# Patient Record
Sex: Male | Born: 1965 | Race: Black or African American | Hispanic: No | Smoking: Never smoker
Health system: Southern US, Community
[De-identification: ages and names within clinical notes are randomized; demographics above are authoritative.]

## PROBLEM LIST (undated history)

## (undated) DIAGNOSIS — N189 Chronic kidney disease, unspecified: Secondary | ICD-10-CM

## (undated) DIAGNOSIS — I10 Essential (primary) hypertension: Secondary | ICD-10-CM

## (undated) DIAGNOSIS — G8929 Other chronic pain: Secondary | ICD-10-CM

## (undated) DIAGNOSIS — E1140 Type 2 diabetes mellitus with diabetic neuropathy, unspecified: Secondary | ICD-10-CM

## (undated) DIAGNOSIS — R011 Cardiac murmur, unspecified: Secondary | ICD-10-CM

## (undated) DIAGNOSIS — E1165 Type 2 diabetes mellitus with hyperglycemia: Secondary | ICD-10-CM

## (undated) DIAGNOSIS — M1990 Unspecified osteoarthritis, unspecified site: Secondary | ICD-10-CM

## (undated) HISTORY — DX: Type 2 diabetes mellitus with hyperglycemia: E11.65

## (undated) HISTORY — DX: Unspecified osteoarthritis, unspecified site: M19.90

## (undated) HISTORY — PX: MANDIBLE FRACTURE SURGERY: SHX706

## (undated) HISTORY — DX: Essential (primary) hypertension: I10

## (undated) HISTORY — DX: Type 2 diabetes mellitus with diabetic neuropathy, unspecified: E11.40

## (undated) HISTORY — DX: Other chronic pain: G89.29

## (undated) HISTORY — PX: PROSTATE SURGERY: SHX751

## (undated) NOTE — *Deleted (*Deleted)
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 * Outpatient ESA/iron orders: Mircera 200 (150 on 10/27) * Last Tsat 29% 05/12/20 - has received 2/5 doses of Venofer 100 per outpt records  * Last doses of Aranesp given on 11/17 * Hgb 8 on 11/18, next dose due 11/24  Ferric citrate 630 TID, phos is 4.0, CoCa 8.8 11/18, no pth   Last HD 11/17 (4h 450, 1900 UF), next 11/19  (had extra session 11/16 d/t shorter sessions from seizures)  Pulmonary Hematology / Oncology PTA Medication Issues Best Practices   A/P  ESRD, Seizure w/ HD - unsure etiology, VSS / slightly hypertensive during HD here, CBGs wnl, EEG neg  Anemia - FOBT +, F/u with colonoscopy (last showed mod hemorrhoids)  - transfuse <7  Hypoglycemia - CBG 62 11/17 PM, continue to monitor - may need to adj basal

---

## 2001-02-24 ENCOUNTER — Emergency Department (HOSPITAL_COMMUNITY): Admission: EM | Admit: 2001-02-24 | Discharge: 2001-02-24 | Payer: Self-pay | Admitting: *Deleted

## 2001-03-01 ENCOUNTER — Emergency Department (HOSPITAL_COMMUNITY): Admission: EM | Admit: 2001-03-01 | Discharge: 2001-03-01 | Payer: Self-pay | Admitting: *Deleted

## 2001-03-02 ENCOUNTER — Encounter (INDEPENDENT_AMBULATORY_CARE_PROVIDER_SITE_OTHER): Payer: Self-pay | Admitting: Specialist

## 2001-03-02 ENCOUNTER — Encounter: Payer: Self-pay | Admitting: Emergency Medicine

## 2001-03-02 ENCOUNTER — Inpatient Hospital Stay (HOSPITAL_COMMUNITY): Admission: EM | Admit: 2001-03-02 | Discharge: 2001-03-12 | Payer: Self-pay | Admitting: Emergency Medicine

## 2001-03-06 ENCOUNTER — Encounter: Payer: Self-pay | Admitting: Family Medicine

## 2001-03-21 ENCOUNTER — Encounter: Admission: RE | Admit: 2001-03-21 | Discharge: 2001-03-21 | Payer: Self-pay | Admitting: Family Medicine

## 2002-09-19 ENCOUNTER — Emergency Department (HOSPITAL_COMMUNITY): Admission: EM | Admit: 2002-09-19 | Discharge: 2002-09-19 | Payer: Self-pay | Admitting: Emergency Medicine

## 2003-01-18 ENCOUNTER — Emergency Department (HOSPITAL_COMMUNITY): Admission: EM | Admit: 2003-01-18 | Discharge: 2003-01-18 | Payer: Self-pay | Admitting: Emergency Medicine

## 2004-02-26 ENCOUNTER — Ambulatory Visit: Payer: Self-pay | Admitting: Family Medicine

## 2004-02-26 ENCOUNTER — Ambulatory Visit: Payer: Self-pay | Admitting: *Deleted

## 2004-10-04 ENCOUNTER — Ambulatory Visit: Payer: Self-pay | Admitting: Family Medicine

## 2005-01-24 ENCOUNTER — Ambulatory Visit: Payer: Self-pay | Admitting: Family Medicine

## 2005-03-21 ENCOUNTER — Ambulatory Visit: Payer: Self-pay | Admitting: Family Medicine

## 2005-04-26 ENCOUNTER — Ambulatory Visit: Payer: Self-pay | Admitting: Family Medicine

## 2005-08-28 ENCOUNTER — Emergency Department (HOSPITAL_COMMUNITY): Admission: EM | Admit: 2005-08-28 | Discharge: 2005-08-29 | Payer: Self-pay | Admitting: Emergency Medicine

## 2005-09-14 ENCOUNTER — Ambulatory Visit: Payer: Self-pay | Admitting: Family Medicine

## 2005-09-19 ENCOUNTER — Ambulatory Visit (HOSPITAL_COMMUNITY): Admission: RE | Admit: 2005-09-19 | Discharge: 2005-09-19 | Payer: Self-pay | Admitting: Family Medicine

## 2006-10-02 ENCOUNTER — Ambulatory Visit: Payer: Self-pay | Admitting: Family Medicine

## 2006-11-14 ENCOUNTER — Ambulatory Visit: Payer: Self-pay | Admitting: Family Medicine

## 2007-03-14 ENCOUNTER — Encounter (INDEPENDENT_AMBULATORY_CARE_PROVIDER_SITE_OTHER): Payer: Self-pay | Admitting: *Deleted

## 2007-09-17 ENCOUNTER — Ambulatory Visit: Payer: Self-pay | Admitting: Family Medicine

## 2007-09-17 LAB — CONVERTED CEMR LAB: Microalb, Ur: 9.8 mg/dL — ABNORMAL HIGH (ref 0.00–1.89)

## 2008-01-03 ENCOUNTER — Ambulatory Visit: Payer: Self-pay | Admitting: Family Medicine

## 2008-03-06 ENCOUNTER — Ambulatory Visit: Payer: Self-pay | Admitting: Family Medicine

## 2008-03-06 LAB — CONVERTED CEMR LAB
ALT: 14 units/L (ref 0–53)
AST: 15 units/L (ref 0–37)
Albumin: 4.4 g/dL (ref 3.5–5.2)
Alkaline Phosphatase: 65 units/L (ref 39–117)
BUN: 14 mg/dL (ref 6–23)
Basophils Absolute: 0 10*3/uL (ref 0.0–0.1)
Basophils Relative: 1 % (ref 0–1)
CO2: 25 meq/L (ref 19–32)
Calcium: 9.7 mg/dL (ref 8.4–10.5)
Chloride: 104 meq/L (ref 96–112)
Cholesterol: 202 mg/dL — ABNORMAL HIGH (ref 0–200)
Creatinine, Ser: 1.21 mg/dL (ref 0.40–1.50)
Eosinophils Absolute: 0 10*3/uL (ref 0.0–0.7)
Eosinophils Relative: 1 % (ref 0–5)
Glucose, Bld: 144 mg/dL — ABNORMAL HIGH (ref 70–99)
HCT: 41.7 % (ref 39.0–52.0)
HDL: 50 mg/dL (ref >39)
Hemoglobin: 12.9 g/dL — ABNORMAL LOW (ref 13.0–17.0)
LDL Cholesterol: 138 mg/dL — ABNORMAL HIGH (ref 0–99)
Lymphocytes Relative: 31 % (ref 12–46)
Lymphs Abs: 1.7 10*3/uL (ref 0.7–4.0)
MCHC: 30.9 g/dL (ref 30.0–36.0)
MCV: 74.3 fL — ABNORMAL LOW (ref 78.0–100.0)
Monocytes Absolute: 0.7 10*3/uL (ref 0.1–1.0)
Monocytes Relative: 13 % — ABNORMAL HIGH (ref 3–12)
Neutro Abs: 3.1 10*3/uL (ref 1.7–7.7)
Neutrophils Relative %: 55 % (ref 43–77)
PSA: 2.22 ng/mL (ref 0.10–4.00)
Platelets: 259 10*3/uL (ref 150–400)
Potassium: 4.4 meq/L (ref 3.5–5.3)
RBC: 5.61 M/uL (ref 4.22–5.81)
RDW: 14.6 % (ref 11.5–15.5)
Sodium: 140 meq/L (ref 135–145)
Total Bilirubin: 0.3 mg/dL (ref 0.3–1.2)
Total CHOL/HDL Ratio: 4
Total Protein: 7.4 g/dL (ref 6.0–8.3)
Triglycerides: 68 mg/dL (ref <150)
VLDL: 14 mg/dL (ref 0–40)
Vit D, 1,25-Dihydroxy: 24 — ABNORMAL LOW (ref 30–89)
WBC: 5.5 10*3/uL (ref 4.0–10.5)

## 2010-07-29 NOTE — Miscellaneous (Signed)
Summary: VIP  Patient: Jeremiah [name] Note: All result statuses are Final unless otherwise noted.  Tests: (1) VIP (Medications)   LLIMPORTMEDS              "Result Below..."       RESULT: NEURONTIN CAPS 400 MG*TAKE ONE CAPSULE IN THE MORNING AND TWO AT BEDTIME*11/14/2006*Last Refill: Unknown*[id number]*******   LLIMPORTMEDS              "Result Below..."       RESULT: LANTUS SOLN 100 UNIT/ML*INJECT 50 UNITS UNDER SKIN TWO TIMES A DAY*10/02/2006*Last Refill: 12/22/2006*[id number]*******   LLIMPORTMEDS              "Result Below..."       RESULT: GLUCOPHAGE TABS 1000 MG*TAKE ONE TABLET TWICE DAILY WITH MEALS*10/02/2006*Last Refill: 12/22/2006*[id number]*******   LLIMPORTMEDS              "Result Below..."       RESULT: ACTOS TABS 45 MG*TAKE ONE TABLET ONCE A DAY WITH A MEAL*10/02/2006*Last Refill: 12/22/2006*[id number]*******   LLIMPORTALLS              NKDA***  Note: An exclamation mark (!) indicates a result that was not dispersed into the flowsheet. Document Creation Date: 04/26/2007 2:59 PM _______________________________________________________________________  (1) Order result status: Final Collection or observation date-time: 03/14/2007 Requested date-time: 03/14/2007 Receipt date-time:  Reported date-time: 03/14/2007 Referring Physician:   Ordering Physician:   Specimen Source:  Source: Charlyne Mom Order Number:  Lab site:

## 2010-11-12 NOTE — Consult Note (Signed)
[place]. [facility]  Patient:    Jeremiah [name], Jeremiah [name] Visit Number: [id number] MRN: [id number]          Service Type: EMS Location: [facility] Attending Physician:  Burnard Hawthorne Dictated by:   Lucina Mellow. Terance Hart, M.D. Proc. Date: 03/07/01 Admit Date:  03/01/2001 Discharge Date: 03/01/2001   CC:         Lauro Regulus, M.D.   Consultation Report  REASON FOR CONSULTATION:  This 45 year old black male was diagnosed in the last year as having diabetes.  He has now had several days of some chills, fever, perineal pain, and some pain with voiding.  He denies any previous voiding problems or prostatitis.  He has been treated with Septra DS and subsequently been on Ancef in the hospital but has just been switched to Tequin 400 mg a day this afternoon.  He was also complaining about perineal pain and tenderness.  He had a blood culture on March 02, 2001 that showed Staphylococcus aureus sensitive to Levaquin and therefore should be sensitive to Tequin.  It also showed some sensitivity to Septra.  Urine culture grew Staphylococcus aureus with a similar sensitivities.  That was on March 02, 2001.  Urinalysis on that same day just showed 3 to 6 white cells.  In the last few days, his temperatures have been no higher than 99.6.  A CT scan yesterday showed an enlarged prostate with some suspected abscess cavities within it.  PHYSICAL EXAMINATION:  ABDOMEN:  The abdomen is soft and nontender.  GENITALIA:  Penis, urethral meatus, testicles, and epididymis are unremarkable.  There are no perineal masses.  RECTAL:  Reveals an enlarged and tender prostate.  I cannot really feel fluctuants.  IMPRESSION:  Severe prostatitis with suspected abscess formation.  He is now on appropriate antibiotics.  The question is whether he will need drainage of these abscesses and if so probably transurethral approach would be most appropriate.  I will investigate this option  further and follow this gentleman with you. Dictated by:   Lucina Mellow. Terance Hart, M.D. Attending Physician:  Burnard Hawthorne DD:  03/07/01 TD:  03/07/01 Job: [id number]

## 2010-11-12 NOTE — Discharge Summary (Signed)
[place]. [facility]  Patient:    Jeremiah [name], Jeremiah [name] Visit Number: [id number] MRN: [id number]          Service Type: MED Location: [phone] Attending Physician:  Fayrene Fearing Dictated by:   Ardine Eng, M.D. Admit Date:  03/02/2001 Disc. Date: 03/12/01   CC:         Gay Filler. Leward Quan, M.D., [facility]   Discharge Summary  DISCHARGE DIAGNOSIS:  Prostate abscess and bacteremia with Staphylococcus aureus.  SECONDARY DIAGNOSIS:  Diabetes mellitus.  PROCEDURES: 1. The patient had blood cultures x 2 done on March 02, 2001 which both    were positive for Staphylococcus aureus.  The patient also had a urine    culture done on March 02, 2001 which was no growth. 2. The patient had a chest x-ray done on March 02, 2001 which showed no    acute cardiopulmonary disease. 3. The patient had a CT of his abdomen and pelvis with contrast medium on    March 06, 2001 which showed constipation.  Otherwise, negative    abdominal CT.  Pelvic CT showed extensive abscess of the prostate gland. 4. The patient had a transurethral resection incision of the prostate done on    March 09, 2001 without any postoperative or intraoperative    complications.  This procedure was done by Dr. Terance Hart from urology. 5. The patient had a left knee aspiration done on March 05, 2001 which    showed minimal fluid return and was then therefore not sent for any tests. 6. The patient had a transthoracic echocardiogram done on March 06, 2001    which showed:    a. Left systolic function normal.    b. Ejection fraction of 55-65%.    c. No left ventricular region wall motion abnormalities.    d. No valvular vegetations. 7. The patient had a transesophageal echocardiogram done on March 07, 2001    which showed:    a. Aortic valve minimally thickened, no obvious vegetation, trace aortic       insufficiency.    b. Normal thoracic aorta.    c. There  was no significant mitral valvular regurgitation.    d. Left atrial appendage size was normal, no left atrial appendage       thrombus, no left atrial thrombus.    e. No pulmonic valve stenosis, no significant pulmonic regurgitation.    f. There was trivial tricuspid valvular regurgitation.  CONSULTATIONS:  The patient was seen by Dr. Terance Hart from urology.  The patient was also see by Dr. Johnnye Sima from infectious disease.  HOSPITAL COURSE:  This is a 45 year old African-American male who presented with a history of 10 days of fevers and chills that were worse at night.  The patient was seen in the emergency department five days and three days prior to admission with similar complaints.  Tests from those visits showed a positive blood culture x 1 with Staphylococcus aureus.  The patient also had complaints of left knee pain and rectal pain.  While the patient was in the hospital, he was worked up with repeat blood cultures and urine cultures which showed Staphylococcus aureus in both blood cultures but no growth in the urine cultures.  He was empirically started on IV antibiotics including Ancef and later was started on oral Tequin.  The patient then underwent a CT of the abdomen and pelvis with IV contrast showing a prostatic abscess.  Dr. Terance Hart from urology was then consulted, which  did a transurethral resection incision of the prostate, as above.  The patient reported immediate relief of his perineal pain.  It yielded minimal fluid. Postoperatively, the patient remained well with minimal complaints of pain or urinary complications.  The patient had adequate urine output after his Foley was removed.  The patient remained afebrile throughout his postoperative course and was tolerating p.o. well with stable vital signs.  He was then discharged to home with follow-up as below.  DISCHARGE MEDICATIONS: 1. Keflex 500 mg p.o. q.i.d. x three weeks. 2. Flomax 0.4 mg p.o. q.d. 3. Tylenol  No.3 one to two tablets p.o. q.4h. p.r.n. pain. 4. Lantus injected 20 units subcu q.h.s. 5. Glucophage 500 mg p.o. b.i.d. 6. Glucotrol XL 10 mg p.o. q.d.  ACTIVITY:  The patient may return to work on March 28, 2001 with light activity until then.  The patient was told not to have sex for three weeks.  DIET:  He is to told avoid sweets and foods with high sugars.  He is also told to avoid fat, fried, fast foods.  SPECIAL INSTRUCTIONS:  He was instructed to call his doctor or return to the hospital for inability to urinate, fever, chest pain, shortness of breath, blood in the urine, or for blood sugars less than 70 or greater than 250.  FOLLOW-UP:  He is to return to [facility], on March 13, 2001, at 8 a.m. for recertification.  He was also made an appointment to see Dr. Leward Quan at [facility] on March 14, 2001 at 9:45 a.m.  He was told to call [phone] if he could not make either of those appointments.  He was also made an appointment to see Dr. Terance Hart on October 1 at 4 p.m.  He is to call his office at [phone] if he cannot make that appointment. Dictated by:   Ardine Eng, M.D. Attending Physician:  Fayrene Fearing DD:  03/12/01 TD:  03/12/01 Job: [id number]

## 2010-11-12 NOTE — H&P (Signed)
[place]. [facility]  Patient:    Jeremiah [name], Jeremiah [name] Visit Number: [id number] MRN: [id number]          Service Type: EMS Location: [facility] Attending Physician:  Burnard Hawthorne Dictated by:   Lauro Regulus, M.D. Admit Date:  03/01/2001 Discharge Date: 03/01/2001                           History and Physical  SERVICE:  [facility].  CHIEF COMPLAINT:  Fever.  HISTORY OF PRESENT ILLNESS:  Mr. Jeremiah [name] is a 45 year old patient of [place] who presents to [facility] emergency department after being asked to come in with a 10-day history of fever and chills that is worse at night.  He was seen in the emergency department five years ago and two days ago.  At his last visit he had blood cultures drawn that grew gram positive cocci in clusters in 1/1 blood cultures.  He had been started on Septra DS five days ago - he has been taking this.  He does complain of testicular pain, rectal pain, and left knee pain that started five days ago after the fevers.  He denies any cough or sputum.  No chest pain, no nausea or vomiting.  He denies any trauma to his knees.  He denies previous prostatitis.  He has had gonorrhea in the past.  He denies any IV drug abuse.  He denies a history of endocarditis.  He denies any recent pharyngitis, ear pain, or sinus pain.  He denies previous history of fevers.  PAST MEDICAL HISTORY:  Diabetes mellitus type 2.  MEDICATIONS: 1. Glucotrol XL 10 mg q.d. 2. Glucophage - dose unknown.  ALLERGIES:  No known drug allergies.  SOCIAL HISTORY:  Patient lives in [place].  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  He denies alcohol abuse.  He does smoke two packs of cigarettes a day.  FAMILY HISTORY:  Noncontributory.  REVIEW OF SYSTEMS:  He does complain of a seven-pound weight loss over the past month and he states he has had decreased appetite.  No chest pain, no cough, no sputum.  No nausea, vomiting,  or diarrhea.  No skin rash.  No headaches.  No history of sickle cell disease.  No blurry vision.  No sore throat or sinus pain.  No dysuria, no urinary frequency.  PHYSICAL EXAMINATION:  VITAL SIGNS:  Noted.  GENERAL:  Pleasant, well-developed, well-nourished male in no distress.  He is alert and oriented x 3.  HEENT:  Scalp without nodules.  PERRL, EOMI.  Nose without discharge. Oropharynx nonerythematous, no exudate.  No tonsil hypertrophy.  NECK:  No lymphadenopathy, no thyromegaly.  CORONARY:  S1, S2, regular rate and rhythm.  There is a 2/6 systolic murmur heard best at the left lower sternal border without radiation.  LUNGS:  Clear to auscultation bilaterally.  ABDOMEN:  Soft, nontender, nondistended, with bowel sounds.  GENITOURINARY:  Testicular exam reveals no testicular masses noted, no tenderness.  There is tenderness in the perineal region.  No erythema, no edema.  RECTAL:  Prostate is mildly tender, normal size.  Stools guaiac negative.  EXTREMITIES:  Left knee with a trace effusion noted anteriorly.  He does have full AROM of his left knee.  Ligaments are stable.  He is tender over the prepatellar region.  There is no erythema or warmth.  LABORATORY DATA:  March 02, 2001 - blood cultures 1/1 gram positive  cocci in clusters.  Labs that are pending include a CBC with diff, CMP, UA, urine culture, and chest x-ray.  ASSESSMENT: 1. Fevers with gram positive bacteremia, likely Staphylococcus species.  There    is no obvious source.  Consider endocarditis with heart murmur.  Also    consider spread from prostatitis or possible left knee infection.  Will    check blood cultures, urine cultures, chest x-ray, and CBC.  Will start    patient on IV vancomycin 1 g q.12h. for possible staphylococcal    endocarditis.  In addition, will treat with IV Cipro for prostatitis. 2. Prostatitis.  Check urinalysis and urine cultures.  Start IV Cipro. 3. Left knee pain, likely  prepatellar bursitis.  Septic joint is unlikely    given the patients full range of motion.  However, if he has effusion    or increased pain consider arthrocentesis for joint analysis. 4. Diabetes type 2.  Will continue Glucotrol XL at 10 mg q.d.  Hold    Glucophage.  Check CBGs q.a.c. and q.h.s.  ADA diet. Dictated by:   Lauro Regulus, M.D. Attending Physician:  Burnard Hawthorne DD:  03/02/01 TD:  03/03/01 Job: [id number]

## 2010-11-12 NOTE — Op Note (Signed)
[place]. [facility]  Patient:    Jeremiah [name], Jeremiah [name] Visit Number: [id number] MRN: [id number]          Service Type: MED Location: [phone] Attending Physician:  Fayrene Fearing Proc. Date: 03/09/01 Admit Date:  03/02/2001                             Operative Report  PREOPERATIVE DIAGNOSIS:  Prostatic abscess.  POSTOPERATIVE DIAGNOSIS:  Prostatic abscess.  OPERATION PERFORMED:  Cystoscopy and transurethral resection incision of prostate.  SURGEON:  Lucina Mellow. Terance Hart, M.D.  ANESTHESIA:  General.  INDICATIONS FOR PROCEDURE:  This 45 year old black male has had over two weeks of onset of some chills and fever and trouble voiding and fairly significant perineal pain which has not responded well to Septra, Ancef and Levaquin.  He was admitted to medical service and had an abdominal and pelvic CT scan that showed prostate lobes filled with abnormal low density with rim enhancement compatible with prostate abscess formation.  Because of failure to respond to medical therapy the situation was discussed with my partners we discussed the option of transperineal aspiration with question of ability to aspirate adequately and drain an abscess adequately was a concern.  Option chosen was transurethral incisional drainage as an approach.  The patient was advised about the risks of retrograde ejaculation or fertility problems but fertility problems may already be a problem with a significant prostatic abscess.  He has not had any erections at all since the onset of this  disease process.  DESCRIPTION OF PROCEDURE:  The patient was brought to the operating room and placed in lithotomy position.  External genitalia were prepped and draped in the usual fashion.  He was cystoscoped.  The anterior urethra was unremarkable.  Prostatic mucosa was inflamed.  The bladder had no stones, tumors or inflammatory lesions.  I made a small incision in the left lobe  of the prostate and did not encounter a cavity of purulent material.  I then made a similar small cut into the right lobe of the prostate and no obvious purulent drainage occurred, so I did a rectal examination and there was definite soft area compatible with a somewhat decompressed abscess cavity. With some digital pressure, I made one more incision into the right lobe of the prostate and still did not obtain any purulent material and with the scope in place with the 24 French resectoscope sheath in place, which had been inserted after sounding him to 63 [place], I could not get any significant amount of purulent material to drain.  The abscess cavity felt decompressed and I was concerned about making any deeper incision and at this point coagulated a couple of small bleeders and inserted a 22 French  5 cc Foley catheter to closed drainage and he was taken to recovery room in good condition. Attending Physician:  Fayrene Fearing DD:  03/09/01 TD:  03/09/01 Job: [id number]

## 2011-05-06 ENCOUNTER — Emergency Department (HOSPITAL_COMMUNITY): Payer: Self-pay

## 2011-05-06 ENCOUNTER — Encounter: Payer: Self-pay | Admitting: Emergency Medicine

## 2011-05-06 ENCOUNTER — Inpatient Hospital Stay (HOSPITAL_COMMUNITY)
Admission: EM | Admit: 2011-05-06 | Discharge: 2011-05-09 | DRG: 638 | Disposition: A | Discharge status: Home / Self Care | Payer: Self-pay | Attending: Internal Medicine | Admitting: Internal Medicine

## 2011-05-06 DIAGNOSIS — R109 Unspecified abdominal pain: Secondary | ICD-10-CM | POA: Diagnosis present

## 2011-05-06 DIAGNOSIS — E1100 Type 2 diabetes mellitus with hyperosmolarity without nonketotic hyperglycemic-hyperosmolar coma (NKHHC): Principal | ICD-10-CM | POA: Diagnosis present

## 2011-05-06 DIAGNOSIS — E119 Type 2 diabetes mellitus without complications: Secondary | ICD-10-CM

## 2011-05-06 DIAGNOSIS — E1165 Type 2 diabetes mellitus with hyperglycemia: Secondary | ICD-10-CM | POA: Diagnosis present

## 2011-05-06 DIAGNOSIS — E1300 Other specified diabetes mellitus with hyperosmolarity without nonketotic hyperglycemic-hyperosmolar coma (NKHHC): Secondary | ICD-10-CM

## 2011-05-06 DIAGNOSIS — E871 Hypo-osmolality and hyponatremia: Secondary | ICD-10-CM | POA: Diagnosis present

## 2011-05-06 DIAGNOSIS — M79609 Pain in unspecified limb: Secondary | ICD-10-CM | POA: Diagnosis present

## 2011-05-06 DIAGNOSIS — IMO0002 Reserved for concepts with insufficient information to code with codable children: Secondary | ICD-10-CM | POA: Diagnosis present

## 2011-05-06 LAB — POCT I-STAT 3, VENOUS BLOOD GAS (G3P V)
Acid-Base Excess: 3 mmol/L — ABNORMAL HIGH (ref 0.0–2.0)
Bicarbonate: 27.8 mEq/L — ABNORMAL HIGH (ref 20.0–24.0)
O2 Saturation: 53 %
Patient temperature: 97.7
TCO2: 29 mmol/L (ref 0–100)
pCO2, Ven: 42.1 mmHg — ABNORMAL LOW (ref 45.0–50.0)
pH, Ven: 7.426 — ABNORMAL HIGH (ref 7.250–7.300)
pO2, Ven: 27 mmHg — CL (ref 30.0–45.0)

## 2011-05-06 LAB — CBC
HCT: 37 % — ABNORMAL LOW (ref 39.0–52.0)
HCT: 38.4 % — ABNORMAL LOW (ref 39.0–52.0)
Hemoglobin: 11.7 g/dL — ABNORMAL LOW (ref 13.0–17.0)
Hemoglobin: 12.7 g/dL — ABNORMAL LOW (ref 13.0–17.0)
MCH: 22.6 pg — ABNORMAL LOW (ref 26.0–34.0)
MCH: 23.7 pg — ABNORMAL LOW (ref 26.0–34.0)
MCHC: 31.6 g/dL (ref 30.0–36.0)
MCHC: 33.1 g/dL (ref 30.0–36.0)
MCV: 71.6 fL — ABNORMAL LOW (ref 78.0–100.0)
MCV: 71.8 fL — ABNORMAL LOW (ref 78.0–100.0)
Platelets: 247 10*3/uL (ref 150–400)
Platelets: 242 10*3/uL (ref 150–400)
RBC: 5.17 MIL/uL (ref 4.22–5.81)
RBC: 5.35 MIL/uL (ref 4.22–5.81)
RDW: 13.4 % (ref 11.5–15.5)
RDW: 13.4 % (ref 11.5–15.5)
WBC: 4.1 10*3/uL (ref 4.0–10.5)
WBC: 4.2 10*3/uL (ref 4.0–10.5)

## 2011-05-06 LAB — HEMOGLOBIN A1C
Hgb A1c MFr Bld: 15.8 % — ABNORMAL HIGH (ref <5.7)
Mean Plasma Glucose: 407 mg/dL — ABNORMAL HIGH (ref <117)

## 2011-05-06 LAB — GLUCOSE, CAPILLARY
Glucose-Capillary: >600 mg/dL (ref 70–99)
Glucose-Capillary: 483 mg/dL — ABNORMAL HIGH (ref 70–99)
Glucose-Capillary: 334 mg/dL — ABNORMAL HIGH (ref 70–99)
Glucose-Capillary: 67 mg/dL — ABNORMAL LOW (ref 70–99)
Glucose-Capillary: 516 mg/dL — ABNORMAL HIGH (ref 70–99)

## 2011-05-06 LAB — CREATININE, SERUM
Creatinine, Ser: 1.01 mg/dL (ref 0.50–1.35)
GFR calc Af Amer: >90 mL/min (ref >90)
GFR calc non Af Amer: 88 mL/min — ABNORMAL LOW (ref >90)

## 2011-05-06 LAB — URINE MICROSCOPIC-ADD ON

## 2011-05-06 LAB — BASIC METABOLIC PANEL
BUN: 20 mg/dL (ref 6–23)
CO2: 25 mEq/L (ref 19–32)
Calcium: 9.9 mg/dL (ref 8.4–10.5)
Chloride: 91 mEq/L — ABNORMAL LOW (ref 96–112)
Creatinine, Ser: 1.11 mg/dL (ref 0.50–1.35)
GFR calc Af Amer: >90 mL/min (ref >90)
GFR calc non Af Amer: 79 mL/min — ABNORMAL LOW (ref >90)
Glucose, Bld: 665 mg/dL (ref 70–99)
Potassium: 5 mEq/L (ref 3.5–5.1)
Sodium: 128 mEq/L — ABNORMAL LOW (ref 135–145)

## 2011-05-06 LAB — URINALYSIS, ROUTINE W REFLEX MICROSCOPIC
Bilirubin Urine: NEGATIVE
Glucose, UA: >1000 mg/dL — AB
Hgb urine dipstick: NEGATIVE
Ketones, ur: NEGATIVE mg/dL
Leukocytes, UA: NEGATIVE
Nitrite: NEGATIVE
Protein, ur: NEGATIVE mg/dL
Specific Gravity, Urine: 1.031 — ABNORMAL HIGH (ref 1.005–1.030)
Urobilinogen, UA: 0.2 mg/dL (ref 0.0–1.0)
pH: 6 (ref 5.0–8.0)

## 2011-05-06 LAB — DIFFERENTIAL
Basophils Absolute: 0 10*3/uL (ref 0.0–0.1)
Basophils Relative: 1 % (ref 0–1)
Eosinophils Absolute: 0 10*3/uL (ref 0.0–0.7)
Eosinophils Relative: 0 % (ref 0–5)
Lymphocytes Relative: 25 % (ref 12–46)
Lymphs Abs: 1 10*3/uL (ref 0.7–4.0)
Monocytes Absolute: 0.4 10*3/uL (ref 0.1–1.0)
Monocytes Relative: 9 % (ref 3–12)
Neutro Abs: 2.7 10*3/uL (ref 1.7–7.7)
Neutrophils Relative %: 65 % (ref 43–77)

## 2011-05-06 LAB — TSH: TSH: 2.112 u[IU]/mL (ref 0.350–4.500)

## 2011-05-06 LAB — MAGNESIUM: Magnesium: 2.2 mg/dL (ref 1.5–2.5)

## 2011-05-06 MED ORDER — SODIUM CHLORIDE 0.9 % IV SOLN
INTRAVENOUS | Status: DC
  Administered 2011-05-06: 15:00:00 via INTRAVENOUS

## 2011-05-06 MED ORDER — DEXTROSE 50 % IV SOLN
25.0000 mL | Freq: Once | INTRAVENOUS | Status: DC
  Filled 2011-05-06: qty 50

## 2011-05-06 MED ORDER — BISACODYL 5 MG PO TBEC
10.0000 mg | DELAYED_RELEASE_TABLET | Freq: Once | ORAL | Status: AC
  Administered 2011-05-06: 10 mg via ORAL
  Filled 2011-05-06: qty 2

## 2011-05-06 MED ORDER — HYDROCODONE-ACETAMINOPHEN 5-325 MG PO TABS
1.0000 | ORAL_TABLET | Freq: Once | ORAL | Status: AC
Start: 2011-05-06 — End: 2011-05-06
  Administered 2011-05-06: 1 via ORAL
  Filled 2011-05-06: qty 1

## 2011-05-06 MED ORDER — SODIUM CHLORIDE 0.9 % IV SOLN
INTRAVENOUS | Status: AC
  Administered 2011-05-06: 21:00:00 via INTRAVENOUS

## 2011-05-06 MED ORDER — ALBUTEROL SULFATE (5 MG/ML) 0.5% IN NEBU: 2.5000 mg | INHALATION_SOLUTION | RESPIRATORY_TRACT | Status: DC | PRN

## 2011-05-06 MED ORDER — HYDROCODONE-ACETAMINOPHEN 5-325 MG PO TABS
1.0000 | ORAL_TABLET | ORAL | Status: DC | PRN
  Administered 2011-05-06 – 2011-05-07 (×2): 1 via ORAL
  Filled 2011-05-06 (×2): qty 1

## 2011-05-06 MED ORDER — INSULIN ASPART 100 UNIT/ML ~~LOC~~ SOLN
0.0000 [IU] | Freq: Three times a day (TID) | SUBCUTANEOUS | Status: DC
  Filled 2011-05-06: qty 3

## 2011-05-06 MED ORDER — BISACODYL 10 MG RE SUPP
10.0000 mg | Freq: Every day | RECTAL | Status: DC | PRN
  Administered 2011-05-06: 10 mg via RECTAL

## 2011-05-06 MED ORDER — INSULIN ASPART 100 UNIT/ML ~~LOC~~ SOLN: 0.0000 [IU] | Freq: Three times a day (TID) | SUBCUTANEOUS | Status: DC

## 2011-05-06 MED ORDER — BISACODYL 10 MG RE SUPP
10.0000 mg | Freq: Once | RECTAL | Status: AC
  Administered 2011-05-06: 10 mg via RECTAL
  Filled 2011-05-06: qty 1

## 2011-05-06 MED ORDER — HEPARIN SODIUM (PORCINE) 5000 UNIT/ML IJ SOLN
5000.0000 [IU] | Freq: Three times a day (TID) | INTRAMUSCULAR | Status: DC
  Administered 2011-05-06 – 2011-05-09 (×7): 5000 [IU] via SUBCUTANEOUS
  Filled 2011-05-06 (×12): qty 1

## 2011-05-06 MED ORDER — SENNOSIDES-DOCUSATE SODIUM 8.6-50 MG PO TABS: 1.0000 | ORAL_TABLET | Freq: Every day | ORAL | Status: DC | PRN

## 2011-05-06 MED ORDER — SODIUM CHLORIDE 0.9 % IV BOLUS (SEPSIS)
1000.0000 mL | Freq: Once | INTRAVENOUS | Status: AC
  Administered 2011-05-06: 1000 mL via INTRAVENOUS

## 2011-05-06 MED ORDER — INSULIN ASPART 100 UNIT/ML ~~LOC~~ SOLN
10.0000 [IU] | Freq: Once | SUBCUTANEOUS | Status: AC
  Administered 2011-05-06: 100 [IU] via SUBCUTANEOUS

## 2011-05-06 MED ORDER — SODIUM CHLORIDE 0.9 % IV SOLN
Freq: Once | INTRAVENOUS | Status: AC
  Administered 2011-05-06: 10:00:00 via INTRAVENOUS

## 2011-05-06 MED ORDER — INSULIN GLARGINE 100 UNIT/ML ~~LOC~~ SOLN
12.0000 [IU] | SUBCUTANEOUS | Status: DC
  Filled 2011-05-06: qty 3

## 2011-05-06 MED ORDER — INSULIN REGULAR HUMAN 100 UNIT/ML IJ SOLN: 10.0000 [IU] | Freq: Once | INTRAMUSCULAR | Status: DC

## 2011-05-06 NOTE — ED Notes (Signed)
Onset 7 days ago RLQ pain intermittent denies n/v/d.

## 2011-05-06 NOTE — ED Notes (Signed)
Onset one week ago RLQ abdominal pain intermittent currently pain 10/10 achy sharp denies n/v/d. Last bowel movement normal formed 2 days ago.  States one week ago left 5th finger tingling skin warm cap refill less then 3 seconds radial pulses +2 bilateral.  Few months ago bilateral feet pain achy with right 5th toe pain 10/10 achy sore.  Pedal pulses +2 bilateral. Patient states takes insulin for diabetes however ran out of medication for a while and have been using his wifes.

## 2011-05-06 NOTE — ED Notes (Signed)
Nurse- greg/rn notified of cbg results.

## 2011-05-06 NOTE — H&P (Addendum)
Jt Haefs [id number] Outpatient Primary MD for the patient is No primary provider on file.  With History of -  Past Medical History  Diagnosis Date  . Diabetes mellitus      Past Surgical History  Procedure Date  . Mandible fracture surgery   H@   in for   Chief Complaint  Patient presents with  . Abdominal Pain      HPI  Jeremiah [name] [id number] is a 45 y.o. male, who has 83yr H/O DM-2, ran out of his Insulin few months ago, comes in for mild RLQ dull constant non radiating Abd pain for 10 day duration, no associated, no +/- factors, in ER glucose >600, no ketones in urine, Hyerkalemia and Hyponatremia noted and I was called to admit the patient.    Review of Systems    In addition to the HPI above,  No Fever-chills, No Headache, No changes with Vision or hearing, No problems swallowing food or Liquids, No Chest pain, Cough or Shortness of Breath, As above Abdominal pain, No Nausea or Vommitting,  No Blood in stool or Urine, No dysuria, No new skin rashes or bruises, No new joints pains-aches,  No new weakness, tingling, numbness in any extremity, No recent weight gain or loss, No polyuria, polydypsia or polyphagia, No significant Mental Stressors.  A full 10 point Review of Systems was done, except as stated above, all other Review of Systems were negative.   Social History History  Substance Use Topics  . Smoking status: Never Smoker   . Smokeless tobacco: Not on file  . Alcohol Use: No      Family History Family History  Problem Relation Age of Onset  . Diabetes Mother       Prior to Admission medications   Medication Sig Start Date End Date Taking? Authorizing Provider  insulin glargine (LANTUS) 100 UNIT/ML injection Inject 50 Units into the skin at bedtime.     Yes Historical Provider, MD   No Known Allergies  Physical Exam No intake or output data in the 24 hours ending 05/06/11 1406 1. Blood  pressure 118/64, pulse 89, temperature 97.7 F (36.5 C), temperature source Oral, resp. rate 20, SpO2 99.00%. General thin african [place] male lying in bed in NAD,    2. Normal affect and insight, Not Suicidal or Homicidal, Awake Alert, Oriented *3.  3. No F.N deficits, ALL C.Nerves Intact, Strength 5/5 all 4 extremities, Sensation intact all 4 extremities, Plantars down going.  4. Ears and Eyes appear Normal, Conjunctivae clear, PERRLA. Moist Oral Mucosa.  5. Supple Neck, No JVD, No cervical lymphadenopathy appriciated, No Carotid Bruits.  6. Symmetrical Chest wall movement, Good air movement bilaterally, CTAB.  7. RRR, No Gallops, Rubs or Murmurs, No Parasternal Heave.  8. Positive Bowel Sounds, Abdomen Soft, Non tender,No rebound -guarding or rigidity.  9. No Cyanosis, Normal Skin Turgor, No Skin Rash or Bruise.  10. Good muscle tone,  joints appear normal , no effusions, Normal ROM.  11. No Palpable Lymph Nodes in Neck or Axillae      Data Review  CBC w Diff:  Lab Results  Component Value Date   WBC 4.1 05/06/2011   HGB 11.7* 05/06/2011   HCT 37.0* 05/06/2011   PLT 247 05/06/2011   LYMPHOPCT 25 05/06/2011   MONOPCT 9 05/06/2011   EOSPCT 0 05/06/2011   BASOPCT 1 05/06/2011    CMP:  Lab Results  Component Value Date   NA 128* 05/06/2011   K 5.0 05/06/2011  CL 91* 05/06/2011   CO2 25 05/06/2011   BUN 20 05/06/2011   CREATININE 1.11 05/06/2011   PROT 7.4 03/06/2008   ALBUMIN 4.4 03/06/2008   BILITOT 0.3 03/06/2008   ALKPHOS 65 03/06/2008   AST 15 03/06/2008   ALT 14 03/06/2008   Dg Chest 2 View  05/06/2011  *RADIOLOGY REPORT*  Clinical Data: Right side abdominal pain, diabetes  CHEST - 2 VIEW  Comparison: None  Findings: Normal heart size, mediastinal contours, and pulmonary vascularity. Lungs clear. No pleural effusion or pneumothorax. Question left nipple shadow versus nodule. No acute osseous findings.  IMPRESSION: Question left nipple shadow; recommend repeat PA chest  radiograph with nipple markers to exclude pulmonary nodule. Otherwise negative exam.  Original Report Authenticated By: Burnetta Sabin, M.D.   Dg Abd 1 View  05/06/2011  *RADIOLOGY REPORT*  Clinical Data: Right lower abdomen pain  ABDOMEN - 1 VIEW  Comparison: None  Findings: Mildly prominent stool throughout colon. No bowel dilatation bowel wall thickening or evidence of obstruction. Bones unremarkable. Small right pelvic phleboliths. No definite urinary tract calcification.  IMPRESSION: Mildly prominent stool throughout colon.  Original Report Authenticated By: Burnetta Sabin, M.D.    Assessment & Plan  1. Hyperosmolar state due to Uncontrolled DM-2 in a patient who is out of Insulin for months - admit, gentle IVF, Lantus + gentle ISS, already sugars are in 360s now post IVF and 10 units of Insulin given in ER, will put on NS 100cc/hr X 15 hrs, Lantus 12 units + low dose ISS, check A1c, monitor lytes.  2. RLQ Abd Pain - with stable exam, no WBC, no fever, KUB only stool in colon, last BM 3 days ago, likely reason high sugars as above + constipation - stool softners, treat DM as above, monitor.   IV Morphine for pain control DVT Prophylaxis   Heparin AM Labs Ordered, also please review Full Orders Admission, patients condition and plan of care including tests being ordered have been discussed with the patient   who indicates understanding and agree with the plan and Code Status. Code Status Full  Condition fair The patient is being admitted to the Hospitalist Service at [facility].

## 2011-05-06 NOTE — ED Notes (Signed)
Patient is resting comfortably. 

## 2011-05-06 NOTE — ED Notes (Signed)
ED MD informed of glucose

## 2011-05-06 NOTE — ED Provider Notes (Signed)
History     CSN: [id number] Arrival date & time: 05/06/2011  7:55 AM   First MD Initiated Contact with Patient 05/06/11 [phone]      Chief Complaint  Patient presents with  . Abdominal Pain    (Consider location/radiation/quality/duration/timing/severity/associated sxs/prior treatment) HPI Comments: Patient also reports that he is diabetic and has not been taking insulin for the past year.  He uses his wife's insulin on occasion, but is unsure of the dose.  He is a patient at [facility] and reports that he is unable to get the insulin from there. He is also complaining of pain in both of his feet.  This is a constant pain.  He describes the pain as sharp.   He is also complaining of intermittent pain in his right testicles.  He reports that it only last a few minutes at a time.  No hematuria, penile discharge, or fevers.  Patient is a 45 y.o. male presenting with abdominal pain. The history is provided by the patient.  Abdominal Pain The primary symptoms of the illness include abdominal pain. The primary symptoms of the illness do not include fever, fatigue, shortness of breath, nausea, vomiting, diarrhea, hematemesis, hematochezia or dysuria. Episode onset: One week ago. The onset of the illness was gradual. The problem has not changed since onset. The patient has not had a change in bowel habit. Symptoms associated with the illness do not include chills, anorexia, diaphoresis, heartburn, constipation, urgency, hematuria, frequency or back pain. Significant associated medical issues include diabetes.    Past Medical History  Diagnosis Date  . Diabetes mellitus     Past Surgical History  Procedure Date  . Mandible fracture surgery     Family History  Problem Relation Age of Onset  . Diabetes Mother     History  Substance Use Topics  . Smoking status: Never Smoker   . Smokeless tobacco: Not on file  . Alcohol Use: No      Review of Systems  Constitutional: Negative for  fever, chills, diaphoresis, fatigue and unexpected weight change.  Eyes: Negative for visual disturbance.  Respiratory: Negative for shortness of breath.   Cardiovascular: Negative for chest pain and leg swelling.  Gastrointestinal: Positive for abdominal pain. Negative for heartburn, nausea, vomiting, diarrhea, constipation, blood in stool, hematochezia, anorexia and hematemesis.  Genitourinary: Negative for dysuria, urgency, frequency and hematuria.  Musculoskeletal: Negative for back pain.  Skin: Negative for color change and wound.  Neurological: Negative for dizziness, seizures, syncope, light-headedness and headaches.    Allergies  Review of patient's allergies indicates no known allergies.  Home Medications   No current outpatient prescriptions on file.  BP 118/64  Pulse 89  Temp(Src) 97.7 F (36.5 C) (Oral)  Resp 20  SpO2 99%  Physical Exam  Constitutional: He is oriented to person, place, and time. He appears well-developed and well-nourished. No distress.  HENT:  Head: Normocephalic and atraumatic.  Eyes: EOM are normal.  Neck: Normal range of motion. Neck supple.  Cardiovascular: Normal rate, regular rhythm and normal heart sounds.   Pulmonary/Chest: Effort normal and breath sounds normal. No respiratory distress.  Abdominal: Soft. Bowel sounds are normal. He exhibits no distension and no mass. There is no tenderness. There is no rebound and no guarding. Hernia confirmed negative in the right inguinal area and confirmed negative in the left inguinal area.  Genitourinary: Penis normal. Cremasteric reflex is present. Right testis shows tenderness. Right testis shows no mass and no swelling. Left testis shows no  mass, no swelling and no tenderness. Circumcised.  Musculoskeletal: Normal range of motion.  Lymphadenopathy:       Right: No inguinal adenopathy present.       Left: No inguinal adenopathy present.  Neurological: He is alert and oriented to person, place, and  time.  Skin: Skin is warm and dry. No lesion noted. He is not diaphoretic.  Psychiatric: He has a normal mood and affect.    ED Course  Procedures (including critical care time)  Labs Reviewed  URINALYSIS, ROUTINE W REFLEX MICROSCOPIC - Abnormal; Notable for the following:    Specific Gravity, Urine 1.031 (*)    Glucose, UA >1000 (*)    All other components within normal limits  GLUCOSE, CAPILLARY - Abnormal; Notable for the following:    Glucose-Capillary >600 (*)    All other components within normal limits  BASIC METABOLIC PANEL - Abnormal; Notable for the following:    Sodium 128 (*)    Chloride 91 (*)    Glucose, Bld 665 (*)    GFR calc non Af Amer 79 (*)    All other components within normal limits  POCT I-STAT 3, BLOOD GAS (G3P V) - Abnormal; Notable for the following:    pH, Ven 7.426 (*)    pCO2, Ven 42.1 (*)    pO2, Ven 27.0 (*)    Bicarbonate 27.8 (*)    Acid-Base Excess 3.0 (*)    All other components within normal limits  CBC - Abnormal; Notable for the following:    Hemoglobin 11.7 (*)    HCT 37.0 (*)    MCV 71.6 (*)    MCH 22.6 (*)    All other components within normal limits  GLUCOSE, CAPILLARY - Abnormal; Notable for the following:    Glucose-Capillary 483 (*)    All other components within normal limits  GLUCOSE, CAPILLARY - Abnormal; Notable for the following:    Glucose-Capillary 334 (*)    All other components within normal limits  GLUCOSE, CAPILLARY - Abnormal; Notable for the following:    Glucose-Capillary 67 (*)    All other components within normal limits  MAGNESIUM  URINE MICROSCOPIC-ADD ON  DIFFERENTIAL  POCT CBG MONITORING  POCT CBG MONITORING  POCT CBG MONITORING  POCT CBG MONITORING  CBC  CREATININE, SERUM  HEMOGLOBIN A1C  TSH   Dg Chest 2 View  05/06/2011  *RADIOLOGY REPORT*  Clinical Data: Right side abdominal pain, diabetes  CHEST - 2 VIEW  Comparison: None  Findings: Normal heart size, mediastinal contours, and pulmonary  vascularity. Lungs clear. No pleural effusion or pneumothorax. Question left nipple shadow versus nodule. No acute osseous findings.  IMPRESSION: Question left nipple shadow; recommend repeat PA chest radiograph with nipple markers to exclude pulmonary nodule. Otherwise negative exam.  Original Report Authenticated By: Burnetta Sabin, M.D.   Dg Abd 1 View  05/06/2011  *RADIOLOGY REPORT*  Clinical Data: Right lower abdomen pain  ABDOMEN - 1 VIEW  Comparison: None  Findings: Mildly prominent stool throughout colon. No bowel dilatation bowel wall thickening or evidence of obstruction. Bones unremarkable. Small right pelvic phleboliths. No definite urinary tract calcification.  IMPRESSION: Mildly prominent stool throughout colon.  Original Report Authenticated By: Burnetta Sabin, M.D.     No diagnosis found. CBG>600.  Patient given IVF NS and given 10 units regular insulin, which brought the blood sugar down to 483.   VBG revealed alkalosis and no ketones in the urine.  Therefore, do not feel that patient is  in DKA. Discussed patient with [facility] who will come admit patient.  Hospitalist requested that an abdominal xray and CXR be ordered.    MDM  Feel that bilateral foot pain and abdominal pain most likely related to hyperglycemia.        Sherlyn Lees [facility] 05/06/11 1644

## 2011-05-06 NOTE — ED Notes (Signed)
Patients cbg is 483 on glucometer.

## 2011-05-07 LAB — BASIC METABOLIC PANEL
BUN: 12 mg/dL (ref 6–23)
CO2: 26 mEq/L (ref 19–32)
Calcium: 9.3 mg/dL (ref 8.4–10.5)
Chloride: 99 mEq/L (ref 96–112)
Creatinine, Ser: 1.02 mg/dL (ref 0.50–1.35)
GFR calc Af Amer: >90 mL/min (ref >90)
GFR calc non Af Amer: 87 mL/min — ABNORMAL LOW (ref >90)
Glucose, Bld: 345 mg/dL — ABNORMAL HIGH (ref 70–99)
Potassium: 4.3 mEq/L (ref 3.5–5.1)
Sodium: 134 mEq/L — ABNORMAL LOW (ref 135–145)

## 2011-05-07 LAB — GLUCOSE, CAPILLARY
Glucose-Capillary: 258 mg/dL — ABNORMAL HIGH (ref 70–99)
Glucose-Capillary: 328 mg/dL — ABNORMAL HIGH (ref 70–99)
Glucose-Capillary: 333 mg/dL — ABNORMAL HIGH (ref 70–99)
Glucose-Capillary: 352 mg/dL — ABNORMAL HIGH (ref 70–99)
Glucose-Capillary: 359 mg/dL — ABNORMAL HIGH (ref 70–99)
Glucose-Capillary: 390 mg/dL — ABNORMAL HIGH (ref 70–99)

## 2011-05-07 LAB — CBC
HCT: 36.8 % — ABNORMAL LOW (ref 39.0–52.0)
Hemoglobin: 12.1 g/dL — ABNORMAL LOW (ref 13.0–17.0)
MCH: 23.7 pg — ABNORMAL LOW (ref 26.0–34.0)
MCHC: 32.9 g/dL (ref 30.0–36.0)
MCV: 72.2 fL — ABNORMAL LOW (ref 78.0–100.0)
Platelets: 240 10*3/uL (ref 150–400)
RBC: 5.1 MIL/uL (ref 4.22–5.81)
RDW: 13.4 % (ref 11.5–15.5)
WBC: 4.5 10*3/uL (ref 4.0–10.5)

## 2011-05-07 MED ORDER — INSULIN ASPART 100 UNIT/ML ~~LOC~~ SOLN
0.0000 [IU] | Freq: Three times a day (TID) | SUBCUTANEOUS | Status: DC
  Administered 2011-05-07: 6 [IU] via SUBCUTANEOUS
  Administered 2011-05-07 (×2): 10 [IU] via SUBCUTANEOUS
  Administered 2011-05-08: 6 [IU] via SUBCUTANEOUS
  Filled 2011-05-07: qty 3

## 2011-05-07 MED ORDER — INSULIN GLARGINE 100 UNIT/ML ~~LOC~~ SOLN
8.0000 [IU] | Freq: Every day | SUBCUTANEOUS | Status: DC
  Administered 2011-05-07: 8 [IU] via SUBCUTANEOUS
  Filled 2011-05-07: qty 3

## 2011-05-07 NOTE — Progress Notes (Signed)
Jeremiah [name] [id number] is a 45 y.o. male,  Outpatient Primary MD for the patient is No primary provider on file. Chief Complaint  Patient presents with  . Abdominal Pain   05/07/2011    Assessment & Plan   1. Hyperosmolar state due to Uncontrolled DM-2 in a patient who is out of Insulin for months - continue gentle IVF, low dose Lantus + gentle ISS,A1c>15, extremely labile sugars, monitor and titrate closely.  2. RLQ Abd Pain - with stable exam, no WBC, no fever, KUB only stool in colon, last BM 3 days ago, likely reason high sugars as above + constipation - stool softners, treat DM as above, pain now completely resolved.  3.Hyponatremia- due to #1, corrected Na stable.   DVT Prophylaxis Heparin   AM Labs Ordered, also please review Full Orders      Medications: Scheduled Meds:   . bisacodyl  10 mg Oral Once  . bisacodyl  10 mg Rectal Once  . dextrose  25 mL Intravenous Once  . heparin  5,000 Units Subcutaneous Q8H  . insulin aspart  0-9 Units Subcutaneous TID PC  . insulin aspart  10 Units Subcutaneous Once  . insulin glargine  8 Units Subcutaneous QPC breakfast  . DISCONTD: sodium chloride   Intravenous STAT  . DISCONTD: insulin aspart  0-9 Units Subcutaneous TID WC  . DISCONTD: insulin aspart  0-9 Units Subcutaneous TID WC  . DISCONTD: insulin aspart  0-9 Units Subcutaneous TID PC  . DISCONTD: insulin glargine  12 Units Subcutaneous QAM  . DISCONTD: insulin regular  10 Units Subcutaneous Once   Continuous Infusions:   . sodium chloride 100 mL/hr at 05/06/11 2100   PRN Meds:.albuterol, bisacodyl, HYDROcodone-acetaminophen, senna-docusate   Subjective:   Jeremiah [name] today has, No headache, No chest pain, No abdominal pain - No Nausea, No new weakness tingling or numbness, No Cough - SOB.    Objective:   Vital signs in last 24 hours:  Filed Vitals:   05/06/11 1128 05/06/11 1500 05/06/11 2131 05/07/11 0521  BP: 118/64 109/74 139/67  129/69  Pulse: 89 92 88 77  Temp:  97.9 F (36.6 C) 97.9 F (36.6 C) 98.2 F (36.8 C)  TempSrc:   Oral   Resp: 20 16 18 18   Height:  5\' 11"  (1.803 m)    Weight:  77.111 kg (170 lb)    SpO2: 99% 98% 100% 100%    Intake/Output from previous day:   Intake/Output Summary (Last 24 hours) at 05/07/11 1027 Last data filed at 05/07/11 0700  Gross per 24 hour  Intake   1200 ml  Output      2 ml  Net   1198 ml      Exam Awake Alert, Oriented *3, No new F.N deficits, Normal affect [place].AT,PERRAL Supple Neck,No JVD, No cervical lymphadenopathy appriciated.  Symmetrical Chest wall movement, Good air movement bilaterally, CTAB RRR,No Gallops,Rubs or new Murmurs, No Parasternal Heave +ve B.Sounds, Abd Soft, Non tender, No organomegaly appriciated, No rebound -guarding or rigidity. No Cyanosis, Clubbing or edema, No new Rash or bruise   Data Review   Radiology Reports  Dg Chest 2 View  05/06/2011  *RADIOLOGY REPORT*  Clinical Data: Right side abdominal pain, diabetes  CHEST - 2 VIEW  Comparison: None  Findings: Normal heart size, mediastinal contours, and pulmonary vascularity. Lungs clear. No pleural effusion or pneumothorax. Question left nipple shadow versus nodule. No acute osseous findings.  IMPRESSION: Question left nipple shadow; recommend repeat PA chest radiograph  with nipple markers to exclude pulmonary nodule. Otherwise negative exam.  Original Report Authenticated By: Burnetta Sabin, M.D.   Dg Abd 1 View  05/06/2011  *RADIOLOGY REPORT*  Clinical Data: Right lower abdomen pain  ABDOMEN - 1 VIEW  Comparison: None  Findings: Mildly prominent stool throughout colon. No bowel dilatation bowel wall thickening or evidence of obstruction. Bones unremarkable. Small right pelvic phleboliths. No definite urinary tract calcification.  IMPRESSION: Mildly prominent stool throughout colon.  Original Report Authenticated By: Burnetta Sabin, M.D.     Lab Results:  Micro Results: No results  found for this or any previous visit (from the past 240 hour(s)).   Basename 05/07/11 0600 05/06/11 1643 05/06/11 1005  NA 134* -- 128*  K 4.3 -- 5.0  CL 99 -- 91*  CO2 26 -- 25  GLUCOSE 345* -- 665*  BUN 12 -- 20  CREATININE 1.02 1.01 --  CALCIUM 9.3 -- 9.9  MG -- -- 2.2  PHOS -- -- --   No results found for this basename: AST:2,ALT:2,ALKPHOS:2,BILITOT:2,PROT:2,ALBUMIN:2 in the last 72 hours No results found for this basename: LIPASE:2,AMYLASE:2 in the last 72 hours  Basename 05/07/11 0600 05/06/11 1643 05/06/11 1144  WBC 4.5 4.2 --  NEUTROABS -- -- 2.7  HGB 12.1* 12.7* --  HCT 36.8* 38.4* --  MCV 72.2* 71.8* --  PLT 240 242 --   No results found for this basename: CKTOTAL:3,CKMB:3,CKMBINDEX:3,TROPONINI:3 in the last 72 hours No results found for this basename: POCBNP:3 in the last 72 hours No results found for this basename: DDIMER:2 in the last 72 hours  Basename 05/06/11 1643  HGBA1C 15.8*   No results found for this basename: CHOL:2,HDL:2,LDLCALC:2,TRIG:2,CHOLHDL:2,LDLDIRECT:2 in the last 72 hours  Basename 05/06/11 1643  TSH 2.112  T4TOTAL --  T3FREE --  THYROIDAB --   No results found for this basename: VITAMINB12:2,FOLATE:2,FERRITIN:2,TIBC:2,IRON:2,RETICCTPCT:2 in the last 72 hours

## 2011-05-08 LAB — GLUCOSE, CAPILLARY
Glucose-Capillary: 269 mg/dL — ABNORMAL HIGH (ref 70–99)
Glucose-Capillary: 289 mg/dL — ABNORMAL HIGH (ref 70–99)
Glucose-Capillary: 345 mg/dL — ABNORMAL HIGH (ref 70–99)
Glucose-Capillary: 216 mg/dL — ABNORMAL HIGH (ref 70–99)
Glucose-Capillary: 356 mg/dL — ABNORMAL HIGH (ref 70–99)

## 2011-05-08 MED ORDER — INSULIN ASPART 100 UNIT/ML ~~LOC~~ SOLN
0.0000 [IU] | Freq: Three times a day (TID) | SUBCUTANEOUS | Status: DC
  Administered 2011-05-08: 9 [IU] via SUBCUTANEOUS
  Administered 2011-05-08: 6 [IU] via SUBCUTANEOUS
  Administered 2011-05-09: 5 [IU] via SUBCUTANEOUS
  Filled 2011-05-08: qty 3

## 2011-05-08 MED ORDER — INSULIN GLARGINE 100 UNIT/ML ~~LOC~~ SOLN
15.0000 [IU] | Freq: Every day | SUBCUTANEOUS | Status: DC
  Administered 2011-05-08 – 2011-05-09 (×2): 15 [IU] via SUBCUTANEOUS
  Filled 2011-05-08: qty 3

## 2011-05-08 MED ORDER — INSULIN ASPART 100 UNIT/ML ~~LOC~~ SOLN
4.0000 [IU] | Freq: Once | SUBCUTANEOUS | Status: AC
  Administered 2011-05-08: 4 [IU] via SUBCUTANEOUS

## 2011-05-08 NOTE — Progress Notes (Signed)
Jeremiah [name] [id number] is a 45 y.o. male,  Outpatient Primary MD for the patient is No primary provider on file. Chief Complaint  Patient presents with  . Abdominal Pain   05/08/2011    Assessment & Plan   1. Hyperosmolar state due to Uncontrolled DM-2 in a patient who is out of Insulin for months -stop  IVF, low dose Lantus increased today to 15 + gentle ISS, A1c>15, extremely labile sugars, monitor and titrate closely. Begin insulin education and case management input.  2. RLQ Abd Pain - with stable exam, no WBC, no fever, KUB only stool in colon, last BM 3 days ago, likely reason high sugars as above + constipation - stool softners, treat DM as above, pain now completely resolved.  3.Hyponatremia- due to #1, corrected Na stable. BMP in am.   DVT Prophylaxis Heparin   AM Labs Ordered, also please review Full Orders      Medications: Scheduled Meds:    . dextrose  25 mL Intravenous Once  . heparin  5,000 Units Subcutaneous Q8H  . insulin aspart  0-9 Units Subcutaneous TID PC  . insulin glargine  15 Units Subcutaneous QPC breakfast  . DISCONTD: insulin aspart  0-9 Units Subcutaneous TID PC  . DISCONTD: insulin glargine  8 Units Subcutaneous QPC breakfast   Continuous Infusions:    . sodium chloride 100 mL/hr at 05/06/11 2100   PRN Meds:.albuterol, bisacodyl, HYDROcodone-acetaminophen, senna-docusate   Subjective:   Jeremiah [name] today has, No headache, No chest pain, No abdominal pain - No Nausea, No new weakness tingling or numbness, No Cough - SOB.    Objective:   Vital signs in last 24 hours:  Filed Vitals:   05/07/11 0521 05/07/11 1400 05/07/11 2245 05/08/11 0615  BP: 129/69 131/75 139/77 154/81  Pulse: 77 90 75 75  Temp: 98.2 F (36.8 C) 98.5 F (36.9 C) 98.1 F (36.7 C) 98.7 F (37.1 C)  TempSrc:  Oral  Oral  Resp: 18 20 16 19   Height:      Weight:      SpO2: 100% 100% 100% 100%    Intake/Output from previous  day:   Intake/Output Summary (Last 24 hours) at 05/08/11 1000 Last data filed at 05/07/11 2200  Gross per 24 hour  Intake   1980 ml  Output      1 ml  Net   1979 ml      Exam Awake Alert, Oriented *3, No new F.N deficits, Normal affect [place].AT,PERRAL Supple Neck,No JVD, No cervical lymphadenopathy appriciated.  Symmetrical Chest wall movement, Good air movement bilaterally, CTAB RRR,No Gallops,Rubs or new Murmurs, No Parasternal Heave +ve B.Sounds, Abd Soft, Non tender, No organomegaly appriciated, No rebound -guarding or rigidity. No Cyanosis, Clubbing or edema, No new Rash or bruise   Data Review   Radiology Reports  Dg Chest 2 View  05/06/2011  *RADIOLOGY REPORT*  Clinical Data: Right side abdominal pain, diabetes  CHEST - 2 VIEW  Comparison: None  Findings: Normal heart size, mediastinal contours, and pulmonary vascularity. Lungs clear. No pleural effusion or pneumothorax. Question left nipple shadow versus nodule. No acute osseous findings.  IMPRESSION: Question left nipple shadow; recommend repeat PA chest radiograph with nipple markers to exclude pulmonary nodule. Otherwise negative exam.  Original Report Authenticated By: Burnetta Sabin, M.D.   Dg Abd 1 View  05/06/2011  *RADIOLOGY REPORT*  Clinical Data: Right lower abdomen pain  ABDOMEN - 1 VIEW  Comparison: None  Findings: Mildly prominent stool throughout colon.  No bowel dilatation bowel wall thickening or evidence of obstruction. Bones unremarkable. Small right pelvic phleboliths. No definite urinary tract calcification.  IMPRESSION: Mildly prominent stool throughout colon.  Original Report Authenticated By: Burnetta Sabin, M.D.     Lab Results:  Micro Results: No results found for this or any previous visit (from the past 240 hour(s)).   Basename 05/07/11 0600 05/06/11 1643 05/06/11 1005  NA 134* -- 128*  K 4.3 -- 5.0  CL 99 -- 91*  CO2 26 -- 25  GLUCOSE 345* -- 665*  BUN 12 -- 20  CREATININE 1.02 1.01 --   CALCIUM 9.3 -- 9.9  MG -- -- 2.2  PHOS -- -- --   No results found for this basename: AST:2,ALT:2,ALKPHOS:2,BILITOT:2,PROT:2,ALBUMIN:2 in the last 72 hours No results found for this basename: LIPASE:2,AMYLASE:2 in the last 72 hours  Basename 05/07/11 0600 05/06/11 1643 05/06/11 1144  WBC 4.5 4.2 --  NEUTROABS -- -- 2.7  HGB 12.1* 12.7* --  HCT 36.8* 38.4* --  MCV 72.2* 71.8* --  PLT 240 242 --   No results found for this basename: CKTOTAL:3,CKMB:3,CKMBINDEX:3,TROPONINI:3 in the last 72 hours No results found for this basename: POCBNP:3 in the last 72 hours No results found for this basename: DDIMER:2 in the last 72 hours  Basename 05/06/11 1643  HGBA1C 15.8*   No results found for this basename: CHOL:2,HDL:2,LDLCALC:2,TRIG:2,CHOLHDL:2,LDLDIRECT:2 in the last 72 hours  Basename 05/06/11 1643  TSH 2.112  T4TOTAL --  T3FREE --  THYROIDAB --   No results found for this basename: VITAMINB12:2,FOLATE:2,FERRITIN:2,TIBC:2,IRON:2,RETICCTPCT:2 in the last 72 hours

## 2011-05-09 LAB — BASIC METABOLIC PANEL
BUN: 11 mg/dL (ref 6–23)
CO2: 29 mEq/L (ref 19–32)
Calcium: 9.6 mg/dL (ref 8.4–10.5)
Chloride: 102 mEq/L (ref 96–112)
Creatinine, Ser: 0.95 mg/dL (ref 0.50–1.35)
GFR calc Af Amer: >90 mL/min (ref >90)
GFR calc non Af Amer: >90 mL/min (ref >90)
Glucose, Bld: 217 mg/dL — ABNORMAL HIGH (ref 70–99)
Potassium: 3.9 mEq/L (ref 3.5–5.1)
Sodium: 138 mEq/L (ref 135–145)

## 2011-05-09 LAB — GLUCOSE, CAPILLARY
Glucose-Capillary: 201 mg/dL — ABNORMAL HIGH (ref 70–99)
Glucose-Capillary: 321 mg/dL — ABNORMAL HIGH (ref 70–99)

## 2011-05-09 MED ORDER — INSULIN ASPART 100 UNIT/ML ~~LOC~~ SOLN: SUBCUTANEOUS | Status: DC

## 2011-05-09 MED ORDER — INSULIN ASPART 100 UNIT/ML ~~LOC~~ SOLN: 0.0000 [IU] | Freq: Three times a day (TID) | SUBCUTANEOUS | Status: DC

## 2011-05-09 MED ORDER — INSULIN ASPART 100 UNIT/ML ~~LOC~~ SOLN
0.0000 [IU] | Freq: Three times a day (TID) | SUBCUTANEOUS | Status: DC
  Administered 2011-05-09: 10 [IU] via SUBCUTANEOUS

## 2011-05-09 MED ORDER — SENNOSIDES-DOCUSATE SODIUM 8.6-50 MG PO TABS: 1.0000 | ORAL_TABLET | Freq: Every day | ORAL | Status: DC | PRN

## 2011-05-09 MED ORDER — INSULIN GLARGINE 100 UNIT/ML ~~LOC~~ SOLN: 20.0000 [IU] | Freq: Every day | SUBCUTANEOUS | Status: DC

## 2011-05-09 NOTE — Progress Notes (Signed)
Utilization Review Completed.  Jeremiah [name]  05/09/2011

## 2011-05-09 NOTE — Discharge Summary (Addendum)
Jeremiah [name], 45 y.o., DOB September 30, 1965, MRN [id number]. Admission date: 05/06/2011 Discharge Date 05/09/2011 Primary MD No primary provider on file. Admitting Physician Thurnell Lose, MD  Admission Diagnosis  lower rt side abd pain, bilateral ft pain  Discharge Diagnosis   Active Problems:  Hyperosmolarity due to secondary diabetes  Type II or unspecified type diabetes mellitus without mention of complication, uncontrolled  Abdominal  pain, other specified site    Past Medical History  Diagnosis Date  . Diabetes mellitus     Past Surgical History  Procedure Date  . Mandible fracture surgery     Consults S.Work, Diabetes education  Significant Tests:  See full reports for all details     Dg Chest 2 View  05/06/2011  *RADIOLOGY REPORT*  Clinical Data: Right side abdominal pain, diabetes  CHEST - 2 VIEW  Comparison: None  Findings: Normal heart size, mediastinal contours, and pulmonary vascularity. Lungs clear. No pleural effusion or pneumothorax. Question left nipple shadow versus nodule. No acute osseous findings.  IMPRESSION: Question left nipple shadow; recommend repeat PA chest radiograph with nipple markers to exclude pulmonary nodule. Otherwise negative exam.  Original Report Authenticated By: Burnetta Sabin, M.D.   Dg Abd 1 View  05/06/2011  *RADIOLOGY REPORT*  Clinical Data: Right lower abdomen pain  ABDOMEN - 1 VIEW  Comparison: None  Findings: Mildly prominent stool throughout colon. No bowel dilatation bowel wall thickening or evidence of obstruction. Bones unremarkable. Small right pelvic phleboliths. No definite urinary tract calcification.  IMPRESSION: Mildly prominent stool throughout colon.  Original Report Authenticated By: Burnetta Sabin, M.D.    Hospital Course - See H&P, Labs, Consult and Test reports for all details in brief, patient was admitted for  Uncontrolled DM-2 , pt was out of his Insulin for > 6 months and presented in Hyperosmolar state due to  Uncontrolled DM-2 with sugars > 650 , he was rehydrated with 5 lits of IVF along with gentle titration of his Lantus and ISS, now sugars in low 200s, have adjusted insulin again today, patient will get Insulin education again today, will be seen by S work, outpt Meds to be arranged along with follow up with PCP/Free clinic, pt had some RLQ pain due to dehydration and Uncontrolled sugars which had completely resolved next day after IVF and stool softners. Patient now eager to go home and says he will himself arrange for Lantus if help cannot be provided, is completely symptom free.  Outpt follow up for DM-2, Anemia as appropriate.   Today   Subjective:   Jeremiah [name] today has no headache,no chest abdominal pain,no new weakness tingling or numbness, feels much better wants to go home today.    Objective:   Blood pressure 148/89, pulse 82, temperature 98.3 F (36.8 C), temperature source Oral, resp. rate 18, height 5\' 11"  (1.803 m), weight 77.111 kg (170 lb), SpO2 100.00%.  Intake/Output Summary (Last 24 hours) at 05/09/11 1225 Last data filed at 05/09/11 0700  Gross per 24 hour  Intake    780 ml  Output      0 ml  Net    780 ml    Exam Awake Alert, Oriented *3, No new F.N deficits, Normal affect Barren.AT,PERRAL Supple Neck,No JVD, No cervical lymphadenopathy appriciated.  Symmetrical Chest wall movement, Good air movement bilaterally, CTAB RRR,No Gallops,Rubs or new Murmurs, No Parasternal Heave +ve B.Sounds, Abd Soft, Non tender, No organomegaly appriciated, No rebound -guarding or rigidity. No Cyanosis, Clubbing or edema, No new  Rash or bruise  Data Review   CBC w Diff: Lab Results  Component Value Date   WBC 4.5 05/07/2011   HGB 12.1* 05/07/2011   HCT 36.8* 05/07/2011   PLT 240 05/07/2011   LYMPHOPCT 25 05/06/2011   MONOPCT 9 05/06/2011   EOSPCT 0 05/06/2011   BASOPCT 1 05/06/2011   CMP: Lab Results  Component Value Date   NA 138 05/09/2011   K 3.9 05/09/2011   CL 102  05/09/2011   CO2 29 05/09/2011   BUN 11 05/09/2011   CREATININE 0.95 05/09/2011   PROT 7.4 03/06/2008   ALBUMIN 4.4 03/06/2008   BILITOT 0.3 03/06/2008   ALKPHOS 65 03/06/2008   AST 15 03/06/2008   ALT 14 03/06/2008  . Discharge Instructions     Follow with Primary MD / free Clinic in 7 days   Get CBC, CMP, checked 7 days by Primary MD and again as instructed by your Primary MD. Get a 2 view Chest X ray done next visit if you had Pneumonia of Lung problems at the [place] reviewed and adjusted.  Accuchecks 4 times/day, Once in AM empty stomach and then before each meal. Log in all results and show them to your Prim.MD in 3 days. If any glucose reading is under 80 or above 300 call your Prim MD immidiately. Follow Low glucose instructions for glucose under 80 as instructed.  Your Pre Meal Novolog Scale is as follows -   CBG < 80: Implement Hypoglycemia Protocol. CBG 70 - 120 (dose in units): 0 , CBG 121 - 150 (dose in units): 2, CBG 151 - 200 (dose in units): 4, CBG 201 - 250 (dose in units): 6, CBG 251 - 300 (dose in units): 8, CBG 301 - 350 (dose in units) 10, CBG 351 - 400 (dose in units): 12, CBG > 400 Call MD.  Please request your Prim.MD to go over all Hospital Tests and Procedure/Radiological results at the follow up, please get all Hospital records sent to your Prim MD by signing hospital release before you go home.  Activity: Fall precautions use walker/cane & assistance as needed  Do not drive if your were admitted for syncope or siezures until you have seen by Primary MD or a Neurologist and advised to drive.  Do not drive when taking Pain medications.   Wear Seat belts while driving.  Diet: Daibetic  Disposition Home  If you experience worsening of your admission symptoms, develop shortness of breath, life threatening emergency, suicidal or homicidal thoughts you must seek medical attention immediately by calling 911 or calling your MD immediately  if  symptoms less severe.  Do not take more than prescribed Pain, Sleep and Anxiety Medications  Special Instructions: If you have smoked or chewed Tobacco  in the last 2 yrs please stop smoking, stop any regular Alcohol  and or any Recreational drug use.  You Must read complete instructions/literature along with all the possible adverse reactions/side effects for all the Medicines you take and that have been prescribed to you. Take any new Medicines after you have completely understood and accpet all the possible adverse reactions/side effects.      Follow-up Information    Follow up with [facility]. ([facility] 07/05/2011 at [place] , [phone])       Follow up with Dorna Mai, MD. ([facility] 07/08/2011 at 2:15pm, [id number])    Contact information:   [address] [place] [phone]  [place], [place]  Home Medication Instructions [id number]   Printed on:05/09/11 1236  Medication Information                    insulin glargine (LANTUS) 100 UNIT/ML injection Inject 20 Units into the skin daily after breakfast.           senna-docusate (SENOKOT-S) 8.6-50 MG per tablet Take 1 tablet by mouth daily as needed for constipation.           insulin aspart (NOVOLOG) 100 UNIT/ML injection CBG < 80: Implement Hypoglycemia Protocol. CBG 70 - 120 (dose in units): 0 , CBG 121 - 150 (dose in units): 2, CBG 151 - 200 (dose in units): 4, CBG 201 - 250 (dose in units): 6, CBG 251 - 300 (dose in units): 8, CBG 301 - 350 (dose in units) 10, CBG 351 - 400 (dose in units): 12, CBG > 400 Call MD.             Discharge Medications    Total Time in preparing paper work, data evaluation and todays exam - 35 minutes  Signed: Thurnell Lose 05/09/2011 12:25 PM  The patient was admitted to the Hospitalist Service.

## 2011-05-09 NOTE — Progress Notes (Addendum)
Pt given inform on [facility] appt for [place] on 1/8 at 9am, appt w dr Redmond Pulling at [facility] on 1/11 at 2:15pm. Pt has printed inform on [facility] and also on [facility] also. No ins. Will also give pt pt assist form for insulin from drug company. Will some of meds for pt also.

## 2011-05-09 NOTE — Plan of Care (Signed)
Problem: Discharge Progression Outcomes Goal: CBGs controlled on DM discharge meds Outcome: Adequate for Discharge MD is discharging pt with strict instructions to monitor CBGs closely & administer insulin

## 2011-05-09 NOTE — ED Provider Notes (Signed)
Medical screening examination/treatment/procedure(s) were conducted as a shared visit with non-physician practitioner(s) and myself.  I personally evaluated the patient during the encounter.  44yM with abdominal pain. RLQ. Benign abdominal exam. Possible. Sugar also markedly elevated. Pt hx of diabetes but unable to obtain appropriate meds including insulin because of financial/social problems. No lab evidence of DKA. May be contributing to pain. Improved with IVF and insulin. Will admit for further tx/eval.  Virgel Manifold, MD 05/09/11 [phone]

## 2011-06-15 ENCOUNTER — Encounter (HOSPITAL_COMMUNITY): Payer: Self-pay | Admitting: *Deleted

## 2011-06-15 ENCOUNTER — Emergency Department (HOSPITAL_COMMUNITY)
Admission: EM | Admit: 2011-06-15 | Discharge: 2011-06-16 | Disposition: A | Discharge status: Home / Self Care | Payer: Self-pay | Attending: Emergency Medicine | Admitting: Emergency Medicine

## 2011-06-15 DIAGNOSIS — Z794 Long term (current) use of insulin: Secondary | ICD-10-CM | POA: Insufficient documentation

## 2011-06-15 DIAGNOSIS — M549 Dorsalgia, unspecified: Secondary | ICD-10-CM | POA: Insufficient documentation

## 2011-06-15 DIAGNOSIS — R109 Unspecified abdominal pain: Secondary | ICD-10-CM | POA: Insufficient documentation

## 2011-06-15 DIAGNOSIS — E119 Type 2 diabetes mellitus without complications: Secondary | ICD-10-CM | POA: Insufficient documentation

## 2011-06-15 LAB — DIFFERENTIAL
Basophils Absolute: 0 10*3/uL (ref 0.0–0.1)
Basophils Relative: 0 % (ref 0–1)
Eosinophils Absolute: 0 10*3/uL (ref 0.0–0.7)
Eosinophils Relative: 1 % (ref 0–5)
Lymphocytes Relative: 31 % (ref 12–46)
Lymphs Abs: 1.5 10*3/uL (ref 0.7–4.0)
Monocytes Absolute: 0.6 10*3/uL (ref 0.1–1.0)
Monocytes Relative: 12 % (ref 3–12)
Neutro Abs: 2.7 10*3/uL (ref 1.7–7.7)
Neutrophils Relative %: 56 % (ref 43–77)

## 2011-06-15 LAB — URINALYSIS, ROUTINE W REFLEX MICROSCOPIC
Glucose, UA: NEGATIVE mg/dL
Hgb urine dipstick: NEGATIVE
Ketones, ur: 40 mg/dL — AB
Leukocytes, UA: NEGATIVE
Nitrite: NEGATIVE
Protein, ur: 100 mg/dL — AB
Specific Gravity, Urine: 1.035 — ABNORMAL HIGH (ref 1.005–1.030)
Urobilinogen, UA: 1 mg/dL (ref 0.0–1.0)
pH: 5 (ref 5.0–8.0)

## 2011-06-15 LAB — CBC
HCT: 37.3 % — ABNORMAL LOW (ref 39.0–52.0)
Hemoglobin: 12.1 g/dL — ABNORMAL LOW (ref 13.0–17.0)
MCH: 23.6 pg — ABNORMAL LOW (ref 26.0–34.0)
MCHC: 32.4 g/dL (ref 30.0–36.0)
MCV: 72.7 fL — ABNORMAL LOW (ref 78.0–100.0)
Platelets: 239 10*3/uL (ref 150–400)
RBC: 5.13 MIL/uL (ref 4.22–5.81)
RDW: 13.6 % (ref 11.5–15.5)
WBC: 4.9 10*3/uL (ref 4.0–10.5)

## 2011-06-15 LAB — URINE MICROSCOPIC-ADD ON

## 2011-06-15 LAB — BASIC METABOLIC PANEL
BUN: 15 mg/dL (ref 6–23)
CO2: 26 mEq/L (ref 19–32)
Calcium: 9.2 mg/dL (ref 8.4–10.5)
Chloride: 101 mEq/L (ref 96–112)
Creatinine, Ser: 0.95 mg/dL (ref 0.50–1.35)
GFR calc Af Amer: >90 mL/min (ref >90)
GFR calc non Af Amer: >90 mL/min (ref >90)
Glucose, Bld: 206 mg/dL — ABNORMAL HIGH (ref 70–99)
Potassium: 4.3 mEq/L (ref 3.5–5.1)
Sodium: 136 mEq/L (ref 135–145)

## 2011-06-15 LAB — GLUCOSE, CAPILLARY: Glucose-Capillary: 178 mg/dL — ABNORMAL HIGH (ref 70–99)

## 2011-06-15 MED ORDER — HYDROCODONE-ACETAMINOPHEN 7.5-325 MG/15ML PO SOLN: 15.0000 mL | ORAL | Status: AC | PRN

## 2011-06-15 MED ORDER — IBUPROFEN 800 MG PO TABS
800.0000 mg | ORAL_TABLET | Freq: Once | ORAL | Status: AC
  Administered 2011-06-15: 800 mg via ORAL
  Filled 2011-06-15: qty 1

## 2011-06-15 MED ORDER — HYDROCODONE-ACETAMINOPHEN 5-325 MG PO TABS
1.0000 | ORAL_TABLET | Freq: Once | ORAL | Status: AC
  Administered 2011-06-15: 1 via ORAL
  Filled 2011-06-15: qty 1

## 2011-06-15 NOTE — ED Notes (Signed)
C/o L low back pain & R thigh burning, (denies: nvd, fever, cough, congestion, cold sx, dizziness), new to DM dx, "trying to follow DM diet, has not had any DM counseling or classes", starting to check cbg at home. Alert, interactive, calm, skin W&D, resps e/u, speaking in clear complete sentences.

## 2011-06-15 NOTE — ED Notes (Signed)
Pt sts he has lower pain in his left back and side. Pt was seen in ED for the same and was admitted for diabetes management. Pt checked around 4pm and was around 200. Pt is A/Ox4. No distress. Denies any trouble with urination. Pt has lower abd pain.

## 2011-06-16 NOTE — ED Provider Notes (Signed)
History     CSN: [id number] Arrival date & time: 06/15/2011  8:13 PM   First MD Initiated Contact with Patient 06/15/11 2157      Chief Complaint  Patient presents with  . Back Pain     Patient is a 45 y.o. male presenting with flank pain. The history is provided by the patient.  Flank Pain This is a chronic problem. The current episode started more than 1 month ago. The problem occurs constantly. The problem has been gradually worsening. Pertinent negatives include no change in bowel habit, chest pain, chills, coughing, fever, nausea, sore throat, urinary symptoms or vomiting.  Reports onset of left flank area pain greater than one month ago. States the pain is a constant ache is worse with palpation and certain positions. Denies fever nausea vomiting diarrhea cough or any other associated symptoms.  Past Medical History  Diagnosis Date  . Diabetes mellitus     Past Surgical History  Procedure Date  . Mandible fracture surgery     Family History  Problem Relation Age of Onset  . Diabetes Mother     History  Substance Use Topics  . Smoking status: Never Smoker   . Smokeless tobacco: Not on file  . Alcohol Use: No      Review of Systems  Constitutional: Negative.  Negative for fever and chills.  HENT: Negative.  Negative for sore throat.   Eyes: Negative.   Respiratory: Negative.  Negative for cough.   Cardiovascular: Negative.  Negative for chest pain.  Gastrointestinal: Negative.  Negative for nausea, vomiting and change in bowel habit.  Genitourinary: Positive for flank pain.  Musculoskeletal: Negative.   Skin: Negative.   Neurological: Negative.   Hematological: Negative.   Psychiatric/Behavioral: Negative.     Allergies  Review of patient's allergies indicates no known allergies.  Home Medications   Current Outpatient Rx  Name Route Sig Dispense Refill  . INSULIN ASPART 100 UNIT/ML [place] SOLN Subcutaneous Inject 0-12 Units into the skin 3 (three)  times daily before meals. CBG < 80: Implement Hypoglycemia Protocol. CBG 70 - 120 (dose in units): 0 , CBG 121 - 150 (dose in units): 2, CBG 151 - 200 (dose in units): 4, CBG 201 - 250 (dose in units): 6, CBG 251 - 300 (dose in units): 8, CBG 301 - 350 (dose in units) 10, CBG 351 - 400 (dose in units): 12, CBG > 400 Call MD.     . INSULIN GLARGINE 100 UNIT/ML [place] SOLN Subcutaneous Inject 20 Units into the skin daily after breakfast.      . SENNOSIDES-DOCUSATE SODIUM 8.6-50 MG PO TABS Oral Take 1 tablet by mouth daily as needed. For constipation.     Marland Kitchen HYDROCODONE-ACETAMINOPHEN 7.5-325 MG/15ML PO SOLN Oral Take 15 mLs by mouth every 4 (four) hours as needed for pain. 75 mL 0    BP 127/82  Pulse 86  Temp(Src) 97.6 F (36.4 C) (Oral)  Resp 14  SpO2 100%  Physical Exam  Constitutional: He appears well-developed and well-nourished.  HENT:  Head: Normocephalic and atraumatic.  Eyes: Conjunctivae are normal.  Neck: Neck supple.  Cardiovascular: Normal rate and regular rhythm.   Pulmonary/Chest: Effort normal and breath sounds normal.  Abdominal: Soft. Bowel sounds are normal.    Musculoskeletal: Normal range of motion.       Arms: Neurological: He is alert.  Skin: Skin is warm and dry.  Psychiatric: He has a normal mood and affect.    ED Course  Procedures patient noted to be sleeping upon reevaluation. Findings and clinical impression discussed with patient. He admits pain much improved after medication. We'll plan for discharge home with short course of medication for pain and encourage patient to follow up with his primary care physician at [facility] as soon as can be arranged. Patient agreeable with plan.  Labs Reviewed  URINALYSIS, ROUTINE W REFLEX MICROSCOPIC - Abnormal; Notable for the following:    Specific Gravity, Urine 1.035 (*)    Bilirubin Urine SMALL (*)    Ketones, ur 40 (*)    Protein, ur 100 (*)    All other components within normal limits  GLUCOSE, CAPILLARY -  Abnormal; Notable for the following:    Glucose-Capillary 178 (*)    All other components within normal limits  CBC - Abnormal; Notable for the following:    Hemoglobin 12.1 (*)    HCT 37.3 (*)    MCV 72.7 (*)    MCH 23.6 (*)    All other components within normal limits  BASIC METABOLIC PANEL - Abnormal; Notable for the following:    Glucose, Bld 206 (*)    All other components within normal limits  URINE MICROSCOPIC-ADD ON  DIFFERENTIAL  POCT CBG MONITORING   No results found.   1. Flank pain       MDM  Left flank area pain x > 1 month, etiology unknown. Given symptoms present times greater than one month, unremarkable exam and clinical findings doubt acute process.   Jeryl Columbia, NP 06/16/11 [phone]

## 2011-06-16 NOTE — ED Provider Notes (Signed)
Medical screening examination/treatment/procedure(s) were performed by non-physician practitioner and as supervising physician I was immediately available for consultation/collaboration.    Kathalene Frames, MD 06/16/11 [phone]

## 2011-07-06 ENCOUNTER — Emergency Department (HOSPITAL_COMMUNITY): Payer: Self-pay

## 2011-07-06 ENCOUNTER — Encounter (HOSPITAL_COMMUNITY): Payer: Self-pay | Admitting: Emergency Medicine

## 2011-07-06 ENCOUNTER — Emergency Department (HOSPITAL_COMMUNITY)
Admission: EM | Admit: 2011-07-06 | Discharge: 2011-07-06 | Disposition: A | Discharge status: Home / Self Care | Payer: Self-pay | Attending: Emergency Medicine | Admitting: Emergency Medicine

## 2011-07-06 DIAGNOSIS — R1032 Left lower quadrant pain: Secondary | ICD-10-CM | POA: Insufficient documentation

## 2011-07-06 DIAGNOSIS — R739 Hyperglycemia, unspecified: Secondary | ICD-10-CM

## 2011-07-06 DIAGNOSIS — Z9114 Patient's other noncompliance with medication regimen: Secondary | ICD-10-CM

## 2011-07-06 DIAGNOSIS — Z91199 Patient's noncompliance with other medical treatment and regimen due to unspecified reason: Secondary | ICD-10-CM | POA: Insufficient documentation

## 2011-07-06 DIAGNOSIS — Z794 Long term (current) use of insulin: Secondary | ICD-10-CM | POA: Insufficient documentation

## 2011-07-06 DIAGNOSIS — R109 Unspecified abdominal pain: Secondary | ICD-10-CM | POA: Insufficient documentation

## 2011-07-06 DIAGNOSIS — E1169 Type 2 diabetes mellitus with other specified complication: Secondary | ICD-10-CM | POA: Insufficient documentation

## 2011-07-06 DIAGNOSIS — Z9119 Patient's noncompliance with other medical treatment and regimen: Secondary | ICD-10-CM | POA: Insufficient documentation

## 2011-07-06 LAB — URINALYSIS, ROUTINE W REFLEX MICROSCOPIC
Bilirubin Urine: NEGATIVE
Glucose, UA: >1000 mg/dL — AB
Hgb urine dipstick: NEGATIVE
Ketones, ur: 15 mg/dL — AB
Leukocytes, UA: NEGATIVE
Nitrite: NEGATIVE
Protein, ur: NEGATIVE mg/dL
Specific Gravity, Urine: 1.043 — ABNORMAL HIGH (ref 1.005–1.030)
Urobilinogen, UA: 1 mg/dL (ref 0.0–1.0)
pH: 6 (ref 5.0–8.0)

## 2011-07-06 LAB — URINE MICROSCOPIC-ADD ON

## 2011-07-06 LAB — BASIC METABOLIC PANEL
BUN: 15 mg/dL (ref 6–23)
CO2: 27 mEq/L (ref 19–32)
Calcium: 9.2 mg/dL (ref 8.4–10.5)
Chloride: 94 mEq/L — ABNORMAL LOW (ref 96–112)
Creatinine, Ser: 1.04 mg/dL (ref 0.50–1.35)
GFR calc Af Amer: >90 mL/min (ref >90)
GFR calc non Af Amer: 84 mL/min — ABNORMAL LOW (ref >90)
Glucose, Bld: 570 mg/dL (ref 70–99)
Potassium: 4.7 mEq/L (ref 3.5–5.1)
Sodium: 130 mEq/L — ABNORMAL LOW (ref 135–145)

## 2011-07-06 LAB — GLUCOSE, CAPILLARY
Glucose-Capillary: 510 mg/dL — ABNORMAL HIGH (ref 70–99)
Glucose-Capillary: 254 mg/dL — ABNORMAL HIGH (ref 70–99)
Glucose-Capillary: 157 mg/dL — ABNORMAL HIGH (ref 70–99)

## 2011-07-06 LAB — CBC
HCT: 35.6 % — ABNORMAL LOW (ref 39.0–52.0)
Hemoglobin: 11.9 g/dL — ABNORMAL LOW (ref 13.0–17.0)
MCH: 24 pg — ABNORMAL LOW (ref 26.0–34.0)
MCHC: 33.4 g/dL (ref 30.0–36.0)
MCV: 71.8 fL — ABNORMAL LOW (ref 78.0–100.0)
Platelets: 217 10*3/uL (ref 150–400)
RBC: 4.96 MIL/uL (ref 4.22–5.81)
RDW: 13.3 % (ref 11.5–15.5)
WBC: 4 10*3/uL (ref 4.0–10.5)

## 2011-07-06 MED ORDER — SODIUM CHLORIDE 0.9 % IV BOLUS (SEPSIS)
1000.0000 mL | Freq: Once | INTRAVENOUS | Status: AC
  Administered 2011-07-06: 1000 mL via INTRAVENOUS

## 2011-07-06 MED ORDER — INSULIN GLARGINE 100 UNIT/ML ~~LOC~~ SOLN: 20.0000 [IU] | Freq: Every day | SUBCUTANEOUS | Status: DC

## 2011-07-06 MED ORDER — INSULIN REGULAR HUMAN 100 UNIT/ML IJ SOLN
10.0000 [IU] | Freq: Once | INTRAMUSCULAR | Status: DC
Start: 2011-07-06 — End: 2011-07-06
  Filled 2011-07-06: qty 0.1

## 2011-07-06 MED ORDER — INSULIN ASPART 100 UNIT/ML ~~LOC~~ SOLN: 0.0000 [IU] | Freq: Three times a day (TID) | SUBCUTANEOUS | Status: DC

## 2011-07-06 MED ORDER — OXYCODONE-ACETAMINOPHEN 5-325 MG PO TABS
2.0000 | ORAL_TABLET | Freq: Once | ORAL | Status: AC
  Administered 2011-07-06: 2 via ORAL
  Filled 2011-07-06: qty 2

## 2011-07-06 MED ORDER — INSULIN ASPART 100 UNIT/ML ~~LOC~~ SOLN
10.0000 [IU] | Freq: Once | SUBCUTANEOUS | Status: AC
  Administered 2011-07-06: 10 [IU] via INTRAVENOUS

## 2011-07-06 MED ORDER — INSULIN ASPART 100 UNIT/ML ~~LOC~~ SOLN
SUBCUTANEOUS | Status: AC
  Filled 2011-07-06: qty 1

## 2011-07-06 NOTE — ED Notes (Signed)
CBG 254. Dr.Campos aware

## 2011-07-06 NOTE — ED Notes (Signed)
2nd bag of saline hung

## 2011-07-06 NOTE — ED Notes (Signed)
Patient transported to CT 

## 2011-07-06 NOTE — ED Notes (Signed)
CBG 157 

## 2011-07-06 NOTE — ED Notes (Signed)
Critical glucose from lab of 570.

## 2011-07-06 NOTE — ED Notes (Signed)
PT. REPORTS LLQ AND LEFT BACK PAIN FOR SEVERAL WEEKS , DENIES NAUSEA, VOMITTING OR DIARRHEA. NO FEVER OR CHILLS.

## 2011-07-06 NOTE — ED Provider Notes (Signed)
History     CSN: [id number]  Arrival date & time 07/06/11  0453   First MD Initiated Contact with Patient 07/06/11 [phone]      Chief Complaint  Patient presents with  . Abdominal Pain     Patient is a 46 y.o. male presenting with abdominal pain. The history is provided by the patient.  Abdominal Pain The primary symptoms of the illness include abdominal pain.   patient reports approximately 2 weeks of intermittent left lower quadrant pain radiating to the left flank.  He denies nausea vomiting or diarrhea.  He denies fever and chills.  He denies dysuria and urinary frequency.  He reports a history of diabetes but does not have a primary care physician.  He reports [facility]  He reports compliance with his hypoglycemic meds.  Nothing worsens the symptoms.  Nothing improves his symptoms.  He has not seek evaluation for his symptoms.  Past Medical History  Diagnosis Date  . Diabetes mellitus     Past Surgical History  Procedure Date  . Mandible fracture surgery     Family History  Problem Relation Age of Onset  . Diabetes Mother     History  Substance Use Topics  . Smoking status: Never Smoker   . Smokeless tobacco: Not on file  . Alcohol Use: No      Review of Systems  Gastrointestinal: Positive for abdominal pain.  All other systems reviewed and are negative.    Allergies  Review of patient's allergies indicates no known allergies.  Home Medications   Current Outpatient Rx  Name Route Sig Dispense Refill  . INSULIN ASPART 100 UNIT/ML [place] SOLN Subcutaneous Inject 0-12 Units into the skin 3 (three) times daily before meals. CBG < 80: Implement Hypoglycemia Protocol. CBG 70 - 120 (dose in units): 0 , CBG 121 - 150 (dose in units): 2, CBG 151 - 200 (dose in units): 4, CBG 201 - 250 (dose in units): 6, CBG 251 - 300 (dose in units): 8, CBG 301 - 350 (dose in units) 10, CBG 351 - 400 (dose in units): 12, CBG >  400 Call MD.     . INSULIN GLARGINE 100 UNIT/ML  SOLN Subcutaneous Inject 20 Units into the skin daily after breakfast.      . SENNOSIDES-DOCUSATE SODIUM 8.6-50 MG PO TABS Oral Take 1 tablet by mouth daily as needed. For constipation.       BP 126/72  Pulse 102  Temp(Src) 98.1 F (36.7 C) (Oral)  Resp 14  SpO2 99%  Physical Exam  Nursing note and vitals reviewed. Constitutional: He is oriented to person, place, and time. He appears well-developed and well-nourished.  HENT:  Head: Normocephalic and atraumatic.  Eyes: EOM are normal.  Neck: Normal range of motion.  Cardiovascular: Normal rate, regular rhythm, normal heart sounds and intact distal pulses.   Pulmonary/Chest: Effort normal and breath sounds normal. No respiratory distress.  Abdominal: Soft. He exhibits no distension. There is no tenderness.  Genitourinary:       No CVA tenderness  Musculoskeletal: Normal range of motion.  Neurological: He is alert and oriented to person, place, and time.  Skin: Skin is warm and dry.  Psychiatric: He has a normal mood and affect. Judgment normal.    ED Course  Procedures (including critical care time)  Labs Reviewed  CBC - Abnormal; Notable for the following:    Hemoglobin 11.9 (*)    HCT 35.6 (*)  MCV 71.8 (*)    MCH 24.0 (*)    All other components within normal limits  BASIC METABOLIC PANEL - Abnormal; Notable for the following:    Sodium 130 (*)    Chloride 94 (*)    Glucose, Bld 570 (*)    GFR calc non Af Amer 84 (*)    All other components within normal limits  URINALYSIS, ROUTINE W REFLEX MICROSCOPIC - Abnormal; Notable for the following:    Specific Gravity, Urine 1.043 (*)    Glucose, UA >1000 (*)    Ketones, ur 15 (*)    All other components within normal limits  GLUCOSE, CAPILLARY - Abnormal; Notable for the following:    Glucose-Capillary 510 (*)    All other components within normal limits  GLUCOSE, CAPILLARY - Abnormal; Notable for the following:     Glucose-Capillary 254 (*)    All other components within normal limits  URINE MICROSCOPIC-ADD ON  POCT CBG MONITORING  POCT CBG MONITORING   Ct Abdomen Pelvis Wo Contrast  07/06/2011  *RADIOLOGY REPORT*  Clinical Data: Left lower quadrant pain.  Left-sided back pain for several weeks.  No nausea, vomiting, or diarrhea.  No fever or chills.  CT ABDOMEN AND PELVIS WITHOUT CONTRAST  Technique:  Multidetector CT imaging of the abdomen and pelvis was performed following the standard protocol without intravenous contrast.  Comparison: None.  Findings: Clear lung bases. Unenhanced CT was performed per clinician order.  Lack of IV contrast limits sensitivity and specificity, especially for evaluation of abdominal/pelvic solid viscera.  Liver grossly normal.  Gallbladder appears normal.  No calcified gallstones.  Common bile duct  Grossly appears within normal limits allowing for noncontrast technique.  No renal calculi/urolithiasis.  Spleen appears normal. Stomach and small bowel within normal limits allowing for noncontrast technique.  The appendix is not identified.  Suboptimal evaluation of hollow viscera secondary to decreased body fat and noncontrast technique.  Urinary bladder appears within normal limits.  Bilateral SI joint osteoarthritis.  Pubic symphysis appears normal. Subchondral cystic changes are present in the anterior right acetabulum indicating mild osteoarthritis.  No bony central canal stenosis.  Lumbar spinal alignment is anatomic.  Vacuum disc L5-S1. Shallow partially calcified disc bulge at L5-S1.  IMPRESSION: Negative CT urogram.  Original Report Authenticated By: Dereck Ligas, M.D.   I personally reviewed the CT scan  1. Abdominal pain   2. Hyperglycemia   3. Non compliance w medication regimen       MDM  Patient is very well appearing.  However given his ongoing pain in his left abdomen that radiates to his left flank a CT was obtained to evaluate for ureterolithiasis.  No stones  were noted.  The patient does appear to feel more comfortable this time.  He was noted to be hyperglycemic in the emergency department with a blood sugar 510.  He's been given 2 L of normal saline and a 10 unit IV insulin bolus.  Will reevaluate his blood sugar.  I suspect this is secondary to a combination of noncompliance as well as dietary indiscretion.  His urine shows no signs of infection.  7:52 AM The patient feels much better.  His blood sugars down to 254.  He now reports that he has run out of his NovoLog which he did not provide a history on arrival.  The patient will be discharged home with prescriptions for both his Lantus and his NovoLog.  He's been told to followup with his primary care doctor's  Hoy Morn, MD 07/06/11 [phone]

## 2011-08-05 ENCOUNTER — Emergency Department (HOSPITAL_COMMUNITY)
Admission: EM | Admit: 2011-08-05 | Discharge: 2011-08-05 | Disposition: A | Discharge status: Home / Self Care | Payer: Self-pay | Attending: Emergency Medicine | Admitting: Emergency Medicine

## 2011-08-05 ENCOUNTER — Encounter (HOSPITAL_COMMUNITY): Payer: Self-pay

## 2011-08-05 ENCOUNTER — Emergency Department (HOSPITAL_COMMUNITY): Payer: Self-pay

## 2011-08-05 DIAGNOSIS — F411 Generalized anxiety disorder: Secondary | ICD-10-CM | POA: Insufficient documentation

## 2011-08-05 DIAGNOSIS — R109 Unspecified abdominal pain: Secondary | ICD-10-CM | POA: Insufficient documentation

## 2011-08-05 DIAGNOSIS — Z794 Long term (current) use of insulin: Secondary | ICD-10-CM | POA: Insufficient documentation

## 2011-08-05 DIAGNOSIS — R10819 Abdominal tenderness, unspecified site: Secondary | ICD-10-CM | POA: Insufficient documentation

## 2011-08-05 DIAGNOSIS — F1410 Cocaine abuse, uncomplicated: Secondary | ICD-10-CM | POA: Insufficient documentation

## 2011-08-05 DIAGNOSIS — E119 Type 2 diabetes mellitus without complications: Secondary | ICD-10-CM | POA: Insufficient documentation

## 2011-08-05 DIAGNOSIS — Z79899 Other long term (current) drug therapy: Secondary | ICD-10-CM | POA: Insufficient documentation

## 2011-08-05 DIAGNOSIS — K5900 Constipation, unspecified: Secondary | ICD-10-CM | POA: Insufficient documentation

## 2011-08-05 DIAGNOSIS — M549 Dorsalgia, unspecified: Secondary | ICD-10-CM | POA: Insufficient documentation

## 2011-08-05 LAB — COMPREHENSIVE METABOLIC PANEL
ALT: 9 U/L (ref 0–53)
AST: 11 U/L (ref 0–37)
Albumin: 3.8 g/dL (ref 3.5–5.2)
Alkaline Phosphatase: 57 U/L (ref 39–117)
BUN: 9 mg/dL (ref 6–23)
CO2: 25 mEq/L (ref 19–32)
Calcium: 9.6 mg/dL (ref 8.4–10.5)
Chloride: 98 mEq/L (ref 96–112)
Creatinine, Ser: 0.82 mg/dL (ref 0.50–1.35)
GFR calc Af Amer: >90 mL/min (ref >90)
GFR calc non Af Amer: >90 mL/min (ref >90)
Glucose, Bld: 343 mg/dL — ABNORMAL HIGH (ref 70–99)
Potassium: 4.6 mEq/L (ref 3.5–5.1)
Sodium: 134 mEq/L — ABNORMAL LOW (ref 135–145)
Total Bilirubin: 0.4 mg/dL (ref 0.3–1.2)
Total Protein: 6.8 g/dL (ref 6.0–8.3)

## 2011-08-05 LAB — DIFFERENTIAL
Basophils Absolute: 0 10*3/uL (ref 0.0–0.1)
Basophils Relative: 1 % (ref 0–1)
Eosinophils Absolute: 0 10*3/uL (ref 0.0–0.7)
Eosinophils Relative: 0 % (ref 0–5)
Lymphocytes Relative: 22 % (ref 12–46)
Lymphs Abs: 0.9 10*3/uL (ref 0.7–4.0)
Monocytes Absolute: 0.5 10*3/uL (ref 0.1–1.0)
Monocytes Relative: 13 % — ABNORMAL HIGH (ref 3–12)
Neutro Abs: 2.6 10*3/uL (ref 1.7–7.7)
Neutrophils Relative %: 65 % (ref 43–77)

## 2011-08-05 LAB — CBC
HCT: 34.5 % — ABNORMAL LOW (ref 39.0–52.0)
Hemoglobin: 11.2 g/dL — ABNORMAL LOW (ref 13.0–17.0)
MCH: 23.3 pg — ABNORMAL LOW (ref 26.0–34.0)
MCHC: 32.5 g/dL (ref 30.0–36.0)
MCV: 71.9 fL — ABNORMAL LOW (ref 78.0–100.0)
Platelets: 207 10*3/uL (ref 150–400)
RBC: 4.8 MIL/uL (ref 4.22–5.81)
RDW: 13.4 % (ref 11.5–15.5)
WBC: 4 10*3/uL (ref 4.0–10.5)

## 2011-08-05 LAB — RAPID URINE DRUG SCREEN, HOSP PERFORMED
Amphetamines: NOT DETECTED
Barbiturates: NOT DETECTED
Benzodiazepines: NOT DETECTED
Cocaine: POSITIVE — AB
Opiates: POSITIVE — AB
Tetrahydrocannabinol: POSITIVE — AB

## 2011-08-05 LAB — URINALYSIS, ROUTINE W REFLEX MICROSCOPIC
Bilirubin Urine: NEGATIVE
Glucose, UA: >1000 mg/dL — AB
Hgb urine dipstick: NEGATIVE
Ketones, ur: 15 mg/dL — AB
Leukocytes, UA: NEGATIVE
Nitrite: NEGATIVE
Protein, ur: 100 mg/dL — AB
Specific Gravity, Urine: 1.03 (ref 1.005–1.030)
Urobilinogen, UA: 0.2 mg/dL (ref 0.0–1.0)
pH: 5.5 (ref 5.0–8.0)

## 2011-08-05 LAB — LIPASE, BLOOD: Lipase: 21 U/L (ref 11–59)

## 2011-08-05 LAB — URINE MICROSCOPIC-ADD ON

## 2011-08-05 LAB — GLUCOSE, CAPILLARY: Glucose-Capillary: 331 mg/dL — ABNORMAL HIGH (ref 70–99)

## 2011-08-05 MED ORDER — OXYCODONE-ACETAMINOPHEN 5-325 MG PO TABS: 2.0000 | ORAL_TABLET | ORAL | Status: DC | PRN

## 2011-08-05 MED ORDER — SODIUM CHLORIDE 0.9 % IV SOLN: 999.0000 mL | INTRAVENOUS | Status: DC

## 2011-08-05 MED ORDER — ONDANSETRON HCL 4 MG/2ML IJ SOLN
4.0000 mg | Freq: Once | INTRAMUSCULAR | Status: AC
  Administered 2011-08-05: 4 mg via INTRAVENOUS
  Filled 2011-08-05: qty 2

## 2011-08-05 MED ORDER — HYDROMORPHONE HCL PF 1 MG/ML IJ SOLN
1.0000 mg | Freq: Once | INTRAMUSCULAR | Status: AC
  Administered 2011-08-05: 1 mg via INTRAVENOUS
  Filled 2011-08-05: qty 1

## 2011-08-05 MED ORDER — SODIUM CHLORIDE 0.9 % IV BOLUS (SEPSIS)
1000.0000 mL | Freq: Once | INTRAVENOUS | Status: AC
  Administered 2011-08-05: 1000 mL via INTRAVENOUS

## 2011-08-05 NOTE — ED Notes (Signed)
CBG 331 mg/dl

## 2011-08-05 NOTE — ED Notes (Signed)
Pt sts that he has been here four times over the past month for the same pains moving from the left side back to the right side. Today the pain is on the lower right abd and pain in the right flank area. No nausea or vomiting. Has not checked his blood sugar this morning. Wife sts every time this happens the doctor tells him that is has to do with his sugar and can't diagnose the pain.

## 2011-08-05 NOTE — ED Provider Notes (Signed)
History     CSN: Jeremiah [name]  Arrival date & time 08/05/11  0746   First MD Initiated Contact with Patient 08/05/11 [phone]      Chief Complaint  Patient presents with  . Abdominal Pain    (Consider location/radiation/quality/duration/timing/severity/associated sxs/prior treatment) HPI Jeremiah STEFFENSON is a 46 y.o. male presents with c/o right abdominal pain leading to desire to be assessed in the ED. The sx(s) have been present for several days. Additional concerns are the pain radiates to the back. Causative factors are not known. Palliative factors are that he has helped. The distress associated is moderate. The disorder has been present for several days. He describes having constipation characterized by hard bowel movements and infrequent stooling.   Past Medical History  Diagnosis Date  . Diabetes mellitus     Past Surgical History  Procedure Date  . Mandible fracture surgery   . Prostate surgery     Family History  Problem Relation Age of Onset  . Diabetes Mother     History  Substance Use Topics  . Smoking status: Never Smoker   . Smokeless tobacco: Not on file  . Alcohol Use: No      Review of Systems  All other systems reviewed and are negative.    Allergies  Review of patient's allergies indicates no known allergies.  Home Medications   Current Outpatient Rx  Name Route Sig Dispense Refill  . ACETAMINOPHEN 325 MG PO TABS Oral Take by mouth every 6 (six) hours as needed. For pain    . INSULIN ASPART 100 UNIT/ML [place] SOLN Subcutaneous Inject 0-12 Units into the skin 3 (three) times daily before meals. CBG < 80: Implement Hypoglycemia Protocol. CBG 70 - 120 (dose in units): 0 , CBG 121 - 150 (dose in units): 2, CBG 151 - 200 (dose in units): 4, CBG 201 - 250 (dose in units): 6, CBG 251 - 300 (dose in units): 8, CBG 301 - 350 (dose in units) 10, CBG 351 - 400 (dose in units): 12, CBG > 400 Call MD.    . INSULIN GLARGINE 100 UNIT/ML [place] SOLN Subcutaneous Inject  20 Units into the skin daily after breakfast.    . OXYCODONE-ACETAMINOPHEN 5-325 MG PO TABS Oral Take 2 tablets by mouth every 4 (four) hours as needed for pain. 15 tablet 0    BP 135/83  Pulse 76  Temp(Src) 97.8 F (36.6 C) (Oral)  Resp 16  Ht 6' (1.829 m)  SpO2 100%  Physical Exam  Nursing note and vitals reviewed. Constitutional: He is oriented to person, place, and time. He appears well-developed and well-nourished.  HENT:  Head: Normocephalic and atraumatic.  Right Ear: External ear normal.  Left Ear: External ear normal.  Eyes: Conjunctivae and EOM are normal. Pupils are equal, round, and reactive to light.  Neck: Normal range of motion and phonation normal. Neck supple.  Cardiovascular: Normal rate, regular rhythm, normal heart sounds and intact distal pulses.   Pulmonary/Chest: Effort normal and breath sounds normal. He exhibits no bony tenderness.  Abdominal: Soft. Normal appearance. There is no tenderness (Moderate right lower quadrant tenderness).  Genitourinary:       Right flank tenderness to percussion  Musculoskeletal: Normal range of motion.  Neurological: He is alert and oriented to person, place, and time. He has normal strength. No cranial nerve deficit or sensory deficit. He exhibits normal muscle tone. Coordination normal.  Skin: Skin is warm, dry and intact.  Psychiatric: His behavior is normal. Judgment  and thought content normal.       Anxious    ED Course  Procedures (including critical care time)  Labs Reviewed  GLUCOSE, CAPILLARY - Abnormal; Notable for the following:    Glucose-Capillary 331 (*)    All other components within normal limits  CBC - Abnormal; Notable for the following:    Hemoglobin 11.2 (*)    HCT 34.5 (*)    MCV 71.9 (*)    MCH 23.3 (*)    All other components within normal limits  DIFFERENTIAL - Abnormal; Notable for the following:    Monocytes Relative 13 (*)    All other components within normal limits  URINALYSIS,  ROUTINE W REFLEX MICROSCOPIC - Abnormal; Notable for the following:    APPearance CLOUDY (*)    Glucose, UA >1000 (*)    Ketones, ur 15 (*)    Protein, ur 100 (*)    All other components within normal limits  COMPREHENSIVE METABOLIC PANEL - Abnormal; Notable for the following:    Sodium 134 (*)    Glucose, Bld 343 (*)    All other components within normal limits  URINE RAPID DRUG SCREEN (HOSP PERFORMED) - Abnormal; Notable for the following:    Opiates POSITIVE (*)    Cocaine POSITIVE (*)    Tetrahydrocannabinol POSITIVE (*)    All other components within normal limits  LIPASE, BLOOD  URINE MICROSCOPIC-ADD ON   Dg Abd Acute W/chest  08/05/2011  *RADIOLOGY REPORT*  Clinical Data: Abdominal pain  ACUTE ABDOMEN SERIES (ABDOMEN 2 VIEW & CHEST 1 VIEW)  Comparison: CT 07/06/2011  Findings: Normal cardiac silhouette.  Lungs are clear.  No free air beneath hemidiaphragms.  No dilated loops of large or small bowel.  Gas and stool rectosigmoid colon.  Two rounded vascular calcifications noted in the right lower pelvis.  No pathologic calcifications are noted.  IMPRESSION:  1.  Normal chest radiograph. 2.  No evidence of bowel obstruction or intraperitoneal free air. 3.  No acute findings evident.  Original Report Authenticated By: Suzy Bouchard, M.D.     1. Abdominal pain   2. Constipation   3. Cocaine abuse       MDM  Nonspecific abdominal pain with poly substance abuse. Pt with recent normal CT Ab/Pelvis. Doubt serious infectious process or inflammation.        Richarda Blade, MD 08/05/11 2152

## 2011-08-15 ENCOUNTER — Encounter (HOSPITAL_COMMUNITY): Payer: Self-pay | Admitting: Emergency Medicine

## 2011-08-15 ENCOUNTER — Emergency Department (HOSPITAL_COMMUNITY): Payer: Self-pay

## 2011-08-15 ENCOUNTER — Emergency Department (HOSPITAL_COMMUNITY)
Admission: EM | Admit: 2011-08-15 | Discharge: 2011-08-15 | Disposition: A | Discharge status: Home / Self Care | Payer: Self-pay | Attending: Emergency Medicine | Admitting: Emergency Medicine

## 2011-08-15 DIAGNOSIS — Z79899 Other long term (current) drug therapy: Secondary | ICD-10-CM | POA: Insufficient documentation

## 2011-08-15 DIAGNOSIS — R7309 Other abnormal glucose: Secondary | ICD-10-CM | POA: Insufficient documentation

## 2011-08-15 DIAGNOSIS — Z794 Long term (current) use of insulin: Secondary | ICD-10-CM | POA: Insufficient documentation

## 2011-08-15 DIAGNOSIS — R109 Unspecified abdominal pain: Secondary | ICD-10-CM | POA: Insufficient documentation

## 2011-08-15 LAB — URINE MICROSCOPIC-ADD ON

## 2011-08-15 LAB — COMPREHENSIVE METABOLIC PANEL
ALT: 10 U/L (ref 0–53)
AST: 12 U/L (ref 0–37)
Albumin: 3.8 g/dL (ref 3.5–5.2)
Alkaline Phosphatase: 56 U/L (ref 39–117)
BUN: 15 mg/dL (ref 6–23)
CO2: 26 mEq/L (ref 19–32)
Calcium: 9.6 mg/dL (ref 8.4–10.5)
Chloride: 95 mEq/L — ABNORMAL LOW (ref 96–112)
Creatinine, Ser: 0.86 mg/dL (ref 0.50–1.35)
GFR calc Af Amer: >90 mL/min (ref >90)
GFR calc non Af Amer: >90 mL/min (ref >90)
Glucose, Bld: 356 mg/dL — ABNORMAL HIGH (ref 70–99)
Potassium: 4.5 mEq/L (ref 3.5–5.1)
Sodium: 129 mEq/L — ABNORMAL LOW (ref 135–145)
Total Bilirubin: 0.4 mg/dL (ref 0.3–1.2)
Total Protein: 7 g/dL (ref 6.0–8.3)

## 2011-08-15 LAB — URINALYSIS, ROUTINE W REFLEX MICROSCOPIC
Bilirubin Urine: NEGATIVE
Glucose, UA: >1000 mg/dL — AB
Hgb urine dipstick: NEGATIVE
Ketones, ur: 15 mg/dL — AB
Leukocytes, UA: NEGATIVE
Nitrite: NEGATIVE
Protein, ur: 30 mg/dL — AB
Specific Gravity, Urine: 1.037 — ABNORMAL HIGH (ref 1.005–1.030)
Urobilinogen, UA: 0.2 mg/dL (ref 0.0–1.0)
pH: 5.5 (ref 5.0–8.0)

## 2011-08-15 LAB — CBC
HCT: 36.3 % — ABNORMAL LOW (ref 39.0–52.0)
Hemoglobin: 12.2 g/dL — ABNORMAL LOW (ref 13.0–17.0)
MCH: 24 pg — ABNORMAL LOW (ref 26.0–34.0)
MCHC: 33.6 g/dL (ref 30.0–36.0)
MCV: 71.5 fL — ABNORMAL LOW (ref 78.0–100.0)
Platelets: 228 10*3/uL (ref 150–400)
RBC: 5.08 MIL/uL (ref 4.22–5.81)
RDW: 13.4 % (ref 11.5–15.5)
WBC: 3.9 10*3/uL — ABNORMAL LOW (ref 4.0–10.5)

## 2011-08-15 LAB — DIFFERENTIAL
Basophils Absolute: 0 10*3/uL (ref 0.0–0.1)
Basophils Relative: 0 % (ref 0–1)
Eosinophils Absolute: 0 10*3/uL (ref 0.0–0.7)
Eosinophils Relative: 1 % (ref 0–5)
Lymphocytes Relative: 28 % (ref 12–46)
Lymphs Abs: 1.1 10*3/uL (ref 0.7–4.0)
Monocytes Absolute: 0.4 10*3/uL (ref 0.1–1.0)
Monocytes Relative: 9 % (ref 3–12)
Neutro Abs: 2.4 10*3/uL (ref 1.7–7.7)
Neutrophils Relative %: 62 % (ref 43–77)

## 2011-08-15 LAB — RAPID URINE DRUG SCREEN, HOSP PERFORMED
Amphetamines: NOT DETECTED
Barbiturates: NOT DETECTED
Benzodiazepines: NOT DETECTED
Cocaine: NOT DETECTED
Opiates: NOT DETECTED
Tetrahydrocannabinol: POSITIVE — AB

## 2011-08-15 LAB — GLUCOSE, CAPILLARY: Glucose-Capillary: 283 mg/dL — ABNORMAL HIGH (ref 70–99)

## 2011-08-15 LAB — LIPASE, BLOOD: Lipase: 23 U/L (ref 11–59)

## 2011-08-15 MED ORDER — MORPHINE SULFATE 4 MG/ML IJ SOLN
4.0000 mg | Freq: Once | INTRAMUSCULAR | Status: AC
  Administered 2011-08-15: 4 mg via INTRAVENOUS
  Filled 2011-08-15: qty 1

## 2011-08-15 MED ORDER — SODIUM CHLORIDE 0.9 % IV BOLUS (SEPSIS)
1000.0000 mL | Freq: Once | INTRAVENOUS | Status: AC
  Administered 2011-08-15: 1000 mL via INTRAVENOUS

## 2011-08-15 MED ORDER — METOCLOPRAMIDE HCL 10 MG PO TABS: 10.0000 mg | ORAL_TABLET | Freq: Four times a day (QID) | ORAL | Status: AC

## 2011-08-15 MED ORDER — IOHEXOL 300 MG/ML  SOLN
40.0000 mL | Freq: Once | INTRAMUSCULAR | Status: AC | PRN
  Administered 2011-08-15: 40 mL via ORAL

## 2011-08-15 MED ORDER — IOHEXOL 300 MG/ML  SOLN
100.0000 mL | Freq: Once | INTRAMUSCULAR | Status: AC | PRN
  Administered 2011-08-15: 100 mL via INTRAVENOUS

## 2011-08-15 MED ORDER — ONDANSETRON HCL 4 MG/2ML IJ SOLN
4.0000 mg | Freq: Once | INTRAMUSCULAR | Status: AC
  Administered 2011-08-15: 4 mg via INTRAVENOUS
  Filled 2011-08-15: qty 2

## 2011-08-15 MED ORDER — OXYCODONE-ACETAMINOPHEN 5-325 MG PO TABS: 1.0000 | ORAL_TABLET | ORAL | Status: AC | PRN

## 2011-08-15 NOTE — ED Notes (Signed)
Pt is resting in bed with friend at bedside; c/o constant nagging 10/10. PT reports this is an ongoing for several weeks and feels better after at bowel movement.

## 2011-08-15 NOTE — ED Notes (Signed)
Pt started drinking PO contrast.

## 2011-08-15 NOTE — ED Notes (Signed)
Pt c/o right sided flank pain intermittently x several months; pt sts seen here for same in past

## 2011-08-15 NOTE — ED Notes (Signed)
Pt denies any N/V/D with the pain, no urinary symptoms. Seen here recently for the same pain.

## 2011-08-15 NOTE — ED Notes (Signed)
Patient transported to CT 

## 2011-08-15 NOTE — Discharge Instructions (Signed)
FOLLOW UP WITH [facility] ([facility]) AND/OR WITH DR. MANN FOR FURTHER OUTPATIENT EVALUATION OF PERSISTENT PAIN. TAKE MEDICATIONS AS PRESCRIBED. RETURN HERE WITH SEVERE PAIN, HIGH FEVER, BLOODY BOWEL MOVEMENTS OR OTHER CONCERN.  Abdominal Pain Abdominal pain can be caused by many things. Your caregiver decides the seriousness of your pain by an examination and possibly blood tests and X-rays. Many cases can be observed and treated at home. Most abdominal pain is not caused by a disease and will probably improve without treatment. However, in many cases, more time must pass before a clear cause of the pain can be found. Before that point, it may not be known if you need more testing, or if hospitalization or surgery is needed. HOME CARE INSTRUCTIONS   Do not take laxatives unless directed by your caregiver.   Take pain medicine only as directed by your caregiver.   Only take over-the-counter or prescription medicines for pain, discomfort, or fever as directed by your caregiver.   Try a clear liquid diet (broth, tea, or water) for as long as directed by your caregiver. Slowly move to a bland diet as tolerated.  SEEK IMMEDIATE MEDICAL CARE IF:   The pain does not go away.   You have a fever.   You keep throwing up (vomiting).   The pain is felt only in portions of the abdomen. Pain in the right side could possibly be appendicitis. In an adult, pain in the left lower portion of the abdomen could be colitis or diverticulitis.   You pass bloody or black tarry stools.  MAKE SURE YOU:   Understand these instructions.   Will watch your condition.   Will get help right away if you are not doing well or get worse.  Document Released: 03/23/2005 Document Revised: 02/23/2011 Document Reviewed: 01/30/2008 [facility] Patient Information 2012 [place].

## 2011-08-15 NOTE — ED Provider Notes (Signed)
Medical screening examination/treatment/procedure(s) were conducted as a shared visit with non-physician practitioner(s) and myself.  I personally evaluated the patient during the encounter  Pt seen and evaluated.  Pt with pain in abdomen and flank which has been ongoing for several weeks.  CT scan today revealed no acute findings.  Labs reassuring, some hyperglycemia which improved with fluids.  Pt discharged- he is arranging follow up with [facility], and was given GI referral.    Threasa Beards, MD 08/15/11 2128

## 2011-08-15 NOTE — ED Provider Notes (Signed)
History     CSN: [id number]  Arrival date & time 08/15/11  1451   First MD Initiated Contact with Patient 08/15/11 1648      Chief Complaint  Patient presents with  . Flank Pain    (Consider location/radiation/quality/duration/timing/severity/associated sxs/prior treatment) Patient is a 46 y.o. male presenting with flank pain. The history is provided by the patient.  Flank Pain This is a chronic problem. The current episode started more than 1 month ago. The problem occurs daily. Associated symptoms include abdominal pain. Pertinent negatives include no change in bowel habit, chills, fever, nausea or vomiting. Associated symptoms comments: He has had pain across the central abdomen and right greater than left flank for the past 4 months. He denies nausea or vomiting, no fever. He reports it is worse with eating so that he avoids eating anything and has had a 50 pound weight loss during the 4 months the symptoms have been present. No blood in bowel movements or complaint of melena. . The symptoms are aggravated by eating. He has tried nothing (He reports having been seen in the emergency room on numerous occasions and has only now been able to arrange follow up with [facility] ([facility] ) for the symptoms.    Past Medical History  Diagnosis Date  . Diabetes mellitus     Past Surgical History  Procedure Date  . Mandible fracture surgery   . Prostate surgery     Family History  Problem Relation Age of Onset  . Diabetes Mother     History  Substance Use Topics  . Smoking status: Never Smoker   . Smokeless tobacco: Not on file  . Alcohol Use: No      Review of Systems  Constitutional: Positive for unexpected weight change. Negative for fever and chills.  HENT: Negative.   Respiratory: Negative.   Cardiovascular: Negative.   Gastrointestinal: Positive for abdominal pain. Negative for nausea, vomiting and change in bowel habit.  Genitourinary: Positive for flank  pain.  Musculoskeletal: Negative.   Skin: Negative.   Neurological: Negative.     Allergies  Review of patient's allergies indicates no known allergies.  Home Medications   Current Outpatient Rx  Name Route Sig Dispense Refill  . ACETAMINOPHEN 325 MG PO TABS Oral Take 325 mg by mouth every 6 (six) hours as needed. For pain    . INSULIN ASPART 100 UNIT/ML [place] SOLN Subcutaneous Inject 0-12 Units into the skin 3 (three) times daily before meals. CBG < 80: Implement Hypoglycemia Protocol. CBG 70 - 120 (dose in units): 0 , CBG 121 - 150 (dose in units): 2, CBG 151 - 200 (dose in units): 4, CBG 201 - 250 (dose in units): 6, CBG 251 - 300 (dose in units): 8, CBG 301 - 350 (dose in units) 10, CBG 351 - 400 (dose in units): 12, CBG > 400 Call MD.    . INSULIN GLARGINE 100 UNIT/ML [place] SOLN Subcutaneous Inject 20 Units into the skin daily after breakfast.      BP 124/80  Pulse 86  Temp(Src) 98.2 F (36.8 C) (Oral)  Resp 20  SpO2 100%  Physical Exam  Constitutional: He appears well-developed and well-nourished.  HENT:  Head: Normocephalic.  Neck: Normal range of motion. Neck supple.  Cardiovascular: Normal rate and regular rhythm.   Pulmonary/Chest: Effort normal and breath sounds normal.  Abdominal: Soft. Bowel sounds are normal. There is no tenderness. There is no rebound and no guarding.  Genitourinary:  No CVA tenderness.  Musculoskeletal: Normal range of motion.  Neurological: He is alert. No cranial nerve deficit.  Skin: Skin is warm and dry. No rash noted.  Psychiatric: He has a normal mood and affect.    ED Course  Procedures (including critical care time)  Labs Reviewed - No data to display No results found. Results for orders placed during the hospital encounter of 08/15/11  CBC      Component Value Range   WBC 3.9 (*) 4.0 - 10.5 (K/uL)   RBC 5.08  4.22 - 5.81 (MIL/uL)   Hemoglobin 12.2 (*) 13.0 - 17.0 (g/dL)   HCT 36.3 (*) 39.0 - 52.0 (%)   MCV 71.5 (*) 78.0 -  100.0 (fL)   MCH 24.0 (*) 26.0 - 34.0 (pg)   MCHC 33.6  30.0 - 36.0 (g/dL)   RDW 13.4  11.5 - 15.5 (%)   Platelets 228  150 - 400 (K/uL)  DIFFERENTIAL      Component Value Range   Neutrophils Relative 62  43 - 77 (%)   Lymphocytes Relative 28  12 - 46 (%)   Monocytes Relative 9  3 - 12 (%)   Eosinophils Relative 1  0 - 5 (%)   Basophils Relative 0  0 - 1 (%)   Neutro Abs 2.4  1.7 - 7.7 (K/uL)   Lymphs Abs 1.1  0.7 - 4.0 (K/uL)   Monocytes Absolute 0.4  0.1 - 1.0 (K/uL)   Eosinophils Absolute 0.0  0.0 - 0.7 (K/uL)   Basophils Absolute 0.0  0.0 - 0.1 (K/uL)   RBC Morphology ELLIPTOCYTES    COMPREHENSIVE METABOLIC PANEL      Component Value Range   Sodium 129 (*) 135 - 145 (mEq/L)   Potassium 4.5  3.5 - 5.1 (mEq/L)   Chloride 95 (*) 96 - 112 (mEq/L)   CO2 26  19 - 32 (mEq/L)   Glucose, Bld 356 (*) 70 - 99 (mg/dL)   BUN 15  6 - 23 (mg/dL)   Creatinine, Ser 0.86  0.50 - 1.35 (mg/dL)   Calcium 9.6  8.4 - 10.5 (mg/dL)   Total Protein 7.0  6.0 - 8.3 (g/dL)   Albumin 3.8  3.5 - 5.2 (g/dL)   AST 12  0 - 37 (U/L)   ALT 10  0 - 53 (U/L)   Alkaline Phosphatase 56  39 - 117 (U/L)   Total Bilirubin 0.4  0.3 - 1.2 (mg/dL)   GFR calc non Af Amer >90  >90 (mL/min)   GFR calc Af Amer >90  >90 (mL/min)  LIPASE, BLOOD      Component Value Range   Lipase 23  11 - 59 (U/L)  URINALYSIS, ROUTINE W REFLEX MICROSCOPIC      Component Value Range   Color, Urine YELLOW  YELLOW    APPearance CLEAR  CLEAR    Specific Gravity, Urine 1.037 (*) 1.005 - 1.030    pH 5.5  5.0 - 8.0    Glucose, UA >1000 (*) NEGATIVE (mg/dL)   Hgb urine dipstick NEGATIVE  NEGATIVE    Bilirubin Urine NEGATIVE  NEGATIVE    Ketones, ur 15 (*) NEGATIVE (mg/dL)   Protein, ur 30 (*) NEGATIVE (mg/dL)   Urobilinogen, UA 0.2  0.0 - 1.0 (mg/dL)   Nitrite NEGATIVE  NEGATIVE    Leukocytes, UA NEGATIVE  NEGATIVE   URINE RAPID DRUG SCREEN (HOSP PERFORMED)      Component Value Range   Opiates NONE DETECTED  NONE DETECTED  Cocaine  NONE DETECTED  NONE DETECTED    Benzodiazepines NONE DETECTED  NONE DETECTED    Amphetamines NONE DETECTED  NONE DETECTED    Tetrahydrocannabinol POSITIVE (*) NONE DETECTED    Barbiturates NONE DETECTED  NONE DETECTED   URINE MICROSCOPIC-ADD ON      Component Value Range   Squamous Epithelial / LPF RARE  RARE    WBC, UA 0-2  <3 (WBC/hpf)  GLUCOSE, CAPILLARY      Component Value Range   Glucose-Capillary 283 (*) 70 - 99 (mg/dL)   Comment 1 Documented in Chart     Comment 2 Notify RN       No diagnosis found.  Ct Abdomen Pelvis W Contrast  08/15/2011  *RADIOLOGY REPORT*  Clinical Data: Right and left-sided flank pain  CT ABDOMEN AND PELVIS WITH CONTRAST  Technique:  Multidetector CT imaging of the abdomen and pelvis was performed following the standard protocol during bolus administration of intravenous contrast.  Contrast: 22mL OMNIPAQUE IOHEXOL 300 MG/ML IV SOLN, 124mL OMNIPAQUE IOHEXOL 300 MG/ML IV SOLN  Comparison: CT abdomen pelvis of 07/06/2011  Findings: The lung bases are clear.  The liver enhances with no focal abnormality and no ductal dilatation is seen.  No calcified gallstones are noted.  The pancreas is normal in size and the pancreatic duct is not dilated.  The adrenal glands and spleen are unremarkable.  The stomach is moderately distended with contrast material and is unremarkable.  The kidneys enhance with no calculus or mass and no hydronephrosis is seen.  The abdominal aorta is normal in caliber.  The urinary bladder is not well distended.  The prostate is normal in size.  No fluid is seen within the pelvis.  No abnormality of the colon is seen.  Neither the appendix nor the terminal ileum are well visualized.  No bony abnormality is seen.  There is mild degenerative disc disease at L5-S1.  IMPRESSION:  1.  No explanation for the patient's pain is seen.  No renal calculi are noted. 2.  Mild degenerative disc disease at L5-S1.  Original Report Authenticated By: Joretta Bachelor,  M.D.   Dg Abd Acute W/chest  08/05/2011  *RADIOLOGY REPORT*  Clinical Data: Abdominal pain  ACUTE ABDOMEN SERIES (ABDOMEN 2 VIEW & CHEST 1 VIEW)  Comparison: CT 07/06/2011  Findings: Normal cardiac silhouette.  Lungs are clear.  No free air beneath hemidiaphragms.  No dilated loops of large or small bowel.  Gas and stool rectosigmoid colon.  Two rounded vascular calcifications noted in the right lower pelvis.  No pathologic calcifications are noted.  IMPRESSION:  1.  Normal chest radiograph. 2.  No evidence of bowel obstruction or intraperitoneal free air. 3.  No acute findings evident.  Original Report Authenticated By: Suzy Bouchard, M.D.    MDM          Leotis Shames, PA-C 08/15/11 2107

## 2011-08-15 NOTE — ED Notes (Signed)
Pts CBG 283

## 2011-08-15 NOTE — ED Notes (Signed)
PT ambulated with a steady gait; VSS; A&Ox3; respirations even and unlabored; skin warm and dry; reports feeling much better; no complaints or questions at this time.

## 2011-10-11 ENCOUNTER — Emergency Department (HOSPITAL_COMMUNITY)
Admission: EM | Admit: 2011-10-11 | Discharge: 2011-10-12 | Disposition: A | Discharge status: Home / Self Care | Payer: Self-pay | Attending: Emergency Medicine | Admitting: Emergency Medicine

## 2011-10-11 ENCOUNTER — Encounter (HOSPITAL_COMMUNITY): Payer: Self-pay | Admitting: Emergency Medicine

## 2011-10-11 DIAGNOSIS — E119 Type 2 diabetes mellitus without complications: Secondary | ICD-10-CM | POA: Insufficient documentation

## 2011-10-11 DIAGNOSIS — R1031 Right lower quadrant pain: Secondary | ICD-10-CM | POA: Insufficient documentation

## 2011-10-11 NOTE — ED Notes (Signed)
PT. REPORTS RIGHT FLANK PAIN ONSET LAST NIGHT , DENIES DYSURIA OR HEMATURIA.

## 2011-10-12 ENCOUNTER — Emergency Department (HOSPITAL_COMMUNITY): Payer: Self-pay

## 2011-10-12 LAB — URINALYSIS, ROUTINE W REFLEX MICROSCOPIC
Glucose, UA: 500 mg/dL — AB
Hgb urine dipstick: NEGATIVE
Ketones, ur: 15 mg/dL — AB
Leukocytes, UA: NEGATIVE
Nitrite: NEGATIVE
Protein, ur: 100 mg/dL — AB
Specific Gravity, Urine: 1.039 — ABNORMAL HIGH (ref 1.005–1.030)
Urobilinogen, UA: 1 mg/dL (ref 0.0–1.0)
pH: 6 (ref 5.0–8.0)

## 2011-10-12 LAB — COMPREHENSIVE METABOLIC PANEL
ALT: 13 U/L (ref 0–53)
AST: 27 U/L (ref 0–37)
Albumin: 4 g/dL (ref 3.5–5.2)
Alkaline Phosphatase: 54 U/L (ref 39–117)
BUN: 12 mg/dL (ref 6–23)
CO2: 27 mEq/L (ref 19–32)
Calcium: 9.7 mg/dL (ref 8.4–10.5)
Chloride: 101 mEq/L (ref 96–112)
Creatinine, Ser: 1.02 mg/dL (ref 0.50–1.35)
GFR calc Af Amer: >90 mL/min (ref >90)
GFR calc non Af Amer: 86 mL/min — ABNORMAL LOW (ref >90)
Glucose, Bld: 94 mg/dL (ref 70–99)
Potassium: 4 mEq/L (ref 3.5–5.1)
Sodium: 137 mEq/L (ref 135–145)
Total Bilirubin: 0.3 mg/dL (ref 0.3–1.2)
Total Protein: 7.1 g/dL (ref 6.0–8.3)

## 2011-10-12 LAB — DIFFERENTIAL
Basophils Absolute: 0 10*3/uL (ref 0.0–0.1)
Basophils Relative: 0 % (ref 0–1)
Eosinophils Absolute: 0 10*3/uL (ref 0.0–0.7)
Eosinophils Relative: 0 % (ref 0–5)
Lymphocytes Relative: 22 % (ref 12–46)
Lymphs Abs: 1.2 10*3/uL (ref 0.7–4.0)
Monocytes Absolute: 0.5 10*3/uL (ref 0.1–1.0)
Monocytes Relative: 9 % (ref 3–12)
Neutro Abs: 3.7 10*3/uL (ref 1.7–7.7)
Neutrophils Relative %: 69 % (ref 43–77)

## 2011-10-12 LAB — LIPASE, BLOOD: Lipase: 13 U/L (ref 11–59)

## 2011-10-12 LAB — URINE MICROSCOPIC-ADD ON

## 2011-10-12 LAB — CBC
HCT: 35.8 % — ABNORMAL LOW (ref 39.0–52.0)
Hemoglobin: 12.4 g/dL — ABNORMAL LOW (ref 13.0–17.0)
MCH: 24.8 pg — ABNORMAL LOW (ref 26.0–34.0)
MCHC: 34.6 g/dL (ref 30.0–36.0)
MCV: 71.6 fL — ABNORMAL LOW (ref 78.0–100.0)
Platelets: 261 10*3/uL (ref 150–400)
RBC: 5 MIL/uL (ref 4.22–5.81)
RDW: 14.1 % (ref 11.5–15.5)
WBC: 5.4 10*3/uL (ref 4.0–10.5)

## 2011-10-12 MED ORDER — HYDROMORPHONE HCL PF 1 MG/ML IJ SOLN
1.0000 mg | Freq: Once | INTRAMUSCULAR | Status: AC
  Administered 2011-10-12: 1 mg via INTRAVENOUS
  Filled 2011-10-12: qty 1

## 2011-10-12 MED ORDER — ONDANSETRON HCL 4 MG/2ML IJ SOLN
4.0000 mg | Freq: Once | INTRAMUSCULAR | Status: AC
  Administered 2011-10-12: 4 mg via INTRAVENOUS
  Filled 2011-10-12: qty 2

## 2011-10-12 MED ORDER — IOHEXOL 300 MG/ML  SOLN
100.0000 mL | Freq: Once | INTRAMUSCULAR | Status: AC | PRN
  Administered 2011-10-12: 100 mL via INTRAVENOUS

## 2011-10-12 MED ORDER — SODIUM CHLORIDE 0.9 % IV BOLUS (SEPSIS)
1000.0000 mL | Freq: Once | INTRAVENOUS | Status: AC
  Administered 2011-10-12: 1000 mL via INTRAVENOUS

## 2011-10-12 MED ORDER — IOHEXOL 300 MG/ML  SOLN: 20.0000 mL | INTRAMUSCULAR | Status: AC

## 2011-10-12 NOTE — Discharge Instructions (Signed)

## 2011-10-12 NOTE — ED Notes (Signed)
Gave pt a coke to drink. Removed iv before pt left for discharge

## 2011-10-12 NOTE — ED Notes (Addendum)
Pt c/o RLQ pain radiating to (R) flank pain and some nausea x1 day. Pt denies problems w/urination, V/D, or hx of kidney stones. Pt reports hx of the same, reports he was admitted to the hospital for the same in November.

## 2011-10-12 NOTE — ED Provider Notes (Signed)
History     CSN: [id number]  Arrival date & time 10/11/11  2133   First MD Initiated Contact with Patient 10/12/11 0115      Chief Complaint  Patient presents with  . Flank Pain    (Consider location/radiation/quality/duration/timing/severity/associated sxs/prior treatment) HPI Comments: Patient presents with right-sided abdominal pain he has had intermittently for the past 5 months. He got acutely worse tonight a sharp stabbing pain in his right lower quadrant and right flank. He denies any nausea, vomiting, diarrhea, dysuria, hematuria. He's been ER previously for this pain had negative CT scan. States also seen gastroenterology but did not have an explanation for his pain. This pain is similar to what is experienced in the past. No history of kidney stones. Taking only Tylenol for the pain with minimal relief. No change in bowel habits.  The history is provided by the patient and a relative.    Past Medical History  Diagnosis Date  . Diabetes mellitus     Past Surgical History  Procedure Date  . Mandible fracture surgery   . Prostate surgery     Family History  Problem Relation Age of Onset  . Diabetes Mother     History  Substance Use Topics  . Smoking status: Never Smoker   . Smokeless tobacco: Not on file  . Alcohol Use: No      Review of Systems  Constitutional: Positive for activity change and appetite change. Negative for fever and fatigue.  HENT: Negative for congestion and rhinorrhea.   Respiratory: Negative for cough, chest tightness and shortness of breath.   Cardiovascular: Negative for chest pain.  Gastrointestinal: Positive for abdominal pain. Negative for nausea, vomiting and diarrhea.  Genitourinary: Positive for flank pain. Negative for penile swelling and testicular pain.  Musculoskeletal: Positive for back pain.  Skin: Negative for rash.  Neurological: Negative for dizziness and headaches.    Allergies  Review of patient's allergies  indicates no known allergies.  Home Medications   Current Outpatient Rx  Name Route Sig Dispense Refill  . ACETAMINOPHEN 325 MG PO TABS Oral Take 325 mg by mouth every 6 (six) hours as needed. For pain    . INSULIN ASPART 100 UNIT/ML [place] SOLN Subcutaneous Inject 0-12 Units into the skin 3 (three) times daily before meals. CBG < 80: Implement Hypoglycemia Protocol. CBG 70 - 120 (dose in units): 0 , CBG 121 - 150 (dose in units): 2, CBG 151 - 200 (dose in units): 4, CBG 201 - 250 (dose in units): 6, CBG 251 - 300 (dose in units): 8, CBG 301 - 350 (dose in units) 10, CBG 351 - 400 (dose in units): 12, CBG > 400 Call MD.    . INSULIN GLARGINE 100 UNIT/ML [place] SOLN Subcutaneous Inject 20 Units into the skin daily after breakfast.    . VITAMIN B-12 500 MCG PO TABS Oral Take 500 mcg by mouth 3 (three) times daily.      BP 146/79  Pulse 80  Temp(Src) 97.7 F (36.5 C) (Oral)  Resp 18  SpO2 100%  Physical Exam  Constitutional: He is oriented to person, place, and time. He appears well-developed and well-nourished. No distress.  HENT:  Head: Normocephalic and atraumatic.  Mouth/Throat: Oropharynx is clear and moist. No oropharyngeal exudate.  Eyes: Conjunctivae are normal. Pupils are equal, round, and reactive to light.  Neck: Normal range of motion. Neck supple.  Cardiovascular: Normal rate, regular rhythm and normal heart sounds.   No murmur heard. Pulmonary/Chest: Effort  normal and breath sounds normal. No respiratory distress.  Abdominal: Soft. There is tenderness. There is no rebound and no guarding.       Right-sided abdominal pain without guarding or rebound  Musculoskeletal: Normal range of motion. He exhibits no edema and no tenderness.       No CVA pain  Neurological: He is alert and oriented to person, place, and time. No cranial nerve deficit.  Skin: Skin is warm.    ED Course  Procedures (including critical care time)  Labs Reviewed  URINALYSIS, ROUTINE W REFLEX MICROSCOPIC -  Abnormal; Notable for the following:    Color, Urine AMBER (*) BIOCHEMICALS MAY BE AFFECTED BY COLOR   Specific Gravity, Urine 1.039 (*)    Glucose, UA 500 (*)    Bilirubin Urine SMALL (*)    Ketones, ur 15 (*)    Protein, ur 100 (*)    All other components within normal limits  CBC - Abnormal; Notable for the following:    Hemoglobin 12.4 (*)    HCT 35.8 (*)    MCV 71.6 (*)    MCH 24.8 (*)    All other components within normal limits  COMPREHENSIVE METABOLIC PANEL - Abnormal; Notable for the following:    GFR calc non Af Amer 86 (*)    All other components within normal limits  URINE MICROSCOPIC-ADD ON  DIFFERENTIAL  LIPASE, BLOOD   Ct Abdomen Pelvis W Contrast  10/12/2011  *RADIOLOGY REPORT*  Clinical Data: Right flank pain and right lower quadrant abdominal pain.  CT ABDOMEN AND PELVIS WITH CONTRAST  Technique:  Multidetector CT imaging of the abdomen and pelvis was performed following the standard protocol during bolus administration of intravenous contrast.  Contrast: 143mL OMNIPAQUE IOHEXOL 300 MG/ML  SOLN  Comparison: CT of the abdomen and pelvis performed 08/15/2011  Findings: The visualized lung bases are clear.  The liver and spleen are unremarkable in appearance.  The gallbladder is within normal limits.  The pancreas and adrenal glands are unremarkable.  The kidneys are unremarkable in appearance.  There is no evidence of hydronephrosis.  No renal or ureteral stones are seen.  No perinephric stranding is appreciated.  No free fluid is identified.  The small bowel is unremarkable in appearance.  The stomach is within normal limits.  No acute vascular abnormalities are seen.  The appendix appears normal in caliber on coronal images, without evidence for appendicitis.  The colon is grossly unremarkable in appearance, though the sigmoid colon is not well assessed due to surrounding structures.  The bladder is mildly distended and grossly unremarkable in appearance, though difficult to  fully assess due to surrounding structures.  The prostate remains normal in size.  No inguinal lymphadenopathy is seen.  No acute osseous abnormalities are identified.  IMPRESSION: Unremarkable contrast CT of the abdomen and pelvis.  Original Report Authenticated By: Santa Lighter, M.D.     1. Abdominal pain       MDM  Acute on chronic right-sided abdominal pain, abdomen benign. Previous CT reviewed.  Labwork unremarkable. Urinalysis clean.  Right-sided abdominal pain it radiates to the back and flank.  No explanation for patient's pain.  Followup with gastroenterology as scheduled         Ezequiel Essex, MD 10/12/11 [phone]

## 2012-04-11 ENCOUNTER — Emergency Department (HOSPITAL_COMMUNITY)
Admission: EM | Admit: 2012-04-11 | Discharge: 2012-04-11 | Disposition: A | Discharge status: Home / Self Care | Payer: Self-pay | Attending: Emergency Medicine | Admitting: Emergency Medicine

## 2012-04-11 ENCOUNTER — Encounter (HOSPITAL_COMMUNITY): Payer: Self-pay | Admitting: *Deleted

## 2012-04-11 DIAGNOSIS — Z794 Long term (current) use of insulin: Secondary | ICD-10-CM | POA: Insufficient documentation

## 2012-04-11 DIAGNOSIS — E119 Type 2 diabetes mellitus without complications: Secondary | ICD-10-CM | POA: Insufficient documentation

## 2012-04-11 DIAGNOSIS — R739 Hyperglycemia, unspecified: Secondary | ICD-10-CM

## 2012-04-11 DIAGNOSIS — R109 Unspecified abdominal pain: Secondary | ICD-10-CM | POA: Insufficient documentation

## 2012-04-11 LAB — CBC WITH DIFFERENTIAL/PLATELET
Basophils Absolute: 0 10*3/uL (ref 0.0–0.1)
Basophils Relative: 0 % (ref 0–1)
Eosinophils Absolute: 0 10*3/uL (ref 0.0–0.7)
Eosinophils Relative: 0 % (ref 0–5)
HCT: 38.3 % — ABNORMAL LOW (ref 39.0–52.0)
Hemoglobin: 13 g/dL (ref 13.0–17.0)
Lymphocytes Relative: 25 % (ref 12–46)
Lymphs Abs: 1.3 10*3/uL (ref 0.7–4.0)
MCH: 24.6 pg — ABNORMAL LOW (ref 26.0–34.0)
MCHC: 33.9 g/dL (ref 30.0–36.0)
MCV: 72.4 fL — ABNORMAL LOW (ref 78.0–100.0)
Monocytes Absolute: 0.5 10*3/uL (ref 0.1–1.0)
Monocytes Relative: 10 % (ref 3–12)
Neutro Abs: 3.4 10*3/uL (ref 1.7–7.7)
Neutrophils Relative %: 65 % (ref 43–77)
Platelets: 218 10*3/uL (ref 150–400)
RBC: 5.29 MIL/uL (ref 4.22–5.81)
RDW: 13.3 % (ref 11.5–15.5)
WBC: 5.2 10*3/uL (ref 4.0–10.5)

## 2012-04-11 LAB — GLUCOSE, CAPILLARY
Glucose-Capillary: 445 mg/dL — ABNORMAL HIGH (ref 70–99)
Glucose-Capillary: 397 mg/dL — ABNORMAL HIGH (ref 70–99)
Glucose-Capillary: 231 mg/dL — ABNORMAL HIGH (ref 70–99)

## 2012-04-11 LAB — URINALYSIS, ROUTINE W REFLEX MICROSCOPIC
Bilirubin Urine: NEGATIVE
Glucose, UA: >1000 mg/dL — AB
Hgb urine dipstick: NEGATIVE
Ketones, ur: 15 mg/dL — AB
Leukocytes, UA: NEGATIVE
Nitrite: NEGATIVE
Protein, ur: 30 mg/dL — AB
Specific Gravity, Urine: 1.025 (ref 1.005–1.030)
Urobilinogen, UA: 0.2 mg/dL (ref 0.0–1.0)
pH: 5.5 (ref 5.0–8.0)

## 2012-04-11 LAB — URINE MICROSCOPIC-ADD ON

## 2012-04-11 LAB — BASIC METABOLIC PANEL WITH GFR
BUN: 20 mg/dL (ref 6–23)
CO2: 25 meq/L (ref 19–32)
Calcium: 9.6 mg/dL (ref 8.4–10.5)
Chloride: 95 meq/L — ABNORMAL LOW (ref 96–112)
Creatinine, Ser: 1.13 mg/dL (ref 0.50–1.35)
GFR calc Af Amer: 88 mL/min — ABNORMAL LOW (ref >90)
GFR calc non Af Amer: 76 mL/min — ABNORMAL LOW (ref >90)
Glucose, Bld: 460 mg/dL — ABNORMAL HIGH (ref 70–99)
Potassium: 4.8 meq/L (ref 3.5–5.1)
Sodium: 131 meq/L — ABNORMAL LOW (ref 135–145)

## 2012-04-11 MED ORDER — INSULIN GLARGINE 100 UNIT/ML ~~LOC~~ SOLN
40.0000 [IU] | Freq: Once | SUBCUTANEOUS | Status: AC
  Administered 2012-04-11: 40 [IU] via SUBCUTANEOUS
  Filled 2012-04-11: qty 1

## 2012-04-11 MED ORDER — SODIUM CHLORIDE 0.9 % IV SOLN: 1000.0000 mL | Freq: Once | INTRAVENOUS | Status: DC

## 2012-04-11 MED ORDER — INSULIN ASPART 100 UNIT/ML ~~LOC~~ SOLN
15.0000 [IU] | Freq: Once | SUBCUTANEOUS | Status: AC
  Administered 2012-04-11: 15 [IU] via SUBCUTANEOUS
  Filled 2012-04-11: qty 1

## 2012-04-11 MED ORDER — INSULIN ASPART 100 UNIT/ML ~~LOC~~ SOLN
20.0000 [IU] | Freq: Once | SUBCUTANEOUS | Status: AC
  Administered 2012-04-11: 20 [IU] via SUBCUTANEOUS
  Filled 2012-04-11: qty 20
  Filled 2012-04-11: qty 1

## 2012-04-11 MED ORDER — INSULIN GLARGINE 100 UNIT/ML ~~LOC~~ SOLN: 40.0000 [IU] | Freq: Every day | SUBCUTANEOUS | Status: DC

## 2012-04-11 MED ORDER — SODIUM CHLORIDE 0.9 % IV SOLN
1000.0000 mL | INTRAVENOUS | Status: DC
  Administered 2012-04-11: 1000 mL via INTRAVENOUS

## 2012-04-11 MED ORDER — INSULIN ASPART 100 UNIT/ML ~~LOC~~ SOLN: 0.0000 [IU] | Freq: Three times a day (TID) | SUBCUTANEOUS | Status: DC

## 2012-04-11 NOTE — ED Notes (Signed)
Pt states has had pain in abdomen since 9pm last night.  Iv started and fluids hung.  Pt lying on stretcher resting at this time.

## 2012-04-11 NOTE — ED Provider Notes (Signed)
History     CSN: [id number]  Arrival date & time 04/11/12  [id number]   First MD Initiated Contact with Patient 04/11/12 [phone]      Chief Complaint  Patient presents with  . Dizziness  . Flank Pain    (Consider location/radiation/quality/duration/timing/severity/associated sxs/prior treatment) HPI 46 year old male presents to emergency room complaining of left flank pain, and high blood sugars. [facility], and reports he has nearly run out of his insulin. He's been trying to stretch his NovoLog. He has taken his last dose of Lantus as well. He reports generalized fatigue and lightheadedness especially when standing from sitting. No fevers no chills no urinary symptoms. He denies any nausea or vomiting. No prior history of kidney stones.  Past Medical History  Diagnosis Date  . Diabetes mellitus     Past Surgical History  Procedure Date  . Mandible fracture surgery   . Prostate surgery     Family History  Problem Relation Age of Onset  . Diabetes Mother     History  Substance Use Topics  . Smoking status: Never Smoker   . Smokeless tobacco: Not on file  . Alcohol Use: No      Review of Systems  All other systems reviewed and are negative.    Allergies  Review of patient's allergies indicates no known allergies.  Home Medications   Current Outpatient Rx  Name Route Sig Dispense Refill  . INSULIN ASPART 100 UNIT/ML [place] SOLN Subcutaneous Inject 0-12 Units into the skin 3 (three) times daily before meals. CBG < 80: Implement Hypoglycemia Protocol. CBG 70 - 120 (dose in units): 0 , CBG 121 - 150 (dose in units): 2, CBG 151 - 200 (dose in units): 4, CBG 201 - 250 (dose in units): 6, CBG 251 - 300 (dose in units): 8, CBG 301 - 350 (dose in units) 10, CBG 351 - 400 (dose in units): 12, CBG > 400 Call MD.    . INSULIN GLARGINE 100 UNIT/ML [place] SOLN Subcutaneous Inject 40 Units into the skin daily after breakfast.       BP 134/74  Temp 98.3 F (36.8  C) (Oral)  Resp 18  SpO2 100%  Physical Exam  Nursing note and vitals reviewed. Constitutional: He is oriented to person, place, and time. He appears well-developed and well-nourished.  HENT:  Head: Normocephalic and atraumatic.  Nose: Nose normal.  Mouth/Throat: Oropharynx is clear and moist.  Eyes: Conjunctivae normal and EOM are normal. Pupils are equal, round, and reactive to light.  Neck: Normal range of motion. Neck supple. No JVD present. No tracheal deviation present. No thyromegaly present.  Cardiovascular: Normal rate, regular rhythm, normal heart sounds and intact distal pulses.  Exam reveals no gallop and no friction rub.   No murmur heard. Pulmonary/Chest: Effort normal and breath sounds normal. No stridor. No respiratory distress. He has no wheezes. He has no rales. He exhibits no tenderness.  Abdominal: Soft. Bowel sounds are normal. He exhibits no distension and no mass. There is tenderness (Mild tenderness to left upper quadrant without rebound or guarding). There is no rebound and no guarding.  Musculoskeletal: Normal range of motion. He exhibits no edema and no tenderness.  Lymphadenopathy:    He has no cervical adenopathy.  Neurological: He is alert and oriented to person, place, and time. He exhibits normal muscle tone. Coordination normal.  Skin: Skin is warm and dry. No rash noted. No erythema. No pallor.  Psychiatric: He has a normal  mood and affect. His behavior is normal. Judgment and thought content normal.    ED Course  Procedures (including critical care time)  Labs Reviewed  GLUCOSE, CAPILLARY - Abnormal; Notable for the following:    Glucose-Capillary 445 (*)     All other components within normal limits  BASIC METABOLIC PANEL - Abnormal; Notable for the following:    Sodium 131 (*)     Chloride 95 (*)     Glucose, Bld 460 (*)     GFR calc non Af Amer 76 (*)     GFR calc Af Amer 88 (*)     All other components within normal limits  URINALYSIS,  ROUTINE W REFLEX MICROSCOPIC - Abnormal; Notable for the following:    Glucose, UA >1000 (*)     Ketones, ur 15 (*)     Protein, ur 30 (*)     All other components within normal limits  CBC WITH DIFFERENTIAL - Abnormal; Notable for the following:    HCT 38.3 (*)     MCV 72.4 (*)     MCH 24.6 (*)     All other components within normal limits  URINE MICROSCOPIC-ADD ON - Abnormal; Notable for the following:    Casts HYALINE CASTS (*)     All other components within normal limits  GLUCOSE, CAPILLARY - Abnormal; Notable for the following:    Glucose-Capillary 397 (*)     All other components within normal limits   No results found.   1. Diabetes   2. Hyperglycemia   3. Left flank pain       MDM  46 year old male with history of diabetes does run out of his [facility]. Looking back through the records, he often has left-sided flank/adult pain when his sugars are elevated. He overall looks well. His sugar is elevated at 445. We will restart him on his insulin, give him IV fluids for any dehydration. We'll give prescriptions for his diabetes medications.        Kalman Drape, MD 04/11/12 [phone]

## 2012-04-11 NOTE — ED Notes (Signed)
Pt is Type I diabetic to ED c/o feeling lightheaded since yesterday, esp when standing from sitting position.  Last night he began exp L sided flank pain.  Pt ran out of insulin yesterday.  Denies nausea, vomiting or changes in bowel/bladder habits.

## 2012-06-07 ENCOUNTER — Encounter: Payer: Self-pay | Admitting: Family Medicine

## 2012-06-07 ENCOUNTER — Ambulatory Visit (INDEPENDENT_AMBULATORY_CARE_PROVIDER_SITE_OTHER): Payer: Self-pay | Admitting: Family Medicine

## 2012-06-07 VITALS — BP 124/84 | HR 97 | Temp 98.7°F | Ht 71.5 in | Wt 157.8 lb

## 2012-06-07 DIAGNOSIS — E119 Type 2 diabetes mellitus without complications: Secondary | ICD-10-CM

## 2012-06-07 DIAGNOSIS — E1165 Type 2 diabetes mellitus with hyperglycemia: Secondary | ICD-10-CM

## 2012-06-07 DIAGNOSIS — R634 Abnormal weight loss: Secondary | ICD-10-CM

## 2012-06-07 DIAGNOSIS — I10 Essential (primary) hypertension: Secondary | ICD-10-CM

## 2012-06-07 DIAGNOSIS — IMO0001 Reserved for inherently not codable concepts without codable children: Secondary | ICD-10-CM

## 2012-06-07 MED ORDER — INSULIN NPH ISOPHANE & REGULAR (70-30) 100 UNIT/ML ~~LOC~~ SUSP: SUBCUTANEOUS | Status: DC

## 2012-06-07 MED ORDER — GLUCOSE BLOOD VI STRP: ORAL_STRIP | Status: DC

## 2012-06-07 NOTE — Patient Instructions (Addendum)
I want you to start taking the insulin as prescribed. 40 units of the 70/30 mix in the morning and 20 units before dinner. CHeck your blood sugar before you inject and make sure to keep a record. I am going to try to call you on Monday-Wednesday of next week to see if we can go up or down on your insulin and if we can use your novolog pens to assist. Follow up with me as soon as you get your orange card as there are some other things we need to arrange for you like your flu shot and pneumonia shot.

## 2012-06-08 ENCOUNTER — Encounter: Payer: Self-pay | Admitting: Family Medicine

## 2012-06-08 DIAGNOSIS — R634 Abnormal weight loss: Secondary | ICD-10-CM | POA: Insufficient documentation

## 2012-06-08 MED ORDER — ASPIRIN 81 MG PO TBEC: 81.0000 mg | DELAYED_RELEASE_TABLET | Freq: Every day | ORAL | Status: DC

## 2012-06-08 NOTE — Assessment & Plan Note (Addendum)
Poorly controlled Patient cannot afford novolog/lantus combo. Will trial NPH/regular 40 units in AM and 20 units in PM from [facility] where he can get 1000units/1 vial for $25 instead of $150. I will plan to call patient early next week to check on CBGs to try to titrate up further. May use aspart as adjuvant as he still has 6 pens. Also gave rx for strips-patient may do better with Relion long term given cost of strips $0.50.   Advised daily aspirin use (no history GI bleeds). Deferred pneumovax, flu, labs including lipids until he gets the orange card. Patient to follow up as soon as he gets the orange card or sooner as needed.   Patient does have severe neuropathic pain but as cost is concern, will focus on controlling DM then may consider gabapentin.

## 2012-06-08 NOTE — Assessment & Plan Note (Addendum)
Patient states has lost 50 lbs in last year in ROS. Patient reports having a normal colonoscopy near [address] states [place] or [place] but unclear which practice this is. TSH normal one year ago but will need repeat.  Patient states has been tested for HIV but do not see in records. Has had multiple CT scans of abdomen and CXR that do not show any obvious malignancy. Will follow when able to do more lab testing.
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
## 2012-06-12 ENCOUNTER — Telehealth: Payer: Self-pay | Admitting: Family Medicine

## 2012-06-12 NOTE — Telephone Encounter (Signed)
Noted. Jeremiah [name], Salome Spotted

## 2012-06-12 NOTE — Telephone Encounter (Signed)
Patient just got money to pick up 70/30 today. He has not picked up strips. He says overall he has been doing well and taking his Novolog only. Asked patient to give our nursing staff a call with following numbers with range and average in 1-2 weeks after getting insulin then I will call him back with adjustments.  AM Before Breakfast.  Before lunch Before Dinner

## 2012-11-07 ENCOUNTER — Telehealth: Payer: Self-pay | Admitting: Family Medicine

## 2012-11-07 NOTE — Telephone Encounter (Signed)
Jeremiah [name] has had swelling for a month to feet.  Called to see if someone could see him today because he's not feeling very well.  Have hx of poor circulation.  Call him at number for contact or at [phone]

## 2012-11-07 NOTE — Telephone Encounter (Signed)
Pt reports BLE swelling, increased shortness of breath, and dizziness. Theses symptoms have been ongoing for the past month. Recommended to keep feet elevated as much as possible. If symptoms become worse to seek care at ED. Appointment made for 230 on Thursday May 15.  Tildon Husky, RN-BSN

## 2012-11-08 ENCOUNTER — Ambulatory Visit (HOSPITAL_COMMUNITY)
Admission: RE | Admit: 2012-11-08 | Discharge: 2012-11-08 | Disposition: A | Discharge status: Home / Self Care | Payer: Self-pay | Source: Ambulatory Visit | Attending: Family Medicine | Admitting: Family Medicine

## 2012-11-08 ENCOUNTER — Encounter: Payer: Self-pay | Admitting: Family Medicine

## 2012-11-08 ENCOUNTER — Ambulatory Visit (INDEPENDENT_AMBULATORY_CARE_PROVIDER_SITE_OTHER): Payer: Self-pay | Admitting: Family Medicine

## 2012-11-08 VITALS — BP 142/73 | HR 92 | Temp 97.6°F | Ht 71.5 in | Wt 170.0 lb

## 2012-11-08 DIAGNOSIS — R9431 Abnormal electrocardiogram [ECG] [EKG]: Secondary | ICD-10-CM | POA: Insufficient documentation

## 2012-11-08 DIAGNOSIS — R079 Chest pain, unspecified: Secondary | ICD-10-CM | POA: Insufficient documentation

## 2012-11-08 DIAGNOSIS — E1165 Type 2 diabetes mellitus with hyperglycemia: Secondary | ICD-10-CM

## 2012-11-08 DIAGNOSIS — M25579 Pain in unspecified ankle and joints of unspecified foot: Secondary | ICD-10-CM | POA: Insufficient documentation

## 2012-11-08 DIAGNOSIS — IMO0001 Reserved for inherently not codable concepts without codable children: Secondary | ICD-10-CM

## 2012-11-08 DIAGNOSIS — R0602 Shortness of breath: Secondary | ICD-10-CM

## 2012-11-08 DIAGNOSIS — E119 Type 2 diabetes mellitus without complications: Secondary | ICD-10-CM

## 2012-11-08 DIAGNOSIS — R634 Abnormal weight loss: Secondary | ICD-10-CM

## 2012-11-08 LAB — CBC
HCT: 37.1 % — ABNORMAL LOW (ref 39.0–52.0)
Hemoglobin: 11.3 g/dL — ABNORMAL LOW (ref 13.0–17.0)
MCH: 23.1 pg — ABNORMAL LOW (ref 26.0–34.0)
MCHC: 30.5 g/dL (ref 30.0–36.0)
MCV: 75.7 fL — ABNORMAL LOW (ref 78.0–100.0)
Platelets: 265 10*3/uL (ref 150–400)
RBC: 4.9 MIL/uL (ref 4.22–5.81)
RDW: 14 % (ref 11.5–15.5)
WBC: 4.9 10*3/uL (ref 4.0–10.5)

## 2012-11-08 LAB — BASIC METABOLIC PANEL
BUN: 22 mg/dL (ref 6–23)
CO2: 27 mEq/L (ref 19–32)
Calcium: 8.8 mg/dL (ref 8.4–10.5)
Chloride: 97 mEq/L (ref 96–112)
Creat: 1.43 mg/dL — ABNORMAL HIGH (ref 0.50–1.35)
Glucose, Bld: 603 mg/dL (ref 70–99)
Potassium: 5.4 mEq/L — ABNORMAL HIGH (ref 3.5–5.3)
Sodium: 129 mEq/L — ABNORMAL LOW (ref 135–145)

## 2012-11-08 LAB — POCT GLYCOSYLATED HEMOGLOBIN (HGB A1C): Hemoglobin A1C: >14

## 2012-11-08 MED ORDER — INSULIN NPH ISOPHANE & REGULAR (70-30) 100 UNIT/ML ~~LOC~~ SUSP: SUBCUTANEOUS | Status: DC

## 2012-11-08 NOTE — Assessment & Plan Note (Addendum)
Possibly anxiety-related, no exam findings of DVT/PE, pulmonary infective process or asthma.  - CBC and TSH today - Chest x-ray to evaluate for malignancy or other abnormality with h/o weight loss - EKG WNL - F/u with Dr. Yong Channel 1 week - Return precautions reviewed - see AVS

## 2012-11-08 NOTE — Patient Instructions (Addendum)
Mr Swalve,  It was good to meet you today. I am sorry you have felt unwell for some time.  For your leg discomfort, I think this is most likely neuropathy from your diabetes. - Raise your legs, massage, rest them occasionally, try walking when they do not feel well, and try compression stockings if they feel swollen. - We are checking labs and I will call you if anything is NOT normal (blood count, metabolic panel). - There were no signs of infection or clot in your legs today. - You can try a daily multivitamin to see if this helps.  For your shortness of breath,  - We are doing labs today (blood count, thyroid) - We are doing a chest xray today.  - Your EKG was normal. - Follow up with Dr. Yong Channel in 1 week.  For your diabetes, your A1c today is still very high. Good control is very important to keeping you alive and well. - I have re-prescribed your insulin. - Follow-up with Dr. Yong Channel in 1 week to re-discuss this. - I am putting the paperwork you gave me in Dr. Ansel Bong box but he may only get to this next week.  For your weight loss,  - We are doing labs today (thyroid, HIV) - Follow up with Dr. Yong Channel in 1 week - Please have records of any colonoscopy you may have done sent to Dr. Yong Channel (fax number [phone]).  If you develop worsening shortness of breath, chest pains, fevers, worse leg swelling,leg asymmetry, passing out, or other concerning symptoms, please seek immediate medical care.  Dr. Dianah Field [phone] - office number

## 2012-11-08 NOTE — Assessment & Plan Note (Addendum)
Per patient, Type I diabetes. A1c today >14.  - Emphasized importance of med compliance and better control. - Likely diabetic neuropathy causing leg pain and numbness and finger numbness - Re-prescribed insulin as pt was out of NPH-regular 70-30 - Paperwork for Medical Statement placed in Dr. Ansel Bong box, pt aware he will only get to it next week. - F/u 1 week with Dr. Yong Channel - Referred to health coach

## 2012-11-08 NOTE — Progress Notes (Signed)
Subjective:     Patient ID: Jeremiah [name], male   DOB: Aug 31, 1965, 47 y.o.   MRN: [id number]  CC - swollen feet, sob, and dizziness x >1 month  HPI - Jeremiah [name] is a 47 y.o. male with h/o poorly-controlled Type I DM on insulin, HTN, abdominal pain and weight loss here with swollen painful feet, shortness of breath, and dizziness x > 1 month.   1. Painful feet: He states pain is 10/10, wakes him from sleep, is constant and sharp at anterior ankle and lateral lower leg, and makes it hard to walk. Swelling is symmetric with no erythema. Pain limits walking. Digits 4 and 5 bilaterally also have felt numb to wrist x 3-4 months. Massaging helps LE pain/welling, but has not tried anything else. Sleeping/nighttime makes it worse. Feet/ankles numb/tingling. Denies weakness.  2. Dyspnea: He has noticed dyspnea and dizziness the same amount of time, worse when he is active and takes out the trash. Denies pain with deep inspiration, cough, sneezing, or URI symptoms, fevers, chills, weakness, chest pain, palpitations, nausea, vomiting, hearing changes, previous similar episode, sick contact, difficulty speaking or gait abnormalities, or headaches but reports pressure behind eyes.  3. Diabetes mellitus - Reports intermittently running out of insulin and not taking it. BGs at home range from 300-400.  4. Weight loss - Pt reports  decrease in weight 70 lbs over the past year. He thinks he has had a colonoscopy and that it was normal other than large stool burden. This is not in EPIC.  Review of Systems - Per HPI, with all other systems reviewed and negative.  Past Medical History  Diagnosis Date  . Poorly controlled type 2 diabetes mellitus   . OA (osteoarthritis)   . Chronic pain   . Hypertension   . Diabetic neuropathy    SH:  Denies tobacco use     Objective:   Physical Exam BP 142/73  Pulse 92  Temp(Src) 97.6 F (36.4 C) (Oral)  Ht 5' 11.5" (1.816 m)  Wt 170 lb (77.111 kg)  BMI  23.38 kg/m2  SpO2 100% NAD, pleasant HEENT: unable to move mouth fully, MMM, sclera clear, EOMI PULM: No crackles or wheezes, CTAB, normal effort CV: RRR no m/r/g; no JVD ABD: soft, nonobese, nontender, nondistended, no organomegaly EXTR: Full ROM all joints, tenderness ankles and anterior leg, no calf tenderness, no cord, no swelling, no effusion, 2+ bilateral DP pulses, some scaling between toes Diabetic foot exam performed with bilateral decreased sensation entire bottom of foot  EKG: Normal sinus rhythm with nonspecific p-wave abnormality    Assessment:     Jeremiah [name] is a 47 y.o. male with h/o poorly-controlled Type I DM on insulin, HTN, abdominal pain and weight loss here with swollen painful feet, shortness of breath, and dizziness x > 1 month.     Plan:     # See problem-specific assessment and plan.

## 2012-11-08 NOTE — Assessment & Plan Note (Signed)
Most likely diabetic neuropathy with longstanding uncontrolled DM, also consider restless leg. No signs of LE DVT or effusion or infection.  - Reassure, recommended elevate, massage, exercise, compression stockings - Check BMET, CBC - Can try daily MVI which would help RLS

## 2012-11-08 NOTE — Assessment & Plan Note (Signed)
Ddx includes HIV, malignancy, but with most likely cause being long-standing poorly controlled DM. - TSH and HIV today - Chest x-ray - Asked patient to have previous colonoscopy results faxed to [place] - F/u with Dr. Yong Channel in 1 week; if all negative, consider further malignancy workup - Consider TB skin test if never done, though no h/o cough, bloody sputum, or night sweats

## 2012-11-09 ENCOUNTER — Telehealth: Payer: Self-pay | Admitting: *Deleted

## 2012-11-09 ENCOUNTER — Ambulatory Visit (HOSPITAL_COMMUNITY)
Admission: RE | Admit: 2012-11-09 | Discharge: 2012-11-09 | Disposition: A | Discharge status: Home / Self Care | Payer: Self-pay | Source: Ambulatory Visit | Attending: Family Medicine | Admitting: Family Medicine

## 2012-11-09 ENCOUNTER — Telehealth: Payer: Self-pay | Admitting: Family Medicine

## 2012-11-09 DIAGNOSIS — R0602 Shortness of breath: Secondary | ICD-10-CM | POA: Insufficient documentation

## 2012-11-09 DIAGNOSIS — IMO0001 Reserved for inherently not codable concepts without codable children: Secondary | ICD-10-CM

## 2012-11-09 LAB — TSH: TSH: 1.474 u[IU]/mL (ref 0.350–4.500)

## 2012-11-09 LAB — HIV ANTIBODY (ROUTINE TESTING W REFLEX): HIV: NONREACTIVE

## 2012-11-09 MED ORDER — GLUCOSE BLOOD VI STRP: ORAL_STRIP | Status: DC

## 2012-11-09 MED ORDER — ONETOUCH ULTRASOFT LANCETS MISC: Status: DC

## 2012-11-09 NOTE — Telephone Encounter (Signed)
Made aware of critical lab value.  (Glucose 603) No Gap, Cr 1.43, and Na corrected is nml.  Spoke to Resident who saw pt in clinic. Pt stable at time of lab draw yesterday in clinic and w/o home insulin Pt to pick up insulin after clinic appt yesterday Resident in clinic today and will work w/ office staff to ensure pt glucose and overall condition is stable otherwise pt to come into clinic/ED for further care.  Linna Darner, MD Family Medicine PGY-2 11/09/2012, 8:08 AM

## 2012-11-09 NOTE — Telephone Encounter (Addendum)
[name] called and I was able to speak with him. He states he has taken novolog and he has taken insulin 70/30 in the past, but within the last month he has not taken them at the same time and is not certain to give me more details. He came to clinic and picked up sample medication today of novolog, and plans to pick up 70/30 as well. He states he feels well currently, denying worsened symptoms, abdominal pain, nausea/vomiting, fevers, dizziness, or syncope. - I recommended he take novolog for now per sliding scale listed in his orders (reviewed with him).  - Re-ordered test strips and lancets (Has [employer] meter and no insurance - unable to tell what supplies go with that glucometer). - He was unsure if he would be able to afford test supplies. I told him if unable to check blood sugar, at least take 8units novolog before meals. - If he feels sick, nauseous, or has other concerning symptoms, I would be concerned for DKA and he should go to Emergency Room. - I encouraged him to call Monday to make a Monday appointment to be seen and have BG checked and regimen and blood sugars at home reviewed. - Consider adding 70/30 back soon if BGs still elevated with novolog (which they likely will be), but given sporadic med compliance with insulins, do not want to start both back now and cause hypoglycemia. - K mildly elevated, and pt denying any chest pain. - Cr mildly increased - f/u when returns  - Hgb decrease - anemia could be causing some of leg pain symptoms and shortness of breath. Consider workup and starting iron with low MCV, at f/u.  Conni Slipper 11/09/2012  5:44 PM

## 2012-11-09 NOTE — Telephone Encounter (Signed)
Called patient but got voicemail and it is full so could not leave message. Looked in chart for emergency contact but same number is listed. Will try again later. Need to confirm if patient got insulin that I prescribed and if he could check BG at home. If not, he needs to come to clinic to receive sample if we have it.  Conni Slipper  11/09/2012 8:37 AM

## 2012-11-09 NOTE — Telephone Encounter (Signed)
Called patient again at 1:32 pm and rang but went to voicemail which is full. Unsure what else to do at this point to get in touch with pt. Will route to support staff blue hall. Please help by trying to contact patient. Emergency contact is same number and no other number listed.  ADMIN: Do you know another number I can reach this patient at?

## 2012-11-09 NOTE — Telephone Encounter (Signed)
Called patient at 4:13pm.  Received same message that voicemail is full.  Lauralyn Primes

## 2012-11-09 NOTE — Telephone Encounter (Signed)
Called patient at 10:33am.  Got the same voicemail as mentioned below.  Will try again at a later time.  Jeremiah [name]

## 2012-11-09 NOTE — Telephone Encounter (Signed)
Spoke with patient when he called office - see my phone note from today.

## 2012-11-09 NOTE — Telephone Encounter (Signed)
BLUE TEAM: Please call patient this morning to check that he has obtained insulin and started taking, and ask him to check bg and call us with value. If he has not obtained insulin, please see if we have samples in office and ask him to come into clinic today to receive insulin sample.  Conni Slipper 11/09/2012 8:20 AM

## 2012-11-09 NOTE — Telephone Encounter (Signed)
Pt has questions about his insulin---does it react imeediately or does it take awhile?

## 2012-11-09 NOTE — Telephone Encounter (Signed)
Medication Samples have been provided to the patient.  Drug name: Novolog  Qty: 1  LOT: [id number]       [id number]  Exp.Date: 06/2013  The patient has been instructed regarding the correct time, dose, and frequency of taking this medication, including desired effects and most common side effects. Pt verbalized understanding.  Sandre Kitty 2:49 PM 11/09/2012

## 2012-11-16 ENCOUNTER — Encounter: Payer: Self-pay | Admitting: Family Medicine

## 2012-11-16 ENCOUNTER — Ambulatory Visit (INDEPENDENT_AMBULATORY_CARE_PROVIDER_SITE_OTHER): Payer: Self-pay | Admitting: Family Medicine

## 2012-11-16 VITALS — BP 145/72 | HR 73 | Temp 97.8°F | Ht 71.5 in | Wt 172.1 lb

## 2012-11-16 DIAGNOSIS — M7989 Other specified soft tissue disorders: Secondary | ICD-10-CM

## 2012-11-16 DIAGNOSIS — E1165 Type 2 diabetes mellitus with hyperglycemia: Secondary | ICD-10-CM

## 2012-11-16 DIAGNOSIS — R0602 Shortness of breath: Secondary | ICD-10-CM

## 2012-11-16 DIAGNOSIS — E119 Type 2 diabetes mellitus without complications: Secondary | ICD-10-CM

## 2012-11-16 DIAGNOSIS — IMO0001 Reserved for inherently not codable concepts without codable children: Secondary | ICD-10-CM

## 2012-11-16 NOTE — Patient Instructions (Addendum)
Jeremiah [name],  [place]. For your diabetes  *pick up all your supplies including insulin  *take 25 units of 70/30 in AM and 15 units in PM before dinner. Make sure to eat 3 meals.    *DONT USE SLIDING SCALE NOVOLOG while taking 70/30!    *let us know or dont take insulin if your blood sugar is less than 100.   *CHeck your blood sugar before you inject and make sure to keep a record.  *See me in 1 week  *we will talk about your neuropathy at that time.   *remember, I need you to do all the above steps before we can discuss your disability paperwork.   2. Your legs are likely swelling due to the heat outside. The multivitamin that Dr. Darene Lamer advised is probably reasonable.  3. I think an exercise program is a good idea, start with 5 minutes of walking per day and go up to 10 if you can handle it. If you make it to 10, go up to 15 with a goal of 30 eventually. Stop for chest pain or shortness of breath and let us know.  4. Please have records of any colonoscopy you may have done sent to Dr. Yong Channel (fax number [phone]).  If you develop worsening shortness of breath, chest pains, fevers, worse leg swelling,leg asymmetry, passing out, or other concerning symptoms, please seek immediate medical care. You need to apply for the orange card.   Thanks, Dr. Yong Channel  SEE [place] 1 WEEK!   Hypoglycemia (Low Blood Sugar) Hypoglycemia is when the glucose (sugar) in your blood is too low. Hypoglycemia can happen for many reasons. It can happen to people with or without diabetes. Hypoglycemia can develop quickly and can be a medical emergency.  CAUSES  Having hypoglycemia does not mean that you will develop diabetes. Different causes include:  Missed or delayed meals or not enough carbohydrates eaten.  Medication overdose. This could be by accident or deliberate. If by accident, your medication may need to be adjusted or changed.  Exercise or increased activity without adjustments in carbohydrates or medications.  A  nerve disorder that affects body functions like your heart rate, blood pressure and digestion (autonomic neuropathy).  A condition where the stomach muscles do not function properly (gastroparesis). Therefore, medications may not absorb properly.  The inability to recognize the signs of hypoglycemia (hypoglycemic unawareness).  Absorption of insulin  may be altered.  Alcohol consumption.  Pregnancy/menstrual cycles/postpartum. This may be due to hormones.  Certain kinds of tumors. This is very rare. SYMPTOMS   Sweating.  Hunger.  Dizziness.  Blurred vision.  Drowsiness.  Weakness.  Headache.  Rapid heart beat.  Shakiness.  Nervousness. DIAGNOSIS  Diagnosis is made by monitoring blood glucose in one or all of the following ways:  Fingerstick blood glucose monitoring.  Laboratory results. TREATMENT  If you think your blood glucose is low:  Check your blood glucose, if possible. If it is less than 70 mg/dl, take one of the following:  3-4 glucose tablets.   cup juice (prefer clear like apple).   cup "regular" soda pop.  1 cup milk.  -1 tube of glucose gel.  5-6 hard candies.  Do not over treat because your blood glucose (sugar) will only go too high.  Wait 15 minutes and recheck your blood glucose. If it is still less than 70 mg/dl (or below your target range), repeat treatment.  Eat a snack if it is more than one hour until  your next meal. Sometimes, your blood glucose may go so low that you are unable to treat yourself. You may need someone to help you. You may even pass out or be unable to swallow. This may require you to get an injection of glucagon, which raises the blood glucose. HOME CARE INSTRUCTIONS  Check blood glucose as recommended by your caregiver.  Take medication as prescribed by your caregiver.  Follow your meal plan. Do not skip meals. Eat on time.  If you are going to drink alcohol, drink it only with meals.  Check your blood  glucose before driving.  Check your blood glucose before and after exercise. If you exercise longer or different than usual, be sure to check blood glucose more frequently.  Always carry treatment with you. Glucose tablets are the easiest to carry.  Always wear medical alert jewelry or carry some form of identification that states that you have diabetes. This will alert people that you have diabetes. If you have hypoglycemia, they will have a better idea on what to do. SEEK MEDICAL CARE IF:   You are having problems keeping your blood sugar at target range.  You are having frequent episodes of hypoglycemia.  You feel you might be having side effects from your medicines.  You have symptoms of an illness that is not improving after 3-4 days.  You notice a change in vision or a new problem with your vision. SEEK IMMEDIATE MEDICAL CARE IF:   You are a family member or friend of a person whose blood glucose goes below 70 mg/dl and is accompanied by:  Confusion.  A change in mental status.  The inability to swallow.  Passing out. Document Released: 06/13/2005 Document Revised: 09/05/2011 Document Reviewed: 10/10/2011 [facility] Patient Information 2014 [place], [place].

## 2012-11-18 NOTE — Assessment & Plan Note (Signed)
reimphasized need for diabetic supplies and picking up 70/30. Advised no novolog and for patient to start 25 units 70/30 in AM and 15 units in PM. Follow up in 1 week. Discussed with patient his lifespan will likely be short if we continue on current path without appropriately treating him.   Will discuss DM neuropathy and DM next week.

## 2012-11-18 NOTE — Assessment & Plan Note (Signed)
Suspect swelling due to vasodilation from increased temperatures lately and related to when patient stands for some period. No JVD or hepatojugular reflux or symptoms to suggest CHF and CXR did not show pulmonary edema additionally.   Concerning dyspnea, I believe this may be related to deconditioning. CAD/stable angina could be a possibility but I have a low suspcion given no other symptoms and patient's inactivity likely contirbuting to deconditioning. I think it is safe to try a short exercise regimen (5-10 minutes) of walking first and then advance as long as SOB is not progressive or doesn't have chest pain (though would be less likely to have chest pain given poorly controlled DM)

## 2012-11-18 NOTE — Assessment & Plan Note (Deleted)
reimphasized need for diabetic supplies and picking up 70/30. Advised no novolog and for patient to start 25 units 70/30 in AM and 15 units in PM. Follow up in 1 week. Discussed with patient his lifespan will likely be short if we continue on current path without appropriately treating him.   Will discuss DM neuropathy and DM next week.

## 2012-11-18 NOTE — Progress Notes (Signed)
Subjective:   # Diabetes Mellitus, poorly controlled This is 3rd visit with patient at our office. Despite each visit and recommendations to start 70/30 regimen, patient has not done so yet. Previously reported he had run out but patient details to me never picking up supplies or 70/30.  He is taking 8 units of novolog before each meal (samples he picked up from clinic) Unable to determine barrier to obtaining insulin other than cost but patient states he can afford the medication and his supplies (also has not checked supplies so cannot check CBGs). Has polyuria and polydipsia. Painful neuropathy has been a long term issues and associated with numbness and tingling. NO vision changes. Denies current abdominal pain  #Swelling in legs/Dyspnea Patient complains of swelling when on his feet for a while over the last month. ALso complains of some dyspnea on exertion (lifting heavy things, taking trash out if walks quickly). Pain in both legs likely from neuropathy. No weakness in legs. No cough, fever, chills, nausea, vomiting. Seen 10 days ago and following workup unremarkable-cbc, tsh, CXR, bmet, ekg  Denies orthopnea, PND, SOB at rest, SOB with walking without carrying something, chest pain-other pain-nausea/vomiting-sweatiness accompanying SOB, history of CAD. Does endorse that over the winter he was pretty inactive.  Endorses lightheadedness if stands very very quickly but typically not an issue.   ROS--See HPI  Past Medical History Patient Active Problem List   Diagnosis Date Noted  . Pain in joint, ankle and foot 11/08/2012  . Shortness of breath 11/08/2012  . Weight loss 06/08/2012  . Poorly controlled type 2 diabetes mellitus   . Hypertension   . Hyperosmolarity due to secondary diabetes 05/06/2011  . Type II or unspecified type diabetes mellitus without mention of complication, uncontrolled 05/06/2011  . Abdominal  pain, other specified site 05/06/2011   Reviewed problem list.   Medications- reviewed and updated Chief complaint-noted  Objective: BP 145/72  Pulse 73  Temp(Src) 97.8 F (36.6 C) (Oral)  Ht 5' 11.5" (1.816 m)  Wt 172 lb 1.6 oz (78.064 kg)  BMI 23.67 kg/m2 Gen: NAD, resting comfortably on table CV: RRR no murmurs rubs or gallops, no JVD or hepatojugular reflux Lungs: CTAB no crackles, wheeze, rhonchi Skin: warm, dry Neuro: grossly normal, moves all extremities Ext: no edema, no cords or calf tenderness  Assessment/Plan: Discussed will not fill out disability paper work until patient picks up DM supplies and insulin and discussed reason for this is that I am concerned about patient's current trajectory without starting insulin.

## 2012-11-23 ENCOUNTER — Encounter: Payer: Self-pay | Admitting: Family Medicine

## 2012-11-23 ENCOUNTER — Ambulatory Visit (INDEPENDENT_AMBULATORY_CARE_PROVIDER_SITE_OTHER): Payer: Self-pay | Admitting: Family Medicine

## 2012-11-23 VITALS — BP 141/73 | HR 92 | Temp 98.7°F | Ht 71.5 in | Wt 178.0 lb

## 2012-11-23 DIAGNOSIS — E119 Type 2 diabetes mellitus without complications: Secondary | ICD-10-CM

## 2012-11-23 DIAGNOSIS — E1165 Type 2 diabetes mellitus with hyperglycemia: Secondary | ICD-10-CM

## 2012-11-23 DIAGNOSIS — E162 Hypoglycemia, unspecified: Secondary | ICD-10-CM

## 2012-11-23 DIAGNOSIS — I10 Essential (primary) hypertension: Secondary | ICD-10-CM

## 2012-11-23 NOTE — Progress Notes (Signed)
Subjective:   # Diabetes Mellitus, poorly controlled On Monday, patient picked up insulin as well as diabetic testing supplies. Patient began taking 25 units of Relion 70/30 in AM and 15 units in PM on Tuesday morning. Patient was told explicitly not to take novolog alone when taking 70/30 but he continued to take 4 units at each meal.   CBGs per patient  *350-530 in AM  *avg 400 before dinner  Health Maintenance Due  Topic Date Due  . Pneumococcal Polysaccharide Vaccine (#1)-planned for next visit 06/19/1967  . Foot Exam -planned for next visit 06/19/1975  . Ophthalmology Exam - retinal exam in office unsuccessful (3rd attempt). Will need optho visit once gets orange card 06/19/1975  . Urine Microalbumin -next visit vs. Start ace-i. Deferred today in order to focus on efforts to avoid hypoglycemia 06/19/1975  . Tetanus/tdap -next visit 06/18/1984   #Hypoglycemia On Wednesday, patient states he took 8 units of novolog around 1pm after seeing a CBG of 500 after taking 25 of relion 70/30 at breakfast time. Patient states he left after the novolog and did not eat lunch and was busy all afternoon before realizing in late afternoon that he was starting to get sweaty. Apparently patient was found kind of leaning up against a post on roadside and someone called an ambulance. He was given D50 apparently and was stabilized. He refused transport to hospital. States later that night CBGs back up to 400.   #Hypertension BP Readings from Last 3 Encounters:  11/23/12 141/73  11/16/12 145/72  11/08/12 142/73  Home BP monitoring-no Compliant with medications-no current medications Denies any CP, HA, blurry vision, LE edema, transient weakness, orthopnea, PND. Has been walking 5 minutes per day without dyspnea.   ROS--See HPI  Past Medical History Patient Active Problem List   Diagnosis Date Noted  . Poorly controlled type 2 diabetes mellitus     Priority: High  . Hypertension     Priority: Medium   . Pain in joint, ankle and foot 11/08/2012  . Shortness of breath 11/08/2012  . Weight loss 06/08/2012   Reviewed problem list.  Medications- reviewed and updated Chief complaint-noted  Objective: BP 141/73  Pulse 92  Temp(Src) 98.7 F (37.1 C) (Oral)  Ht 5' 11.5" (1.816 m)  Wt 178 lb (80.74 kg)  BMI 24.48 kg/m2 Gen: NAD, resting comfortably on table Mucus membranes-slightly dry Skin: warm, dry Neuro: grossly normal, moves all extremities Ext: no edema, no cords or calf tenderness  Assessment/Plan: Deferred disability paperwork and diabetic neuropathy until next visit  18 of visit spent focusing on appropriate use of medications, eating 3 meals, taking meds as prescribed, avoiding hypoglycemia, signs of hypoglycemia.

## 2012-11-23 NOTE — Assessment & Plan Note (Addendum)
Increased relion 70/30 to 30 units in AM and 20 units in PM. I recognize this is less than 52 units per day patient already taking but I want to stabilize him on one regimen and recognize this may take repeat visits. Emphasized hypoglycemia symptoms and requested patient to call me if he has these symptoms again. Emphasized NO additional insulin to 70/30. Follow up in 1 week.

## 2012-11-23 NOTE — Patient Instructions (Addendum)
[name],  [place]. For your diabetes  *take 30 units of 70/30 in AM before breakfast and 20 units in PM before dinner. Make sure to eat 3 meals.    *NO MORE NOVOLOG AT ALL!!!!!   *let us know or dont take insulin if your blood sugar is less than 100.   *CHeck your blood sugar before you inject and make sure to keep a record. Bring an actual book with you where you wrote it down.   *See me in 1 week  *we will talk about your neuropathy at that time.   SEE ME IN 1 WEEK!  Thanks, Dr. Yong Channel  You need to apply for the orange card. Try again next week.   Hypoglycemia (Low Blood Sugar) Hypoglycemia is when the glucose (sugar) in your blood is too low. Hypoglycemia can happen for many reasons. It can happen to people with or without diabetes. Hypoglycemia can develop quickly and can be a medical emergency.  CAUSES  Having hypoglycemia does not mean that you will develop diabetes. Different causes include:  Missed or delayed meals or not enough carbohydrates eaten.  Medication overdose. This could be by accident or deliberate. If by accident, your medication may need to be adjusted or changed.  Exercise or increased activity without adjustments in carbohydrates or medications.  A nerve disorder that affects body functions like your heart rate, blood pressure and digestion (autonomic neuropathy).  A condition where the stomach muscles do not function properly (gastroparesis). Therefore, medications may not absorb properly.  The inability to recognize the signs of hypoglycemia (hypoglycemic unawareness).  Absorption of insulin  may be altered.  Alcohol consumption.  Pregnancy/menstrual cycles/postpartum. This may be due to hormones.  Certain kinds of tumors. This is very rare. SYMPTOMS   Sweating.  Hunger.  Dizziness.  Blurred vision.  Drowsiness.  Weakness.  Headache.  Rapid heart beat.  Shakiness.  Nervousness. DIAGNOSIS  Diagnosis is made by monitoring blood glucose  in one or all of the following ways:  Fingerstick blood glucose monitoring.  Laboratory results. TREATMENT  If you think your blood glucose is low:  Check your blood glucose, if possible. If it is less than 70 mg/dl, take one of the following:  3-4 glucose tablets.   cup juice (prefer clear like apple).   cup "regular" soda pop.  1 cup milk.  -1 tube of glucose gel.  5-6 hard candies.  Do not over treat because your blood glucose (sugar) will only go too high.  Wait 15 minutes and recheck your blood glucose. If it is still less than 70 mg/dl (or below your target range), repeat treatment.  Eat a snack if it is more than one hour until your next meal. Sometimes, your blood glucose may go so low that you are unable to treat yourself. You may need someone to help you. You may even pass out or be unable to swallow. This may require you to get an injection of glucagon, which raises the blood glucose. HOME CARE INSTRUCTIONS  Check blood glucose as recommended by your caregiver.  Take medication as prescribed by your caregiver.  Follow your meal plan. Do not skip meals. Eat on time.  If you are going to drink alcohol, drink it only with meals.  Check your blood glucose before driving.  Check your blood glucose before and after exercise. If you exercise longer or different than usual, be sure to check blood glucose more frequently.  Always carry treatment with you. Glucose tablets are  the easiest to carry.  Always wear medical alert jewelry or carry some form of identification that states that you have diabetes. This will alert people that you have diabetes. If you have hypoglycemia, they will have a better idea on what to do. SEEK MEDICAL CARE IF:   You are having problems keeping your blood sugar at target range.  You are having frequent episodes of hypoglycemia.  You feel you might be having side effects from your medicines.  You have symptoms of an illness that is  not improving after 3-4 days.  You notice a change in vision or a new problem with your vision. SEEK IMMEDIATE MEDICAL CARE IF:   You are a family member or friend of a person whose blood glucose goes below 70 mg/dl and is accompanied by:  Confusion.  A change in mental status.  The inability to swallow.  Passing out. Document Released: 06/13/2005 Document Revised: 09/05/2011 Document Reviewed: 10/10/2011 [facility] Patient Information 2014 [place], [place].

## 2012-11-23 NOTE — Assessment & Plan Note (Signed)
Elevated x 3 visits now. Considered starting ace-i but given creatinine 2 visits ago elevated to 1.43 I did not make this adjustment. Will check creatinine again at next visit in hopes that improving diabetic control will improve creatinine. Will start ace-i if able. Could also consider ccb while awaiting improved CBG control.

## 2012-12-03 ENCOUNTER — Ambulatory Visit (INDEPENDENT_AMBULATORY_CARE_PROVIDER_SITE_OTHER): Payer: Self-pay | Admitting: Family Medicine

## 2012-12-03 VITALS — BP 148/70 | HR 96 | Temp 97.8°F | Ht 71.5 in | Wt 183.0 lb

## 2012-12-03 DIAGNOSIS — E1165 Type 2 diabetes mellitus with hyperglycemia: Secondary | ICD-10-CM

## 2012-12-03 DIAGNOSIS — I10 Essential (primary) hypertension: Secondary | ICD-10-CM

## 2012-12-03 DIAGNOSIS — E1149 Type 2 diabetes mellitus with other diabetic neurological complication: Secondary | ICD-10-CM

## 2012-12-03 DIAGNOSIS — E119 Type 2 diabetes mellitus without complications: Secondary | ICD-10-CM

## 2012-12-03 DIAGNOSIS — E1142 Type 2 diabetes mellitus with diabetic polyneuropathy: Secondary | ICD-10-CM

## 2012-12-03 MED ORDER — LISINOPRIL 10 MG PO TABS: 10.0000 mg | ORAL_TABLET | Freq: Every day | ORAL | Status: DC

## 2012-12-03 MED ORDER — GABAPENTIN 300 MG PO CAPS: 300.0000 mg | ORAL_CAPSULE | Freq: Every day | ORAL | Status: DC

## 2012-12-03 NOTE — Progress Notes (Signed)
  Subjective:    Patient ID: Jeremiah [name], male    DOB: 07/26/1965, 47 y.o.   MRN: [id number]  HPI:  Jeremiah [name] comes in for follow up of his out of control DM. He is taking 70/30, 20-25 units in the AM and 15 units in the PM.  His fasting sugars have been around 350's, PM sugars a little higher, but he says this is an improvement from before.  He has been checking at least twice a day but has not kept a log book.   HTN: not on medications, denies chest pain, dyspnea, LE edema, palpitations.   Foot pain: complains of foot pain that is sharp/burning, wakes him at night time.  OTC medications do not help.  Has numbness in feet too.  No injury.   Past Medical History  Diagnosis Date  . Poorly controlled type 2 diabetes mellitus   . OA (osteoarthritis)   . Chronic pain   . Hypertension   . Diabetic neuropathy     History  Substance Use Topics  . Smoking status: Never Smoker   . Smokeless tobacco: Never Used  . Alcohol Use: No    Family History  Problem Relation Age of Onset  . Diabetes Mother     poorly controlled-amputation  . Hyperlipidemia Mother   . Hypertension Mother   . Kidney disease Mother     dialysis. 76 died.   . Stroke Mother     cause of death.   . Heart disease Father     CABG age 60      ROS Pertinent items in HPI    Objective:  Physical Exam:  BP 148/70  Pulse 96  Temp(Src) 97.8 F (36.6 C) (Oral)  Ht 5' 11.5" (1.816 m)  Wt 183 lb (83.008 kg)  BMI 25.17 kg/m2 General appearance: alert, cooperative and no distress Head: Normocephalic, without obvious abnormality, atraumatic Lungs: clear to auscultation bilaterally Heart: regular rate and rhythm, S1, S2 normal, no murmur, click, rub or gallop Pulses: 2+ and symmetric Feet: no ulcers or cuts.  Decreased sensation to soft touch and pin prick on balls of feet and heels, as well as in between big toes bilaterally.        Assessment & Plan:

## 2012-12-03 NOTE — Assessment & Plan Note (Signed)
Will start neurontin QHS.  I did tell him some of this may be reversible with improved glucose control, but likely not all of it.

## 2012-12-03 NOTE — Assessment & Plan Note (Signed)
Sugars elevated, will increase 70/30 slowly.  Gave blood sugar diary book so he can keep log of doses of insulin and blood sugars.  F/u with pcp in 2 weeks.

## 2012-12-03 NOTE — Patient Instructions (Signed)
1) Diabetes: Please take 30 units in the morning and 20 in the evening.  Please keep a log of your blood sugars in the booklet  2) High Blood pressure: Please start taking lisinopril 10 mg once a day  3) Feet pain: Please try gabapentin, one pill at bedtime.  This pain may improve when your blood sugar is under better control.   Please make an appointment to see Dr. Yong Channel in 2 weeks, bring your blood sugar booklet with you.

## 2012-12-03 NOTE — Assessment & Plan Note (Signed)
Will start low dose lisinopril given last BMET with elevated creatinine.  Re-check creatinine.

## 2012-12-05 ENCOUNTER — Ambulatory Visit: Payer: Self-pay | Admitting: Family Medicine

## 2012-12-15 ENCOUNTER — Encounter: Payer: Self-pay | Admitting: Family Medicine

## 2012-12-15 ENCOUNTER — Telehealth: Payer: Self-pay | Admitting: Family Medicine

## 2012-12-15 DIAGNOSIS — E11311 Type 2 diabetes mellitus with unspecified diabetic retinopathy with macular edema: Secondary | ICD-10-CM | POA: Insufficient documentation

## 2012-12-15 NOTE — Telephone Encounter (Signed)
Attempted to reach patient about diabetic retinopathy but mail box is full. Has appointment 6/23 and hope to discuss at that time. Will need to find resources for patient as has no insurance. Not sure if he has signed up for orange card yet.   Addendum: patient called back and informed. Does not have orange card. Has not been able to reach orange card representative yet. Encouraged patient strongly to follow up with this so we could get him into see an opthalmologist.

## 2012-12-17 ENCOUNTER — Ambulatory Visit (INDEPENDENT_AMBULATORY_CARE_PROVIDER_SITE_OTHER): Payer: Self-pay | Admitting: Family Medicine

## 2012-12-17 ENCOUNTER — Encounter: Payer: Self-pay | Admitting: Family Medicine

## 2012-12-17 VITALS — BP 119/77 | HR 102 | Temp 98.7°F | Ht 71.0 in | Wt 184.0 lb

## 2012-12-17 DIAGNOSIS — E1139 Type 2 diabetes mellitus with other diabetic ophthalmic complication: Secondary | ICD-10-CM

## 2012-12-17 DIAGNOSIS — I10 Essential (primary) hypertension: Secondary | ICD-10-CM

## 2012-12-17 DIAGNOSIS — E11331 Type 2 diabetes mellitus with moderate nonproliferative diabetic retinopathy with macular edema: Secondary | ICD-10-CM

## 2012-12-17 DIAGNOSIS — E119 Type 2 diabetes mellitus without complications: Secondary | ICD-10-CM

## 2012-12-17 DIAGNOSIS — E11339 Type 2 diabetes mellitus with moderate nonproliferative diabetic retinopathy without macular edema: Secondary | ICD-10-CM

## 2012-12-17 DIAGNOSIS — E1165 Type 2 diabetes mellitus with hyperglycemia: Secondary | ICD-10-CM

## 2012-12-17 DIAGNOSIS — E11311 Type 2 diabetes mellitus with unspecified diabetic retinopathy with macular edema: Secondary | ICD-10-CM

## 2012-12-17 NOTE — Patient Instructions (Addendum)
Take the baby aspirin everyday.   For your diabetes, increase your morning insulin to 35 units, increase your nighttime insulin to 20 units. Remember to call me if your blood sugar is ever less than 100. Remember to eat 3 meals a day.   Get the orange card, we are going to try to refer you to the eye doctor once you have this.   For your blood pressure and to protect your kidneys, please take the lisinopril. Return in 2 weeks for a lab visit.   See me in 4 weeks,  Dr. Yong Channel  Health Maintenance Due  Topic Date Due  . Pneumococcal Polysaccharide Vaccine (#1)-will give when you get the orange card 06/19/1967  . Foot Exam -did today 06/19/1975  . Urine Microalbumin -dont need if you take the lisinopril 06/19/1975  . Tetanus/tdap -will give when you get the orange card 06/18/1984

## 2012-12-17 NOTE — Progress Notes (Signed)
Subjective:   #  DIABETES Type II Medications taking and tolerating-yes, nph-regular 30 in AM, 15 in PM. Patient has not yet picked up his gabapentin or lisinopril-planning on doing so today.  Blood Sugars per patient  -fasting: 235 avg, low 181 high 407  -before lunch 280 avg, low 189, high 405  -Before dinner 250 average 170 low 336 high Diet-admits times it is higher he may have had something sweet or ate heavy meal Regular Exercise-no due to neuropathy and not picking up medication yet  Health Maintenance Due  Topic Date Due  . Pneumococcal Polysaccharide Vaccine (#1)-will get once orange card 06/19/1967  . Foot Exam -completed today 06/19/1975  . Urine Microalbumin -will update as on ace 06/19/1975  . Tetanus/tdap --will get once orange card 06/18/1984  On Aspirin-not taking regularly On statin-not yet, cannot afford Daily foot monitoring-yes, lotions daily  ROS- Endorses Polyuria,Polydipsia, nocturia but states much improved, getting up once at night to urinate instead of 3-4x. Denies Vision changes Endorses pain in bilateral feet with associatednumbness + tingling. Denies  Hypoglycemia symptoms (shaky, sweaty, hungry,  anxious, tremor, palpitations, confusion, behavior change).   Hemoglobin a1c:  Lab Results  Component Value Date   HGBA1C >14.0 11/08/2012   HGBA1C 15.8* 05/06/2011   #  DIABETIC Retinopathy with macular edema Denies blurry vision. Denies floaters. This was found on our retinal scanner. We are attempting to establish follow up for the patient. He is applying for orange card. Spoke with Aris [place] there is possibly one optho office in [place] that will accept without insurance.   Hypertension- BP Readings from Last 3 Encounters:  12/17/12 119/77  12/03/12 148/70  11/23/12 141/73  Home BP monitoring-no Compliant with medications-no has not picked up lisinopril Denies any CP, HA, SOB, blurry vision, LE edema, transient weakness, orthopnea, PND.    ROS--See HPI  Past Medical History Patient Active Problem List   Diagnosis Date Noted  . Poorly controlled type 2 diabetes mellitus     Priority: High  . Hypertension     Priority: Medium  . Background diabetic retinopathy, with macular edema, with moderate nonproliferative retinopathy, associated with type 2 diabetes mellitus 12/15/2012  . Diabetic peripheral neuropathy 12/03/2012  . Pain in joint, ankle and foot 11/08/2012  . Shortness of breath 11/08/2012  . Weight loss 06/08/2012  Reviewed problem list.  Medications- reviewed and updated Chief complaint-noted  Objective: BP 119/77  Pulse 102  Temp(Src) 98.7 F (37.1 C) (Oral)  Ht 5\' 11"  (1.803 m)  Wt 184 lb (83.462 kg)  BMI 25.67 kg/m2 Gen: NAD, resting comfortably in chair CV: RRR no murmurs rubs or gallops Lungs: CTAB no crackles, wheeze, rhonchi Skin: warm, dry Neuro: grossly normal, moves all extremities Diabetic foot exam: DP/PT pulses 2+, no callous or lesion. No sensation on bottom of forefoot. Midfoot with sensation and heel without sensation to monofilament testing.   Assessment/Plan:

## 2012-12-18 NOTE — Assessment & Plan Note (Signed)
For some reason, good control even without medication today. Will still start lisinopril for renal protection and continue to monitor BP.

## 2012-12-18 NOTE — Assessment & Plan Note (Signed)
Awaiting orange card. Jeremiah [name] to look into  one optho office in [place] that may accept without insurance.

## 2012-12-18 NOTE — Assessment & Plan Note (Signed)
Still poorly controlled. Patient compliant with medications. Have not started metformin yet due to patient not being able to afford it and his focus on care for his neuropathy and previous rx for lisinopril. IF he can get these, our next step will be metformin and then cholesterol medication.   Will increase 70/30 to 35 units in AM and 20 units in PM. Will not seek tight control given diabetic retinopathy and fact that rapid tight control can worsen retinopathy. Our goal a1c would be <9 and in this patient I am hopeful that our next a1c is 10-11. Patient will need DM education when he gets the orange card.

## 2012-12-25 ENCOUNTER — Other Ambulatory Visit: Payer: Self-pay | Admitting: *Deleted

## 2012-12-25 MED ORDER — GABAPENTIN 300 MG PO CAPS: 300.0000 mg | ORAL_CAPSULE | Freq: Every day | ORAL | Status: DC

## 2012-12-25 MED ORDER — LISINOPRIL 10 MG PO TABS: 10.0000 mg | ORAL_TABLET | Freq: Every day | ORAL | Status: DC

## 2012-12-25 NOTE — Telephone Encounter (Signed)
Spoke with [employer].  Informed him the the office of Dr. Prudencio Burly at [facility] will accept him as a patient without insurance.  Gave patient office number and instructions to make and keep his appointment.   Asked patient about where he was in the orange card application.  Pt said he believed he filled out an application and was waiting for an appointment with Pamala Hurry.

## 2012-12-25 NOTE — Telephone Encounter (Signed)
Pt did not pick up prescription on 6/9 - requesting again. Tildon Husky, RN-BSN

## 2013-01-11 ENCOUNTER — Ambulatory Visit (INDEPENDENT_AMBULATORY_CARE_PROVIDER_SITE_OTHER): Payer: Self-pay | Admitting: Family Medicine

## 2013-01-11 ENCOUNTER — Encounter: Payer: Self-pay | Admitting: Family Medicine

## 2013-01-11 VITALS — BP 140/78 | HR 80 | Temp 98.0°F | Ht 71.0 in | Wt 184.0 lb

## 2013-01-11 DIAGNOSIS — E1165 Type 2 diabetes mellitus with hyperglycemia: Secondary | ICD-10-CM

## 2013-01-11 DIAGNOSIS — E1142 Type 2 diabetes mellitus with diabetic polyneuropathy: Secondary | ICD-10-CM

## 2013-01-11 DIAGNOSIS — I10 Essential (primary) hypertension: Secondary | ICD-10-CM

## 2013-01-11 DIAGNOSIS — E1149 Type 2 diabetes mellitus with other diabetic neurological complication: Secondary | ICD-10-CM

## 2013-01-11 DIAGNOSIS — E119 Type 2 diabetes mellitus without complications: Secondary | ICD-10-CM

## 2013-01-11 MED ORDER — LISINOPRIL 10 MG PO TABS: 10.0000 mg | ORAL_TABLET | Freq: Every day | ORAL | Status: DC

## 2013-01-11 MED ORDER — GABAPENTIN 300 MG PO CAPS: 300.0000 mg | ORAL_CAPSULE | Freq: Every day | ORAL | Status: DC

## 2013-01-11 NOTE — Patient Instructions (Signed)
For your diabetes, increase your morning insulin to 40 units, increase your nighttime insulin to 20 units. Remember to call me if your blood sugar is ever less than 100. Remember to eat 3 meals a day.   Get the orange card as soon as possible.   For your blood pressure and to protect your kidneys, please take the lisinopril. Return in 2 weeks for a lab visit.   See me in 4 weeks,  Dr. Yong Channel  Health Maintenance Due  Topic Date Due  . Pneumococcal Polysaccharide Vaccine (#1)-will give when you get the orange card 06/19/1967  . Urine Microalbumin -dont need if you take the lisinopril 06/19/1975  . Tetanus/tdap -will give when you get the orange card 06/18/1984

## 2013-01-11 NOTE — Progress Notes (Signed)
Subjective:   #  DIABETES Type II. Diabetic neuropathy.  Medications taking and tolerating-yes, nph-regular 35 in AM, 15 in PM. Not taking gabapentin yet for neuropathy. Blood Sugars per patient  -fasting: 350 avg, low 150 (x1) high 436  -before lunch 425   -Before dinner 400  Diet-admits a lot of heavy meals and sodas lately Regular Exercise-no due to neuropathy and not picking up medication yet  Health Maintenance Due  Topic Date Due  . Pneumococcal Polysaccharide Vaccine (#1)-once gets orange card 06/19/1967  . Tetanus/tdap -once gets orange card 06/18/1984  On Aspirin-not taking regularly On statin-not yet, cannot afford Daily foot monitoring-yes, lotions daily  ROS- Denies Polyuria,Polydipsia, nocturia relatively-1x a night which is stillbetter than previous. Denies Vision changes Endorses pain in bilateral feet with associatednumbness + tingling. Denies  Hypoglycemia symptoms (shaky, sweaty, hungry,  anxious, tremor, palpitations, confusion, behavior change).   Hemoglobin a1c:  Lab Results  Component Value Date   HGBA1C >14.0 11/08/2012   HGBA1C 15.8* 05/06/2011   # Hypertension BP Readings from Last 3 Encounters:  01/11/13 140/78  12/17/12 119/77  12/03/12 148/70  Home BP monitoring-no Compliant with medications-no has not picked up lisinopril Denies any CP, HA, SOB, blurry vision, LE edema, transient weakness, orthopnea, PND.   ROS--See HPI  Past Medical History Patient Active Problem List   Diagnosis Date Noted  . Poorly controlled type 2 diabetes mellitus     Priority: High  . Hypertension     Priority: Medium  . Background diabetic retinopathy, with macular edema, with moderate nonproliferative retinopathy, associated with type 2 diabetes mellitus 12/15/2012  . Diabetic peripheral neuropathy 12/03/2012  . Pain in joint, ankle and foot 11/08/2012  . Shortness of breath 11/08/2012  Reviewed problem list.  Medications- reviewed and updated Chief  complaint-noted  Objective: BP 140/78  Pulse 80  Temp(Src) 98 F (36.7 C) (Oral)  Ht 5\' 11"  (1.803 m)  Wt 184 lb (83.462 kg)  BMI 25.67 kg/m2  Gen: NAD, resting comfortably in chair, wearing his glasses CV: RRR no murmurs rubs or gallops Lungs: CTAB no crackles, wheeze, rhonchi Skin: warm, dry Neuro: grossly normal, moves all extremities Ext: no swelling  Assessment/Plan:

## 2013-01-11 NOTE — Assessment & Plan Note (Signed)
Poorly controlled. Needs to pick up lisinopril and start it. Advised him of this.

## 2013-01-11 NOTE — Assessment & Plan Note (Signed)
Poorly controlled as not taking neurontin yet. Refilled both this and lisinopril (given handwritten Rx for both as well as sending to pharmacy)

## 2013-01-11 NOTE — Assessment & Plan Note (Addendum)
Still poorly controlled. Patient reportedly compliant with insulin (did not increase PM dose to 20 units though). Will increase 70/30 to 40 units in AM and 20 units in PM.  Our goal a1c would be <9 and in this patient I am hopeful that our next a1c is 10-11.  Will not seek tight control given diabetic retinopathy and fact that rapid tight control can worsen retinopathy. Patient will need DM education when he gets the orange card (advised him to get this again today). Will refer to [facility] team as well.    Have not started metformin yet (cr 1.43 previously and want to avoid metformin and lisinopril start at same time). IF Cr normalizes on BMET recheck, our next step will be metformin. Will also need lipid agent (for now goal of him just picking up 2 current meds which has been in the works for months now).

## 2013-01-17 ENCOUNTER — Ambulatory Visit: Payer: Self-pay | Admitting: Family Medicine

## 2013-01-17 NOTE — Progress Notes (Unsigned)
Pt noshowed appt for OC application

## 2013-01-27 ENCOUNTER — Encounter (HOSPITAL_COMMUNITY): Payer: Self-pay | Admitting: Emergency Medicine

## 2013-01-27 ENCOUNTER — Emergency Department (HOSPITAL_COMMUNITY)
Admission: EM | Admit: 2013-01-27 | Discharge: 2013-01-27 | Disposition: A | Discharge status: Home / Self Care | Payer: Self-pay | Attending: Emergency Medicine | Admitting: Emergency Medicine

## 2013-01-27 DIAGNOSIS — E1149 Type 2 diabetes mellitus with other diabetic neurological complication: Secondary | ICD-10-CM | POA: Insufficient documentation

## 2013-01-27 DIAGNOSIS — I10 Essential (primary) hypertension: Secondary | ICD-10-CM | POA: Insufficient documentation

## 2013-01-27 DIAGNOSIS — Z79899 Other long term (current) drug therapy: Secondary | ICD-10-CM | POA: Insufficient documentation

## 2013-01-27 DIAGNOSIS — H7290 Unspecified perforation of tympanic membrane, unspecified ear: Secondary | ICD-10-CM | POA: Insufficient documentation

## 2013-01-27 DIAGNOSIS — E1142 Type 2 diabetes mellitus with diabetic polyneuropathy: Secondary | ICD-10-CM | POA: Insufficient documentation

## 2013-01-27 DIAGNOSIS — H7291 Unspecified perforation of tympanic membrane, right ear: Secondary | ICD-10-CM

## 2013-01-27 DIAGNOSIS — Z8739 Personal history of other diseases of the musculoskeletal system and connective tissue: Secondary | ICD-10-CM | POA: Insufficient documentation

## 2013-01-27 DIAGNOSIS — H6691 Otitis media, unspecified, right ear: Secondary | ICD-10-CM

## 2013-01-27 DIAGNOSIS — Z794 Long term (current) use of insulin: Secondary | ICD-10-CM | POA: Insufficient documentation

## 2013-01-27 DIAGNOSIS — Z7982 Long term (current) use of aspirin: Secondary | ICD-10-CM | POA: Insufficient documentation

## 2013-01-27 DIAGNOSIS — H6690 Otitis media, unspecified, unspecified ear: Secondary | ICD-10-CM | POA: Insufficient documentation

## 2013-01-27 MED ORDER — HYDROCODONE-ACETAMINOPHEN 5-325 MG PO TABS: 1.0000 | ORAL_TABLET | ORAL | Status: DC | PRN

## 2013-01-27 MED ORDER — OXYCODONE-ACETAMINOPHEN 5-325 MG PO TABS
1.0000 | ORAL_TABLET | Freq: Once | ORAL | Status: AC
  Administered 2013-01-27: 1 via ORAL
  Filled 2013-01-27: qty 1

## 2013-01-27 MED ORDER — AMOXICILLIN 500 MG PO CAPS: 500.0000 mg | ORAL_CAPSULE | Freq: Three times a day (TID) | ORAL | Status: DC

## 2013-01-27 NOTE — ED Notes (Signed)
Bleeding from Right ear started 2 days ago--- states had earache before bleeding started. A/O x 3--

## 2013-01-27 NOTE — ED Provider Notes (Signed)
Medical screening examination/treatment/procedure(s) were performed by non-physician practitioner and as supervising physician I was immediately available for consultation/collaboration.  Richarda Blade, MD 01/27/13 [phone]
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
## 2013-01-29 ENCOUNTER — Emergency Department (HOSPITAL_COMMUNITY): Payer: Self-pay

## 2013-01-29 ENCOUNTER — Encounter (HOSPITAL_COMMUNITY): Payer: Self-pay | Admitting: *Deleted

## 2013-01-29 ENCOUNTER — Emergency Department (HOSPITAL_COMMUNITY)
Admission: EM | Admit: 2013-01-29 | Discharge: 2013-01-29 | Disposition: A | Discharge status: Home / Self Care | Payer: Self-pay | Attending: Emergency Medicine | Admitting: Emergency Medicine

## 2013-01-29 DIAGNOSIS — G8929 Other chronic pain: Secondary | ICD-10-CM | POA: Insufficient documentation

## 2013-01-29 DIAGNOSIS — E1149 Type 2 diabetes mellitus with other diabetic neurological complication: Secondary | ICD-10-CM | POA: Insufficient documentation

## 2013-01-29 DIAGNOSIS — Z794 Long term (current) use of insulin: Secondary | ICD-10-CM | POA: Insufficient documentation

## 2013-01-29 DIAGNOSIS — H60399 Other infective otitis externa, unspecified ear: Secondary | ICD-10-CM | POA: Insufficient documentation

## 2013-01-29 DIAGNOSIS — H7291 Unspecified perforation of tympanic membrane, right ear: Secondary | ICD-10-CM

## 2013-01-29 DIAGNOSIS — H9211 Otorrhea, right ear: Secondary | ICD-10-CM

## 2013-01-29 DIAGNOSIS — H6091 Unspecified otitis externa, right ear: Secondary | ICD-10-CM

## 2013-01-29 DIAGNOSIS — H66019 Acute suppurative otitis media with spontaneous rupture of ear drum, unspecified ear: Secondary | ICD-10-CM | POA: Insufficient documentation

## 2013-01-29 DIAGNOSIS — I10 Essential (primary) hypertension: Secondary | ICD-10-CM | POA: Insufficient documentation

## 2013-01-29 DIAGNOSIS — Z79899 Other long term (current) drug therapy: Secondary | ICD-10-CM | POA: Insufficient documentation

## 2013-01-29 DIAGNOSIS — E1142 Type 2 diabetes mellitus with diabetic polyneuropathy: Secondary | ICD-10-CM | POA: Insufficient documentation

## 2013-01-29 DIAGNOSIS — Z7982 Long term (current) use of aspirin: Secondary | ICD-10-CM | POA: Insufficient documentation

## 2013-01-29 DIAGNOSIS — M1990 Unspecified osteoarthritis, unspecified site: Secondary | ICD-10-CM | POA: Insufficient documentation

## 2013-01-29 LAB — BASIC METABOLIC PANEL
BUN: 19 mg/dL (ref 6–23)
CO2: 26 mEq/L (ref 19–32)
Calcium: 9 mg/dL (ref 8.4–10.5)
Chloride: 102 mEq/L (ref 96–112)
Creatinine, Ser: 1.1 mg/dL (ref 0.50–1.35)
GFR calc Af Amer: >90 mL/min (ref >90)
GFR calc non Af Amer: 78 mL/min — ABNORMAL LOW (ref >90)
Glucose, Bld: 300 mg/dL — ABNORMAL HIGH (ref 70–99)
Potassium: 4.1 mEq/L (ref 3.5–5.1)
Sodium: 134 mEq/L — ABNORMAL LOW (ref 135–145)

## 2013-01-29 LAB — CBC WITH DIFFERENTIAL/PLATELET
Basophils Absolute: 0 10*3/uL (ref 0.0–0.1)
Basophils Relative: 0 % (ref 0–1)
Eosinophils Absolute: 0.1 10*3/uL (ref 0.0–0.7)
Eosinophils Relative: 1 % (ref 0–5)
HCT: 33.2 % — ABNORMAL LOW (ref 39.0–52.0)
Hemoglobin: 11.3 g/dL — ABNORMAL LOW (ref 13.0–17.0)
Lymphocytes Relative: 23 % (ref 12–46)
Lymphs Abs: 1.5 10*3/uL (ref 0.7–4.0)
MCH: 24.3 pg — ABNORMAL LOW (ref 26.0–34.0)
MCHC: 34 g/dL (ref 30.0–36.0)
MCV: 71.4 fL — ABNORMAL LOW (ref 78.0–100.0)
Monocytes Absolute: 0.7 10*3/uL (ref 0.1–1.0)
Monocytes Relative: 11 % (ref 3–12)
Neutro Abs: 4.3 10*3/uL (ref 1.7–7.7)
Neutrophils Relative %: 65 % (ref 43–77)
Platelets: 204 10*3/uL (ref 150–400)
RBC: 4.65 MIL/uL (ref 4.22–5.81)
RDW: 14.1 % (ref 11.5–15.5)
WBC: 6.6 10*3/uL (ref 4.0–10.5)

## 2013-01-29 MED ORDER — CLINDAMYCIN HCL 150 MG PO CAPS: 150.0000 mg | ORAL_CAPSULE | Freq: Four times a day (QID) | ORAL | Status: DC

## 2013-01-29 MED ORDER — OXYCODONE-ACETAMINOPHEN 5-325 MG PO TABS: 1.0000 | ORAL_TABLET | ORAL | Status: DC | PRN

## 2013-01-29 MED ORDER — SODIUM CHLORIDE 0.9 % IV BOLUS (SEPSIS)
1000.0000 mL | Freq: Once | INTRAVENOUS | Status: AC
  Administered 2013-01-29: 1000 mL via INTRAVENOUS

## 2013-01-29 MED ORDER — OXYCODONE-ACETAMINOPHEN 5-325 MG PO TABS
1.0000 | ORAL_TABLET | Freq: Once | ORAL | Status: AC
  Administered 2013-01-29: 1 via ORAL
  Filled 2013-01-29: qty 1

## 2013-01-29 MED ORDER — ONDANSETRON HCL 4 MG/2ML IJ SOLN
4.0000 mg | Freq: Once | INTRAMUSCULAR | Status: AC
  Administered 2013-01-29: 4 mg via INTRAVENOUS
  Filled 2013-01-29: qty 2

## 2013-01-29 MED ORDER — DEXTROSE 5 % IV SOLN
1.0000 g | Freq: Once | INTRAVENOUS | Status: AC
  Administered 2013-01-29: 1 g via INTRAVENOUS
  Filled 2013-01-29: qty 10

## 2013-01-29 MED ORDER — LEVOFLOXACIN IN D5W 750 MG/150ML IV SOLN
750.0000 mg | Freq: Once | INTRAVENOUS | Status: AC
  Administered 2013-01-29: 750 mg via INTRAVENOUS
  Filled 2013-01-29: qty 150

## 2013-01-29 MED ORDER — MORPHINE SULFATE 4 MG/ML IJ SOLN
4.0000 mg | Freq: Once | INTRAMUSCULAR | Status: AC
  Administered 2013-01-29: 4 mg via INTRAVENOUS
  Filled 2013-01-29: qty 1

## 2013-01-29 MED ORDER — NEOMYCIN-COLIST-HC-THONZONIUM 3.3-3-10-0.5 MG/ML OT SUSP
4.0000 [drp] | Freq: Once | OTIC | Status: AC
  Administered 2013-01-29: 4 [drp] via OTIC
  Filled 2013-01-29 (×2): qty 5

## 2013-01-29 NOTE — ED Notes (Signed)
Seen here few nites ago and had right ruptured ear drum.   Pt is having right side of head pain and hurts to move jaw.

## 2013-01-29 NOTE — ED Notes (Addendum)
Pt alert, NAD, calm, interactive, skin W&D, resp se/u, speaking in clear complete sentences, family x2 at [facility], EDNP in to room at [facility], plan & process explained. Pt denies pain or nausea.
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
## 2013-01-29 NOTE — ED Notes (Signed)
No answer when called for vital sign recheck

## 2013-01-30 ENCOUNTER — Ambulatory Visit (INDEPENDENT_AMBULATORY_CARE_PROVIDER_SITE_OTHER): Payer: Self-pay | Admitting: Family Medicine

## 2013-01-30 ENCOUNTER — Encounter: Payer: Self-pay | Admitting: Family Medicine

## 2013-01-30 VITALS — BP 145/95 | HR 103 | Temp 98.1°F | Wt 185.0 lb

## 2013-01-30 DIAGNOSIS — H9201 Otalgia, right ear: Secondary | ICD-10-CM | POA: Insufficient documentation

## 2013-01-30 DIAGNOSIS — H9209 Otalgia, unspecified ear: Secondary | ICD-10-CM

## 2013-01-30 NOTE — Patient Instructions (Signed)
Nice to meet you. I am glad you are feeling better. Please pick up the medications the ED prescribed and call Dr. Redmond Baseman today. If you are not able to get in to see Dr. Redmond Baseman in the next 2 days, the ED wanted you to return there to have the ear wic removed.

## 2013-01-30 NOTE — Progress Notes (Signed)
  Subjective:    Patient ID: Jeremiah [name], male    DOB: 1965-09-28, 47 y.o.   MRN: [id number]  HPI Patient is a 47 yo male who presents for f/u of right ear pain.  Was in the ED over night due to pain. Diagnosed with otitis externa. Ear wic placed and antibiotics IV levaquin given and prescribed PO clinda. Notably recently had perforated ear drum. States doing much better now. Pain improved. Is going to call Dr. Redmond Baseman today to be evaluated for his perforated ear drum.    Review of Systems see HPI     Objective:   Physical Exam  Constitutional: He appears well-developed and well-nourished.  HENT:  Head: Normocephalic and atraumatic.  Left Ear: External ear normal.  Mouth/Throat: Oropharynx is clear and moist.  Left TM normal, right ear with ear wic in place  Eyes: Conjunctivae are normal. Pupils are equal, round, and reactive to light.  Neck: Neck supple.  Lymphadenopathy:    He has no cervical adenopathy.  BP 145/95  Pulse 103  Temp(Src) 98.1 F (36.7 C) (Oral)  Wt 185 lb (83.915 kg)  BMI 25.81 kg/m2    Assessment & Plan:

## 2013-01-30 NOTE — Assessment & Plan Note (Signed)
Improved following ED evaluation. Advised to take antibiotics and f/u with Dr. Redmond Baseman.

## 2013-01-31 NOTE — ED Provider Notes (Signed)
Medical screening examination/treatment/procedure(s) were performed by non-physician practitioner and as supervising physician I was immediately available for consultation/collaboration.   Mirna Mires, MD 01/31/13 [phone]

## 2013-02-12 ENCOUNTER — Ambulatory Visit: Payer: Self-pay | Admitting: Family Medicine

## 2013-05-02 ENCOUNTER — Ambulatory Visit: Payer: Self-pay | Admitting: Family Medicine

## 2013-06-14 ENCOUNTER — Telehealth: Payer: Self-pay | Admitting: *Deleted

## 2013-06-14 DIAGNOSIS — E1165 Type 2 diabetes mellitus with hyperglycemia: Secondary | ICD-10-CM

## 2013-06-14 NOTE — Telephone Encounter (Signed)
LMOVM for pt to return call.  Please schedule fasting lab appt for cholesterol check. Manahil Vanzile, Salome Spotted

## 2013-06-27 DIAGNOSIS — J189 Pneumonia, unspecified organism: Secondary | ICD-10-CM

## 2013-06-27 HISTORY — DX: Pneumonia, unspecified organism: J18.9

## 2013-07-04 ENCOUNTER — Encounter: Payer: Self-pay | Admitting: Home Health Services

## 2013-07-23 ENCOUNTER — Ambulatory Visit (INDEPENDENT_AMBULATORY_CARE_PROVIDER_SITE_OTHER): Payer: Self-pay | Admitting: Family Medicine

## 2013-07-23 VITALS — BP 115/83 | HR 97 | Temp 98.8°F | Ht 71.0 in | Wt 189.0 lb

## 2013-07-23 DIAGNOSIS — I10 Essential (primary) hypertension: Secondary | ICD-10-CM

## 2013-07-23 DIAGNOSIS — E1165 Type 2 diabetes mellitus with hyperglycemia: Secondary | ICD-10-CM

## 2013-07-23 DIAGNOSIS — E1149 Type 2 diabetes mellitus with other diabetic neurological complication: Secondary | ICD-10-CM

## 2013-07-23 DIAGNOSIS — E1142 Type 2 diabetes mellitus with diabetic polyneuropathy: Secondary | ICD-10-CM

## 2013-07-23 DIAGNOSIS — E119 Type 2 diabetes mellitus without complications: Secondary | ICD-10-CM

## 2013-07-23 LAB — POCT GLYCOSYLATED HEMOGLOBIN (HGB A1C): Hemoglobin A1C: 13.1

## 2013-07-23 MED ORDER — GABAPENTIN 300 MG PO CAPS: 300.0000 mg | ORAL_CAPSULE | Freq: Every day | ORAL | Status: DC

## 2013-07-23 NOTE — Patient Instructions (Addendum)
Diabetes  Continue taking 40 units of 70/30 insulin in the morning  Start taking 20 units in the evening before dinner (if you miss lunch then take 10 units).   DO NOT MISS DINNER  I refilled your gabapentin as well. Start taking an aspirin 81mg  everyday.   See me in a week, we likely need to get you on cholesterol medicine.   Thanks, Dr. Yong Channel  YOU NEED THE ORANGE CARD-please star this process again. E cannot do the following until you get it: Health Maintenance Due  Topic Date Due  . Pneumococcal Polysaccharide Vaccine (##1) 06/19/1967  . Tetanus/tdap  06/18/1984  . Influenza Vaccine  01/25/2013

## 2013-07-23 NOTE — Assessment & Plan Note (Signed)
a1c 13.1. Patient essentially rarely taking nighttime insulin. Have encouraged him to restart 20 units 70/30 at night (was already usually taking 40 units in AM). I have asked him to return in a week to see what adjustments we need to make. Cannot afford to miss meals which has led to hypoglycemia. Advised half dose of dinner insulin if no lunch and no dinner insulin if he does not eat.

## 2013-07-23 NOTE — Assessment & Plan Note (Signed)
Well controlled off lisinopril. Do want patient to be on for renal protective effects but will continue to monitor for now as well controlled and send in BMET once gets orange card.

## 2013-07-23 NOTE — Assessment & Plan Note (Signed)
Restarted gabapentin. Will slowly titrate up but hopeful this will help with neuropathy.

## 2013-07-23 NOTE — Progress Notes (Signed)
Jeremiah Reddish, MD Phone: [phone]  Subjective:  Chief complaint-noted  # DIABETES Type II/diabetic neuropathy/hypertension Medications taking and tolerating-yes, nph/regular 70/30 40 units in AM, only takes 15-20 units in PM about 1-2 x a week.  Blood Sugars per patient-fasting-250, before lunch in 300s. Otherwise checks sporadically  Defers the following as no insurance Health Maintenance Due  Topic Date Due  . Pneumococcal Polysaccharide Vaccine (##1) 06/19/1967  . Tetanus/tdap  06/18/1984  . Influenza Vaccine  01/25/2013  On Aspirin-stopped taking On statin-LDL today and then will discuss  ROS- Endorses nocturia. 10/10 burning in bilateral feet and ran out of gabapentin. Endorses Hypoglycemia symptoms with CBG into 50s when missed dinner one day but took insulin. Endorses 8/10 eye pain after eye surgery a month ago-encouraged patient to call eye doctor. Feels like they are going to "pop out"  HTN-ran out of lisinopril, no longer taking aspirin ROS-no chest pain or shortness of breath  Hemoglobin a1c:  Lab Results  Component Value Date   HGBA1C 13.1 07/23/2013   HGBA1C >14.0 11/08/2012   HGBA1C 15.8* 05/06/2011   Past Medical History Patient Active Problem List   Diagnosis Date Noted  . Poorly controlled type 2 diabetes mellitus     Priority: High  . Hypertension     Priority: Medium  . Background diabetic retinopathy, with macular edema, with moderate nonproliferative retinopathy, associated with type 2 diabetes mellitus 12/15/2012  . Diabetic peripheral neuropathy 12/03/2012    Medications- reviewed and updated Current Outpatient Prescriptions  Medication Sig Dispense Refill  . aspirin 81 MG EC tablet Take 81 mg by mouth daily. Swallow whole.      . gabapentin (NEURONTIN) 300 MG capsule Take 1 capsule (300 mg total) by mouth at bedtime.  30 capsule  3  . insulin NPH-regular (NOVOLIN 70/30) (70-30) 100 UNIT/ML injection Inject 20-30 Units into the skin 2 (two)  times daily with a meal. Take 30 units before breakfast and 20 units before dinner.       No current facility-administered medications for this visit.    Objective: BP 115/83  Pulse 97  Temp(Src) 98.8 F (37.1 C) (Oral)  Ht 5\' 11"  (1.803 m)  Wt 189 lb (85.73 kg)  BMI 26.37 kg/m2 Gen: NAD, resting comfortably in chair Eyes: not painful to compression, not firm.  CV: regular rate and rhythm no murmurs rubs or gallops Lungs: CTAB no crackles, wheeze, rhonchi Ext: no edema  Assessment/Plan:  Poorly controlled type 2 diabetes mellitus a1c 13.1. Patient essentially rarely taking nighttime insulin. Have encouraged him to restart 20 units 70/30 at night (was already usually taking 40 units in AM). I have asked him to return in a week to see what adjustments we need to make. Cannot afford to miss meals which has led to hypoglycemia. Advised half dose of dinner insulin if no lunch and no dinner insulin if he does not eat.   Hypertension Well controlled off lisinopril. Do want patient to be on for renal protective effects but will continue to monitor for now as well controlled and send in BMET once gets orange card.   Diabetic peripheral neuropathy Restarted gabapentin. Will slowly titrate up but hopeful this will help with neuropathy.     Orders Placed This Encounter  Procedures  . LDL Cholesterol, Direct  . HgB A1c    Meds ordered this encounter  Medications  . gabapentin (NEURONTIN) 300 MG capsule    Sig: Take 1 capsule (300 mg total) by mouth at bedtime.  Dispense:  30 capsule    Refill:  3   Patient also signed ROI to get records from care received here for "legal reasons". I discussed with patient that I did not feel comfortable filling out disability paperwork for his neuropathy until his a1c was controlled and we had made reasonable attempt with gabapentin. He understands.

## 2013-07-24 LAB — LDL CHOLESTEROL, DIRECT: Direct LDL: 166 mg/dL — ABNORMAL HIGH

## 2013-07-25 ENCOUNTER — Encounter: Payer: Self-pay | Admitting: Family Medicine

## 2013-07-25 DIAGNOSIS — E785 Hyperlipidemia, unspecified: Secondary | ICD-10-CM | POA: Insufficient documentation

## 2013-08-12 ENCOUNTER — Emergency Department (HOSPITAL_COMMUNITY): Payer: Self-pay

## 2013-08-12 ENCOUNTER — Encounter (HOSPITAL_COMMUNITY): Payer: Self-pay | Admitting: Emergency Medicine

## 2013-08-12 ENCOUNTER — Emergency Department (HOSPITAL_COMMUNITY)
Admission: EM | Admit: 2013-08-12 | Discharge: 2013-08-12 | Disposition: A | Discharge status: Home / Self Care | Payer: Self-pay | Attending: Emergency Medicine | Admitting: Emergency Medicine

## 2013-08-12 DIAGNOSIS — E1165 Type 2 diabetes mellitus with hyperglycemia: Secondary | ICD-10-CM

## 2013-08-12 DIAGNOSIS — Z7982 Long term (current) use of aspirin: Secondary | ICD-10-CM | POA: Insufficient documentation

## 2013-08-12 DIAGNOSIS — R079 Chest pain, unspecified: Secondary | ICD-10-CM | POA: Insufficient documentation

## 2013-08-12 DIAGNOSIS — G8929 Other chronic pain: Secondary | ICD-10-CM | POA: Insufficient documentation

## 2013-08-12 DIAGNOSIS — M1990 Unspecified osteoarthritis, unspecified site: Secondary | ICD-10-CM | POA: Insufficient documentation

## 2013-08-12 DIAGNOSIS — J159 Unspecified bacterial pneumonia: Secondary | ICD-10-CM | POA: Insufficient documentation

## 2013-08-12 DIAGNOSIS — I10 Essential (primary) hypertension: Secondary | ICD-10-CM | POA: Insufficient documentation

## 2013-08-12 DIAGNOSIS — Z794 Long term (current) use of insulin: Secondary | ICD-10-CM | POA: Insufficient documentation

## 2013-08-12 DIAGNOSIS — E1149 Type 2 diabetes mellitus with other diabetic neurological complication: Secondary | ICD-10-CM | POA: Insufficient documentation

## 2013-08-12 DIAGNOSIS — E1142 Type 2 diabetes mellitus with diabetic polyneuropathy: Secondary | ICD-10-CM | POA: Insufficient documentation

## 2013-08-12 DIAGNOSIS — J189 Pneumonia, unspecified organism: Secondary | ICD-10-CM

## 2013-08-12 LAB — BASIC METABOLIC PANEL
BUN: 19 mg/dL (ref 6–23)
CO2: 25 mEq/L (ref 19–32)
Calcium: 9.7 mg/dL (ref 8.4–10.5)
Chloride: 93 mEq/L — ABNORMAL LOW (ref 96–112)
Creatinine, Ser: 1.12 mg/dL (ref 0.50–1.35)
GFR calc Af Amer: 88 mL/min — ABNORMAL LOW (ref >90)
GFR calc non Af Amer: 76 mL/min — ABNORMAL LOW (ref >90)
Glucose, Bld: 526 mg/dL — ABNORMAL HIGH (ref 70–99)
Potassium: 4.4 mEq/L (ref 3.7–5.3)
Sodium: 133 mEq/L — ABNORMAL LOW (ref 137–147)

## 2013-08-12 LAB — CBC WITH DIFFERENTIAL/PLATELET
Basophils Absolute: 0 10*3/uL (ref 0.0–0.1)
Basophils Relative: 0 % (ref 0–1)
Eosinophils Absolute: 0 10*3/uL (ref 0.0–0.7)
Eosinophils Relative: 0 % (ref 0–5)
HCT: 36.7 % — ABNORMAL LOW (ref 39.0–52.0)
Hemoglobin: 12.4 g/dL — ABNORMAL LOW (ref 13.0–17.0)
Lymphocytes Relative: 15 % (ref 12–46)
Lymphs Abs: 1.1 10*3/uL (ref 0.7–4.0)
MCH: 23.9 pg — ABNORMAL LOW (ref 26.0–34.0)
MCHC: 33.8 g/dL (ref 30.0–36.0)
MCV: 70.8 fL — ABNORMAL LOW (ref 78.0–100.0)
Monocytes Absolute: 0.6 10*3/uL (ref 0.1–1.0)
Monocytes Relative: 8 % (ref 3–12)
Neutro Abs: 5.9 10*3/uL (ref 1.7–7.7)
Neutrophils Relative %: 77 % (ref 43–77)
Platelets: 267 10*3/uL (ref 150–400)
RBC: 5.18 MIL/uL (ref 4.22–5.81)
RDW: 13.6 % (ref 11.5–15.5)
WBC: 7.6 10*3/uL (ref 4.0–10.5)

## 2013-08-12 LAB — TROPONIN I: Troponin I: <0.3 ng/mL (ref <0.30)

## 2013-08-12 LAB — GLUCOSE, CAPILLARY: Glucose-Capillary: 329 mg/dL — ABNORMAL HIGH (ref 70–99)

## 2013-08-12 MED ORDER — CEFTRIAXONE SODIUM 1 G IJ SOLR
1.0000 g | Freq: Once | INTRAMUSCULAR | Status: AC
  Administered 2013-08-12: 1 g via INTRAVENOUS
  Filled 2013-08-12: qty 10

## 2013-08-12 MED ORDER — AZITHROMYCIN 250 MG PO TABS
500.0000 mg | ORAL_TABLET | Freq: Once | ORAL | Status: AC
  Administered 2013-08-12: 500 mg via ORAL
  Filled 2013-08-12: qty 2

## 2013-08-12 MED ORDER — SODIUM CHLORIDE 0.9 % IV BOLUS (SEPSIS)
1000.0000 mL | Freq: Once | INTRAVENOUS | Status: AC
  Administered 2013-08-12: 1000 mL via INTRAVENOUS

## 2013-08-12 MED ORDER — INSULIN ASPART 100 UNIT/ML ~~LOC~~ SOLN
15.0000 [IU] | Freq: Once | SUBCUTANEOUS | Status: AC
Start: 2013-08-12 — End: 2013-08-12
  Administered 2013-08-12: 15 [IU] via INTRAVENOUS
  Filled 2013-08-12: qty 1

## 2013-08-12 MED ORDER — AZITHROMYCIN 250 MG PO TABS: 250.0000 mg | ORAL_TABLET | Freq: Every day | ORAL | Status: DC

## 2013-08-12 MED ORDER — IOHEXOL 350 MG/ML SOLN
100.0000 mL | Freq: Once | INTRAVENOUS | Status: AC | PRN
  Administered 2013-08-12: 100 mL via INTRAVENOUS

## 2013-08-12 MED ORDER — MORPHINE SULFATE 4 MG/ML IJ SOLN
6.0000 mg | Freq: Once | INTRAMUSCULAR | Status: AC
  Administered 2013-08-12: 6 mg via INTRAVENOUS
  Filled 2013-08-12: qty 2

## 2013-08-12 MED ORDER — TRAMADOL HCL 50 MG PO TABS: 50.0000 mg | ORAL_TABLET | Freq: Four times a day (QID) | ORAL | Status: DC | PRN

## 2013-08-12 MED ORDER — ONDANSETRON HCL 4 MG/2ML IJ SOLN
4.0000 mg | Freq: Once | INTRAMUSCULAR | Status: AC
  Administered 2013-08-12: 4 mg via INTRAVENOUS
  Filled 2013-08-12: qty 2

## 2013-08-12 NOTE — ED Notes (Signed)
Ambulates without distress. Respirations easy non labored.

## 2013-08-12 NOTE — ED Notes (Signed)
Initial Contact - pt resting on stretcher, reports R sided, R mid back pain 10/10 since yesterday.  Reproducible with deep inspiration.  Nonradiating.  Pt denies other complaints.  Skin PWD.  MAEI.  Speaking full/clear sentences.  NAD.  Maintained on cardiac/02 monitor.

## 2013-08-12 NOTE — ED Notes (Signed)
Patient is alert and oriented x3.  He is complaining of back and chest pain that started 2 days ago. He states that he has the chest pain when he takes a deep breath on the right side of his chest.  He denies any cardiac history or having this issue before.  Currently he rates his pain a 10 of 10 constant.
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
## 2013-08-12 NOTE — ED Notes (Signed)
Completing fluids before discharge.

## 2013-09-10 ENCOUNTER — Ambulatory Visit (INDEPENDENT_AMBULATORY_CARE_PROVIDER_SITE_OTHER): Payer: Self-pay | Admitting: Family Medicine

## 2013-09-10 VITALS — Temp 98.6°F | Ht 71.0 in | Wt 179.0 lb

## 2013-09-10 DIAGNOSIS — K047 Periapical abscess without sinus: Secondary | ICD-10-CM | POA: Insufficient documentation

## 2013-09-10 MED ORDER — HYDROCODONE-ACETAMINOPHEN 5-300 MG PO TABS: 1.0000 | ORAL_TABLET | Freq: Four times a day (QID) | ORAL | Status: DC | PRN

## 2013-09-10 MED ORDER — HYDROCODONE-ACETAMINOPHEN 5-300 MG PO TABS
1.0000 | ORAL_TABLET | Freq: Four times a day (QID) | ORAL | Status: DC | PRN
Start: 2013-09-10 — End: 2013-12-19

## 2013-09-10 MED ORDER — PENICILLIN V POTASSIUM 500 MG PO TABS: 500.0000 mg | ORAL_TABLET | Freq: Four times a day (QID) | ORAL | Status: DC

## 2013-09-10 NOTE — Patient Instructions (Signed)
°  Dental Abscess °A dental abscess is a collection of infected fluid (pus) from a bacterial infection in the inner part of the tooth (pulp). It usually occurs at the end of the tooth's root.  °CAUSES  °· Severe tooth decay. °· Trauma to the tooth that allows bacteria to enter into the pulp, such as a broken or chipped tooth. °SYMPTOMS  °· Severe pain in and around the infected tooth. °· Swelling and redness around the abscessed tooth or in the mouth or face. °· Tenderness. °· Pus drainage. °· Bad breath. °· Bitter taste in the mouth. °· Difficulty swallowing. °· Difficulty opening the mouth. °· Nausea. °· Vomiting. °· Chills. °· Swollen neck glands. °DIAGNOSIS  °· A medical and dental history will be taken. °· An examination will be performed by tapping on the abscessed tooth. °· X-rays may be taken of the tooth to identify the abscess. °TREATMENT °The goal of treatment is to eliminate the infection. You may be prescribed antibiotic medicine to stop the infection from spreading. A root canal may be performed to save the tooth. If the tooth cannot be saved, it may be pulled (extracted) and the abscess may be drained.  °HOME CARE INSTRUCTIONS °· Only take over-the-counter or prescription medicines for pain, fever, or discomfort as directed by your caregiver. °· Rinse your mouth (gargle) often with salt water (¼ tsp salt in 8 oz [250 ml] of warm water) to relieve pain or swelling. °· Do not drive after taking pain medicine (narcotics). °· Do not apply heat to the outside of your face. °· Return to your dentist for further treatment as directed. °SEEK MEDICAL CARE IF: °· Your pain is not helped by medicine. °· Your pain is getting worse instead of better. °SEEK IMMEDIATE MEDICAL CARE IF: °· You have a fever or persistent symptoms for more than 2 3 days. °· You have a fever and your symptoms suddenly get worse. °· You have chills or a very bad headache. °· You have problems breathing or swallowing. °· You have trouble  opening your mouth. °· You have swelling in the neck or around the eye. °Document Released: 06/13/2005 Document Revised: 03/07/2012 Document Reviewed: 09/21/2010 °[employer]® Patient Information ©2014 [employer]. ° ° °

## 2013-09-10 NOTE — Progress Notes (Signed)
Family Medicine Office Visit Note   Subjective:   Patient ID: Jeremiah [name], male  DOB: 12-Dec-1965, 48 y.o.. MRN: [id number]   Pt that comes today complaining of dental pain for 2 days. He also reports chills and subjective fever, as well as mild swelling on the left side of his face. Denies nausea, vomiting, rashes, or other systemic symptoms. Patient is able to drink fluids. He has Type 2 uncontrolled DM and reports his CBG's have been in the high 150' with no signs of hypo or hyperglycemia.   Review of Systems:  Per HPI  Objective:   Physical Exam: Gen:  In mild distress, tearful due to reported pain.  HEENT: Moist mucous membranes. Mouth: maceration seen around 3rd lower left molar. No drainage but marked erythema.  Neck supple. No adenopathies  CV: Regular rate and rhythm, no murmurs rubs or gallops PULM: Clear to auscultation bilaterally. No wheezes/rales/rhonchi EXT: No edema Neuro: Alert and oriented x3. No focalization  Assessment & Plan:

## 2013-09-10 NOTE — Assessment & Plan Note (Addendum)
No active drainage but physical exam shows localized infection site. Hemodynamically stable. Reports CBG's are in the high 150's. Start oral abx and pain control Advise to get evaluated with dentist as soon as possible.  Recommended to adjust insulin to CBG's now that he has an active infection and this can influence negative in healing process.

## 2013-09-11 ENCOUNTER — Ambulatory Visit: Payer: Self-pay

## 2013-09-11 ENCOUNTER — Ambulatory Visit (INDEPENDENT_AMBULATORY_CARE_PROVIDER_SITE_OTHER): Payer: Self-pay | Admitting: *Deleted

## 2013-09-11 DIAGNOSIS — E1165 Type 2 diabetes mellitus with hyperglycemia: Secondary | ICD-10-CM

## 2013-09-11 DIAGNOSIS — E162 Hypoglycemia, unspecified: Secondary | ICD-10-CM

## 2013-09-11 DIAGNOSIS — E119 Type 2 diabetes mellitus without complications: Secondary | ICD-10-CM

## 2013-09-11 LAB — GLUCOSE, CAPILLARY: Glucose-Capillary: 153 mg/dL — ABNORMAL HIGH (ref 70–99)

## 2013-09-11 NOTE — Progress Notes (Signed)
   Pt in clinic complaining of low blood sugar.  Pt stated he took insulin this AM, but has not had anything to eat since this morning.  Blood glucose level checked-153.  Crackers and Sprite given.  Pt stated blood glucose level usually runs in the 400s.  Derl Barrow, RN

## 2013-10-26 IMAGING — CT CT ABD-PELV W/O CM
2 of 4 series · 14 of 32 positions shown, 19 images · non-contrast
Comparison: None.

CLINICAL DATA: Left lower quadrant pain.  Left-sided back pain for
several weeks.  No nausea, vomiting, or diarrhea.  No fever or
chills.

CT ABDOMEN AND PELVIS WITHOUT CONTRAST
TECHNIQUE: Multidetector CT imaging of the abdomen and pelvis was
performed following the standard protocol without intravenous
contrast.

[Series 2: renal stone · axial · 0.75mm/px · z∈[-440,-80]mm · 8 of 94 slices shown, 13 images]
[im 11/94  soft-tissue]
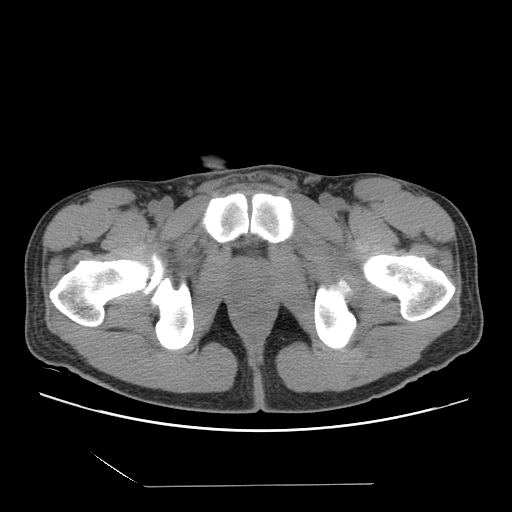
[im 11/94  bone]
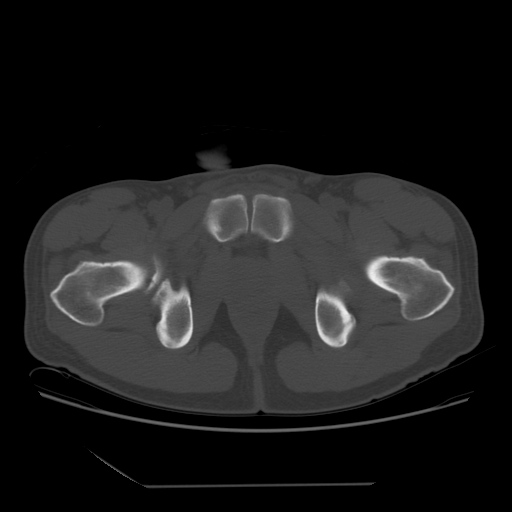
[im 21/94  soft-tissue]
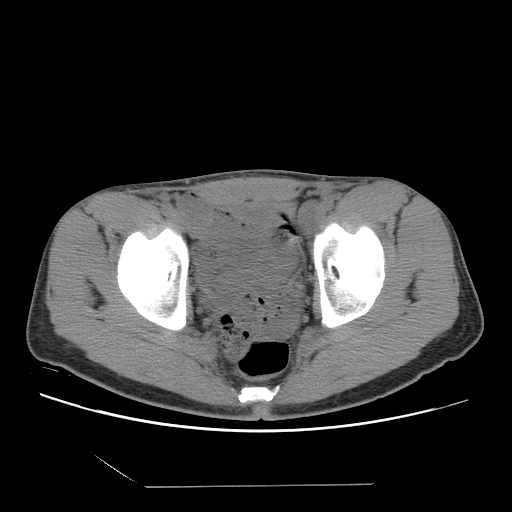
[im 32/94  soft-tissue]
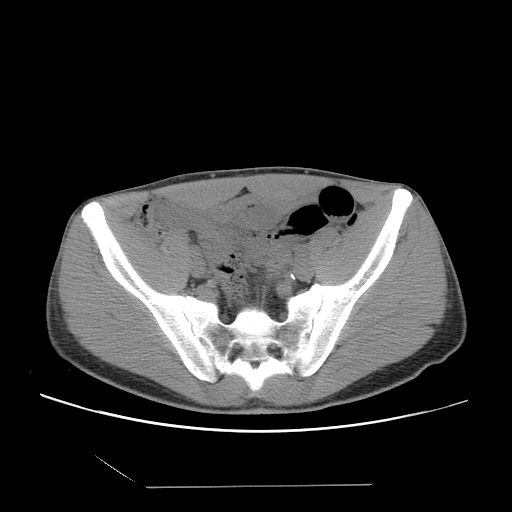
[im 42/94  soft-tissue]
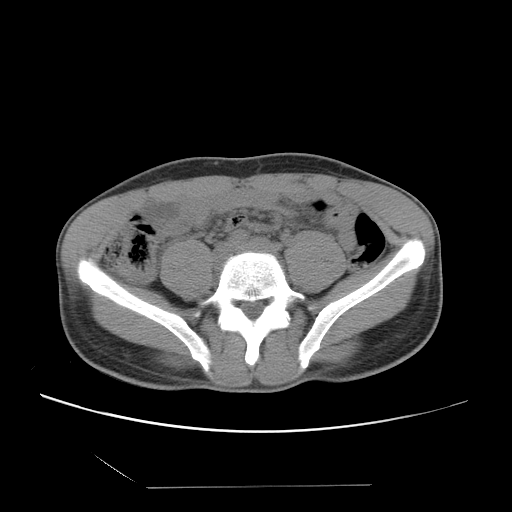
[im 52/94  soft-tissue]
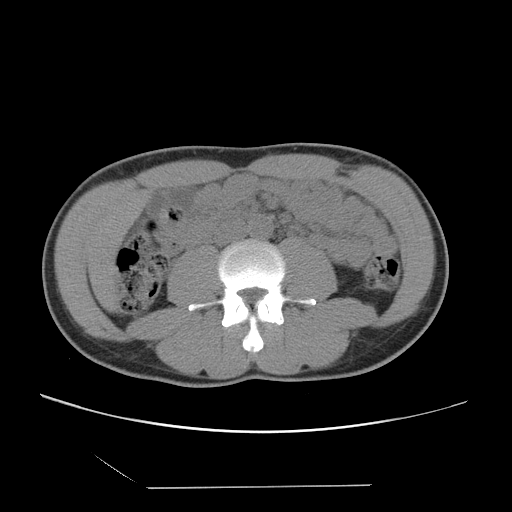
[im 52/94  lung]
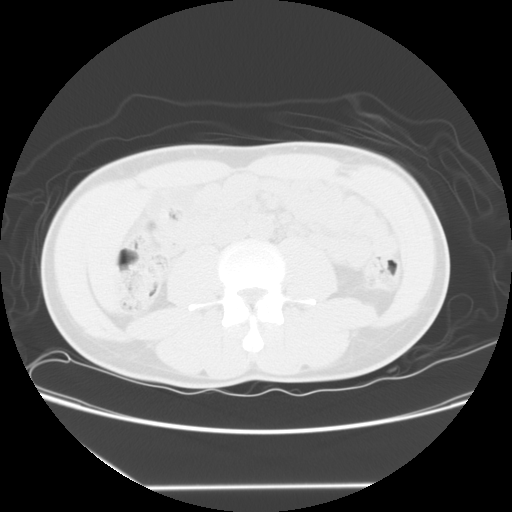
[im 63/94  soft-tissue]
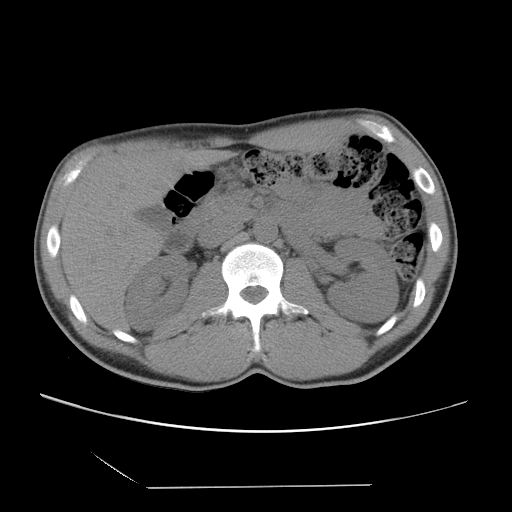
[im 63/94  lung]
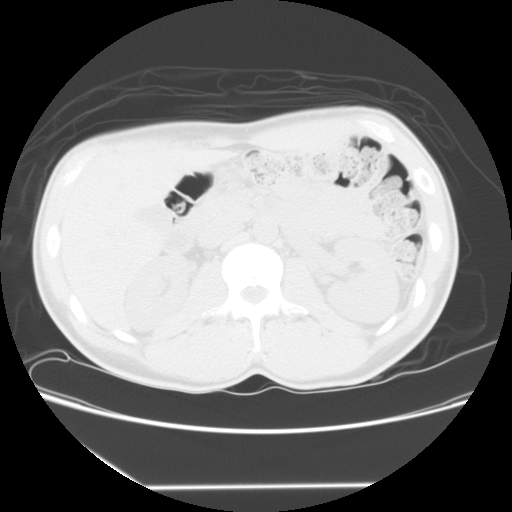
[im 73/94  soft-tissue]
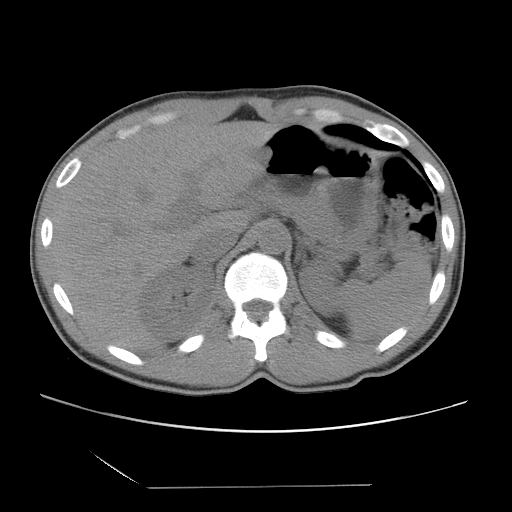
[im 73/94  lung]
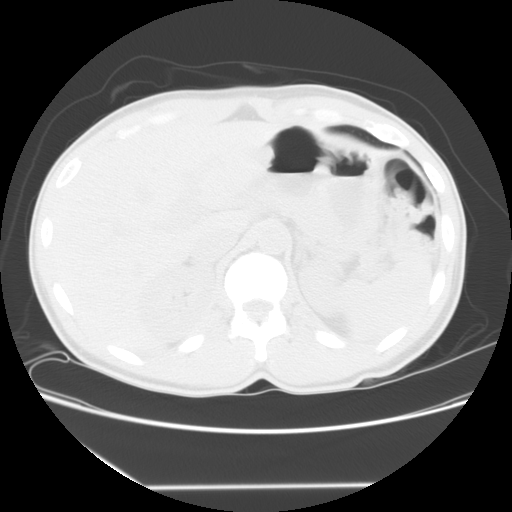
[im 83/94  soft-tissue]
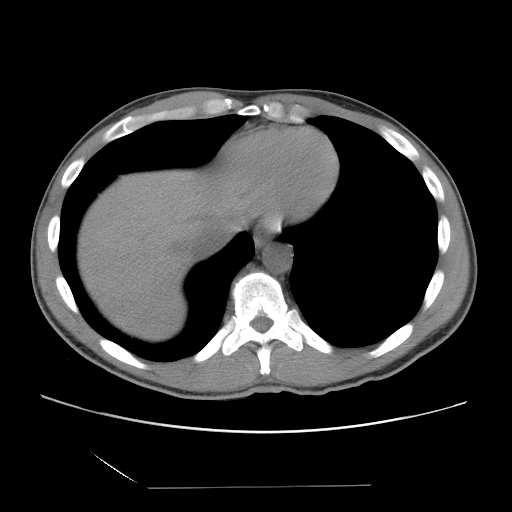
[im 83/94  lung]
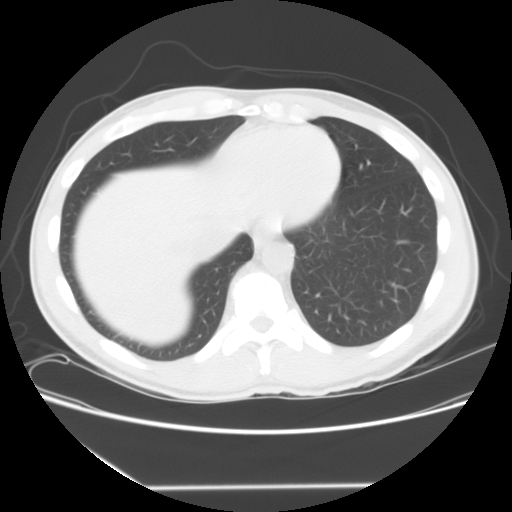

[Series 401: sag · sagittal · 0.93mm/px · 6 of 104 slices shown]
[im 10/104  soft-tissue]
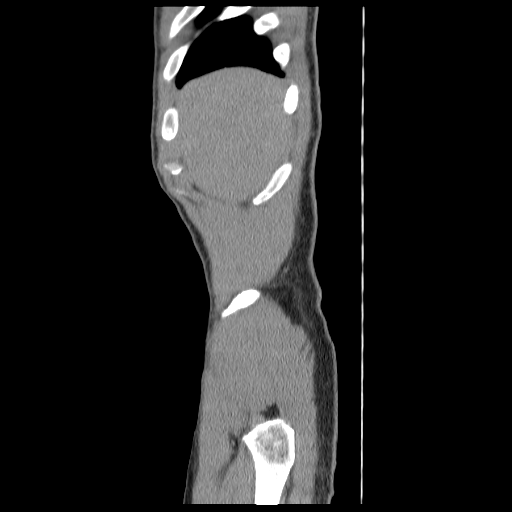
[im 19/104  soft-tissue]
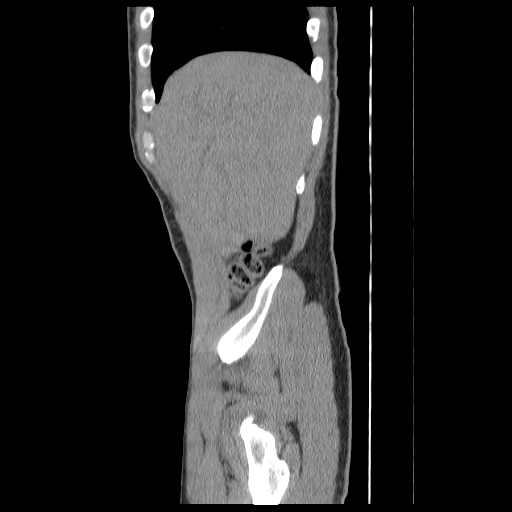
[im 38/104  soft-tissue]
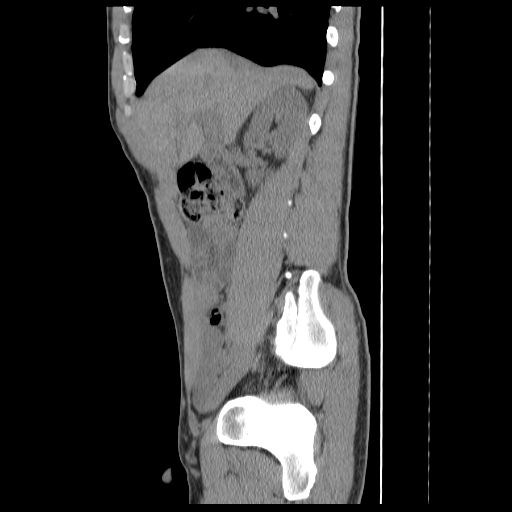
[im 47/104  soft-tissue]
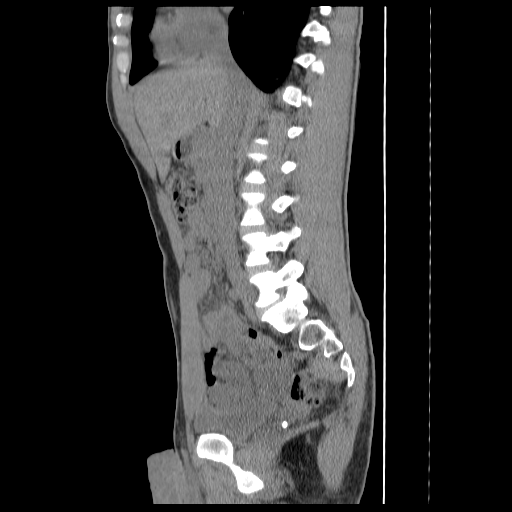
[im 57/104  soft-tissue]
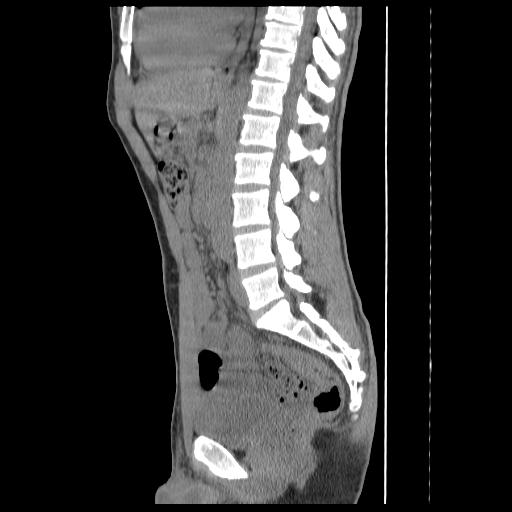
[im 66/104  soft-tissue]
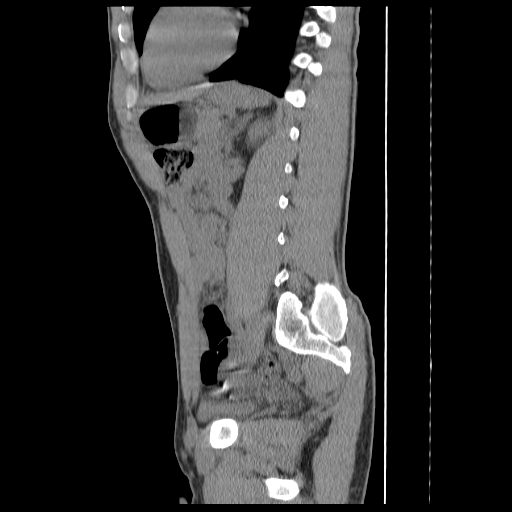

[14 of 32 positions shown; findings below may reference images not displayed]

FINDINGS: Clear lung bases. Unenhanced CT was performed per
clinician order.  Lack of IV contrast limits sensitivity and
specificity, especially for evaluation of abdominal/pelvic solid
viscera.  Liver grossly normal.  Gallbladder appears normal.  No
calcified gallstones.  Common bile duct

Grossly appears within normal limits allowing for noncontrast
technique.  No renal calculi/urolithiasis.  Spleen appears normal.
Stomach and small bowel within normal limits allowing for
noncontrast technique.  The appendix is not identified.  Suboptimal
evaluation of hollow viscera secondary to decreased body fat and
noncontrast technique.  Urinary bladder appears within normal
limits.

Bilateral SI joint osteoarthritis.  Pubic symphysis appears normal.
Subchondral cystic changes are present in the anterior right
acetabulum indicating mild osteoarthritis.  No bony central canal
stenosis.  Lumbar spinal alignment is anatomic.  Vacuum disc L5-S1.
Shallow partially calcified disc bulge at L5-S1.
IMPRESSION: Negative CT urogram.

## 2013-12-19 ENCOUNTER — Encounter: Payer: Self-pay | Admitting: Family Medicine

## 2013-12-19 ENCOUNTER — Ambulatory Visit (INDEPENDENT_AMBULATORY_CARE_PROVIDER_SITE_OTHER): Payer: Self-pay | Admitting: Family Medicine

## 2013-12-19 VITALS — BP 124/81 | HR 90 | Temp 97.7°F | Wt 159.0 lb

## 2013-12-19 DIAGNOSIS — E1165 Type 2 diabetes mellitus with hyperglycemia: Secondary | ICD-10-CM

## 2013-12-19 DIAGNOSIS — E119 Type 2 diabetes mellitus without complications: Secondary | ICD-10-CM

## 2013-12-19 LAB — POCT GLYCOSYLATED HEMOGLOBIN (HGB A1C): Hemoglobin A1C: >14
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
## 2013-12-21 NOTE — Assessment & Plan Note (Signed)
A1c of 14. Patient runs out of medicine and has financial difficulties. Also has trouble remembering taking medicine. DOes not want to add any additional meds to 70/30 and aspirin combo at present. Patient is going to start back on 40 units in AM and 15 in PM. Reeducated about hypoglycemia. Follow up about a week after insulin restarted to further titrate with new PCP. ALso discussed ideas to help remember to take insulin such as alarm on phone.

## 2013-12-21 NOTE — Progress Notes (Signed)
  Garret Reddish, MD Phone: [phone]  Subjective:   Jeremiah [name] is a 48 y.o. year old very pleasant male patient who presents with the following:  DIABETES Type II Medications taking and tolerating-no, out of insulin for at leat a week, states runs out often at least once a month and has to wait to pick up or get his son to help him. He misses about 50% of doses even when he does have it of AM dose and 75% of PM dose.  Blood Sugars per patient- AM usually 220-250, PM before dinner 350-400  Postponed the following until has insurance/orange card Health Maintenance Due  Topic Date Due  . Pneumococcal Polysaccharide Vaccine (##1) 06/19/1967  . Tetanus/tdap  06/18/1984  . Ophthalmology Exam  11/23/2013  On Aspirin-yes On statin-no, cannot afford at present Daily foot monitoring-yes  ROS- Endorses Polyuria,Polydipsia, nocturia. Denies recent weight loss. Denies Vision changes, feet or hand numbness/pain/tingling. Denies  Hypoglycemia symptoms (shaky, sweaty, hungry, weak anxious, tremor, palpitations, confusion, behavior change).   Hemoglobin a1c:  Lab Results  Component Value Date   HGBA1C >14.0 12/19/2013   HGBA1C 13.1 07/23/2013   HGBA1C >14.0 11/08/2012   Past Medical History- Patient Active Problem List   Diagnosis Date Noted  . Pain in joint, ankle and foot 11/08/2012    Priority: High  . Poorly controlled type 2 diabetes mellitus     Priority: High  . Background diabetic retinopathy, with macular edema, with moderate nonproliferative retinopathy, associated with type 2 diabetes mellitus 12/15/2012    Priority: Medium  . Diabetic peripheral neuropathy 12/03/2012    Priority: Medium  . Hypertension     Priority: Medium  . Right ear pain 01/30/2013    Priority: Low  . Shortness of breath 11/08/2012    Priority: Low  . Dental abscess 09/10/2013  . Hyperlipidemia 07/25/2013   Medications- reviewed and updated Current Outpatient Prescriptions  Medication Sig  Dispense Refill  . aspirin 81 MG EC tablet Take 81 mg by mouth daily. Swallow whole.      . insulin NPH-regular (NOVOLIN 70/30) (70-30) 100 UNIT/ML injection Inject 20-30 Units into the skin 2 (two) times daily with a meal. Take 30 units before breakfast and 20 units before dinner.       No current facility-administered medications for this visit.    Objective: BP 124/81  Pulse 90  Temp(Src) 97.7 F (36.5 C) (Oral)  Wt 159 lb (72.122 kg) Gen: NAD, resting comfortably CV: RRR no murmurs rubs or gallops Lungs: CTAB no crackles, wheeze, rhonchi Ext: no edema Skin: warm, dry, no rash  Neuro: grossly normal, moves all extremities DM foot exam: normal except Patient without sensation to monofilament on soles of feet, able to feel on top of foot.   Lab Results  Component Value Date   HGBA1C >14.0 12/19/2013   Assessment/Plan:  Poorly controlled type 2 diabetes mellitus A1c of 14. Patient runs out of medicine and has financial difficulties. Also has trouble remembering taking medicine. DOes not want to add any additional meds to 70/30 and aspirin combo at present. Patient is going to start back on 40 units in AM and 15 in PM. Reeducated about hypoglycemia. Follow up about a week after insulin restarted to further titrate with new PCP. ALso discussed ideas to help remember to take insulin such as alarm on phone.

## 2014-01-03 ENCOUNTER — Ambulatory Visit: Payer: Self-pay | Admitting: Family Medicine

## 2014-05-25 ENCOUNTER — Encounter (HOSPITAL_COMMUNITY): Payer: Self-pay | Admitting: *Deleted

## 2014-05-25 ENCOUNTER — Emergency Department (HOSPITAL_COMMUNITY)
Admission: EM | Admit: 2014-05-25 | Discharge: 2014-05-25 | Disposition: A | Discharge status: Home / Self Care | Payer: Self-pay | Attending: Emergency Medicine | Admitting: Emergency Medicine

## 2014-05-25 DIAGNOSIS — Y998 Other external cause status: Secondary | ICD-10-CM | POA: Insufficient documentation

## 2014-05-25 DIAGNOSIS — Y9289 Other specified places as the place of occurrence of the external cause: Secondary | ICD-10-CM | POA: Insufficient documentation

## 2014-05-25 DIAGNOSIS — Y9389 Activity, other specified: Secondary | ICD-10-CM | POA: Insufficient documentation

## 2014-05-25 DIAGNOSIS — G8929 Other chronic pain: Secondary | ICD-10-CM | POA: Insufficient documentation

## 2014-05-25 DIAGNOSIS — T383X1A Poisoning by insulin and oral hypoglycemic [antidiabetic] drugs, accidental (unintentional), initial encounter: Secondary | ICD-10-CM | POA: Insufficient documentation

## 2014-05-25 DIAGNOSIS — M1990 Unspecified osteoarthritis, unspecified site: Secondary | ICD-10-CM | POA: Insufficient documentation

## 2014-05-25 DIAGNOSIS — Z794 Long term (current) use of insulin: Secondary | ICD-10-CM | POA: Insufficient documentation

## 2014-05-25 DIAGNOSIS — Z8781 Personal history of (healed) traumatic fracture: Secondary | ICD-10-CM | POA: Insufficient documentation

## 2014-05-25 DIAGNOSIS — I10 Essential (primary) hypertension: Secondary | ICD-10-CM | POA: Insufficient documentation

## 2014-05-25 DIAGNOSIS — E1040 Type 1 diabetes mellitus with diabetic neuropathy, unspecified: Secondary | ICD-10-CM | POA: Insufficient documentation

## 2014-05-25 DIAGNOSIS — Z7982 Long term (current) use of aspirin: Secondary | ICD-10-CM | POA: Insufficient documentation

## 2014-05-25 LAB — BASIC METABOLIC PANEL
Anion gap: 14 (ref 5–15)
BUN: 17 mg/dL (ref 6–23)
CO2: 23 mEq/L (ref 19–32)
Calcium: 8.7 mg/dL (ref 8.4–10.5)
Chloride: 101 mEq/L (ref 96–112)
Creatinine, Ser: 1.13 mg/dL (ref 0.50–1.35)
GFR calc Af Amer: 87 mL/min — ABNORMAL LOW (ref >90)
GFR calc non Af Amer: 75 mL/min — ABNORMAL LOW (ref >90)
Glucose, Bld: 328 mg/dL — ABNORMAL HIGH (ref 70–99)
Potassium: 3.7 mEq/L (ref 3.7–5.3)
Sodium: 138 mEq/L (ref 137–147)

## 2014-05-25 LAB — CBG MONITORING, ED
Glucose-Capillary: 381 mg/dL — ABNORMAL HIGH (ref 70–99)
Glucose-Capillary: 165 mg/dL — ABNORMAL HIGH (ref 70–99)
Glucose-Capillary: 134 mg/dL — ABNORMAL HIGH (ref 70–99)
Glucose-Capillary: 156 mg/dL — ABNORMAL HIGH (ref 70–99)

## 2014-05-25 MED ORDER — SODIUM CHLORIDE 0.9 % IV BOLUS (SEPSIS): 1000.0000 mL | Freq: Once | INTRAVENOUS | Status: DC

## 2014-05-25 NOTE — ED Notes (Signed)
Pt reports accidentally taking incorrect insulin this am, was suppose to take 40 units of novolin 70/30 but took 40 units of novolog instead at approx 1025. No acute distress noted at triage.
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
## 2014-05-25 NOTE — Discharge Instructions (Signed)
You were seen and evaluated in the ED today for your accidental insulin overdose. Your blood glucose was monitored and is now trending upward. You may follow-up with your primary care within the next 3-5 days for further evaluation and management. Please return to ED if this event occurs again or if you have abdominal pain, chest pain, shortness of breath or fevers

## 2014-06-06 ENCOUNTER — Ambulatory Visit (INDEPENDENT_AMBULATORY_CARE_PROVIDER_SITE_OTHER): Payer: Self-pay | Admitting: Family Medicine

## 2014-06-06 ENCOUNTER — Encounter: Payer: Self-pay | Admitting: Family Medicine

## 2014-06-06 VITALS — BP 136/68 | HR 97 | Temp 98.7°F | Wt 180.0 lb

## 2014-06-06 DIAGNOSIS — E1165 Type 2 diabetes mellitus with hyperglycemia: Secondary | ICD-10-CM

## 2014-06-06 DIAGNOSIS — E119 Type 2 diabetes mellitus without complications: Secondary | ICD-10-CM

## 2014-06-06 DIAGNOSIS — I10 Essential (primary) hypertension: Secondary | ICD-10-CM

## 2014-06-06 DIAGNOSIS — E1142 Type 2 diabetes mellitus with diabetic polyneuropathy: Secondary | ICD-10-CM

## 2014-06-06 DIAGNOSIS — G629 Polyneuropathy, unspecified: Secondary | ICD-10-CM

## 2014-06-06 DIAGNOSIS — E1342 Other specified diabetes mellitus with diabetic polyneuropathy: Secondary | ICD-10-CM

## 2014-06-06 LAB — GLUCOSE, CAPILLARY: Glucose-Capillary: 251 mg/dL — ABNORMAL HIGH (ref 70–99)

## 2014-06-06 LAB — POCT GLYCOSYLATED HEMOGLOBIN (HGB A1C): Hemoglobin A1C: 12.2

## 2014-06-06 MED ORDER — GABAPENTIN 100 MG PO CAPS: 100.0000 mg | ORAL_CAPSULE | Freq: Three times a day (TID) | ORAL | Status: DC

## 2014-06-06 MED ORDER — INSULIN ASPART 100 UNIT/ML ~~LOC~~ SOLN: 10.0000 [IU] | Freq: Three times a day (TID) | SUBCUTANEOUS | Status: DC

## 2014-06-06 MED ORDER — INSULIN GLARGINE 100 UNIT/ML ~~LOC~~ SOLN: 35.0000 [IU] | SUBCUTANEOUS | Status: DC

## 2014-06-06 NOTE — Patient Instructions (Signed)
Take 35 units of Lantus every morning.   Please increase your Lantus by 1 unit per day if your blood sugar is higher than 120.   Take Novolog 10 units prior to each meal - You can increase by 2 units if your blood sugar is > 200 prior to your meal. Take the Novolog for meals that are larger than the palm of your hand (3 times daily).   Follow up visit with Pharmacist clinic.   - Bring meter or log book with readings.

## 2014-06-07 NOTE — Progress Notes (Signed)
Patient ID: Jeremiah [name], male   DOB: 10-Nov-1965, 48 y.o.   MRN: [id number]  [name], MD Phone: [phone]  Jeremiah [name] is a 48 y.o. male who presents today for f/u.  DIABETES Disease Monitoring: Blood Sugar ranges-am cbgs in 250's, checks later in the day and are anywhere from 60-400 Polyuria/phagia/dipsia- has polydipsia     Optho: last saw a year ago for laser surgery, did not follow-up as advised Medications: Compliance- taking novolog 70/30 40 u in am and 10 u pm, sometimes does not take pm dose depending on what his cbgs are Hypoglycemic symptoms- yes, notes he gets wobbly and feels weak and has a left sided headache. Denies weakness of one half of body, vertigo, photophobia, and phonophobia. States all these symptoms go away with eating food and having his blood sugar increase.   Diabetic neuropathy: notes this has been an issue for a long time. His hands and feet are numb bilaterally. Worsened over the past 6 months. Was on gabapentin previously, though not in some time. Has not been taking anything for this pain.   HYPERTENSION Disease Monitoring Home BP Monitoring no Chest pain- no    Dyspnea- no Medications Compliance-  Not taking anything currently.  Edema- no   Patient is a nonsmoker.   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    Assessment/Plan: Please see individual problem list.  # Healthcare maintenance: A1c today  [name], MD [place] PGY-3

## 2014-06-07 NOTE — Assessment & Plan Note (Addendum)
Poorly controlled on current regimen with A1c of 12.2. Discussed with Dr Valentina Lucks who met with the patient to give DM teaching while in the office. Patient will be switched to lantus 35 u daily and will increase this dose 1 u daily if fasting >120. Also will start on novolog 10 u with meals and +2 u if cbg >200. Samples were given to the patient of these medications. He will return to pharmacy clinic in January to follow-up on this issue. I will see him after he follows up in pharmacy clinic.

## 2014-06-07 NOTE — Assessment & Plan Note (Signed)
Will restart gabapentin. Plan to titrate up as tolerated. Needs better control of DM to prevent this from worsening.

## 2014-06-07 NOTE — Assessment & Plan Note (Signed)
At goal on no medications. Need to consider starting on lisinopril for renal protective effect given DM, though patient with no insurance at this time. Will start once set up for the orange card.

## 2014-06-10 ENCOUNTER — Telehealth: Payer: Self-pay | Admitting: *Deleted

## 2014-06-10 NOTE — Telephone Encounter (Signed)
Could you attempt to call the patient again to inform him of this? Thanks.

## 2014-06-10 NOTE — Telephone Encounter (Signed)
Tried calling pt to inform him that PCP can't complete disability physical form.  Pt's voice mail box is full.  [facility] does not do disability physicals.  Pt would need to find a provider that will do a disability physical and a disability lawyer per Dr. Caryl Bis.  Forms placed up front for pick up.  Derl Barrow, RN

## 2014-06-10 NOTE — Telephone Encounter (Signed)
Pt is aware that he needs to contact Social Security or his attorney to make an appt with a disability MD. Jeremiah [name]

## 2014-06-13 ENCOUNTER — Ambulatory Visit (INDEPENDENT_AMBULATORY_CARE_PROVIDER_SITE_OTHER): Payer: Self-pay | Admitting: Pharmacist

## 2014-06-13 DIAGNOSIS — I10 Essential (primary) hypertension: Secondary | ICD-10-CM

## 2014-06-13 DIAGNOSIS — E1142 Type 2 diabetes mellitus with diabetic polyneuropathy: Secondary | ICD-10-CM

## 2014-06-13 DIAGNOSIS — G629 Polyneuropathy, unspecified: Secondary | ICD-10-CM

## 2014-06-13 DIAGNOSIS — E119 Type 2 diabetes mellitus without complications: Secondary | ICD-10-CM

## 2014-06-13 DIAGNOSIS — E1342 Other specified diabetes mellitus with diabetic polyneuropathy: Secondary | ICD-10-CM

## 2014-06-13 DIAGNOSIS — E1165 Type 2 diabetes mellitus with hyperglycemia: Secondary | ICD-10-CM

## 2014-06-13 MED ORDER — GABAPENTIN 100 MG PO CAPS: 100.0000 mg | ORAL_CAPSULE | Freq: Three times a day (TID) | ORAL | Status: DC

## 2014-06-13 NOTE — Assessment & Plan Note (Signed)
Diabetes currently with improved control after switch to lantus and novolog regimen.   Patient is satisfied with change to Lantus/Novolog regimen from 70/30.   Denies hypoglycemic events since decreasing Novolog to 5 units with meals and is able to verbalize appropriate hypoglycemia management plan.  Reports adherence with medication.   Decreased dose of basal insulin Lantus (insulin glargine) from 35 to 30 units daily. Increased dose of rapid insulin Novolog (insulin aspart) from 5 to 6 with each meal.   Follow up next visit in Pharmacy Clinic in January for further assessment and adjustment.  Next A1C anticipated in early 2016.  Written patient instructions provided.  Total time in face to face counseling 15 minutes.

## 2014-06-13 NOTE — Progress Notes (Signed)
S:    Patient arrives in good spirits, ambulating without assistance.   Presents for diabetes follow up Patient has taken his lantus and novolog regimen and states "I am doing great".  He reports having several low readings (<80) when using Lantus 35 units daily AND Novolog 10 units three times daily.  He decreased his Novolog to 5 units prior to large meals and denies low readings since that adjustment.   O:  Lab Results  Component Value Date   HGBA1C 12.2 06/06/2014     home fasting CBG readings of 80-150 with current dosing.   A/P: Diabetes currently with improved control after switch to lantus and novolog regimen.   Patient is satisfied with change to Lantus/Novolog regimen from 70/30.   Denies hypoglycemic events since decreasing Novolog to 5 units with meals and is able to verbalize appropriate hypoglycemia management plan.  Reports adherence with medication.   Decreased dose of basal insulin Lantus (insulin glargine) from 35 to 30 units daily. Increased dose of rapid insulin Novolog (insulin aspart) from 5 to 6 with each meal.   Follow up next visit in Pharmacy Clinic in January for further assessment and adjustment.  Next A1C anticipated in early 2016.  Written patient instructions provided.  Total time in face to face counseling 15 minutes.    Patient pending dental procedure.  Requested process for obtaining antibiotics for this after leaving the official part of the visit.   Deferred for follow up to PCP - [place].

## 2014-06-13 NOTE — Patient Instructions (Addendum)
Lantus 30 units in the morning   Novolog 6 units prior to meals   Restart gabapentin take 1 three times daily.   Next visit in January with Pharmacist clinic.

## 2014-06-16 ENCOUNTER — Encounter: Payer: Self-pay | Admitting: Pharmacist

## 2014-06-16 NOTE — Progress Notes (Signed)
Patient ID: Jeremiah [name], male   DOB: 11-03-1965, 48 y.o.   MRN: [id number] Reviewed: Agree with Dr. Graylin Shiver documentation and management.

## 2014-07-10 ENCOUNTER — Ambulatory Visit (INDEPENDENT_AMBULATORY_CARE_PROVIDER_SITE_OTHER): Payer: Self-pay | Admitting: Pharmacist

## 2014-07-10 ENCOUNTER — Encounter: Payer: Self-pay | Admitting: Family Medicine

## 2014-07-10 ENCOUNTER — Encounter: Payer: Self-pay | Admitting: Pharmacist

## 2014-07-10 ENCOUNTER — Ambulatory Visit (INDEPENDENT_AMBULATORY_CARE_PROVIDER_SITE_OTHER): Payer: Self-pay | Admitting: Family Medicine

## 2014-07-10 VITALS — BP 148/82 | HR 89 | Ht 71.0 in | Wt 196.0 lb

## 2014-07-10 VITALS — BP 148/82 | HR 89 | Temp 97.9°F | Ht 71.0 in | Wt 196.0 lb

## 2014-07-10 DIAGNOSIS — G629 Polyneuropathy, unspecified: Secondary | ICD-10-CM

## 2014-07-10 DIAGNOSIS — E1342 Other specified diabetes mellitus with diabetic polyneuropathy: Secondary | ICD-10-CM

## 2014-07-10 DIAGNOSIS — E1165 Type 2 diabetes mellitus with hyperglycemia: Secondary | ICD-10-CM

## 2014-07-10 DIAGNOSIS — K047 Periapical abscess without sinus: Secondary | ICD-10-CM

## 2014-07-10 DIAGNOSIS — E119 Type 2 diabetes mellitus without complications: Secondary | ICD-10-CM

## 2014-07-10 DIAGNOSIS — E1142 Type 2 diabetes mellitus with diabetic polyneuropathy: Secondary | ICD-10-CM

## 2014-07-10 DIAGNOSIS — I10 Essential (primary) hypertension: Secondary | ICD-10-CM

## 2014-07-10 MED ORDER — CLINDAMYCIN HCL 300 MG PO CAPS: 300.0000 mg | ORAL_CAPSULE | Freq: Three times a day (TID) | ORAL | Status: DC

## 2014-07-10 NOTE — Patient Instructions (Signed)
It was nice to see you today.  Please take the antibiotic as prescribed.  Follow up with your PCP soon.  Be sure to follow up with [facility] as well.   Take Care  Dr. Lacinda Axon

## 2014-07-10 NOTE — Progress Notes (Signed)
S:    Patient arrives in good spirits, ambulating with no assistance.    Presents for diabetes follow-up. Patient reports taking his Lantus 30 units in the AM and Novolog 5-6 units with breakfast and dinner. Patient reports "I am doing great since switching to the Lantus and Novolog!" Patient also reports using the bathroom less during the night.  Patient reports high readings in the morning from 200-250 and a few low readings of around 80-100. Patient reports highest reading over the last few weeks of aorund 350.   At end of visit patient requested an evaluation of his "infected tooth"  O:  . Lab Results  Component Value Date   HGBA1C 12.2 06/06/2014    home fasting CBG readings of 80-250 with current dosing.  A/P: Diabetes with suboptimal control with current dosing of Lantus 30 units once daily and Novolog 5-6 units BID.  Patient reports few hypoglycemic events and is able to verbalize appropriate hypoglycemia management.  Reports adherence with insulin regimens. Increased dose of rapid insulin Novolog to 6-8 units with breakfast and lunch and 8-10 units with dinner.  Instructed patient on using 10 units when eating big meals with carbohydrates (potatoes, pasta, rice, cereal, and bread) and start checking sugars midday and before bed. No change in Lantus dose at this time, to continue current dose of 30 units daily. Instructed patient to decrease amount of orange juice he is drinking during the week (usually drinks 2 large bottles/week).  Instructed patient to drink more water and switch to drinking diet pop instead of regular.  Last A1c on 06/06/14 was 12.2.  Next A1c in 1-2 months.   Neuropathy with continued pain in hands and feet.  Patient reports numbness in both hands and pain in both feet. Patient has gabapentin 100 mg PO TID prescribed but has not picked up prescription yet. Instructed patient to pick up medication if possible.  Dental pain and seen by Dr. Thersa Salt while in office.   Patient with infection in back tooth and prescribed clindamyin 300 mg PO TID.   Written patient instructions provided.  Follow up with PCP this month.   Total time in face to face counseling 30 minutes.  Patient seen with Randell Patient, PharmD Candidate and Milus Glazier, PharmD Resident.  Marland Kitchen

## 2014-07-10 NOTE — Patient Instructions (Addendum)
It is nice to see you today.   Please check your blood sugar in the morning, at mid day, and at bed time   Please write down your blood sugar measurements and bring your readings with you to the next visit.   If you are eating any foods like potatoes, pasta, rice, cereals, breads may increase your mealtime novolog to 6-8 with early day meals and 8-10 units with the evening meal.   There is no change on the lantus dose, please continue to take 30 units every day.   Please check the labels of canned foods and some meats like bacon and sausage. Some of them are high in salt and are best avoided  It is best to avoid regular soda pop. Diet Dr. Malachi Bonds or sprite zero can be good alternatives.    When having lows drinking a quarter of glass of orange juice by itself or diluted with water, or regular soda will help.    Please return to office before the end of the month.  Please schedule a visit with your PCP.

## 2014-07-10 NOTE — Assessment & Plan Note (Signed)
Diabetes with suboptimal control with current dosing of Lantus 30 units once daily and Novolog 5-6 units BID.  Patient reports few hypoglycemic events and is able to verbalize appropriate hypoglycemia management.  Reports adherence with insulin regimens. Increased dose of rapid insulin Novolog to 6-8 units with breakfast and lunch and 8-10 units with dinner.  Instructed patient on using 10 units when eating big meals with carbohydrates (potatoes, pasta, rice, cereal, and bread) and start checking sugars midday and before bed. No change in Lantus dose at this time, to continue current dose of 30 units daily. Instructed patient to decrease amount of orange juice he is drinking during the week (usually drinks 2 large bottles/week).  Instructed patient to drink more water and switch to drinking diet pop instead of regular.  Last A1c on 06/06/14 was 12.2.  Next A1c in 1-2 months.

## 2014-07-10 NOTE — Progress Notes (Signed)
Patient ID: Jeremiah [name], male   DOB: 1966-01-15, 49 y.o.   MRN: [id number] Reviewed: Agree with Dr. Graylin Shiver documentation and management.

## 2014-07-10 NOTE — Addendum Note (Signed)
Addended by: Coral Spikes on: 07/10/2014 04:14 PM   Modules accepted: Orders

## 2014-07-10 NOTE — Progress Notes (Signed)
   Subjective:    Patient ID: Jeremiah [name], male    DOB: 1965/11/17, 49 y.o.   MRN: [id number]  HPI 50 year old male presents with complaints of dental pain.  Patient was seen by Dr. Valentina Lucks earlier and endorsed dental pain, prompting me to see him today.  1) Dental Pain  Patient reports that he has had tooth discomfort for the past few months.  Patient has not seen a dentist in years as he has no insurance and is currently unemployed.  He reports the pain is left-sided particularly his left upper molar. He reports some associated swelling of his left cheek.   No recent fevers, chills.   No recent nausea/vomiting.  Good PO intake although it is painful.  Review of Systems Per HPI    Objective:   Physical Exam  HENT:  Mouth/Throat:    Numerous caries noted. Highlighted tooth eroded with surrounding erythema.  No abscess noted.       Assessment & Plan:  See Problem List

## 2014-07-10 NOTE — Assessment & Plan Note (Signed)
No focal abscess noted. However there is surrounding erythema and the tooth is essentially eroded. Will treat with clindamycin.  Patient needs to be seen soon by a dentist for extraction.

## 2014-07-24 ENCOUNTER — Ambulatory Visit: Payer: Self-pay | Admitting: Family Medicine

## 2014-07-29 ENCOUNTER — Encounter: Payer: Self-pay | Admitting: Family Medicine

## 2014-07-29 ENCOUNTER — Ambulatory Visit (INDEPENDENT_AMBULATORY_CARE_PROVIDER_SITE_OTHER): Payer: Self-pay | Admitting: Family Medicine

## 2014-07-29 VITALS — BP 156/94 | HR 87 | Temp 98.4°F | Ht 71.0 in | Wt 191.0 lb

## 2014-07-29 DIAGNOSIS — G629 Polyneuropathy, unspecified: Secondary | ICD-10-CM

## 2014-07-29 DIAGNOSIS — E119 Type 2 diabetes mellitus without complications: Secondary | ICD-10-CM

## 2014-07-29 DIAGNOSIS — K047 Periapical abscess without sinus: Secondary | ICD-10-CM

## 2014-07-29 DIAGNOSIS — I10 Essential (primary) hypertension: Secondary | ICD-10-CM

## 2014-07-29 DIAGNOSIS — E1342 Other specified diabetes mellitus with diabetic polyneuropathy: Secondary | ICD-10-CM

## 2014-07-29 DIAGNOSIS — E1142 Type 2 diabetes mellitus with diabetic polyneuropathy: Secondary | ICD-10-CM

## 2014-07-29 DIAGNOSIS — E1165 Type 2 diabetes mellitus with hyperglycemia: Secondary | ICD-10-CM

## 2014-07-29 LAB — BASIC METABOLIC PANEL
BUN: 21 mg/dL (ref 6–23)
CO2: 29 mEq/L (ref 19–32)
Calcium: 8.3 mg/dL — ABNORMAL LOW (ref 8.4–10.5)
Chloride: 107 mEq/L (ref 96–112)
Creat: 1.29 mg/dL (ref 0.50–1.35)
Glucose, Bld: 248 mg/dL — ABNORMAL HIGH (ref 70–99)
Potassium: 4.1 mEq/L (ref 3.5–5.3)
Sodium: 141 mEq/L (ref 135–145)

## 2014-07-29 MED ORDER — GABAPENTIN 100 MG PO CAPS: 100.0000 mg | ORAL_CAPSULE | Freq: Three times a day (TID) | ORAL | Status: DC

## 2014-07-29 MED ORDER — PENICILLIN V POTASSIUM 500 MG PO TABS: 500.0000 mg | ORAL_TABLET | Freq: Four times a day (QID) | ORAL | Status: DC

## 2014-07-29 MED ORDER — LISINOPRIL 10 MG PO TABS: 10.0000 mg | ORAL_TABLET | Freq: Every day | ORAL | Status: DC

## 2014-07-29 NOTE — Patient Instructions (Signed)
Nice to see you. Please start taking lisinopril for your blood pressure.  Please take novolog 10 units with any big meal during the day. Otherwise take novolog 6-8 units with meals. Keep your lantus dose the same. If you consistently get glucose readings greater than 200 with this regimen please call us.  We will see you back in one week to check on your blood pressure.

## 2014-07-30 ENCOUNTER — Encounter: Payer: Self-pay | Admitting: Family Medicine

## 2014-07-30 NOTE — Assessment & Plan Note (Signed)
Some normal appearing cbgs. Patient did not increase novolog as advised by Dr Valentina Lucks at last visit. Will increase novolog to 10 u with large meals that include a large number of carbohydrates. Increase normal meal novolog to 6-8 u. Continue lantus 30 u daily. Advised to call if consistently below 100 or greater than 200.

## 2014-07-30 NOTE — Assessment & Plan Note (Signed)
Will resend gabapentin. Is to start this now and will continue to follow.

## 2014-07-30 NOTE — Assessment & Plan Note (Signed)
Not on medication at this time. Will start on lisinopril 10 mg daily. BMET today and in one week.

## 2014-07-30 NOTE — Progress Notes (Signed)
Patient ID: Jeremiah [name], male   DOB: 07-21-1965, 49 y.o.   MRN: [id number]  [name], MD Phone: [phone]  Jeremiah [name] is a 49 y.o. male who presents today for f/u.  DIABETES Disease Monitoring: Blood Sugar ranges-lunch 88-461, Dinner [id number] Polyuria/phagia/dipsia- some polydipsia    Medications: Compliance- taking novolog 5-6 u with normal meals and 10 u with large meal, though notes not using 10 u that frequently. Lantus 30 u Hypoglycemic symptoms- once a week. Drinks something sugary and feels better. Has cut out soda and decreased OJ intake. Eating all 3 meals daily.  HYPERTENSION Disease Monitoring Home BP Monitoring not checking Chest pain- no    Dyspnea- no Medications Compliance-  Not on medication at this time.  Edema- no  Neuropathic pain: patient with pin and needle sensation in bilateral feet and ankles. Notes it becomes more painful if he stands on his feet for more than 10 minutes. He was unable to get the gabapentin due to financial difficulties.   Tooth infection: notes he was prescribed clindamycin for this previously though could not afford it. He has pain and notes that his left lower posterior molar has been destroyed essentially. No fevers with this. He is unable to see a dentist until he gets the orange card and can get on the emergency dental list.    Patient is a nonsmoker.   ROS: Per HPI   Physical Exam Filed Vitals:   07/29/14 1013  BP: 156/94  Pulse: 87  Temp: 98.4 F (36.9 C)    Gen: Well NAD HEENT: PERRL,  MMM, left posterior lower molar destroyed with only base of tooth apparent, there is no surrounding erythema or drainage from the area, there is some tenderness, there is no swelling around the tooth or in the cheek Lungs: CTABL Nl WOB Heart: RRR  Bilateral feet with good sensation to light touch Exts: Non edematous BL  LE, warm and well perfused.    Assessment/Plan: Please see individual problem list.  [name], MD [place] PGY-3

## 2014-07-30 NOTE — Assessment & Plan Note (Signed)
Has not taken antibiotics due to cost. Will send in for penicillin VK for 7 days. This is on $4 list at [facility]. Given return precautions. Will need to see dentist once he can get on to emergency dental list.

## 2014-08-06 ENCOUNTER — Ambulatory Visit: Payer: Self-pay | Admitting: Family Medicine

## 2014-08-08 ENCOUNTER — Encounter: Payer: Self-pay | Admitting: Family Medicine

## 2014-08-12 ENCOUNTER — Telehealth: Payer: Self-pay | Admitting: Family Medicine

## 2014-08-12 ENCOUNTER — Ambulatory Visit: Payer: Self-pay | Admitting: Family Medicine

## 2014-08-12 DIAGNOSIS — K047 Periapical abscess without sinus: Secondary | ICD-10-CM

## 2014-08-12 NOTE — Telephone Encounter (Signed)
Referral placed.

## 2014-08-12 NOTE — Telephone Encounter (Signed)
Patient requests a Dental referral. Please, call when it is ready.

## 2014-08-12 NOTE — Telephone Encounter (Signed)
Will forward to MD. Jeremiah [name],CMA  

## 2014-08-21 ENCOUNTER — Inpatient Hospital Stay (HOSPITAL_COMMUNITY): Payer: Self-pay

## 2014-08-21 ENCOUNTER — Ambulatory Visit (INDEPENDENT_AMBULATORY_CARE_PROVIDER_SITE_OTHER): Payer: Self-pay | Admitting: Family Medicine

## 2014-08-21 ENCOUNTER — Inpatient Hospital Stay (HOSPITAL_COMMUNITY)
Admission: AD | Admit: 2014-08-21 | Discharge: 2014-08-22 | DRG: 125 | Disposition: A | Discharge status: Home / Self Care | Payer: Self-pay | Source: Ambulatory Visit | Attending: Family Medicine | Admitting: Family Medicine

## 2014-08-21 ENCOUNTER — Encounter (HOSPITAL_COMMUNITY): Payer: Self-pay

## 2014-08-21 ENCOUNTER — Encounter: Payer: Self-pay | Admitting: Family Medicine

## 2014-08-21 VITALS — BP 149/86 | HR 85 | Temp 98.3°F | Ht 71.0 in | Wt 198.0 lb

## 2014-08-21 DIAGNOSIS — Z794 Long term (current) use of insulin: Secondary | ICD-10-CM

## 2014-08-21 DIAGNOSIS — Z79899 Other long term (current) drug therapy: Secondary | ICD-10-CM

## 2014-08-21 DIAGNOSIS — G5623 Lesion of ulnar nerve, bilateral upper limbs: Secondary | ICD-10-CM

## 2014-08-21 DIAGNOSIS — G5622 Lesion of ulnar nerve, left upper limb: Secondary | ICD-10-CM

## 2014-08-21 DIAGNOSIS — Z23 Encounter for immunization: Secondary | ICD-10-CM

## 2014-08-21 DIAGNOSIS — H532 Diplopia: Secondary | ICD-10-CM | POA: Diagnosis present

## 2014-08-21 DIAGNOSIS — I639 Cerebral infarction, unspecified: Secondary | ICD-10-CM | POA: Diagnosis present

## 2014-08-21 DIAGNOSIS — E119 Type 2 diabetes mellitus without complications: Secondary | ICD-10-CM | POA: Insufficient documentation

## 2014-08-21 DIAGNOSIS — H5340 Unspecified visual field defects: Secondary | ICD-10-CM | POA: Diagnosis present

## 2014-08-21 DIAGNOSIS — E785 Hyperlipidemia, unspecified: Secondary | ICD-10-CM | POA: Diagnosis present

## 2014-08-21 DIAGNOSIS — I10 Essential (primary) hypertension: Secondary | ICD-10-CM

## 2014-08-21 DIAGNOSIS — E1165 Type 2 diabetes mellitus with hyperglycemia: Secondary | ICD-10-CM | POA: Diagnosis present

## 2014-08-21 DIAGNOSIS — Z7982 Long term (current) use of aspirin: Secondary | ICD-10-CM

## 2014-08-21 DIAGNOSIS — E118 Type 2 diabetes mellitus with unspecified complications: Secondary | ICD-10-CM

## 2014-08-21 DIAGNOSIS — M1990 Unspecified osteoarthritis, unspecified site: Secondary | ICD-10-CM | POA: Diagnosis present

## 2014-08-21 DIAGNOSIS — G8929 Other chronic pain: Secondary | ICD-10-CM | POA: Diagnosis present

## 2014-08-21 DIAGNOSIS — H539 Unspecified visual disturbance: Secondary | ICD-10-CM

## 2014-08-21 DIAGNOSIS — G5621 Lesion of ulnar nerve, right upper limb: Secondary | ICD-10-CM

## 2014-08-21 DIAGNOSIS — E11311 Type 2 diabetes mellitus with unspecified diabetic retinopathy with macular edema: Principal | ICD-10-CM | POA: Diagnosis present

## 2014-08-21 DIAGNOSIS — E1140 Type 2 diabetes mellitus with diabetic neuropathy, unspecified: Secondary | ICD-10-CM | POA: Diagnosis present

## 2014-08-21 LAB — BASIC METABOLIC PANEL
Anion gap: 6 (ref 5–15)
BUN: 19 mg/dL (ref 6–23)
CO2: 30 mmol/L (ref 19–32)
Calcium: 8.9 mg/dL (ref 8.4–10.5)
Chloride: 101 mmol/L (ref 96–112)
Creatinine, Ser: 1.35 mg/dL (ref 0.50–1.35)
GFR calc Af Amer: 70 mL/min — ABNORMAL LOW (ref >90)
GFR calc non Af Amer: 60 mL/min — ABNORMAL LOW (ref >90)
Glucose, Bld: 208 mg/dL — ABNORMAL HIGH (ref 70–99)
Potassium: 4 mmol/L (ref 3.5–5.1)
Sodium: 137 mmol/L (ref 135–145)

## 2014-08-21 LAB — CBC
HCT: 35.2 % — ABNORMAL LOW (ref 39.0–52.0)
Hemoglobin: 11.4 g/dL — ABNORMAL LOW (ref 13.0–17.0)
MCH: 23.5 pg — ABNORMAL LOW (ref 26.0–34.0)
MCHC: 32.4 g/dL (ref 30.0–36.0)
MCV: 72.4 fL — ABNORMAL LOW (ref 78.0–100.0)
Platelets: 253 10*3/uL (ref 150–400)
RBC: 4.86 MIL/uL (ref 4.22–5.81)
RDW: 13.8 % (ref 11.5–15.5)
WBC: 4.4 10*3/uL (ref 4.0–10.5)

## 2014-08-21 LAB — GLUCOSE, CAPILLARY
Glucose-Capillary: 290 mg/dL — ABNORMAL HIGH (ref 70–99)
Glucose-Capillary: 198 mg/dL — ABNORMAL HIGH (ref 70–99)

## 2014-08-21 MED ORDER — LISINOPRIL 10 MG PO TABS
10.0000 mg | ORAL_TABLET | Freq: Every day | ORAL | Status: DC
  Administered 2014-08-22: 10 mg via ORAL
  Filled 2014-08-21: qty 1

## 2014-08-21 MED ORDER — GABAPENTIN 100 MG PO CAPS
100.0000 mg | ORAL_CAPSULE | Freq: Three times a day (TID) | ORAL | Status: DC
  Administered 2014-08-21 – 2014-08-22 (×4): 100 mg via ORAL
  Filled 2014-08-21 (×4): qty 1

## 2014-08-21 MED ORDER — INSULIN GLARGINE 100 UNIT/ML ~~LOC~~ SOLN
20.0000 [IU] | SUBCUTANEOUS | Status: DC
  Administered 2014-08-22: 20 [IU] via SUBCUTANEOUS
  Filled 2014-08-21 (×2): qty 0.2

## 2014-08-21 MED ORDER — INSULIN ASPART 100 UNIT/ML ~~LOC~~ SOLN: 0.0000 [IU] | Freq: Every day | SUBCUTANEOUS | Status: DC

## 2014-08-21 MED ORDER — STROKE: EARLY STAGES OF RECOVERY BOOK
Freq: Once | Status: AC
  Administered 2014-08-21: 14:00:00
  Filled 2014-08-21: qty 1

## 2014-08-21 MED ORDER — INSULIN ASPART 100 UNIT/ML ~~LOC~~ SOLN
0.0000 [IU] | Freq: Three times a day (TID) | SUBCUTANEOUS | Status: DC
  Administered 2014-08-21: 8 [IU] via SUBCUTANEOUS
  Administered 2014-08-22: 11 [IU] via SUBCUTANEOUS

## 2014-08-21 MED ORDER — ATORVASTATIN CALCIUM 80 MG PO TABS
80.0000 mg | ORAL_TABLET | Freq: Every day | ORAL | Status: DC
  Administered 2014-08-21: 80 mg via ORAL
  Filled 2014-08-21: qty 1

## 2014-08-21 MED ORDER — INFLUENZA VAC SPLIT QUAD 0.5 ML IM SUSY
0.5000 mL | PREFILLED_SYRINGE | INTRAMUSCULAR | Status: AC
  Administered 2014-08-22: 0.5 mL via INTRAMUSCULAR
  Filled 2014-08-21 (×2): qty 0.5

## 2014-08-21 MED ORDER — PNEUMOCOCCAL VAC POLYVALENT 25 MCG/0.5ML IJ INJ
0.5000 mL | INJECTION | INTRAMUSCULAR | Status: AC
  Administered 2014-08-22: 0.5 mL via INTRAMUSCULAR
  Filled 2014-08-21: qty 0.5

## 2014-08-21 MED ORDER — ASPIRIN EC 81 MG PO TBEC
81.0000 mg | DELAYED_RELEASE_TABLET | Freq: Every day | ORAL | Status: DC
  Administered 2014-08-22: 81 mg via ORAL
  Filled 2014-08-21: qty 1

## 2014-08-21 MED ORDER — HEPARIN SODIUM (PORCINE) 5000 UNIT/ML IJ SOLN
5000.0000 [IU] | Freq: Three times a day (TID) | INTRAMUSCULAR | Status: DC
  Administered 2014-08-21 – 2014-08-22 (×4): 5000 [IU] via SUBCUTANEOUS
  Filled 2014-08-21 (×3): qty 1
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
## 2014-08-21 NOTE — Progress Notes (Signed)
Pt arrived to unit at 13:49pm alert, verbal via wheelchair with no noted distress. Pt stable, neuro intact. Able to follow commands. Pt oriented to room. Safety measures in place. Call bell within reach. Will continue to monitor.

## 2014-08-22 DIAGNOSIS — H539 Unspecified visual disturbance: Secondary | ICD-10-CM | POA: Insufficient documentation

## 2014-08-22 DIAGNOSIS — I6789 Other cerebrovascular disease: Secondary | ICD-10-CM

## 2014-08-22 DIAGNOSIS — G5623 Lesion of ulnar nerve, bilateral upper limbs: Secondary | ICD-10-CM | POA: Insufficient documentation

## 2014-08-22 LAB — GLUCOSE, CAPILLARY
Glucose-Capillary: 168 mg/dL — ABNORMAL HIGH (ref 70–99)
Glucose-Capillary: 316 mg/dL — ABNORMAL HIGH (ref 70–99)

## 2014-08-22 LAB — LIPID PANEL
Cholesterol: 233 mg/dL — ABNORMAL HIGH (ref 0–200)
HDL: 46 mg/dL (ref >39)
LDL Cholesterol: 173 mg/dL — ABNORMAL HIGH (ref 0–99)
Total CHOL/HDL Ratio: 5.1 RATIO
Triglycerides: 68 mg/dL (ref <150)
VLDL: 14 mg/dL (ref 0–40)

## 2014-08-22 MED ORDER — PRAVASTATIN SODIUM 40 MG PO TABS: 40.0000 mg | ORAL_TABLET | Freq: Every day | ORAL | Status: DC

## 2014-08-22 NOTE — Progress Notes (Signed)
  Echocardiogram 2D Echocardiogram has been performed.  Jeremiah [name] 08/22/2014, 9:22 AM

## 2014-08-22 NOTE — Evaluation (Signed)
Physical Therapy Evaluation Patient Details Name: Jeremiah [name] MRN: [id number] DOB: 1965-11-10 Today's Date: 08/22/2014   History of Present Illness  Adm for vision changes including diplopia and field cut. MRI brain negative; likely due to hyperglycemia per MD note. PMHx-DM, perifpheral neuropathy, retinopathy    Clinical Impression  Patient evaluated by Physical Therapy with no further PT needs identified. During interview, pt denied falls however has significant losses of balance and "catches" himself. Pt with bil foot neuropathy. Assessed pt in his shoes and noted his vestibular system compensates well for his lack of sensory input via feet when his vision is occluded. Did well with all balance testing except for single leg stance. Pt reports he cannot maintain position due to foot pain (when all his weight is on one foot). Discussed importance of controlling his diabetes and proper lighting to decr his risk of falls. All education has been completed and the patient has no further questions. PT is signing off. Thank you for this referral.     Follow Up Recommendations No PT follow up    Equipment Recommendations  None recommended by PT    Recommendations for Other Services       Precautions / Restrictions        Mobility  Bed Mobility Overal bed mobility: Independent                Transfers Overall transfer level: Independent                  Ambulation/Gait Ambulation/Gait assistance: Independent Ambulation Distance (Feet): 80 Feet Assistive device: None Gait Pattern/deviations: WFL(Within Functional Limits)        Stairs            Wheelchair Mobility    Modified Rankin (Stroke Patients Only)       Balance Overall balance assessment: Needs assistance         Standing balance support: No upper extremity supported;During functional activity Standing balance-Leahy Scale: Normal   Single Leg Stance - Right Leg: 3 Single Leg  Stance - Left Leg: 3     Rhomberg - Eyes Opened: 30 Rhomberg - Eyes Closed: 45 High level balance activites: Backward walking;Direction changes;Turns;Sudden stops;Head turns               Pertinent Vitals/Pain Pain Assessment: 0-10 Pain Score: 5  Pain Location: bil feet Pain Intervention(s): Limited activity within patient's tolerance;Monitored during session    [place] expects to be discharged to:: Private residence Living Arrangements: Spouse/significant other   Type of Home: House                Prior Function Level of Independence: Independent         Comments: pt reports unsteady on his feet but denies falls; "stagger and catch myself"     Hand Dominance   Dominant Hand: Left    Extremity/Trunk Assessment   Upper Extremity Assessment: Overall WFL for tasks assessed           Lower Extremity Assessment: Overall WFL for tasks assessed      Cervical / Trunk Assessment: Normal  Communication   Communication: No difficulties  Cognition Arousal/Alertness: Awake/alert Behavior During Therapy: WFL for tasks assessed/performed Overall Cognitive Status: Within Functional Limits for tasks assessed                      General Comments      Exercises        Assessment/Plan  PT Assessment Patent does not need any further PT services  PT Diagnosis Difficulty walking   PT Problem List    PT Treatment Interventions     PT Goals (Current goals can be found in the Care Plan section) Acute Rehab PT Goals PT Goal Formulation: All assessment and education complete, DC therapy    Frequency     Barriers to discharge        Co-evaluation               End of Session   Activity Tolerance: Patient tolerated treatment well Patient left: in chair;with call bell/phone within reach           Time: 1355-1405 PT Time Calculation (min) (ACUTE ONLY): 10 min   Charges:   PT Evaluation $Initial PT Evaluation  Tier I: 1 Procedure     PT G Codes:        Jeremiah [name] 08-24-14, 2:16 PM Pager [phone]

## 2014-08-22 NOTE — Assessment & Plan Note (Signed)
Likely related to DM, though could be related to ulnar nerve compression. Could potentially benefit from EMG to further evaluate this vs increase in gabapentin. Will address after patient gets out of hospital.

## 2014-08-22 NOTE — Progress Notes (Signed)
Talked to patient about DCP; patient stated that he goes to the [facility] for Medical care ( PCP Dr Caryl Bis) and plans to follow up with him after discharge; Patient stated that he cannot afford his medication; patient qualifies for the [place] ( Medication Assistance Through [facility]) the patient can use this fund only once per year. The co pay with this medication assistance is $3.00 per prescription - patient stated that he can afford to pay his co pay; CM instructed patient to go to the [facility] for his medication at discharge; Aneta Mins [id number]

## 2014-08-22 NOTE — Progress Notes (Signed)
*  PRELIMINARY RESULTS* Vascular Ultrasound Carotid Duplex (Doppler) has been completed.   Findings suggest 1-39% internal carotid artery stenosis bilaterally. Vertebral arteries are patent with antegrade flow.  08/22/2014 9:49 AM Maudry Mayhew, RVT, RDCS, RDMS
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
## 2014-08-22 NOTE — Discharge Instructions (Signed)
We feel as though the symptoms you were experiencing which prompted your admission into the hospital was likely caused by high blood sugar levels (aka: hyperglycemia). We encourage you to continue taking your home medications every day as prescribed. Also, we have found that by adding a new medication to your daily medications we can help reduce the risk of stroke in the future. These is the "Pravastatin" medication we placed you on. Please make sure to keep the appointment you have with Dr. Caryl Bis on 09/01/14.   Blood Glucose Monitoring Monitoring your blood glucose (also know as blood sugar) helps you to manage your diabetes. It also helps you and your health care provider monitor your diabetes and determine how well your treatment plan is working. WHY SHOULD YOU MONITOR YOUR BLOOD GLUCOSE?  It can help you understand how food, exercise, and medicine affect your blood glucose.  It allows you to know what your blood glucose is at any given moment. You can quickly tell if you are having low blood glucose (hypoglycemia) or high blood glucose (hyperglycemia).  It can help you and your health care provider know how to adjust your medicines.  It can help you understand how to manage an illness or adjust medicine for exercise. WHEN SHOULD YOU TEST? Your health care provider will help you decide how often you should check your blood glucose. This may depend on the type of diabetes you have, your diabetes control, or the types of medicines you are taking. Be sure to write down all of your blood glucose readings so that this information can be reviewed with your health care provider. See below for examples of testing times that your health care provider may suggest. Type 1 Diabetes  Test 4 times a day if you are in good control, using an insulin pump, or perform multiple daily injections.  If your diabetes is not well controlled or if you are sick, you may need to monitor more often.  It is a good  idea to also monitor:  Before and after exercise.  Between meals and 2 hours after a meal.  Occasionally between 2:00 a.m. and 3:00 a.m. Type 2 Diabetes  It can vary with each person, but generally, if you are on insulin, test 4 times a day.  If you take medicines by mouth (orally), test 2 times a day.  If you are on a controlled diet, test once a day.  If your diabetes is not well controlled or if you are sick, you may need to monitor more often. HOW TO MONITOR YOUR BLOOD GLUCOSE Supplies Needed  Blood glucose meter.  Test strips for your meter. Each meter has its own strips. You must use the strips that go with your own meter.  A pricking needle (lancet).  A device that holds the lancet (lancing device).  A journal or log book to write down your results. Procedure  Wash your hands with soap and water. Alcohol is not preferred.  Prick the side of your finger (not the tip) with the lancet.  Gently milk the finger until a small drop of blood appears.  Follow the instructions that come with your meter for inserting the test strip, applying blood to the strip, and using your blood glucose meter. Other Areas to Get Blood for Testing Some meters allow you to use other areas of your body (other than your finger) to test your blood. These areas are called alternative sites. The most common alternative sites are:  The forearm.  The thigh.  The back area of the lower leg.  The palm of the hand. The blood flow in these areas is slower. Therefore, the blood glucose values you get may be delayed, and the numbers are different from what you would get from your fingers. Do not use alternative sites if you think you are having hypoglycemia. Your reading will not be accurate. Always use a finger if you are having hypoglycemia. Also, if you cannot feel your lows (hypoglycemia unawareness), always use your fingers for your blood glucose checks. ADDITIONAL TIPS FOR GLUCOSE  MONITORING  Do not reuse lancets.  Always carry your supplies with you.  All blood glucose meters have a 24-hour "hotline" number to call if you have questions or need help.  Adjust (calibrate) your blood glucose meter with a control solution after finishing a few boxes of strips. BLOOD GLUCOSE RECORD KEEPING It is a good idea to keep a daily record or log of your blood glucose readings. Most glucose meters, if not all, keep your glucose records stored in the meter. Some meters come with the ability to download your records to your home computer. Keeping a record of your blood glucose readings is especially helpful if you are wanting to look for patterns. Make notes to go along with the blood glucose readings because you might forget what happened at that exact time. Keeping good records helps you and your health care provider to work together to achieve good diabetes management.  Document Released: 06/16/2003 Document Revised: 10/28/2013 Document Reviewed: 11/05/2012 [facility] Patient Information 2015 [place], [place]. This information is not intended to replace advice given to you by your health care provider. Make sure you discuss any questions you have with your health care provider.

## 2014-08-22 NOTE — Evaluation (Signed)
  Occupational Therapy Evaluation and Discharge Patient Details Name: Jeremiah [name] MRN: [id number] DOB: 15-Sep-1965 Today's Date: 09/17/2014    History of Present Illness Adm for vision changes including diplopia and field cut. MRI brain negative; likely due to hyperglycemia per MD note. PMHx-DM, perifpheral neuropathy, retinopathy   Clinical Impression   PTA pt lived at home and was independent with ADLs. Pt reports that his visual changes have resolved. Vision screen [facility]. Pt at Mod I to Independence with ADLs. No further acute OT needs at this time.     Follow Up Recommendations  No OT follow up    Equipment Recommendations  None recommended by OT    Recommendations for Other Services       Precautions / Restrictions Restrictions Weight Bearing Restrictions: No      Mobility Bed Mobility Overal bed mobility: Independent                Transfers Overall transfer level: Independent                         ADL Overall ADL's : Modified independent                                       General ADL Comments: Pt overall at Mod I level with ADLs.      Vision Vision Assessment?: No apparent visual deficits Additional Comments: Pt admitted with visual changes. He reports that he had double vision yesterday, but reports that his visual changes have resolved. Vision screen [facility]          Pertinent Vitals/Pain Pain Assessment: No/denies pain Pain Score: 5  Pain Location: bil feet Pain Intervention(s): Limited activity within patient's tolerance;Monitored during session     Hand Dominance Left   Extremity/Trunk Assessment Upper Extremity Assessment Upper Extremity Assessment: Overall WFL for tasks assessed   Lower Extremity Assessment Lower Extremity Assessment: Overall WFL for tasks assessed   Cervical / Trunk Assessment Cervical / Trunk Assessment: Normal   Communication Communication Communication: No difficulties    Cognition Arousal/Alertness: Awake/alert Behavior During Therapy: WFL for tasks assessed/performed Overall Cognitive Status: Within Functional Limits for tasks assessed                                Home Living Family/patient expects to be discharged to:: Private residence Living Arrangements: Spouse/significant other Available Help at Discharge: Family Type of Home: House       Home Layout: One level     Bathroom Shower/Tub: Tub/shower unit Shower/tub characteristics: Architectural technologist: Standard     Home Equipment: None          Prior Functioning/Environment Level of Independence: Independent        Comments: pt reports unsteady on his feet but denies falls; "stagger and catch myself"    OT Diagnosis: Disturbance of vision    End of Session  Activity Tolerance: Patient tolerated treatment well Patient left: with call bell/phone within reach;with family/visitor present;Other (comment) (sitting EOB with nursing)   Time: [id number] OT Time Calculation (min): 8 min Charges:  OT General Charges $OT Visit: 1 Procedure OT Evaluation $Initial OT Evaluation Tier I: 1 Procedure G-Codes:    Juluis Rainier 2014/09/17, 4:04 PM  Cyndie Chime, OTR/L Occupational Therapist [phone] (pager)

## 2014-08-22 NOTE — Progress Notes (Signed)
Reviewed discharge instructions with patient. Arrangements made through case management for medication assistance through the [facility] program. Patient verbalized understanding. Reviewed the importance of monitoring BG and adhering to medication regimen. He was transported to the lobby via wheelchair by [employer]; a friend provided transportation to his home. Discharged at 1630.

## 2014-08-22 NOTE — Assessment & Plan Note (Signed)
Not at goal. Patient being admitted for management of vision changes. Would favor increasing lisinopril while in hospital if primary team is ok with this.

## 2014-08-22 NOTE — Assessment & Plan Note (Signed)
Acute change in vision concerning for CVA vs mass lesion. Patient will be admitted for further testing and evaluation.

## 2014-08-22 NOTE — Discharge Summary (Signed)
[facility] Discharge Summary  Patient name: Jeremiah [name] Medical record number: [id number] Date of birth: [birth year] Age: 49 y.o. 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  Brief Hospital Course:  Patient is a 49 year old male who presented with vision changes and eye pain/pressure for the past 4-5 days. He reports that he is noticed a change in his vision as though his eyes are being pushed forward. Patient is described as blurry with occasional double vision. Otherwise he states he felt fine on admission, and was at his baseline w. Speech, weakness, dizziness, facial droop.  Patient was admitted. CT scan was obtained showed no acute intracranial changes, some evidence suggestive of right maxillary sinusitis. MRI/MRA was obtained which also showed no acute intracranial changes or arterial occlusions. Similar right maxillary sinus ingestion/sinusitis appreciated. Carotid Doppler benign. Echocardiogram obtained which showed no evidence of vegetations, EF of 50-55%. Grade 1 diastolic dysfunction. At the time of admission CBG was noted to be 290.  Patient was  asymptomatic shortly after admission. Lipid panel yielded hyperlipidemia with a 10 year risk of 22%. A1c obtained still pending at discharge. Patient's symptoms became under control after adequate glycemic control. He was deemed likely that hyperglycemia was the culprit to patient's vision changes. Patient reported issues w/ Lantus compliance due to cost. This is not entirely uncommon for some individuals w/ financial strain.   After ruling out CVD and relatively normal imaging studies patient was deemed medically stable for discharge and close follow-up scheduled. I had a lengthy discussion w/ patient about the importance of staying compliant with his medication regimen every day, especially the medications in control of his glucose. I also informed him that it would be INCREDIBLY beneficial to have him write down his morning and afternoon CBG's from now until his follow-up appointment with Dr. Caryl [name]. Patient stated his understanding. She was started on a statin medication at discharge. Atorvastatin was preferred however looking through patient's history he had had this on his medication regimen but it had been discontinued secondary to cost. Pravastatin was deemed appropriate second line at discharge. Patient reported he was at his BL at DC.  Issues for Follow Up:  1. Follow-up glycemic control medication compliance. Establish glycemic monitoring regimen. 2. Patient asked to chart his CBGs from discharge until his follow-up appointment w/ Dr. Caryl [name]. 3. Follow-up compliance with the new statin. Atorvastatin is preferred however pravastatin is significantly less expensive.  Significant Procedures: MRI/MRA, CT, Echo, Carotid duplex  Significant Labs and Imaging:   Recent Labs Lab 08/21/14 1604  WBC 4.4  HGB 11.4*  HCT 35.2*  PLT 253    Recent Labs Lab 08/21/14 1604  NA 137  K 4.0  CL 101  CO2 30  GLUCOSE 208*  BUN 19  CREATININE 1.35  CALCIUM 8.9   CT  Head  2/25 IMPRESSION: No acute intracranial abnormality. Right maxillary sinus air-fluid level.  MRI/MRA Head 2/25 IMPRESSION: 1. Unremarkable appearance of the brain without evidence of acute intracranial abnormality. 2. Right maxillary sinus fluid. Correlate clinically for signs of acute sinusitis. 3. Moderately motion degraded MRA without evidence of major intracranial arterial occlusion.  Echo 2/26 Study Conclusions - Left ventricle: The cavity size was normal. Wall thickness was normal. Systolic function was normal. The estimated ejection fraction was in the range of 50% to 55%. Doppler parameters are consistent with abnormal left ventricular relaxation (grade 1 diastolic dysfunction). - Aortic valve: AV may be functionally bicuspid.  Results/Tests Pending at Time of Discharge: A1c  Discharge Medications:    Medication List    TAKE these medications        aspirin 81 MG EC tablet  Take 81 mg by mouth daily. Swallow whole.     gabapentin 100 MG capsule  Commonly known as:  NEURONTIN  Take 1 capsule (100 mg total) by mouth 3 (three) times daily.     insulin aspart 100 UNIT/ML injection  Commonly known as:  novoLOG  Use 6-8 units with meals light in carbohydrates and 10 units with meals heavy in carbohydrates     insulin glargine 100 UNIT/ML injection  Commonly known as:  LANTUS  Inject 0.3 mLs (30 Units total) into the skin every morning.     lisinopril 10 MG tablet  Commonly known as:  PRINIVIL,ZESTRIL  Take 1 tablet (10 mg total) by mouth daily.     pravastatin 40 MG tablet  Commonly known as:  PRAVACHOL  Take 1 tablet (40 mg total) by mouth daily.        Discharge Instructions: Please refer to Patient Instructions section of EMR for full details.  Patient was counseled important signs and symptoms that should prompt return to medical care, changes in medications, dietary instructions, activity restrictions, and follow up appointments.   Follow-Up  Appointments: Follow-up Information    Follow up with Jeremiah Rumps, MD. Go on 09/01/2014.   Specialty:  Family Medicine   Why:  @2 :30 pm   Contact information:   [place] [address] [phone]       [name], MD 08/22/2014, 1:42 PM PGY-1, [place]

## 2014-08-22 NOTE — Progress Notes (Signed)
PT Cancellation Note  Patient Details Name: Jeremiah [name] MRN: [id number] DOB: 06/19/1966   Cancelled Treatment:    Reason Eval/Treat Not Completed: Patient at procedure or test/unavailable--pt out of room. Will attempt to evaluate today   Ilyse Tremain 08/22/2014, 9:27 AM Pager [phone]

## 2014-08-23 LAB — HEMOGLOBIN A1C
Hgb A1c MFr Bld: 11.3 % — ABNORMAL HIGH (ref 4.8–5.6)
Mean Plasma Glucose: 278 mg/dL

## 2014-08-26 LAB — HM DIABETES EYE EXAM

## 2014-09-01 ENCOUNTER — Inpatient Hospital Stay: Payer: Self-pay | Admitting: Family Medicine

## 2014-09-09 ENCOUNTER — Encounter: Payer: Self-pay | Admitting: Family Medicine

## 2014-09-09 ENCOUNTER — Ambulatory Visit (INDEPENDENT_AMBULATORY_CARE_PROVIDER_SITE_OTHER): Payer: Self-pay | Admitting: Family Medicine

## 2014-09-09 VITALS — BP 140/78 | HR 101 | Temp 98.1°F | Ht 71.5 in | Wt 200.0 lb

## 2014-09-09 DIAGNOSIS — I10 Essential (primary) hypertension: Secondary | ICD-10-CM

## 2014-09-09 DIAGNOSIS — E1342 Other specified diabetes mellitus with diabetic polyneuropathy: Secondary | ICD-10-CM

## 2014-09-09 DIAGNOSIS — E1142 Type 2 diabetes mellitus with diabetic polyneuropathy: Secondary | ICD-10-CM

## 2014-09-09 DIAGNOSIS — G629 Polyneuropathy, unspecified: Secondary | ICD-10-CM

## 2014-09-09 DIAGNOSIS — E119 Type 2 diabetes mellitus without complications: Secondary | ICD-10-CM

## 2014-09-09 DIAGNOSIS — E1165 Type 2 diabetes mellitus with hyperglycemia: Secondary | ICD-10-CM

## 2014-09-09 DIAGNOSIS — E785 Hyperlipidemia, unspecified: Secondary | ICD-10-CM

## 2014-09-09 DIAGNOSIS — E113419 Type 2 diabetes mellitus with severe nonproliferative diabetic retinopathy with macular edema, unspecified eye: Secondary | ICD-10-CM

## 2014-09-09 DIAGNOSIS — E11341 Type 2 diabetes mellitus with severe nonproliferative diabetic retinopathy with macular edema: Secondary | ICD-10-CM

## 2014-09-09 MED ORDER — INSULIN GLARGINE 100 UNIT/ML SOLOSTAR PEN: 35.0000 [IU] | PEN_INJECTOR | Freq: Every day | SUBCUTANEOUS | Status: DC

## 2014-09-09 MED ORDER — LISINOPRIL 20 MG PO TABS: 20.0000 mg | ORAL_TABLET | Freq: Every day | ORAL | Status: DC

## 2014-09-09 MED ORDER — INSULIN GLARGINE 100 UNIT/ML ~~LOC~~ SOLN: 35.0000 [IU] | SUBCUTANEOUS | Status: DC

## 2014-09-09 MED ORDER — INSULIN ASPART 100 UNIT/ML ~~LOC~~ SOLN: SUBCUTANEOUS | Status: DC

## 2014-09-09 NOTE — Assessment & Plan Note (Signed)
Last A1c not at goal and cbgs not at goal. Will increase lantus to 35 u daily. Given voucher to get this from the pharmacy. Keep novolog at 10 u with light carbohydrate meals and 12 units with heavy carbohydrate meals. He will follow-up with Dr Valentina Lucks in 3 weeks for refill of lantus and further titration of insulin.

## 2014-09-09 NOTE — Assessment & Plan Note (Signed)
Not at goal. Will increase lisinopril to 20 mg daily. Check BMET today and in one week.

## 2014-09-09 NOTE — Patient Instructions (Addendum)
Nice to see you. I am glad you were able to get in to the eye doctor. We are going to go up on your lantus to 35 units daily. Please continue your novolog 10-12 units with meals.  We will have you come back in 3 weeks to see Dr Valentina Lucks.  We are going to increase your lisinopril to 20 mg. Please take two 10 mg tablets until you run out of your current prescription, then pick up the new prescription.

## 2014-09-09 NOTE — Assessment & Plan Note (Signed)
Patient seems improved in left eye with procedure by optho. He will keep his appointment with them on 3/22. No red eye to indicate glaucoma and just saw optho with no mention of glaucoma. Advised to seek medical attention if pain worsens or eye becomes red.

## 2014-09-09 NOTE — Assessment & Plan Note (Signed)
Tolerating pravastatin. Continue medication.

## 2014-09-09 NOTE — Progress Notes (Signed)
Patient ID: Jeremiah [name], male   DOB: 12-11-1965, 49 y.o.   MRN: [id number]  [name], MD Phone: [phone]  Jeremiah [name] is a 49 y.o. male who presents today for f/u.  DIABETES Disease Monitoring: Blood Sugar ranges-220-230, has had some in 80's Polyuria/phagia/dipsia- notes some polyuria      Visual problems- yes, see below Medications: Compliance- taking lantus daily, just ran out. Taking novolog when cbg >200. Hypoglycemic symptoms- feels confused if sugar gets to 80's, drinks OJ and feels better  Diabetic retinopathy: notes he saw optho on 3/1 and had injection in his left eye that helped his vision tremendously. Previously had pain in his left eye and this resolved with the injection. Notes vision in right eye is at its baseline and continues to have chronic unchanged pain in this eye. Has appointment for injection in right eye on 3/22.  HYPERLIPIDEMIA Symptoms Chest pain on exertion:  no   Leg claudication:   no Medications: Compliance- taking pravastatin Right upper quadrant pain- no  Muscle aches- no  HYPERTENSION Disease Monitoring Home BP Monitoring not checking Chest pain- no    Dyspnea- no Medications Compliance-  Taking lisinopril.  Edema- no  Patient is a nonsmoker.   ROS: Per HPI   Physical Exam Filed Vitals:   09/09/14 1420  BP: 140/78  Pulse:   Temp:     Gen: Well NAD HEENT: PERRL,  MMM, normal visual field testing, EOMI Lungs: CTABL Nl WOB Heart: RRR  Exts: Non edematous BL  LE, warm and well perfused.    Assessment/Plan: Please see individual problem list.  [name], MD [place] PGY-3

## 2014-09-10 ENCOUNTER — Encounter: Payer: Self-pay | Admitting: Family Medicine

## 2014-09-10 DIAGNOSIS — N182 Chronic kidney disease, stage 2 (mild): Secondary | ICD-10-CM | POA: Insufficient documentation

## 2014-09-10 LAB — BASIC METABOLIC PANEL
BUN: 17 mg/dL (ref 6–23)
CO2: 28 mEq/L (ref 19–32)
Calcium: 8.5 mg/dL (ref 8.4–10.5)
Chloride: 103 mEq/L (ref 96–112)
Creat: 1.43 mg/dL — ABNORMAL HIGH (ref 0.50–1.35)
Glucose, Bld: 284 mg/dL — ABNORMAL HIGH (ref 70–99)
Potassium: 4.1 mEq/L (ref 3.5–5.3)
Sodium: 139 mEq/L (ref 135–145)

## 2014-09-10 NOTE — Addendum Note (Signed)
Addended by: [name] on: 09/10/2014 03:31 PM   Modules accepted: Level of Service

## 2014-09-16 ENCOUNTER — Telehealth: Payer: Self-pay | Admitting: Family Medicine

## 2014-09-16 ENCOUNTER — Other Ambulatory Visit: Payer: Self-pay

## 2014-09-16 DIAGNOSIS — I10 Essential (primary) hypertension: Secondary | ICD-10-CM

## 2014-09-16 LAB — BASIC METABOLIC PANEL
BUN: 28 mg/dL — ABNORMAL HIGH (ref 6–23)
CO2: 27 mEq/L (ref 19–32)
Calcium: 8.7 mg/dL (ref 8.4–10.5)
Chloride: 97 mEq/L (ref 96–112)
Creat: 1.63 mg/dL — ABNORMAL HIGH (ref 0.50–1.35)
Glucose, Bld: 509 mg/dL (ref 70–99)
Potassium: 5 mEq/L (ref 3.5–5.3)
Sodium: 131 mEq/L — ABNORMAL LOW (ref 135–145)

## 2014-09-16 NOTE — Progress Notes (Signed)
[facility] phlebotomist drew:  BMP 

## 2014-09-16 NOTE — Telephone Encounter (Signed)
Emergency Line / After Hours Call  Critical glucose of 509 called from [facility].   Called Mr. Jeremiah [name]. Was seen by [facility] opthomology and had a procedure done today. Currently is out of Lantus. Taking novolog Sliding scale. 10-12 U with every meal. He has been doing this the last three days. He feels ok other than having a procedure done on his eyes. His blood sugar 10 minutes ago was 298. He gave himself 8 U of novolog as he ate a salad. He plans on obtaining his lantus tomorrow if everything goes through. He reports feeling fine and normal state of being.   Recommended to call clinic and let his PCP know if he is unable to obtain his lantus tomorrow. May need to pursue a different route of long acting control. Given red flags and need for emergent evaluation in the ED and patient understood.   Clearance Coots, MD Family Medicine PGY-2

## 2014-09-17 ENCOUNTER — Other Ambulatory Visit: Payer: Self-pay | Admitting: *Deleted

## 2014-09-17 ENCOUNTER — Telehealth: Payer: Self-pay | Admitting: Family Medicine

## 2014-09-17 ENCOUNTER — Other Ambulatory Visit: Payer: Self-pay

## 2014-09-17 DIAGNOSIS — I10 Essential (primary) hypertension: Secondary | ICD-10-CM

## 2014-09-17 LAB — BASIC METABOLIC PANEL
BUN: 30 mg/dL — ABNORMAL HIGH (ref 6–23)
CO2: 26 mEq/L (ref 19–32)
Calcium: 8.7 mg/dL (ref 8.4–10.5)
Chloride: 102 mEq/L (ref 96–112)
Creat: 1.5 mg/dL — ABNORMAL HIGH (ref 0.50–1.35)
Glucose, Bld: 244 mg/dL — ABNORMAL HIGH (ref 70–99)
Potassium: 4.4 mEq/L (ref 3.5–5.3)
Sodium: 137 mEq/L (ref 135–145)

## 2014-09-17 LAB — GLUCOSE, CAPILLARY: Glucose-Capillary: 209 mg/dL — ABNORMAL HIGH (ref 70–99)

## 2014-09-17 MED ORDER — LISINOPRIL 10 MG PO TABS: 10.0000 mg | ORAL_TABLET | Freq: Every day | ORAL | Status: DC

## 2014-09-17 MED ORDER — INSULIN DETEMIR 100 UNIT/ML ~~LOC~~ SOLN: 5.0000 [IU] | Freq: Two times a day (BID) | SUBCUTANEOUS | Status: DC

## 2014-09-17 MED ORDER — AMLODIPINE BESYLATE 5 MG PO TABS
5.0000 mg | ORAL_TABLET | Freq: Every day | ORAL | Status: DC
Start: 2014-09-17 — End: 2014-09-18

## 2014-09-17 NOTE — Telephone Encounter (Signed)
Pt informed of the message from MD.  He will come to get meds now. Jeremiah [name], Jeremiah [name]

## 2014-09-17 NOTE — Telephone Encounter (Signed)
Attempted to call patient back to inform him that there would be levemir waiting for him to pick up today. I also wanted to inform him that his kidney function has worsened over the past several weeks and we need to decrease his lisinopril back down to 10 mg daily. I will send in an additional blood pressure medication called amlodipine for him to take. If he now has the orange card he should be able to get this medication from the health department. If he does not I will need to send in another medication for him. When he comes to pick up the medication could you find out if he has syringes and needles for the insulin? He will also need to come back Monday for blood work and some time next week to see Dr Valentina Lucks for a diabetes visit. Thanks.

## 2014-09-17 NOTE — Progress Notes (Signed)
[facility] phlebotomist drew:  BMP 

## 2014-09-18 ENCOUNTER — Ambulatory Visit (INDEPENDENT_AMBULATORY_CARE_PROVIDER_SITE_OTHER): Payer: Self-pay | Admitting: Pharmacist

## 2014-09-18 ENCOUNTER — Encounter: Payer: Self-pay | Admitting: Pharmacist

## 2014-09-18 VITALS — BP 125/85 | HR 79 | Ht 71.5 in | Wt 194.0 lb

## 2014-09-18 DIAGNOSIS — E11311 Type 2 diabetes mellitus with unspecified diabetic retinopathy with macular edema: Secondary | ICD-10-CM

## 2014-09-18 DIAGNOSIS — IMO0002 Reserved for concepts with insufficient information to code with codable children: Secondary | ICD-10-CM

## 2014-09-18 DIAGNOSIS — E1142 Type 2 diabetes mellitus with diabetic polyneuropathy: Secondary | ICD-10-CM

## 2014-09-18 DIAGNOSIS — E1342 Other specified diabetes mellitus with diabetic polyneuropathy: Secondary | ICD-10-CM

## 2014-09-18 DIAGNOSIS — E1165 Type 2 diabetes mellitus with hyperglycemia: Secondary | ICD-10-CM

## 2014-09-18 DIAGNOSIS — E118 Type 2 diabetes mellitus with unspecified complications: Secondary | ICD-10-CM

## 2014-09-18 DIAGNOSIS — G629 Polyneuropathy, unspecified: Secondary | ICD-10-CM

## 2014-09-18 DIAGNOSIS — I10 Essential (primary) hypertension: Secondary | ICD-10-CM

## 2014-09-18 MED ORDER — INSULIN GLARGINE 100 UNIT/ML SOLOSTAR PEN: 32.0000 [IU] | PEN_INJECTOR | Freq: Every day | SUBCUTANEOUS | Status: DC

## 2014-09-18 MED ORDER — AMLODIPINE BESYLATE 5 MG PO TABS: 5.0000 mg | ORAL_TABLET | Freq: Every day | ORAL | Status: DC

## 2014-09-18 MED ORDER — GABAPENTIN 300 MG PO CAPS: 300.0000 mg | ORAL_CAPSULE | Freq: Three times a day (TID) | ORAL | Status: DC

## 2014-09-18 MED ORDER — INSULIN ASPART 100 UNIT/ML FLEXPEN: PEN_INJECTOR | SUBCUTANEOUS | Status: DC

## 2014-09-18 NOTE — Progress Notes (Signed)
Patient ID: Jeremiah [name], male   DOB: 01-20-1966, 49 y.o.   MRN: [id number] Reviewed: Agree with Dr. Graylin Shiver documentation and management.

## 2014-09-18 NOTE — Progress Notes (Signed)
S:    Patient arrives in fairly good spirits.    Presents for diabetes management/assitance with an A1c of 11.3 Patient reports inability to afford Lantus - however he was provided a free coupon card that he failed to take to the pharmacy.  He was provided Levemir samples yesterday in office.   He is [facility] to [facility] and [facility] for his medications.   Reports he has NOT yet had a Dental visit.   Patient reports NON-adherence with medications. Current diabetes medications include Lantus (out for several days) and Novolog 10 units with each meal.   Patient denies hypoglycemic events.  Lowest reading was 84.   Patient reported dietary habits: Are fair.   He is happy that his weight has increased 10 lbs since starting insulin.   Patient reported exercise habits: moderate with walking only short distances due to his foot pain.    Patient reports nocturia. Patient reports neuropathy.   3 novolog bottles left. Lantus is completely gone. Dr. Caryl Bis gave a prescription coupon code for lantus, patient hasnt tried it yet.  He has an orange card.   O:  . Lab Results  Component Value Date   HGBA1C 11.3* 08/22/2014     Home fasting CBG: > 400 readings frequently.    A/P: Diabetes longstanding currently remains in poor control for multiple reasons.  Likely dental infection without care is a significant reason for poor control.  He checked on his dental referral at the end of the visit.  At this time the dental referral has been faxed. denies hypoglycemic events and is able to verbalize appropriate hypoglycemia management plan.  reports NONadherence with medication due to cost and inability to get free Lantus with coupon. Control is suboptimal due to many reasons.  Increased dose of basal insulin Lantus (insulin glargine) to 32 units daily. Increased dose of rapid insulin Novolog (insulin aspart) to 12 or 15 units depending on blood sugar AND carb content of meals.    Consider addition of GLP therapy at next visit.  Next A1C anticipated 3 months. Written patient instructions provided.  Follow up in Pharmacist Clinic Visit in two weeks OR after next visit with Dr. Caryl Bis.   Total time in face to face counseling 35 minutes.  Patient seen with Richarda Osmond, PharmD Candidate.  . Hypertension longstanding - undercontrol today however patient has NOT picked up or started amlodipine.   Possible labile BP due to pain with dental infection.   May have improved control as dental pain is controlled in future.  Encouraged him to start and take amlodipine as well as his lisinopril.

## 2014-09-18 NOTE — Assessment & Plan Note (Signed)
Diabetes longstanding currently remains in poor control for multiple reasons.  Likely dental infection without care is a significant reason for poor control.  He checked on his dental referral at the end of the visit.  At this time the dental referral has been faxed. denies hypoglycemic events and is able to verbalize appropriate hypoglycemia management plan.  reports NONadherence with medication due to cost and inability to get free Lantus with coupon. Control is suboptimal due to many reasons.  Increased dose of basal insulin Lantus (insulin glargine) to 32 units daily. Increased dose of rapid insulin Novolog (insulin aspart) to 12 or 15 units depending on blood sugar AND carb content of meals.   Consider addition of GLP therapy at next visit.  Next A1C anticipated 3 months. Written patient instructions provided.  Follow up in Pharmacist Clinic Visit in two weeks OR after next visit with Dr. Caryl Bis.   Total time in face to face counseling 35 minutes.  Patient seen with Richarda Osmond, PharmD Candidate.

## 2014-09-18 NOTE — Patient Instructions (Addendum)
Pick up your amlodipine today at the [facility] [facility].  Take both amlodipine and lisinopril daily in the evening.   Take them with your pravachol - cholesterol pill.   Please take your paper prescription and coupon (from Dr. Caryl Bis) for Lantus to a local [facility] - if they fill this - take the lantus as follows: 32 units each morning.   Novolog - please take 12 units with your lower carb meals and 15 with your higher carb meals.   Gabapentin at a new 300mg  dose was sent to the [facility] with your [employer] and $6 you should get a supply today.    If you need to use the levemir. Take 15 units twice daily.  Next visit with Dr. Caryl Bis next week if possible.

## 2014-09-18 NOTE — Assessment & Plan Note (Signed)
Uncontrolled Neuropathy with low dose gabapentin and poor glycemic control. Adjusted insulin regimen and increased dose of gabapentin from 100mg  TID to 300mg  TID.

## 2014-09-18 NOTE — Assessment & Plan Note (Signed)
Hypertension longstanding - undercontrol today however patient has NOT picked up or started amlodipine.   Possible labile BP due to pain with dental infection.   May have improved control as dental pain is controlled in future.  Encouraged him to start and take amlodipine as well as his lisinopril.

## 2014-09-23 ENCOUNTER — Telehealth: Payer: Self-pay | Admitting: Family Medicine

## 2014-09-23 NOTE — Telephone Encounter (Signed)
Attempted to call patient to inform of his blood work. There was no answer and his voicemail is full. His kidney function is slightly improved from previously. We will need to recheck this at his next office visit to ensure it continues to improve. Will ask blue team nurses to give the patient a call to let him know.

## 2014-09-23 NOTE — Telephone Encounter (Signed)
Tried to call patient again but mailbox was full.  Patient has an appt on 09/30/2014 to follow up. Alani Sabbagh,CMA

## 2014-09-30 ENCOUNTER — Ambulatory Visit: Payer: Self-pay | Admitting: Family Medicine

## 2014-10-02 ENCOUNTER — Ambulatory Visit: Payer: Self-pay | Admitting: Pharmacist

## 2014-10-21 ENCOUNTER — Telehealth: Payer: Self-pay | Admitting: *Deleted

## 2014-10-21 NOTE — Telephone Encounter (Signed)
Dawn from [facility] called requesting a change from Novolog Pen to Novolog vials.  If changing please send in new Rx for Novolog vials to [facility] at [place]. Derl Barrow, RN

## 2014-10-22 MED ORDER — INSULIN ASPART 100 UNIT/ML ~~LOC~~ SOLN: SUBCUTANEOUS | Status: DC

## 2014-10-22 NOTE — Telephone Encounter (Signed)
Sent vials to pharmacy

## 2014-11-27 ENCOUNTER — Ambulatory Visit (INDEPENDENT_AMBULATORY_CARE_PROVIDER_SITE_OTHER): Payer: Self-pay | Admitting: Family Medicine

## 2014-11-27 ENCOUNTER — Encounter: Payer: Self-pay | Admitting: Family Medicine

## 2014-11-27 VITALS — BP 136/84 | HR 88 | Temp 98.1°F | Ht 71.0 in | Wt 199.1 lb

## 2014-11-27 DIAGNOSIS — M7660 Achilles tendinitis, unspecified leg: Secondary | ICD-10-CM | POA: Insufficient documentation

## 2014-11-27 DIAGNOSIS — M7661 Achilles tendinitis, right leg: Secondary | ICD-10-CM

## 2014-11-27 DIAGNOSIS — M7662 Achilles tendinitis, left leg: Secondary | ICD-10-CM

## 2014-11-27 DIAGNOSIS — E1165 Type 2 diabetes mellitus with hyperglycemia: Secondary | ICD-10-CM

## 2014-11-27 DIAGNOSIS — K047 Periapical abscess without sinus: Secondary | ICD-10-CM

## 2014-11-27 DIAGNOSIS — I10 Essential (primary) hypertension: Secondary | ICD-10-CM

## 2014-11-27 DIAGNOSIS — E119 Type 2 diabetes mellitus without complications: Secondary | ICD-10-CM

## 2014-11-27 LAB — COMPREHENSIVE METABOLIC PANEL
ALT: 11 U/L (ref 0–53)
AST: 17 U/L (ref 0–37)
Albumin: 2.6 g/dL — ABNORMAL LOW (ref 3.5–5.2)
Alkaline Phosphatase: 77 U/L (ref 39–117)
BUN: 20 mg/dL (ref 6–23)
CO2: 29 mEq/L (ref 19–32)
Calcium: 8.5 mg/dL (ref 8.4–10.5)
Chloride: 100 mEq/L (ref 96–112)
Creat: 1.42 mg/dL — ABNORMAL HIGH (ref 0.50–1.35)
Glucose, Bld: 484 mg/dL — ABNORMAL HIGH (ref 70–99)
Potassium: 5 mEq/L (ref 3.5–5.3)
Sodium: 134 mEq/L — ABNORMAL LOW (ref 135–145)
Total Bilirubin: 0.2 mg/dL (ref 0.2–1.2)
Total Protein: 5.2 g/dL — ABNORMAL LOW (ref 6.0–8.3)

## 2014-11-27 LAB — POCT GLYCOSYLATED HEMOGLOBIN (HGB A1C): Hemoglobin A1C: 11.8

## 2014-11-27 NOTE — Assessment & Plan Note (Signed)
Not at goal on recheck, though improved. Will start on medications previously prescribed. Advised to contact us if develops light headedness after starting medications.

## 2014-11-27 NOTE — Assessment & Plan Note (Signed)
Needs to see dentist. Previously referred. Is on waiting list. Given number to call to check on position on wait list.

## 2014-11-27 NOTE — Patient Instructions (Addendum)
Nice to see you. Please call the dental clinic to see where you are on the list. Please get your medication for your blood pressure and get the insulin as well. Please go up on your lantus to 40 units a day.   Please do the exercises for your achilles tendon as well. You can also buy heal lifts to see if this will help. Ice helps as well.  Please keep a daily eye on your feet as well.  If you develop a sore on your feet, chest pain, trouble breathing, fever, or worsening tooth pain please let us know.

## 2014-11-27 NOTE — Assessment & Plan Note (Signed)
Location of discomfort and exam consistent with achilles tendinitis. Discussed icing and heel lifts. Advised on eccentric stretching for this. Tylenol for discomfort. Continue to monitor.

## 2014-11-27 NOTE — Assessment & Plan Note (Signed)
Remains poorly controlled. Just started on lantus at a more appropriate dose in the past week. Will increase lantus dose to 40 u daily. Continue novolog as he has been doing. Will have him see Dr Valentina Lucks in the next 1-2 weeks to work on titration of novolog. Will check renal function today.

## 2014-11-27 NOTE — Progress Notes (Signed)
Patient ID: Jeremiah [name], male   DOB: 07-06-1965, 49 y.o.   MRN: [id number]  Jeremiah Rumps, MD Phone: [phone]  Jeremiah [name] is a 49 y.o. male who presents today for f/u.  HYPERTENSION Disease Monitoring Home BP Monitoring not checking Chest pain- no    Dyspnea- no Medications Compliance-  Not taking medication, has not received it from [facility], states will receive it in the next couple of days.  Edema- no  Heal pain: patient notes heal discomfort with walking for the past couple of months. Notes it is at the site of the achilles insertion. Notes some swelling at the site of achilles insertion. This was gradual onset. No injury. Discomfort is worst with heal strike. Has been taking tylenol for this.   DIABETES Disease Monitoring: Blood Sugar ranges-180-250 Polyuria/phagia/dipsia- no      Optho- followed closely, has upcoming appointment. Medications: Compliance- just started on lantus 35 units daily in the past week. Is taking novolog 10-12 units if cbg >250, and if 200-250 will take 2 units. Previously was taking levemir 30 units daily. Notes he has had issues getting meds from [facility] program, though they are to get full supply of his insulin in in the next several days.  Hypoglycemic symptoms- 2x in the past month. Will eat something and feels improved.  Notes he had a blister on his foot several weeks ago that popped and healed. No erythema. No pain. No swelling.   Tooth pain: notes continued left sided lower posterior molar pain. This tooth cracked off. Hurts when brushes his teeth. No fevers or jaw swelling. Finished antibiotics and this helped. Has not been to see dentist yet as he is awaiting an appointment.    PMH: nonsmoker.   ROS: Per HPI   Physical Exam Filed Vitals:   11/27/14 1608  BP: 136/84  Pulse:   Temp:     Gen: Well NAD HEENT: PERRL,  MMM, left posterior lower molar cracked off and broken, no swelling, no drainage, no tenderness, no jaw  swelling or tenderness Lungs: CTABL Nl WOB Heart: RRR, no murmur appreciated  MSK: no ankle swelling, no swelling at achilles, there is tenderness at site of insertion of achilles, achilles tendon is intact, no plantar fascia tenderness, left foot with scar from blister, no tenderness, swelling, or erythema, no other lesions on feet, 2+ DP pulses Neuro: 5/5 strength plantar and dorsiflexion, inversion and eversion, sensation to light touch intact, decreased sensation to monofilament Exts: Non edematous BL  LE, warm and well perfused.    Assessment/Plan: Please see individual problem list.  Jeremiah Rumps, MD [facility] PGY-3

## 2014-12-01 ENCOUNTER — Encounter: Payer: Self-pay | Admitting: Family Medicine

## 2014-12-05 ENCOUNTER — Ambulatory Visit: Payer: Self-pay | Admitting: Pharmacist

## 2014-12-20 ENCOUNTER — Other Ambulatory Visit: Payer: Self-pay | Admitting: Family Medicine

## 2014-12-20 DIAGNOSIS — I10 Essential (primary) hypertension: Secondary | ICD-10-CM

## 2015-03-25 ENCOUNTER — Other Ambulatory Visit: Payer: Self-pay | Admitting: *Deleted

## 2015-03-25 MED ORDER — INSULIN ASPART 100 UNIT/ML ~~LOC~~ SOLN: SUBCUTANEOUS | Status: DC

## 2015-04-22 ENCOUNTER — Ambulatory Visit: Payer: Self-pay | Admitting: Family Medicine

## 2015-07-31 ENCOUNTER — Ambulatory Visit: Payer: Self-pay | Admitting: Family Medicine

## 2016-04-10 ENCOUNTER — Encounter (HOSPITAL_COMMUNITY): Payer: Self-pay | Admitting: *Deleted

## 2016-04-10 DIAGNOSIS — N182 Chronic kidney disease, stage 2 (mild): Secondary | ICD-10-CM | POA: Insufficient documentation

## 2016-04-10 DIAGNOSIS — Z794 Long term (current) use of insulin: Secondary | ICD-10-CM | POA: Insufficient documentation

## 2016-04-10 DIAGNOSIS — I129 Hypertensive chronic kidney disease with stage 1 through stage 4 chronic kidney disease, or unspecified chronic kidney disease: Secondary | ICD-10-CM | POA: Insufficient documentation

## 2016-04-10 DIAGNOSIS — Z8673 Personal history of transient ischemic attack (TIA), and cerebral infarction without residual deficits: Secondary | ICD-10-CM | POA: Insufficient documentation

## 2016-04-10 DIAGNOSIS — L03116 Cellulitis of left lower limb: Secondary | ICD-10-CM | POA: Insufficient documentation

## 2016-04-10 DIAGNOSIS — Z79899 Other long term (current) drug therapy: Secondary | ICD-10-CM | POA: Insufficient documentation

## 2016-04-10 DIAGNOSIS — Z23 Encounter for immunization: Secondary | ICD-10-CM | POA: Insufficient documentation

## 2016-04-10 DIAGNOSIS — E1165 Type 2 diabetes mellitus with hyperglycemia: Secondary | ICD-10-CM | POA: Insufficient documentation

## 2016-04-10 DIAGNOSIS — E1142 Type 2 diabetes mellitus with diabetic polyneuropathy: Secondary | ICD-10-CM | POA: Insufficient documentation

## 2016-04-10 DIAGNOSIS — Z7982 Long term (current) use of aspirin: Secondary | ICD-10-CM | POA: Insufficient documentation

## 2016-04-10 LAB — BASIC METABOLIC PANEL
Anion gap: 9 (ref 5–15)
BUN: 27 mg/dL — ABNORMAL HIGH (ref 6–20)
CO2: 24 mmol/L (ref 22–32)
Calcium: 8.2 mg/dL — ABNORMAL LOW (ref 8.9–10.3)
Chloride: 99 mmol/L — ABNORMAL LOW (ref 101–111)
Creatinine, Ser: 2.49 mg/dL — ABNORMAL HIGH (ref 0.61–1.24)
GFR calc Af Amer: 33 mL/min — ABNORMAL LOW (ref >60)
GFR calc non Af Amer: 29 mL/min — ABNORMAL LOW (ref >60)
Glucose, Bld: 530 mg/dL (ref 65–99)
Potassium: 4.2 mmol/L (ref 3.5–5.1)
Sodium: 132 mmol/L — ABNORMAL LOW (ref 135–145)

## 2016-04-10 LAB — CBC
HCT: 30.2 % — ABNORMAL LOW (ref 39.0–52.0)
Hemoglobin: 9.8 g/dL — ABNORMAL LOW (ref 13.0–17.0)
MCH: 23.3 pg — ABNORMAL LOW (ref 26.0–34.0)
MCHC: 32.5 g/dL (ref 30.0–36.0)
MCV: 71.9 fL — ABNORMAL LOW (ref 78.0–100.0)
Platelets: 311 10*3/uL (ref 150–400)
RBC: 4.2 MIL/uL — ABNORMAL LOW (ref 4.22–5.81)
RDW: 14.2 % (ref 11.5–15.5)
WBC: 9.5 10*3/uL (ref 4.0–10.5)

## 2016-04-10 LAB — URINE MICROSCOPIC-ADD ON

## 2016-04-10 LAB — URINALYSIS, ROUTINE W REFLEX MICROSCOPIC
Bilirubin Urine: NEGATIVE
Glucose, UA: >1000 mg/dL — AB
Ketones, ur: NEGATIVE mg/dL
Leukocytes, UA: NEGATIVE
Nitrite: NEGATIVE
Protein, ur: >300 mg/dL — AB
Specific Gravity, Urine: 1.024 (ref 1.005–1.030)
pH: 6 (ref 5.0–8.0)

## 2016-04-10 LAB — CBG MONITORING, ED: Glucose-Capillary: 471 mg/dL — ABNORMAL HIGH (ref 65–99)

## 2016-04-10 NOTE — ED Notes (Addendum)
CBG: 471, informed nurse in triage

## 2016-04-10 NOTE — ED Triage Notes (Signed)
Pt c/o hyperglycemia, hot and cold flashes, and swelling to L foot after stepping on a nail. Pt has been out of insulin x 2 days. CBG 471 in triage

## 2016-04-11 ENCOUNTER — Emergency Department (HOSPITAL_COMMUNITY): Payer: Self-pay

## 2016-04-11 ENCOUNTER — Emergency Department (HOSPITAL_COMMUNITY)
Admission: EM | Admit: 2016-04-11 | Discharge: 2016-04-11 | Disposition: A | Discharge status: Home / Self Care | Payer: Self-pay | Attending: Emergency Medicine | Admitting: Emergency Medicine

## 2016-04-11 DIAGNOSIS — R739 Hyperglycemia, unspecified: Secondary | ICD-10-CM

## 2016-04-11 DIAGNOSIS — L03116 Cellulitis of left lower limb: Secondary | ICD-10-CM

## 2016-04-11 MED ORDER — CIPROFLOXACIN HCL 500 MG PO TABS
500.0000 mg | ORAL_TABLET | Freq: Once | ORAL | Status: AC
  Administered 2016-04-11: 500 mg via ORAL
  Filled 2016-04-11: qty 1

## 2016-04-11 MED ORDER — CIPROFLOXACIN HCL 500 MG PO TABS: 500.0000 mg | ORAL_TABLET | Freq: Two times a day (BID) | ORAL | 0 refills | Status: DC

## 2016-04-11 MED ORDER — TETANUS-DIPHTH-ACELL PERTUSSIS 5-2.5-18.5 LF-MCG/0.5 IM SUSP
0.5000 mL | Freq: Once | INTRAMUSCULAR | Status: AC
  Administered 2016-04-11: 0.5 mL via INTRAMUSCULAR
  Filled 2016-04-11: qty 0.5

## 2016-04-11 MED ORDER — SODIUM CHLORIDE 0.9 % IV BOLUS (SEPSIS)
2000.0000 mL | Freq: Once | INTRAVENOUS | Status: AC
  Administered 2016-04-11: 2000 mL via INTRAVENOUS
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
## 2016-04-11 NOTE — ED Notes (Signed)
Pt returned from X-ray.  

## 2016-04-11 NOTE — ED Notes (Signed)
Patient transported to X-ray 

## 2016-04-11 NOTE — Discharge Instructions (Signed)
Call your family doctor today to possibly be seen this morning.

## 2016-06-02 ENCOUNTER — Emergency Department (HOSPITAL_COMMUNITY): Payer: Self-pay

## 2016-06-02 ENCOUNTER — Encounter (HOSPITAL_COMMUNITY): Payer: Self-pay | Admitting: Emergency Medicine

## 2016-06-02 ENCOUNTER — Emergency Department (HOSPITAL_COMMUNITY)
Admission: EM | Admit: 2016-06-02 | Discharge: 2016-06-02 | Disposition: A | Discharge status: Home / Self Care | Payer: Self-pay | Attending: Emergency Medicine | Admitting: Emergency Medicine

## 2016-06-02 DIAGNOSIS — S92414A Nondisplaced fracture of proximal phalanx of right great toe, initial encounter for closed fracture: Secondary | ICD-10-CM | POA: Insufficient documentation

## 2016-06-02 DIAGNOSIS — Z8673 Personal history of transient ischemic attack (TIA), and cerebral infarction without residual deficits: Secondary | ICD-10-CM | POA: Insufficient documentation

## 2016-06-02 DIAGNOSIS — Y929 Unspecified place or not applicable: Secondary | ICD-10-CM | POA: Insufficient documentation

## 2016-06-02 DIAGNOSIS — E1140 Type 2 diabetes mellitus with diabetic neuropathy, unspecified: Secondary | ICD-10-CM | POA: Insufficient documentation

## 2016-06-02 DIAGNOSIS — I129 Hypertensive chronic kidney disease with stage 1 through stage 4 chronic kidney disease, or unspecified chronic kidney disease: Secondary | ICD-10-CM | POA: Insufficient documentation

## 2016-06-02 DIAGNOSIS — Z794 Long term (current) use of insulin: Secondary | ICD-10-CM | POA: Insufficient documentation

## 2016-06-02 DIAGNOSIS — L02619 Cutaneous abscess of unspecified foot: Secondary | ICD-10-CM

## 2016-06-02 DIAGNOSIS — E11319 Type 2 diabetes mellitus with unspecified diabetic retinopathy without macular edema: Secondary | ICD-10-CM | POA: Insufficient documentation

## 2016-06-02 DIAGNOSIS — W228XXA Striking against or struck by other objects, initial encounter: Secondary | ICD-10-CM | POA: Insufficient documentation

## 2016-06-02 DIAGNOSIS — N182 Chronic kidney disease, stage 2 (mild): Secondary | ICD-10-CM | POA: Insufficient documentation

## 2016-06-02 DIAGNOSIS — Y939 Activity, unspecified: Secondary | ICD-10-CM | POA: Insufficient documentation

## 2016-06-02 DIAGNOSIS — Y999 Unspecified external cause status: Secondary | ICD-10-CM | POA: Insufficient documentation

## 2016-06-02 DIAGNOSIS — L03119 Cellulitis of unspecified part of limb: Secondary | ICD-10-CM

## 2016-06-02 LAB — CBC WITH DIFFERENTIAL/PLATELET
Basophils Absolute: 0 10*3/uL (ref 0.0–0.1)
Basophils Relative: 0 %
Eosinophils Absolute: 0.1 10*3/uL (ref 0.0–0.7)
Eosinophils Relative: 1 %
HCT: 28.9 % — ABNORMAL LOW (ref 39.0–52.0)
Hemoglobin: 9.3 g/dL — ABNORMAL LOW (ref 13.0–17.0)
Lymphocytes Relative: 23 %
Lymphs Abs: 1.3 10*3/uL (ref 0.7–4.0)
MCH: 23.2 pg — ABNORMAL LOW (ref 26.0–34.0)
MCHC: 32.2 g/dL (ref 30.0–36.0)
MCV: 72.1 fL — ABNORMAL LOW (ref 78.0–100.0)
Monocytes Absolute: 0.5 10*3/uL (ref 0.1–1.0)
Monocytes Relative: 8 %
Neutro Abs: 3.8 10*3/uL (ref 1.7–7.7)
Neutrophils Relative %: 68 %
Platelets: 305 10*3/uL (ref 150–400)
RBC: 4.01 MIL/uL — ABNORMAL LOW (ref 4.22–5.81)
RDW: 14.8 % (ref 11.5–15.5)
WBC: 5.7 10*3/uL (ref 4.0–10.5)

## 2016-06-02 LAB — BASIC METABOLIC PANEL
Anion gap: 6 (ref 5–15)
BUN: 24 mg/dL — ABNORMAL HIGH (ref 6–20)
CO2: 25 mmol/L (ref 22–32)
Calcium: 8 mg/dL — ABNORMAL LOW (ref 8.9–10.3)
Chloride: 104 mmol/L (ref 101–111)
Creatinine, Ser: 2.3 mg/dL — ABNORMAL HIGH (ref 0.61–1.24)
GFR calc Af Amer: 36 mL/min — ABNORMAL LOW (ref >60)
GFR calc non Af Amer: 31 mL/min — ABNORMAL LOW (ref >60)
Glucose, Bld: 272 mg/dL — ABNORMAL HIGH (ref 65–99)
Potassium: 4.4 mmol/L (ref 3.5–5.1)
Sodium: 135 mmol/L (ref 135–145)

## 2016-06-02 LAB — SEDIMENTATION RATE: Sed Rate: 82 mm/hr — ABNORMAL HIGH (ref 0–16)

## 2016-06-02 LAB — C-REACTIVE PROTEIN: CRP: <0.8 mg/dL (ref <1.0)

## 2016-06-02 MED ORDER — CEPHALEXIN 500 MG PO CAPS: 500.0000 mg | ORAL_CAPSULE | Freq: Four times a day (QID) | ORAL | 0 refills | Status: DC

## 2016-06-02 MED ORDER — SULFAMETHOXAZOLE-TRIMETHOPRIM 800-160 MG PO TABS: 1.0000 | ORAL_TABLET | Freq: Two times a day (BID) | ORAL | 0 refills | Status: AC
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
## 2016-06-02 NOTE — ED Notes (Signed)
Discharge papers/instructions given to pt. Alert and oriented to person and place. Aware of CBG and pt is eating sandwich and given additional orange juice. Will watch pt and recheck cbg prior to discharge

## 2016-06-02 NOTE — ED Notes (Signed)
Pt returned from [place]. Phlebotomy at the bedside

## 2016-06-02 NOTE — ED Notes (Signed)
Pt taken by Geoffery Spruce, [facility]

## 2016-06-02 NOTE — ED Notes (Signed)
Pt's CBG result was 43. Informed Tatyana - PA.

## 2016-06-02 NOTE — ED Notes (Signed)
Pt's CBG result was 66. Informed Dr. Kathrynn Humble.

## 2016-06-02 NOTE — ED Notes (Signed)
Pt given orange juice and [place] sandwich. Tatyana - PA and Dr. Kathrynn Humble informed.

## 2016-06-02 NOTE — ED Notes (Signed)
Pt alert and oriented, ambulatory. cbg noted to be 66. Speaking with EDP and states he feels fine to go home. An additional sandwich and drink given to pt. Ok'd to discharge home by edp.

## 2016-06-02 NOTE — Discharge Instructions (Signed)
PLEASE SEE THE ORTHOPEDIST AS REQUESTED - toe xrays do show possible fracture.

## 2016-06-02 NOTE — ED Triage Notes (Signed)
Patient states he stepped on a nail a few weeks back and had swelling and pain then.   He states that since his antibiotics ran out, he is starting to swell again in his foot and ankle.

## 2016-06-02 NOTE — ED Notes (Signed)
Pt given additional sandwich and a Sprite.

## 2016-06-03 LAB — CBG MONITORING, ED
Glucose-Capillary: 43 mg/dL — CL (ref 65–99)
Glucose-Capillary: 66 mg/dL (ref 65–99)

## 2016-07-15 ENCOUNTER — Emergency Department (HOSPITAL_COMMUNITY)
Admission: EM | Admit: 2016-07-15 | Discharge: 2016-07-15 | Disposition: A | Discharge status: Home / Self Care | Payer: Self-pay | Attending: Emergency Medicine | Admitting: Emergency Medicine

## 2016-07-15 ENCOUNTER — Emergency Department (HOSPITAL_COMMUNITY): Payer: Self-pay

## 2016-07-15 ENCOUNTER — Encounter (HOSPITAL_COMMUNITY): Payer: Self-pay | Admitting: Emergency Medicine

## 2016-07-15 DIAGNOSIS — Y929 Unspecified place or not applicable: Secondary | ICD-10-CM | POA: Insufficient documentation

## 2016-07-15 DIAGNOSIS — E1040 Type 1 diabetes mellitus with diabetic neuropathy, unspecified: Secondary | ICD-10-CM | POA: Insufficient documentation

## 2016-07-15 DIAGNOSIS — Z8673 Personal history of transient ischemic attack (TIA), and cerebral infarction without residual deficits: Secondary | ICD-10-CM | POA: Insufficient documentation

## 2016-07-15 DIAGNOSIS — N182 Chronic kidney disease, stage 2 (mild): Secondary | ICD-10-CM | POA: Insufficient documentation

## 2016-07-15 DIAGNOSIS — E1142 Type 2 diabetes mellitus with diabetic polyneuropathy: Secondary | ICD-10-CM

## 2016-07-15 DIAGNOSIS — S91209A Unspecified open wound of unspecified toe(s) with damage to nail, initial encounter: Secondary | ICD-10-CM

## 2016-07-15 DIAGNOSIS — Y999 Unspecified external cause status: Secondary | ICD-10-CM | POA: Insufficient documentation

## 2016-07-15 DIAGNOSIS — I129 Hypertensive chronic kidney disease with stage 1 through stage 4 chronic kidney disease, or unspecified chronic kidney disease: Secondary | ICD-10-CM | POA: Insufficient documentation

## 2016-07-15 DIAGNOSIS — I10 Essential (primary) hypertension: Secondary | ICD-10-CM

## 2016-07-15 DIAGNOSIS — X58XXXA Exposure to other specified factors, initial encounter: Secondary | ICD-10-CM | POA: Insufficient documentation

## 2016-07-15 DIAGNOSIS — Y939 Activity, unspecified: Secondary | ICD-10-CM | POA: Insufficient documentation

## 2016-07-15 DIAGNOSIS — S91204A Unspecified open wound of right lesser toe(s) with damage to nail, initial encounter: Secondary | ICD-10-CM | POA: Insufficient documentation

## 2016-07-15 DIAGNOSIS — Z79899 Other long term (current) drug therapy: Secondary | ICD-10-CM | POA: Insufficient documentation

## 2016-07-15 DIAGNOSIS — S91109A Unspecified open wound of unspecified toe(s) without damage to nail, initial encounter: Secondary | ICD-10-CM

## 2016-07-15 DIAGNOSIS — E10610 Type 1 diabetes mellitus with diabetic neuropathic arthropathy: Secondary | ICD-10-CM

## 2016-07-15 LAB — CBC WITH DIFFERENTIAL/PLATELET
Basophils Absolute: 0 10*3/uL (ref 0.0–0.1)
Basophils Relative: 0 %
Eosinophils Absolute: 0 10*3/uL (ref 0.0–0.7)
Eosinophils Relative: 0 %
HCT: 29.8 % — ABNORMAL LOW (ref 39.0–52.0)
Hemoglobin: 9.3 g/dL — ABNORMAL LOW (ref 13.0–17.0)
Lymphocytes Relative: 22 %
Lymphs Abs: 1.3 10*3/uL (ref 0.7–4.0)
MCH: 23 pg — ABNORMAL LOW (ref 26.0–34.0)
MCHC: 31.2 g/dL (ref 30.0–36.0)
MCV: 73.8 fL — ABNORMAL LOW (ref 78.0–100.0)
Monocytes Absolute: 0.4 10*3/uL (ref 0.1–1.0)
Monocytes Relative: 7 %
Neutro Abs: 4.1 10*3/uL (ref 1.7–7.7)
Neutrophils Relative %: 71 %
Platelets: 299 10*3/uL (ref 150–400)
RBC: 4.04 MIL/uL — ABNORMAL LOW (ref 4.22–5.81)
RDW: 15.2 % (ref 11.5–15.5)
WBC: 5.9 10*3/uL (ref 4.0–10.5)

## 2016-07-15 LAB — BASIC METABOLIC PANEL
Anion gap: 6 (ref 5–15)
BUN: 24 mg/dL — ABNORMAL HIGH (ref 6–20)
CO2: 25 mmol/L (ref 22–32)
Calcium: 8.1 mg/dL — ABNORMAL LOW (ref 8.9–10.3)
Chloride: 104 mmol/L (ref 101–111)
Creatinine, Ser: 2.58 mg/dL — ABNORMAL HIGH (ref 0.61–1.24)
GFR calc Af Amer: 31 mL/min — ABNORMAL LOW (ref >60)
GFR calc non Af Amer: 27 mL/min — ABNORMAL LOW (ref >60)
Glucose, Bld: 287 mg/dL — ABNORMAL HIGH (ref 65–99)
Potassium: 5.1 mmol/L (ref 3.5–5.1)
Sodium: 135 mmol/L (ref 135–145)

## 2016-07-15 LAB — C-REACTIVE PROTEIN: CRP: <0.8 mg/dL (ref <1.0)

## 2016-07-15 LAB — CBG MONITORING, ED: Glucose-Capillary: 253 mg/dL — ABNORMAL HIGH (ref 65–99)

## 2016-07-15 LAB — SEDIMENTATION RATE: Sed Rate: 94 mm/hr — ABNORMAL HIGH (ref 0–16)

## 2016-07-15 LAB — I-STAT CG4 LACTIC ACID, ED: Lactic Acid, Venous: 1.08 mmol/L (ref 0.5–1.9)

## 2016-07-15 MED ORDER — GABAPENTIN 300 MG PO CAPS: 300.0000 mg | ORAL_CAPSULE | Freq: Three times a day (TID) | ORAL | 3 refills | Status: DC

## 2016-07-15 MED ORDER — INSULIN NPH ISOPHANE & REGULAR (70-30) 100 UNIT/ML ~~LOC~~ SUSP: 40.0000 [IU] | Freq: Every day | SUBCUTANEOUS | 2 refills | Status: DC

## 2016-07-15 MED ORDER — INSULIN ASPART 100 UNIT/ML ~~LOC~~ SOLN: SUBCUTANEOUS | 1 refills | Status: DC

## 2016-07-15 MED ORDER — DOXYCYCLINE HYCLATE 100 MG PO CAPS: 100.0000 mg | ORAL_CAPSULE | Freq: Two times a day (BID) | ORAL | 0 refills | Status: DC

## 2016-07-15 MED ORDER — AMLODIPINE BESYLATE 5 MG PO TABS: 5.0000 mg | ORAL_TABLET | Freq: Every day | ORAL | 3 refills | Status: DC

## 2016-07-15 MED ORDER — PRAVASTATIN SODIUM 40 MG PO TABS: 40.0000 mg | ORAL_TABLET | Freq: Every day | ORAL | 0 refills | Status: DC

## 2016-07-15 MED ORDER — LISINOPRIL 10 MG PO TABS: 10.0000 mg | ORAL_TABLET | Freq: Every day | ORAL | 3 refills | Status: DC

## 2016-07-15 MED ORDER — MORPHINE SULFATE (PF) 4 MG/ML IV SOLN
4.0000 mg | Freq: Once | INTRAVENOUS | Status: AC
  Administered 2016-07-15: 4 mg via INTRAVENOUS
  Filled 2016-07-15: qty 1

## 2016-07-15 MED ORDER — CEPHALEXIN 250 MG PO CAPS
500.0000 mg | ORAL_CAPSULE | Freq: Four times a day (QID) | ORAL | Status: DC
  Administered 2016-07-15: 500 mg via ORAL
  Filled 2016-07-15: qty 2

## 2016-07-15 MED ORDER — DOXYCYCLINE HYCLATE 100 MG PO TABS
100.0000 mg | ORAL_TABLET | Freq: Two times a day (BID) | ORAL | Status: DC
  Administered 2016-07-15: 100 mg via ORAL
  Filled 2016-07-15: qty 1

## 2016-07-15 MED ORDER — CEPHALEXIN 500 MG PO CAPS: 500.0000 mg | ORAL_CAPSULE | Freq: Four times a day (QID) | ORAL | 0 refills | Status: AC

## 2016-07-15 NOTE — ED Triage Notes (Signed)
Pt states that 2 days ago he took off his sock and discovered an ulceration on his right foot 3rd toe. Pt denies any traumatic injury and states he doesn't know how it happened

## 2016-07-15 NOTE — ED Notes (Signed)
Pt verbalizes understanding of importance of taking antibiotics and follow up with PCP and ortho. Pt given scripts for all home medications. NAD. A/o x4. Ambulatory at discharge.

## 2016-07-15 NOTE — ED Notes (Signed)
Nurse drawing labs. 

## 2016-07-15 NOTE — Discharge Instructions (Signed)
1. Medications: Keflex, doxycycline, refills of usual home medications 2. Treatment: rest, drink plenty of fluids,  3. Follow Up: Please followup with your primary doctor in 3 days for discussion of your diagnoses and further evaluation after today's visit; if you do not have a primary care doctor use the resource guide provided to find one; Please return to the ER for worsening symptoms, fever, chills, spreading infection or other concerns.
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
## 2016-07-20 LAB — CULTURE, BLOOD (ROUTINE X 2)
Culture: NO GROWTH
Culture: NO GROWTH

## 2016-07-21 ENCOUNTER — Ambulatory Visit: Payer: Self-pay | Admitting: Family Medicine

## 2016-07-29 ENCOUNTER — Ambulatory Visit: Payer: Self-pay | Admitting: Student

## 2017-05-31 ENCOUNTER — Encounter: Payer: Self-pay | Admitting: Internal Medicine

## 2017-05-31 ENCOUNTER — Ambulatory Visit (INDEPENDENT_AMBULATORY_CARE_PROVIDER_SITE_OTHER): Payer: Medicaid Other | Admitting: Internal Medicine

## 2017-05-31 ENCOUNTER — Other Ambulatory Visit: Payer: Self-pay

## 2017-05-31 VITALS — BP 140/60 | HR 82 | Temp 98.0°F | Ht 71.0 in | Wt 181.0 lb

## 2017-05-31 DIAGNOSIS — E1142 Type 2 diabetes mellitus with diabetic polyneuropathy: Secondary | ICD-10-CM

## 2017-05-31 DIAGNOSIS — E1165 Type 2 diabetes mellitus with hyperglycemia: Secondary | ICD-10-CM | POA: Diagnosis present

## 2017-05-31 DIAGNOSIS — N182 Chronic kidney disease, stage 2 (mild): Secondary | ICD-10-CM | POA: Diagnosis not present

## 2017-05-31 LAB — POCT GLYCOSYLATED HEMOGLOBIN (HGB A1C): Hemoglobin A1C: 14.3

## 2017-05-31 MED ORDER — GABAPENTIN 300 MG PO CAPS: 300.0000 mg | ORAL_CAPSULE | Freq: Three times a day (TID) | ORAL | 3 refills | Status: DC

## 2017-05-31 MED ORDER — PRAVASTATIN SODIUM 40 MG PO TABS: 40.0000 mg | ORAL_TABLET | Freq: Every day | ORAL | 0 refills | Status: DC

## 2017-05-31 MED ORDER — INSULIN NPH ISOPHANE & REGULAR (70-30) 100 UNIT/ML ~~LOC~~ SUSP: 10.0000 [IU] | Freq: Three times a day (TID) | SUBCUTANEOUS | 2 refills | Status: DC

## 2017-05-31 NOTE — Assessment & Plan Note (Signed)
Refilled gabapentin 300 mg 3 times daily

## 2017-05-31 NOTE — Assessment & Plan Note (Signed)
Will obtain BMP today

## 2017-05-31 NOTE — Progress Notes (Signed)
   [facility] Phone: [phone]   Date of Visit: 05/31/2017   HPI:  Uncontrolled type 2 diabetes: - Patient has not been seen in clinic since 2016.  He does not have insurance currently and is planning on signing up for the [facility] card. - Medications: 70/30 20 units daily in the morning -He reports he was taking 40 units a day initially but reports "that was too much".  Upon further questioning he reports that in the evening around 8 PM he would have lower sugars in the upper 80s.  And he would feel symptoms of hypoglycemia. -Reports that on average his sugars have been in the 300s.  He checks once a day in the morning fasting. -He reports he has significant peripheral neuropathy for which he was on gabapentin.  He would like a refill of this, -He does report of polyuria and polydipsia.  He denies any abdominal pain, nausea, vomiting   ROS: See HPI.  [place]:  Uncontrolled type 2 diabetes with peripheral neuropathy and diabetic retinopathy Medication noncompliance Hypertension CKD  PHYSICAL EXAM: BP 140/60   Pulse 82   Temp 98 F (36.7 C) (Oral)   Ht 5\' 11"  (1.803 m)   Wt 181 lb (82.1 kg)   SpO2 99%   BMI 25.24 kg/m  GEN: NAD CV: RRR, no murmurs, rubs, or gallops PULM: CTAB, normal effort ABD: Soft, nontender, nondistended, NABS, no organomegaly SKIN: No rash or cyanosis; warm and well-perfused EXTR: No lower extremity edema or calf tenderness PSYCH: Mood and affect euthymic, normal rate and volume of speech NEURO: Awake, alert  Diabetic Foot Exam - Simple   Simple Foot Form Diabetic Foot exam was performed with the following findings:  Yes 05/31/2017  1:35 PM  Visual Inspection No deformities, no ulcerations, no other skin breakdown bilaterally:  Yes Sensation Testing See comments:  Yes Pulse Check See comments:  Yes Comments Monofilament testing was inconsistent.  It seems that he has significant peripheral neuropathy.  Dorsalis pedis  pulses intact bilaterally unable to palpate posterior tibialis pulses.  Feet are warm with good capillary refill.      ASSESSMENT/PLAN:  Health maintenance:  -Patient unable to get his flu vaccine today because he does not have insurance -Asked him to start taking baby aspirin (he reports he was on this in the past)  Poorly controlled type 2 diabetes mellitus Will order hemoglobin A1c today.  We will place him on insulin 70/30 10 units 3 times daily with meals.  Patient to check his sugars 3 times a day before meals and keep a log.  Patient to call me in 1 week to review sugars.  Patient to follow-up with PCP in 2 weeks.  Will obtain a BMP today.  We discussed appropriate diet changes.  CKD (chronic kidney disease) stage 2, GFR 60-89 ml/min Will obtain BMP today  Diabetic peripheral neuropathy Refilled gabapentin 300 mg 3 times daily   Smiley Houseman, MD PGY [place]

## 2017-05-31 NOTE — Patient Instructions (Addendum)
  Diet Recommendations for Diabetes   Starchy (carb) foods include: Bread, rice, pasta, potatoes, corn, crackers, bagels, muffins, all baked goods.  (Fruits, milk, and yogurt also have carbohydrate, but most of these foods will not spike your blood sugar as the starchy foods will.)  A few fruits do cause high blood sugars; use small portions of bananas (limit to 1/2 at a time), grapes, and most tropical fruits.    Protein foods include: Meat, fish, poultry, eggs, dairy foods, and beans such as pinto and kidney beans (beans also provide carbohydrate).   1. Eat at least 3 meals and 1-2 snacks per day. Never go more than 4-5 hours while awake without eating.  2. Limit starchy foods to TWO per meal and ONE per snack. ONE portion of a starchy  food is equal to the following:   - ONE slice of bread (or its equivalent, such as half of a hamburger bun).   - 1/2 cup of a "scoopable" starchy food such as potatoes or rice.   - 15 grams of carbohydrate as shown on food label.  3. Both lunch and dinner should include a protein food, a carb food, and vegetables.   - Obtain twice as many veg's as protein or carbohydrate foods for both lunch and dinner.   - Fresh or frozen veg's are best.   - Try to keep frozen veg's on hand for a quick vegetable serving.    4. Breakfast should always include protein.     Please take 70/30 insulin 10 units three times a day with meals.  Your sugar should be around 130-160 Please record your sugars three times a day and bring the log in at the next appointment.  Follow up in clinic with PCP in 2 weeks.  You can call me in 1 week to review your sugars.

## 2017-05-31 NOTE — Assessment & Plan Note (Addendum)
Will order hemoglobin A1c today.  We will place him on insulin 70/30 10 units 3 times daily with meals.  Patient to check his sugars 3 times a day before meals and keep a log.  Patient to call me in 1 week to review sugars.  Patient to follow-up with PCP in 2 weeks.  Will obtain a BMP today.  We discussed appropriate diet changes.

## 2017-06-01 LAB — BASIC METABOLIC PANEL
BUN/Creatinine Ratio: 11 (ref 9–20)
BUN: 30 mg/dL — ABNORMAL HIGH (ref 6–24)
CO2: 23 mmol/L (ref 20–29)
Calcium: 8.2 mg/dL — ABNORMAL LOW (ref 8.7–10.2)
Chloride: 111 mmol/L — ABNORMAL HIGH (ref 96–106)
Creatinine, Ser: 2.69 mg/dL — ABNORMAL HIGH (ref 0.76–1.27)
GFR calc Af Amer: 30 mL/min/{1.73_m2} — ABNORMAL LOW (ref >59)
GFR calc non Af Amer: 26 mL/min/{1.73_m2} — ABNORMAL LOW (ref >59)
Glucose: 97 mg/dL (ref 65–99)
Potassium: 4.8 mmol/L (ref 3.5–5.2)
Sodium: 146 mmol/L — ABNORMAL HIGH (ref 134–144)

## 2017-06-06 ENCOUNTER — Telehealth: Payer: Self-pay | Admitting: Internal Medicine

## 2017-06-06 NOTE — Telephone Encounter (Signed)
Attempted to call to review cbgs and discuss lab results. Home phone does not have voicemail set up. Cell number went to voicemail. Left message to call back and if possible to provide a time that fits best with his schedule for me to call back.

## 2017-06-12 ENCOUNTER — Telehealth: Payer: Self-pay | Admitting: Internal Medicine

## 2017-06-12 NOTE — Telephone Encounter (Signed)
Attempted to call patient. Home phone with full mailbox. Called mobile phone and family member picked up. He was not home currently. I asked if she could give pt a message to call back clinic.

## 2017-06-21 ENCOUNTER — Ambulatory Visit: Payer: Self-pay | Admitting: Family Medicine

## 2017-07-25 LAB — HM DIABETES EYE EXAM

## 2017-07-26 ENCOUNTER — Telehealth: Payer: Self-pay | Admitting: Family Medicine

## 2017-07-26 NOTE — Telephone Encounter (Signed)
Assuming the labs from 12/5, Gunadasa attempted to call pt to discuss, had no luck reaching pt. Please advise.

## 2017-07-26 NOTE — Telephone Encounter (Signed)
Will forward to MD to advise. Jazmin Hartsell,CMA  

## 2017-07-26 NOTE — Telephone Encounter (Signed)
Needs lab results.  Please call him at [phone]

## 2017-07-27 ENCOUNTER — Ambulatory Visit: Payer: Self-pay | Admitting: Pharmacist

## 2017-07-27 NOTE — Telephone Encounter (Signed)
Patient needs appointment given worsening labs and he needs to bring home CBG log to be reviewed.

## 2017-07-27 NOTE — Telephone Encounter (Signed)
I was speaking to my patient Ms. Jeremiah [name] over the phone and she reported that Mr. Jeremiah [name] wanted to speak. It looks like we have been trying to get in touch with him to discuss his uncontrolled DM2 but have had a difficult time reaching him. I told him about the elevated A1c. At the last visit in 12/5 he was started on 70/30 10 units TID and was asked to follow up in 1 week. He reports that with this regimen, he had symptoms of hypoglycemia when his sugars reached 125. Therefore he changed his dose to 70/30 20 units in the AM, 10 units PM. He reports his sugars are still elevated, for example it was 376 this morning. He is unable to come to clinic today, but I made him an appointment with Dr. Valentina Lucks tomorrow at 8:30AM. Patient aware and will be able to come.   Will forward to PCP to inform.

## 2017-07-28 ENCOUNTER — Ambulatory Visit: Payer: Self-pay | Admitting: Pharmacist

## 2017-09-18 ENCOUNTER — Encounter: Payer: Self-pay | Admitting: Family Medicine

## 2017-09-27 NOTE — Progress Notes (Addendum)
   [facility] Phone: [phone]   Date of Visit: 09/28/2017   HPI: Last seen in 05/2017 .  HTN:  - has not been on his medication in a year. At a certain point, he was not able to afford it. Per chart review, the most recent medication he was on was Norvasc.  - he was seen by his eye doctor in clinic last week. His blood pressure at that time was 190/120. He was told to go to PCP ASAP. He reports that on that morning, he did not eat or take his morning insulin so he was not feeling well. After he went back home, ate and took his insulin, he started to feel better. However, he did not recheck his blood pressure.  - no chest pain. Has been intermittently short of breath. Has had some intermittent headaches. No blurred vision. No focal weakness, numbness, or tingling - no added salt in diet - does not have a way to check his blood pressure at home  DM2:  - has not been checking his sugar for the past week as he could not afford strips - He has been taking 70/30 20 units in the AM. He tried to do 20 units BID but he reported of symptoms of hypoglycemia. He did not check his sugars during these times - when we last met in clinic, we decided to do 70/30 10 units TID. He reported that the third dose caused him to have symptoms of hypoglycemia. He was supposed to follow up with Dr. Valentina Lucks, but missed his appointment.  HLD: - has not been taking Pravastatin. Reports it is too expensive.  - has not been taking ASA   ROS: See HPI.  [place]:  PMH: HTN Hx Stroke DM2 uncontrolled with neuropathy CKD 2 HLD  PHYSICAL EXAM: BP (!) 144/70   Pulse 93   Temp 97.9 F (36.6 C) (Oral)   Ht _0  (1.803 m)   Wt 165 lb (74.8 kg)   SpO2 99%   BMI 23.01 kg/m  GEN: NAD HEENT: Atraumatic, normocephalic, neck supple, EOMI, sclera clear  CV: RRR, no murmurs, rubs, or gallops PULM: CTAB, normal effort SKIN: No rash or cyanosis; warm and well-perfused EXTR: No lower extremity  edema or calf tenderness PSYCH: Mood and affect euthymic, normal rate and volume of speech NEURO: Awake, alert, no focal deficits grossly, normal speech   ASSESSMENT/PLAN:   Hypertension Patient's blood pressure is pretty good at 144/70 today. Unclear why he had the elevated BP at the eye doctor last week. Hesitant to restart Norvasc today due to normal diastolic BP. At the last visit, he had BP of 140/60. He will follow up with me on Monday.   Poorly controlled type 2 diabetes mellitus A1c > 15. I think patient has some difficulty with following up due to financial difficulties. He has not checked his cbg in a week. He did buy test strips today. Will do the following insulin regimen: 70/30 10 units AM, 10units at lunch, and 5 units at dinner. We discussed the importance of keeping a cbg log. He has an appointment with me on Monday. BMP today   HLD (hyperlipidemia) Will switch to Lipitor as this is less expensive at [place]. Also sent ASA 72m daily.   Financial difficulties Currently does not have insurance. Is signing up for orange card. Provided information about MAP program.    KSmiley Houseman MD PGY 3[place]

## 2017-09-28 ENCOUNTER — Other Ambulatory Visit: Payer: Self-pay

## 2017-09-28 ENCOUNTER — Encounter: Payer: Self-pay | Admitting: Internal Medicine

## 2017-09-28 ENCOUNTER — Ambulatory Visit (INDEPENDENT_AMBULATORY_CARE_PROVIDER_SITE_OTHER): Payer: Medicaid Other | Admitting: Internal Medicine

## 2017-09-28 VITALS — BP 144/70 | HR 93 | Temp 97.9°F | Ht 71.0 in | Wt 165.0 lb

## 2017-09-28 DIAGNOSIS — E1165 Type 2 diabetes mellitus with hyperglycemia: Secondary | ICD-10-CM | POA: Diagnosis not present

## 2017-09-28 DIAGNOSIS — E785 Hyperlipidemia, unspecified: Secondary | ICD-10-CM

## 2017-09-28 DIAGNOSIS — D649 Anemia, unspecified: Secondary | ICD-10-CM | POA: Diagnosis not present

## 2017-09-28 DIAGNOSIS — Z598 Other problems related to housing and economic circumstances: Secondary | ICD-10-CM | POA: Diagnosis not present

## 2017-09-28 DIAGNOSIS — I10 Essential (primary) hypertension: Secondary | ICD-10-CM | POA: Diagnosis not present

## 2017-09-28 DIAGNOSIS — Z599 Problem related to housing and economic circumstances, unspecified: Secondary | ICD-10-CM | POA: Insufficient documentation

## 2017-09-28 DIAGNOSIS — E118 Type 2 diabetes mellitus with unspecified complications: Secondary | ICD-10-CM | POA: Diagnosis not present

## 2017-09-28 DIAGNOSIS — IMO0002 Reserved for concepts with insufficient information to code with codable children: Secondary | ICD-10-CM

## 2017-09-28 LAB — POCT GLYCOSYLATED HEMOGLOBIN (HGB A1C): Hemoglobin A1C: >15

## 2017-09-28 MED ORDER — ASPIRIN EC 81 MG PO TBEC: 81.0000 mg | DELAYED_RELEASE_TABLET | Freq: Every day | ORAL | 2 refills | Status: DC

## 2017-09-28 MED ORDER — ATORVASTATIN CALCIUM 40 MG PO TABS: 40.0000 mg | ORAL_TABLET | Freq: Every day | ORAL | 3 refills | Status: DC

## 2017-09-28 NOTE — Assessment & Plan Note (Signed)
Patient's blood pressure is pretty good at 144/70 today. Unclear why he had the elevated BP at the eye doctor last week. Hesitant to restart Norvasc today due to normal diastolic BP. At the last visit, he had BP of 140/60. He will follow up with me on Monday.

## 2017-09-28 NOTE — Assessment & Plan Note (Signed)
Will switch to Lipitor as this is less expensive at [place]. Also sent ASA 81mg  daily.

## 2017-09-28 NOTE — Patient Instructions (Addendum)
Take 70/30 in the following way: 10 units in the morning, 10 units before lunch, and 5 units before dinner.  Check your sugar three times a day and write it down.   We started you Lipitor for cholesterol today  We restart a baby aspirin today   Please come back on Monday for diabetes and blood pressure

## 2017-09-28 NOTE — Assessment & Plan Note (Signed)
Currently does not have insurance. Is signing up for orange card. Provided information about MAP program.

## 2017-09-28 NOTE — Assessment & Plan Note (Signed)
A1c > 15. I think patient has some difficulty with following up due to financial difficulties. He has not checked his cbg in a week. He did buy test strips today. Will do the following insulin regimen: 70/30 10 units AM, 10units at lunch, and 5 units at dinner. We discussed the importance of keeping a cbg log. He has an appointment with me on Monday. BMP today

## 2017-09-29 ENCOUNTER — Telehealth: Payer: Self-pay | Admitting: Internal Medicine

## 2017-09-29 LAB — CBC
Hematocrit: 32.9 % — ABNORMAL LOW (ref 37.5–51.0)
Hemoglobin: 10.1 g/dL — ABNORMAL LOW (ref 13.0–17.7)
MCH: 23 pg — ABNORMAL LOW (ref 26.6–33.0)
MCHC: 30.7 g/dL — ABNORMAL LOW (ref 31.5–35.7)
MCV: 75 fL — ABNORMAL LOW (ref 79–97)
Platelets: 314 10*3/uL (ref 150–379)
RBC: 4.39 x10E6/uL (ref 4.14–5.80)
RDW: 15.8 % — ABNORMAL HIGH (ref 12.3–15.4)
WBC: 7.5 10*3/uL (ref 3.4–10.8)

## 2017-09-29 LAB — BASIC METABOLIC PANEL
BUN/Creatinine Ratio: 12 (ref 9–20)
BUN: 39 mg/dL — ABNORMAL HIGH (ref 6–24)
CO2: 23 mmol/L (ref 20–29)
Calcium: 8.7 mg/dL (ref 8.7–10.2)
Chloride: 98 mmol/L (ref 96–106)
Creatinine, Ser: 3.38 mg/dL — ABNORMAL HIGH (ref 0.76–1.27)
GFR calc Af Amer: 23 mL/min/{1.73_m2} — ABNORMAL LOW (ref >59)
GFR calc non Af Amer: 20 mL/min/{1.73_m2} — ABNORMAL LOW (ref >59)
Glucose: 424 mg/dL — ABNORMAL HIGH (ref 65–99)
Potassium: 4.8 mmol/L (ref 3.5–5.2)
Sodium: 136 mmol/L (ref 134–144)

## 2017-09-29 NOTE — Telephone Encounter (Signed)
Attempted to call home phone number to discuss results. But continued to ring then disconnected. Called mobile phone and left message to call back

## 2017-10-01 NOTE — Progress Notes (Deleted)
   [facility] Phone: [phone]   Date of Visit: 10/02/2017   HPI:  ***  ROS: See HPI.  [place]: ***  PHYSICAL EXAM: There were no vitals taken for this visit. Gen: *** HEENT: *** Heart: *** Lungs: *** Neuro: *** Ext: ***  ASSESSMENT/PLAN:  Health maintenance:  -***  No problem-specific Assessment & Plan notes found for this encounter.  FOLLOW UP: Follow up in *** for ***  Smiley Houseman, MD PGY [place]

## 2017-10-02 ENCOUNTER — Ambulatory Visit: Payer: Self-pay | Admitting: Internal Medicine

## 2017-10-04 ENCOUNTER — Telehealth: Payer: Self-pay

## 2017-10-04 NOTE — Telephone Encounter (Signed)
Heather with [facility] calling for patients most recent A1C. Advised on 4.4.19 his A1C was >15. Wallace Cullens, RN

## 2017-10-05 ENCOUNTER — Encounter: Payer: Self-pay | Admitting: Pharmacist

## 2017-10-05 ENCOUNTER — Ambulatory Visit (INDEPENDENT_AMBULATORY_CARE_PROVIDER_SITE_OTHER): Payer: Medicaid Other | Admitting: Pharmacist

## 2017-10-05 DIAGNOSIS — E1165 Type 2 diabetes mellitus with hyperglycemia: Secondary | ICD-10-CM | POA: Diagnosis not present

## 2017-10-05 MED ORDER — INSULIN DEGLUDEC 100 UNIT/ML ~~LOC~~ SOLN: 16.0000 [IU] | Freq: Every day | SUBCUTANEOUS | 0 refills | Status: DC

## 2017-10-05 MED ORDER — INSULIN DEGLUDEC 100 UNIT/ML ~~LOC~~ SOLN: 16.0000 [IU] | Freq: Every day | SUBCUTANEOUS | 6 refills | Status: DC

## 2017-10-05 MED ORDER — LIRAGLUTIDE 18 MG/3ML ~~LOC~~ SOPN: PEN_INJECTOR | SUBCUTANEOUS | 0 refills | Status: DC

## 2017-10-05 MED ORDER — BLOOD GLUCOSE MONITOR KIT: PACK | 6 refills | Status: DC

## 2017-10-05 MED ORDER — LIRAGLUTIDE 18 MG/3ML ~~LOC~~ SOPN: PEN_INJECTOR | SUBCUTANEOUS | 6 refills | Status: DC

## 2017-10-05 NOTE — Assessment & Plan Note (Signed)
Diabetes longstanding currently severely uncontrolled. Patient reports hypoglycemic events and is able to verbalize appropriate hypoglycemia management plan. Patient reports adherence with medication. Control is suboptimal due to lack of medication access barriers 2/2 lack of insurance coverage, dietary indiscretion, and current non-optimal medication therapy. Could consider addition of metformin therapy at next clinic visit. Discontinued Insulin NPH 70/30 - premix. Initiated Tresiba (insulin degludec) 16 units once daily in AM. Initiated Victoza (liraglutide) 0.6 mg once daily. Goal to up-titrate Victoza at next clinic visit to at least 1.2 mg once daily. Provided patient with samples and counseled on ways to apply for the [place] to get future medications covered. Advised patient to continue to check blood glucose multiple times a day at home.

## 2017-10-05 NOTE — Progress Notes (Signed)
    S:     Chief Complaint  Patient presents with  . Medication Management    T2DM    Patient arrives in good spirits and ambulating independently.  Presents for diabetes evaluation, education, and management at the request of Dr. Dallas Schimke. Patient was referred on 09/28/17.  Patient was last seen by Primary Care Provider on 09/28/17. Currently uninsured and pays out of pocket for his medications. Has not gotten orange card yet.    Patient reports T2DM was diagnosed 16 years ago.  Family/Social History: Mother passed with DM and required dialysis  Patient reports adherence with medications.  Current diabetes medications include: Insulin NPH 70/30 isophane & regular 10 units three times daily with meals. However, pt reports only taking 20 units prior to breakfast once daily.   Current hypertension medications include: none.  Patient reports recent hypoglycemic events where he felt "confused and lost" which occurred around 1 - 2 PM. Attributes recent hypoglycemic events to his recent increase in insulin 70/30 dose frequency to three times daily.   Patient reported dietary habits: Eats 3 meals/day Breakfast: bacon eggs toast OJ Dinner: burger, fries, drink  Drinks: juice (sometimes cuts juice with water), regular soda Patient reported exercise habits: No intentional exercise but reports walking around in the house.    Patient reports nocturia up to 3-4x times a night. Patient reports neuropathy. Numbness in both hands. Reports neuropathy in legs below the knees; tingly, numbness, and pain. Patient reports he receives monthly eye exams. Reports recent visual changes including feelings of light flashes in his periphery.  Patient reports self foot exams with no ulcerations, cuts, or broken skin.   O:  Lab Results  Component Value Date   HGBA1C >15.0 09/28/2017   Vitals:   10/05/17 1512  BP: 138/82  Pulse: 75  SpO2: 99%    Home fasting CBG: ~300 mg/dL  2 hour post-prandial/random  CBG: ~480+ mg/dL. BG values recently when feeling hypoglycemic sx: ~70 mg/dL  10 year ASCVD risk: 21.3% -(Lipid values from 2017)  A/P: Diabetes longstanding currently severely uncontrolled. Patient reports hypoglycemic events and is able to verbalize appropriate hypoglycemia management plan. Patient reports adherence with medication. Control is suboptimal due to lack of medication access barriers 2/2 lack of insurance coverage, dietary indiscretion, and current non-optimal medication therapy. Could consider addition of metformin therapy at next clinic visit. Discontinued Insulin NPH 70/30 - premix. Initiated Tresiba (insulin degludec) 16 units once daily in AM. Initiated Victoza (liraglutide) 0.6 mg once daily. Goal to up-titrate Victoza at next clinic visit to at least 1.2 mg once daily. Provided patient with samples and counseled on ways to apply for the [place] to get future medications covered. Advised patient to continue to check blood glucose multiple times a day at home.  Next A1C anticipated July 2019.    ASCVD risk greater than 7.5%. Continued Aspirin 81 mg and Continued atorvastatin 81 mg. Patient has not started these medications but plans to pick them up at the pharmacy this weekend.   Hypertension longstanding currently uncontrolled and untreated. Due to multiple medication changes for T2DM today, HTN medication initiation was delayed until the next visit. Confirm HTN BP (>130/80 mmHg) at next visit.   Written patient instructions provided.  Total time in face to face counseling 45 minutes.   Follow up in Pharmacist Clinic Visit 3 - 4 weeks. Patient seen with Hildred Alamin, PharmD Candidate and Deirdre Pippins, PGY2 Pharmacy Resident, PharmD, BCPS.

## 2017-10-05 NOTE — Patient Instructions (Signed)
Thanks for coming in to see [place]. We are making several changes.   You need to make an appointment to see Kennyth Lose to discuss enrolling in the "orange card" program.   [facility] [place]. You need to go there to enroll in the MAP program and they will apply to drug companies with the hopes that they will send you free medications.   1. STOP the Novolin 70/30 (cloudy insulin)  2. START Tresiba 16 units under the skin once daily. This is your new insulin.   3. START Victoza 0.6 units under the skin once daily. This is NOT an insulin, but it is replacing a hormone that helps even out your blood sugars.   Take your blood sugar first thing in the morning before food and 1-2 hours after you have eaten a large meal.   Come back to see [place] in ~ 3-4 weeks

## 2017-10-05 NOTE — Progress Notes (Signed)
Patient ID: Jeremiah [name], male   DOB: Dec 17, 1965, 52 y.o.   MRN: [id number] Reviewed: Agree with Dr. Graylin Shiver documentation and management.

## 2017-10-16 ENCOUNTER — Other Ambulatory Visit: Payer: Self-pay | Admitting: *Deleted

## 2017-10-16 NOTE — Telephone Encounter (Signed)
Fax from pharmacy is requesting clarification on patient's triseba script.  Is he getting the vial or the pen?  Will forward to MD and Dr. Valentina Lucks to advise . Jazmin Hartsell,CMA

## 2017-10-17 MED ORDER — INSULIN DEGLUDEC 100 UNIT/ML ~~LOC~~ SOPN: 16.0000 [IU] | PEN_INJECTOR | Freq: Every day | SUBCUTANEOUS | 3 refills | Status: DC

## 2017-10-30 ENCOUNTER — Telehealth: Payer: Self-pay

## 2017-10-30 NOTE — Telephone Encounter (Signed)
Pt came in requesting samples of Tresiba, not sure if we have this medication in clinic? Ok to provide if so? Looks like he was given samples of this 4/11, as he is self pay. Please advise.

## 2017-10-30 NOTE — Telephone Encounter (Signed)
Yes an additional sample of Jeremiah [name] is appropriate at this time as patient appears to be in process of applying for orange card.

## 2017-11-01 NOTE — Telephone Encounter (Signed)
Pt contacted and informed we will provide samples. Please let me know when he arrives and I will get the samples from the fridge. Thanks!

## 2017-11-01 NOTE — Telephone Encounter (Signed)
I gave pt #1 Tresiba sample. Lot [id number] Ex: 08/25/2019

## 2018-01-19 ENCOUNTER — Emergency Department (HOSPITAL_COMMUNITY): Payer: Medicaid Other

## 2018-01-19 ENCOUNTER — Encounter (HOSPITAL_COMMUNITY): Payer: Self-pay | Admitting: Emergency Medicine

## 2018-01-19 ENCOUNTER — Inpatient Hospital Stay (HOSPITAL_COMMUNITY)
Admission: EM | Admit: 2018-01-19 | Discharge: 2018-01-23 | DRG: 304 | Disposition: A | Discharge status: Home / Self Care | Payer: Medicaid Other | Attending: Family Medicine | Admitting: Family Medicine

## 2018-01-19 ENCOUNTER — Other Ambulatory Visit: Payer: Self-pay

## 2018-01-19 ENCOUNTER — Inpatient Hospital Stay (HOSPITAL_COMMUNITY): Payer: Medicaid Other

## 2018-01-19 DIAGNOSIS — Z7982 Long term (current) use of aspirin: Secondary | ICD-10-CM

## 2018-01-19 DIAGNOSIS — Z794 Long term (current) use of insulin: Secondary | ICD-10-CM

## 2018-01-19 DIAGNOSIS — N183 Chronic kidney disease, stage 3 unspecified: Secondary | ICD-10-CM

## 2018-01-19 DIAGNOSIS — E7800 Pure hypercholesterolemia, unspecified: Secondary | ICD-10-CM | POA: Diagnosis present

## 2018-01-19 DIAGNOSIS — E1122 Type 2 diabetes mellitus with diabetic chronic kidney disease: Secondary | ICD-10-CM | POA: Diagnosis present

## 2018-01-19 DIAGNOSIS — N184 Chronic kidney disease, stage 4 (severe): Secondary | ICD-10-CM | POA: Diagnosis present

## 2018-01-19 DIAGNOSIS — I161 Hypertensive emergency: Principal | ICD-10-CM | POA: Diagnosis present

## 2018-01-19 DIAGNOSIS — I129 Hypertensive chronic kidney disease with stage 1 through stage 4 chronic kidney disease, or unspecified chronic kidney disease: Secondary | ICD-10-CM | POA: Diagnosis present

## 2018-01-19 DIAGNOSIS — Z8249 Family history of ischemic heart disease and other diseases of the circulatory system: Secondary | ICD-10-CM

## 2018-01-19 DIAGNOSIS — Z79899 Other long term (current) drug therapy: Secondary | ICD-10-CM

## 2018-01-19 DIAGNOSIS — Z833 Family history of diabetes mellitus: Secondary | ICD-10-CM

## 2018-01-19 DIAGNOSIS — D509 Iron deficiency anemia, unspecified: Secondary | ICD-10-CM | POA: Diagnosis present

## 2018-01-19 DIAGNOSIS — Z8673 Personal history of transient ischemic attack (TIA), and cerebral infarction without residual deficits: Secondary | ICD-10-CM | POA: Diagnosis not present

## 2018-01-19 DIAGNOSIS — R011 Cardiac murmur, unspecified: Secondary | ICD-10-CM | POA: Diagnosis present

## 2018-01-19 DIAGNOSIS — E785 Hyperlipidemia, unspecified: Secondary | ICD-10-CM | POA: Diagnosis present

## 2018-01-19 DIAGNOSIS — E11311 Type 2 diabetes mellitus with unspecified diabetic retinopathy with macular edema: Secondary | ICD-10-CM | POA: Diagnosis present

## 2018-01-19 DIAGNOSIS — Z9114 Patient's other noncompliance with medication regimen: Secondary | ICD-10-CM

## 2018-01-19 DIAGNOSIS — E1165 Type 2 diabetes mellitus with hyperglycemia: Secondary | ICD-10-CM | POA: Diagnosis present

## 2018-01-19 DIAGNOSIS — E119 Type 2 diabetes mellitus without complications: Secondary | ICD-10-CM

## 2018-01-19 DIAGNOSIS — J181 Lobar pneumonia, unspecified organism: Secondary | ICD-10-CM | POA: Diagnosis present

## 2018-01-19 DIAGNOSIS — E1142 Type 2 diabetes mellitus with diabetic polyneuropathy: Secondary | ICD-10-CM | POA: Diagnosis present

## 2018-01-19 DIAGNOSIS — I422 Other hypertrophic cardiomyopathy: Secondary | ICD-10-CM

## 2018-01-19 DIAGNOSIS — N189 Chronic kidney disease, unspecified: Secondary | ICD-10-CM

## 2018-01-19 LAB — CBC
HCT: 27.9 % — ABNORMAL LOW (ref 39.0–52.0)
Hemoglobin: 8.7 g/dL — ABNORMAL LOW (ref 13.0–17.0)
MCH: 23.6 pg — ABNORMAL LOW (ref 26.0–34.0)
MCHC: 31.2 g/dL (ref 30.0–36.0)
MCV: 75.6 fL — ABNORMAL LOW (ref 78.0–100.0)
Platelets: 268 10*3/uL (ref 150–400)
RBC: 3.69 MIL/uL — ABNORMAL LOW (ref 4.22–5.81)
RDW: 14.6 % (ref 11.5–15.5)
WBC: 3.8 10*3/uL — ABNORMAL LOW (ref 4.0–10.5)

## 2018-01-19 LAB — PROTIME-INR
INR: 1.02
Prothrombin Time: 13.4 seconds (ref 11.4–15.2)

## 2018-01-19 LAB — APTT: aPTT: 38 seconds — ABNORMAL HIGH (ref 24–36)

## 2018-01-19 LAB — DIFFERENTIAL
Abs Immature Granulocytes: 0 10*3/uL (ref 0.0–0.1)
Basophils Absolute: 0 10*3/uL (ref 0.0–0.1)
Basophils Relative: 1 %
Eosinophils Absolute: 0 10*3/uL (ref 0.0–0.7)
Eosinophils Relative: 0 %
Immature Granulocytes: 0 %
Lymphocytes Relative: 14 %
Lymphs Abs: 0.5 10*3/uL — ABNORMAL LOW (ref 0.7–4.0)
Monocytes Absolute: 0.5 10*3/uL (ref 0.1–1.0)
Monocytes Relative: 13 %
Neutro Abs: 2.8 10*3/uL (ref 1.7–7.7)
Neutrophils Relative %: 72 %

## 2018-01-19 LAB — I-STAT TROPONIN, ED: Troponin i, poc: 0.02 ng/mL (ref 0.00–0.08)

## 2018-01-19 LAB — I-STAT CHEM 8, ED
BUN: 27 mg/dL — ABNORMAL HIGH (ref 6–20)
Calcium, Ion: 1.1 mmol/L — ABNORMAL LOW (ref 1.15–1.40)
Chloride: 102 mmol/L (ref 98–111)
Creatinine, Ser: 4.1 mg/dL — ABNORMAL HIGH (ref 0.61–1.24)
Glucose, Bld: 247 mg/dL — ABNORMAL HIGH (ref 70–99)
HCT: 24 % — ABNORMAL LOW (ref 39.0–52.0)
Hemoglobin: 8.2 g/dL — ABNORMAL LOW (ref 13.0–17.0)
Potassium: 4.1 mmol/L (ref 3.5–5.1)
Sodium: 134 mmol/L — ABNORMAL LOW (ref 135–145)
TCO2: 23 mmol/L (ref 22–32)

## 2018-01-19 LAB — COMPREHENSIVE METABOLIC PANEL
ALT: 11 U/L (ref 0–44)
AST: 18 U/L (ref 15–41)
Albumin: 2.1 g/dL — ABNORMAL LOW (ref 3.5–5.0)
Alkaline Phosphatase: 80 U/L (ref 38–126)
Anion gap: 8 (ref 5–15)
BUN: 28 mg/dL — ABNORMAL HIGH (ref 6–20)
CO2: 23 mmol/L (ref 22–32)
Calcium: 8.1 mg/dL — ABNORMAL LOW (ref 8.9–10.3)
Chloride: 103 mmol/L (ref 98–111)
Creatinine, Ser: 3.83 mg/dL — ABNORMAL HIGH (ref 0.61–1.24)
GFR calc Af Amer: 19 mL/min — ABNORMAL LOW (ref >60)
GFR calc non Af Amer: 17 mL/min — ABNORMAL LOW (ref >60)
Glucose, Bld: 251 mg/dL — ABNORMAL HIGH (ref 70–99)
Potassium: 4.2 mmol/L (ref 3.5–5.1)
Sodium: 134 mmol/L — ABNORMAL LOW (ref 135–145)
Total Bilirubin: 0.4 mg/dL (ref 0.3–1.2)
Total Protein: 5.3 g/dL — ABNORMAL LOW (ref 6.5–8.1)

## 2018-01-19 LAB — RAPID HIV SCREEN (HIV 1/2 AB+AG)
HIV 1/2 Antibodies: NONREACTIVE
HIV-1 P24 Antigen - HIV24: NONREACTIVE

## 2018-01-19 LAB — I-STAT CG4 LACTIC ACID, ED: Lactic Acid, Venous: 0.58 mmol/L (ref 0.5–1.9)

## 2018-01-19 LAB — CBG MONITORING, ED: Glucose-Capillary: 149 mg/dL — ABNORMAL HIGH (ref 70–99)

## 2018-01-19 LAB — GLUCOSE, CAPILLARY: Glucose-Capillary: 190 mg/dL — ABNORMAL HIGH (ref 70–99)

## 2018-01-19 MED ORDER — AMLODIPINE BESYLATE 5 MG PO TABS
5.0000 mg | ORAL_TABLET | Freq: Every day | ORAL | Status: DC
  Administered 2018-01-20: 5 mg via ORAL
  Filled 2018-01-19: qty 1

## 2018-01-19 MED ORDER — HYDRALAZINE HCL 20 MG/ML IJ SOLN: 2.0000 mg | INTRAMUSCULAR | Status: DC | PRN

## 2018-01-19 MED ORDER — LABETALOL HCL 5 MG/ML IV SOLN
20.0000 mg | Freq: Once | INTRAVENOUS | Status: AC
  Administered 2018-01-19: 20 mg via INTRAVENOUS
  Filled 2018-01-19: qty 4

## 2018-01-19 MED ORDER — ACETAMINOPHEN 500 MG PO TABS
1000.0000 mg | ORAL_TABLET | Freq: Once | ORAL | Status: AC
  Administered 2018-01-19: 1000 mg via ORAL
  Filled 2018-01-19: qty 2

## 2018-01-19 MED ORDER — ENOXAPARIN SODIUM 30 MG/0.3ML ~~LOC~~ SOLN
30.0000 mg | SUBCUTANEOUS | Status: DC
  Administered 2018-01-20 – 2018-01-21 (×3): 30 mg via SUBCUTANEOUS
  Filled 2018-01-19 (×4): qty 0.3

## 2018-01-19 MED ORDER — ACETAMINOPHEN 325 MG PO TABS
650.0000 mg | ORAL_TABLET | Freq: Four times a day (QID) | ORAL | Status: DC | PRN
Start: 2018-01-19 — End: 2018-01-23
  Administered 2018-01-20: 650 mg via ORAL
  Filled 2018-01-19: qty 2

## 2018-01-19 MED ORDER — ASPIRIN EC 81 MG PO TBEC
81.0000 mg | DELAYED_RELEASE_TABLET | Freq: Every day | ORAL | Status: DC
  Administered 2018-01-20 – 2018-01-23 (×4): 81 mg via ORAL
  Filled 2018-01-19 (×4): qty 1

## 2018-01-19 MED ORDER — ACETAMINOPHEN 650 MG RE SUPP
650.0000 mg | Freq: Four times a day (QID) | RECTAL | Status: DC | PRN
Start: 2018-01-19 — End: 2018-01-23

## 2018-01-19 MED ORDER — INSULIN GLARGINE 100 UNIT/ML ~~LOC~~ SOLN
8.0000 [IU] | Freq: Every day | SUBCUTANEOUS | Status: DC
  Administered 2018-01-20 (×2): 8 [IU] via SUBCUTANEOUS
  Filled 2018-01-19 (×2): qty 0.08

## 2018-01-19 MED ORDER — SODIUM CHLORIDE 0.9 % IV BOLUS
1000.0000 mL | Freq: Once | INTRAVENOUS | Status: AC
  Administered 2018-01-19: 1000 mL via INTRAVENOUS

## 2018-01-19 MED ORDER — ATORVASTATIN CALCIUM 40 MG PO TABS
40.0000 mg | ORAL_TABLET | Freq: Every day | ORAL | Status: DC
  Administered 2018-01-20: 40 mg via ORAL
  Filled 2018-01-19: qty 1

## 2018-01-19 MED ORDER — INSULIN ASPART 100 UNIT/ML ~~LOC~~ SOLN
0.0000 [IU] | Freq: Three times a day (TID) | SUBCUTANEOUS | Status: DC
  Administered 2018-01-20: 3 [IU] via SUBCUTANEOUS
  Administered 2018-01-20: 2 [IU] via SUBCUTANEOUS
  Administered 2018-01-20: 3 [IU] via SUBCUTANEOUS
  Administered 2018-01-21: 15 [IU] via SUBCUTANEOUS
  Administered 2018-01-21: 5 [IU] via SUBCUTANEOUS

## 2018-01-19 MED ORDER — LABETALOL HCL 5 MG/ML IV SOLN
10.0000 mg | Freq: Once | INTRAVENOUS | Status: AC
  Administered 2018-01-19: 10 mg via INTRAVENOUS
  Filled 2018-01-19: qty 4
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
## 2018-01-19 NOTE — ED Triage Notes (Signed)
Pt reports he went to bed at 2000, woke up at 0300 to urinate and felt normal but reports dizziness staring around 0900 today when he woke up and reports that he also started having blurry vision where he could see colors but couldn't make out words on signs. Pt reports that he does get eye injections once per month but is unsure why. EDP at bedside. NIH and VAN-
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
## 2018-01-19 NOTE — ED Provider Notes (Signed)
Patient placed in Quick Look pathway, seen and evaluated   Chief Complaint: Blurry vision and dizziness  HPI: Patient with history of diabetes, high cholesterol presents the emergency department with acute onset of blurry vision and dizziness described as imbalance with walking.  Patient states that he noticed symptoms when he woke up this morning at approximately 9 AM.  He states that he felt normal around 3 AM when he got up to urinate.  He was able to go out for groceries.  He noted that he was having trouble reading words at distances but can see colors.  He has a history of "eye injections" but does not know why he gets these.  He denies any weakness in the arms or the legs.  He denies any vision loss.  No slurred speech, facial droop, or trouble talking.  Symptoms are persistent.  ROS:  Positive ROS: (+) Blurry vision, dizziness Negative ROS: (-) Unilateral weakness, vision loss  Physical Exam:   Gen: No distress  Neuro: Awake and Alert  Skin: Warm    Focused Exam: Heart RRR, nml S1,S2, no m/r/g; Lungs CTAB; Abd soft, NT, no rebound or guarding; Ext 2+ pedal pulses bilaterally, no edema; neuro cranial nerves II through XII grossly intact without any visual field cuts detected, patient able to ambulate without assistance.  BP (!) 165/87 (BP Location: Right Arm)   Pulse 90   Temp (!) 100.4 F (38 C) (Oral)   Resp 16   Ht 6' (1.829 m)   Wt 79.4 kg (175 lb)   SpO2 100%   BMI 23.73 kg/m   Plan: No code stroke due to time of onset, patient is van negative.  Neuro work-up initiated.  Initiation of care has begun. The patient has been counseled on the process, plan, and necessity for staying for the completion/evaluation, and the remainder of the medical screening examination    Carlisle Cater, Hershal Coria 01/19/18 1714    Lajean Saver, MD 01/19/18 1758

## 2018-01-19 NOTE — ED Notes (Signed)
Patient transported to X-ray 

## 2018-01-19 NOTE — ED Notes (Signed)
Pt reports not being able to see past 5 feet which is new to him as of this morning.

## 2018-01-20 ENCOUNTER — Other Ambulatory Visit (HOSPITAL_COMMUNITY): Payer: Medicaid Other

## 2018-01-20 ENCOUNTER — Inpatient Hospital Stay (HOSPITAL_COMMUNITY): Payer: Medicaid Other

## 2018-01-20 DIAGNOSIS — Z794 Long term (current) use of insulin: Secondary | ICD-10-CM

## 2018-01-20 DIAGNOSIS — I5030 Unspecified diastolic (congestive) heart failure: Secondary | ICD-10-CM

## 2018-01-20 DIAGNOSIS — E1122 Type 2 diabetes mellitus with diabetic chronic kidney disease: Secondary | ICD-10-CM

## 2018-01-20 LAB — VITAMIN B12: Vitamin B-12: 548 pg/mL (ref 180–914)

## 2018-01-20 LAB — URINALYSIS, ROUTINE W REFLEX MICROSCOPIC
Bilirubin Urine: NEGATIVE
Glucose, UA: 150 mg/dL — AB
Hgb urine dipstick: NEGATIVE
Ketones, ur: NEGATIVE mg/dL
Leukocytes, UA: NEGATIVE
Nitrite: NEGATIVE
Protein, ur: >=300 mg/dL — AB
Specific Gravity, Urine: 1.009 (ref 1.005–1.030)
pH: 6 (ref 5.0–8.0)

## 2018-01-20 LAB — GLUCOSE, CAPILLARY
Glucose-Capillary: 158 mg/dL — ABNORMAL HIGH (ref 70–99)
Glucose-Capillary: 138 mg/dL — ABNORMAL HIGH (ref 70–99)
Glucose-Capillary: 151 mg/dL — ABNORMAL HIGH (ref 70–99)
Glucose-Capillary: 238 mg/dL — ABNORMAL HIGH (ref 70–99)

## 2018-01-20 LAB — RETICULOCYTES
RBC.: 3.68 MIL/uL — ABNORMAL LOW (ref 4.22–5.81)
Retic Count, Absolute: 40.5 10*3/uL (ref 19.0–186.0)
Retic Ct Pct: 1.1 % (ref 0.4–3.1)

## 2018-01-20 LAB — LIPID PANEL
Cholesterol: 229 mg/dL — ABNORMAL HIGH (ref 0–200)
HDL: 36 mg/dL — ABNORMAL LOW (ref >40)
LDL Cholesterol: 163 mg/dL — ABNORMAL HIGH (ref 0–99)
Total CHOL/HDL Ratio: 6.4 RATIO
Triglycerides: 150 mg/dL — ABNORMAL HIGH (ref <150)
VLDL: 30 mg/dL (ref 0–40)

## 2018-01-20 LAB — CBC
HCT: 27.6 % — ABNORMAL LOW (ref 39.0–52.0)
Hemoglobin: 8.5 g/dL — ABNORMAL LOW (ref 13.0–17.0)
MCH: 23.2 pg — ABNORMAL LOW (ref 26.0–34.0)
MCHC: 30.8 g/dL (ref 30.0–36.0)
MCV: 75.2 fL — ABNORMAL LOW (ref 78.0–100.0)
Platelets: 251 10*3/uL (ref 150–400)
RBC: 3.67 MIL/uL — ABNORMAL LOW (ref 4.22–5.81)
RDW: 14.2 % (ref 11.5–15.5)
WBC: 3 10*3/uL — ABNORMAL LOW (ref 4.0–10.5)

## 2018-01-20 LAB — FOLATE: Folate: 13.2 ng/mL (ref >5.9)

## 2018-01-20 LAB — ECHOCARDIOGRAM COMPLETE
Height: 72 in
Weight: 2800 oz

## 2018-01-20 LAB — HEMOGLOBIN A1C
Hgb A1c MFr Bld: 13.9 % — ABNORMAL HIGH (ref 4.8–5.6)
Mean Plasma Glucose: 352.23 mg/dL

## 2018-01-20 LAB — BASIC METABOLIC PANEL
Anion gap: 7 (ref 5–15)
BUN: 25 mg/dL — ABNORMAL HIGH (ref 6–20)
CO2: 25 mmol/L (ref 22–32)
Calcium: 8 mg/dL — ABNORMAL LOW (ref 8.9–10.3)
Chloride: 104 mmol/L (ref 98–111)
Creatinine, Ser: 3.74 mg/dL — ABNORMAL HIGH (ref 0.61–1.24)
GFR calc Af Amer: 20 mL/min — ABNORMAL LOW (ref >60)
GFR calc non Af Amer: 17 mL/min — ABNORMAL LOW (ref >60)
Glucose, Bld: 243 mg/dL — ABNORMAL HIGH (ref 70–99)
Potassium: 3.6 mmol/L (ref 3.5–5.1)
Sodium: 136 mmol/L (ref 135–145)

## 2018-01-20 LAB — TSH: TSH: 2.972 u[IU]/mL (ref 0.350–4.500)

## 2018-01-20 LAB — IRON AND TIBC
Iron: 13 ug/dL — ABNORMAL LOW (ref 45–182)
Saturation Ratios: 8 % — ABNORMAL LOW (ref 17.9–39.5)
TIBC: 172 ug/dL — ABNORMAL LOW (ref 250–450)
UIBC: 159 ug/dL

## 2018-01-20 LAB — FERRITIN: Ferritin: 275 ng/mL (ref 24–336)

## 2018-01-20 MED ORDER — AZITHROMYCIN 500 MG PO TABS
250.0000 mg | ORAL_TABLET | Freq: Every day | ORAL | Status: DC
  Administered 2018-01-21 – 2018-01-23 (×3): 250 mg via ORAL
  Filled 2018-01-20 (×3): qty 1

## 2018-01-20 MED ORDER — ENSURE ENLIVE PO LIQD
237.0000 mL | Freq: Two times a day (BID) | ORAL | Status: DC
  Administered 2018-01-20 – 2018-01-22 (×4): 237 mL via ORAL

## 2018-01-20 MED ORDER — AMLODIPINE BESYLATE 5 MG PO TABS
5.0000 mg | ORAL_TABLET | Freq: Once | ORAL | Status: AC
  Administered 2018-01-20: 5 mg via ORAL

## 2018-01-20 MED ORDER — AZITHROMYCIN 500 MG PO TABS
500.0000 mg | ORAL_TABLET | Freq: Every day | ORAL | Status: AC
  Administered 2018-01-20: 500 mg via ORAL
  Filled 2018-01-20: qty 1

## 2018-01-20 MED ORDER — CEFDINIR 300 MG PO CAPS
300.0000 mg | ORAL_CAPSULE | Freq: Every day | ORAL | Status: DC
  Administered 2018-01-22 – 2018-01-23 (×2): 300 mg via ORAL
  Filled 2018-01-20 (×2): qty 1

## 2018-01-20 MED ORDER — SODIUM CHLORIDE 0.9 % IV SOLN
1.0000 g | INTRAVENOUS | Status: AC
  Administered 2018-01-20 – 2018-01-21 (×2): 1 g via INTRAVENOUS
  Filled 2018-01-20 (×2): qty 10

## 2018-01-20 MED ORDER — HYDRALAZINE HCL 20 MG/ML IJ SOLN: 2.0000 mg | INTRAMUSCULAR | Status: DC | PRN

## 2018-01-20 MED ORDER — PNEUMOCOCCAL 13-VAL CONJ VACC IM SUSP: 0.5000 mL | INTRAMUSCULAR | Status: DC

## 2018-01-20 MED ORDER — HYDRALAZINE HCL 20 MG/ML IJ SOLN
10.0000 mg | INTRAMUSCULAR | Status: DC | PRN
  Administered 2018-01-20 – 2018-01-21 (×2): 10 mg via INTRAVENOUS
  Filled 2018-01-20 (×2): qty 1

## 2018-01-20 MED ORDER — ATORVASTATIN CALCIUM 80 MG PO TABS
80.0000 mg | ORAL_TABLET | Freq: Every day | ORAL | Status: DC
  Administered 2018-01-21 – 2018-01-23 (×3): 80 mg via ORAL
  Filled 2018-01-20 (×3): qty 1

## 2018-01-20 MED ORDER — LABETALOL HCL 5 MG/ML IV SOLN
5.0000 mg | INTRAVENOUS | Status: DC | PRN
  Administered 2018-01-20: 5 mg via INTRAVENOUS
  Filled 2018-01-20 (×2): qty 4

## 2018-01-20 MED ORDER — AMLODIPINE BESYLATE 10 MG PO TABS
10.0000 mg | ORAL_TABLET | Freq: Every day | ORAL | Status: DC
  Administered 2018-01-21 – 2018-01-23 (×3): 10 mg via ORAL
  Filled 2018-01-20 (×3): qty 1

## 2018-01-20 NOTE — Progress Notes (Signed)
Nutrition Brief Note  Patient identified on the Malnutrition Screening Tool (MST) Report  RD was unable to see patient today as he was in shower on RD arrival.   Family at bedside confirm patient has lost a lot of weight. >20 lbs per their estimate. They note pt eats extremely well, but just eats the wrong things. His loss is likely related to uncontrolled DM. RD offered DM education, but one member says "just so you know, he is going to eat whatever he wants".   Perhaps a larger issue, they note pt has a very difficult time affording his medication. This is a large reason pt has medication noncompliance. Recommend CSW referral.   RD will attempt to see next week if still admitted.   Burtis Junes RD, LDN, CNSC Clinical Nutrition Available Tues-Sat via Pager: [phone] 01/20/2018 4:41 PM

## 2018-01-20 NOTE — Progress Notes (Signed)
Patient admitted to [address] with the diagnosis of hypertensive urgency. Alert and oriented x 4. Denied any acute pain at this time. Full assessment to epic completed Patient oriented to his room, staff/ascom. Patient is very appreciative of care. Will continue to monitor.

## 2018-01-20 NOTE — Progress Notes (Addendum)
Family Medicine Teaching Service Daily Progress Note Intern Pager: [phone]  Patient name: Jeremiah [name] Medical record number: [id number] Date of birth: 1966-04-30 Age: 52 y.o. Gender: male  Primary Care Provider: Bufford Lope, DO Consultants: None Code Status: Full  Pt Overview and Major Events to Date:  7/27 - Admit  Assessment and Plan: Jeremiah [name] is a 52 y.o. male presenting with blurred vision. PMH is significant for hypertension, poorly controlled diabetes, Stroke, CKD, Hyperlipidemia, and poor medication compliance.  Hypertensive Emergency with visual disturbance: Peak BP on admission 211/103, corrected to 180/90 with Labetolol IV 30mg  in ED. Patient's blurry vision improved after improving BP. Patient also has history of poorly controlled T2DM which might be contributing to blurred vision.  - Admit to telemetry, attending Dr. Gwendlyn Deutscher - Cardiac monitoring - Modified stroke scale - Neuro checks q4 hours - CBC / CMP - s/p Labetalol 30mg  in the ED - Risk strat labs - HbA1c, TSH, Lipid Panel - Restart Norvasc at 10mg  - Labetalol 5mg  q2hrs PRN BP >170/95 - Aspirin 81mg  - Tylenol PRN Headaches  RLL Pneumonia: CAP. patient presenting with 100.4*F temp on admission. He reports he has been coughing for the past few days since visiting a friend with pneumonia in the hospital. CXR in the ED showed RLL pneumonia. WBC count 3.8. Lactic Acid 0.58 nml. Crackles noted over RLL on exam. Patient satting 100% on RA.  - CTX 1g IV qDaily with Azithromycin 500mg  PO x1 day, then 250mg  PO qDaily x 4 days, can consider Cefdinir 300mg  PO qDaily x 3 days - Tylenol PRN Fever  Uncontrolled T2DM: Most recent A1c on 09/28/17 was >15%. CBG on admission 247. Pending new A1c. Gabapentin for neuropathy. Prescribed Tresiba 16 U and Liraglutide, but taking friend's Lantus.  - POCT CBG's AC/qHS - mSSI and Lantus 8 Units qHS - Holding gabapentin   New Murmur: Patient with new systolic 3/6 murmur  on exam. Could be due to hypertensive emergency, but will evaluate further.  - ECHO pending - Monitor on telemetry - Monitor vitals  CKD, Stage 4: Cr on admission 4.10. Baseline unclear. GFR at 19 down from 23 and 30 in months past. CrCl 23.1. - Monitor  - I&Os - Avoid nephrotoxic meds - Dose adjust per renal function  Hyperlipidemia: prescribed atorva 40mg  daily, patient not taking. Most recent lipid panel (July 2019) shows Chol 229 H, HDL 36 L, LDL 163 H. - Restart atorvastatin 80   FEN/GI: heart-modified Prophylaxis: Lovenox  Disposition: Admit to telemetry  Subjective:  Patient is seen this morning resting and relaxing in bed. He reports feeling much better and denies new problems. He denies headaches, worsening vision, focal neural deficits, chest pain, shortness of breath, cough, nausea, vomiting, diarrhea, myalgias, and chills.  Objective: Temp:  [99.3 F (37.4 C)-100.4 F (38 C)] 99.3 F (37.4 C) (07/27 0506) Pulse Rate:  [71-98] 71 (07/27 0536) Resp:  [0-25] 16 (07/27 0506) BP: (165-211)/(84-129) 189/90 (07/27 0536) SpO2:  [99 %-100 %] 100 % (07/27 0506) Weight:  [175 lb (79.4 kg)] 175 lb (79.4 kg) (07/26 1709) Physical Exam: Constitutional: He is oriented to person, place, and time. He appears well-developed and well-nourished.  HENT:  Head: Normocephalic and atraumatic.  Eyes: EOM are normal. He reports improving vision and can read signs across the room, which he was unable to do yesterday. Neck: Normal range of motion. Neck supple. No thyromegaly present.  Cardiovascular: Normal rate, regular rhythm and intact distal pulses.  3/6 systolic murmur, new  Pulmonary/Chest: No stridor. No respiratory distress. He has no wheezes. He exhibits no tenderness.  Crackles in RLL  Abdominal: Soft. Bowel sounds are normal. He exhibits no distension. There is no tenderness.  Musculoskeletal: Normal range of motion. He exhibits no edema or deformity.  Neurological: He is  alert and oriented to person, place, and time. A cranial nerve deficit (difficulty with frown and smile 2/2 previous jaw injury) is present. No sensory deficit. He exhibits normal muscle tone. Coordination normal.  Skin: Skin is warm and dry. Capillary refill takes less than 2 seconds. No rash noted. He is not diaphoretic. No erythema.  Psychiatric: He has a normal mood and affect. His behavior is normal.   Laboratory: Recent Labs  Lab 01/19/18 1718 01/19/18 1738 01/20/18 0333  WBC 3.8*  --  3.0*  HGB 8.7* 8.2* 8.5*  HCT 27.9* 24.0* 27.6*  PLT 268  --  251   Recent Labs  Lab 01/19/18 1718 01/19/18 1738 01/20/18 0333  NA 134* 134* 136  K 4.2 4.1 3.6  CL 103 102 104  CO2 23  --  25  BUN 28* 27* 25*  CREATININE 3.83* 4.10* 3.74*  CALCIUM 8.1*  --  8.0*  PROT 5.3*  --   --   BILITOT 0.4  --   --   ALKPHOS 80  --   --   ALT 11  --   --   AST 18  --   --   GLUCOSE 251* 247* 243*   Lipid Panel (7/27):    Component Value Date/Time   CHOL 229 (H) 01/20/2018 0333   TRIG 150 (H) 01/20/2018 0333   HDL 36 (L) 01/20/2018 0333   CHOLHDL 6.4 01/20/2018 0333   VLDL 30 01/20/2018 0333   LDLCALC 163 (H) 01/20/2018 0333   LDLDIRECT 166 (H) 07/23/2013 1520   UA (7/27): needs collecting HgA1c (7/27): pending TSH (7/27): 2.972 nml Lactic Acid (7/26): 0.58 nml Troponin (7/26): 0.02 nml HIV (7/26): non-reactive APTT (7/26): 38 elevated INR (7/26): 1.02 nml  Imaging/Diagnostic Tests: CXR (7/26): right lower lobe pneumonia.   Ct Head Wo Contrast (7/26): Normal head CT.   Mr Brain Wo Contrast (01/19/18): No acute intracranial process on this motion degraded examination. Borderline parenchymal brain volume loss for age.   Daisy Floro, DO 01/20/2018, 6:02 AM PGY-1, [place] Intern pager: [phone], text pages welcome

## 2018-01-20 NOTE — Progress Notes (Signed)
*  PRELIMINARY RESULTS* Echocardiogram 2D Echocardiogram has been performed.  Jeremiah [name], [place] 01/20/2018, 3:56 PM

## 2018-01-21 DIAGNOSIS — E1159 Type 2 diabetes mellitus with other circulatory complications: Secondary | ICD-10-CM

## 2018-01-21 DIAGNOSIS — N183 Chronic kidney disease, stage 3 unspecified: Secondary | ICD-10-CM

## 2018-01-21 LAB — BASIC METABOLIC PANEL
Anion gap: 9 (ref 5–15)
BUN: 24 mg/dL — ABNORMAL HIGH (ref 6–20)
CO2: 20 mmol/L — ABNORMAL LOW (ref 22–32)
Calcium: 8 mg/dL — ABNORMAL LOW (ref 8.9–10.3)
Chloride: 106 mmol/L (ref 98–111)
Creatinine, Ser: 3.53 mg/dL — ABNORMAL HIGH (ref 0.61–1.24)
GFR calc Af Amer: 21 mL/min — ABNORMAL LOW (ref >60)
GFR calc non Af Amer: 18 mL/min — ABNORMAL LOW (ref >60)
Glucose, Bld: 253 mg/dL — ABNORMAL HIGH (ref 70–99)
Potassium: 3.7 mmol/L (ref 3.5–5.1)
Sodium: 135 mmol/L (ref 135–145)

## 2018-01-21 LAB — CBC
HCT: 31.3 % — ABNORMAL LOW (ref 39.0–52.0)
Hemoglobin: 9.8 g/dL — ABNORMAL LOW (ref 13.0–17.0)
MCH: 23.2 pg — ABNORMAL LOW (ref 26.0–34.0)
MCHC: 31.3 g/dL (ref 30.0–36.0)
MCV: 74 fL — ABNORMAL LOW (ref 78.0–100.0)
Platelets: 306 10*3/uL (ref 150–400)
RBC: 4.23 MIL/uL (ref 4.22–5.81)
RDW: 14.2 % (ref 11.5–15.5)
WBC: 5 10*3/uL (ref 4.0–10.5)

## 2018-01-21 LAB — GLUCOSE, CAPILLARY
Glucose-Capillary: 230 mg/dL — ABNORMAL HIGH (ref 70–99)
Glucose-Capillary: 362 mg/dL — ABNORMAL HIGH (ref 70–99)
Glucose-Capillary: 158 mg/dL — ABNORMAL HIGH (ref 70–99)
Glucose-Capillary: 44 mg/dL — CL (ref 70–99)
Glucose-Capillary: 57 mg/dL — ABNORMAL LOW (ref 70–99)
Glucose-Capillary: 329 mg/dL — ABNORMAL HIGH (ref 70–99)
Glucose-Capillary: 382 mg/dL — ABNORMAL HIGH (ref 70–99)

## 2018-01-21 MED ORDER — INSULIN ASPART 100 UNIT/ML ~~LOC~~ SOLN
0.0000 [IU] | Freq: Three times a day (TID) | SUBCUTANEOUS | Status: DC
  Administered 2018-01-22 (×2): 5 [IU] via SUBCUTANEOUS
  Administered 2018-01-22: 2 [IU] via SUBCUTANEOUS
  Administered 2018-01-23: 5 [IU] via SUBCUTANEOUS
  Administered 2018-01-23: 3 [IU] via SUBCUTANEOUS

## 2018-01-21 MED ORDER — SENNA 8.6 MG PO TABS
1.0000 | ORAL_TABLET | Freq: Every day | ORAL | Status: DC
Start: 2018-01-21 — End: 2018-01-23
  Administered 2018-01-22 – 2018-01-23 (×2): 8.6 mg via ORAL
  Filled 2018-01-21 (×3): qty 1

## 2018-01-21 MED ORDER — DEXTROSE 50 % IV SOLN
INTRAVENOUS | Status: AC
  Administered 2018-01-21: 50 mL
  Filled 2018-01-21: qty 50

## 2018-01-21 MED ORDER — HYDRALAZINE HCL 20 MG/ML IJ SOLN
10.0000 mg | INTRAMUSCULAR | Status: DC | PRN
Start: 2018-01-21 — End: 2018-01-23

## 2018-01-21 MED ORDER — CARVEDILOL 6.25 MG PO TABS
6.2500 mg | ORAL_TABLET | Freq: Two times a day (BID) | ORAL | Status: DC
  Administered 2018-01-21 – 2018-01-23 (×5): 6.25 mg via ORAL
  Filled 2018-01-21 (×5): qty 1

## 2018-01-21 MED ORDER — INSULIN GLARGINE 100 UNIT/ML ~~LOC~~ SOLN
12.0000 [IU] | Freq: Every day | SUBCUTANEOUS | Status: DC
  Administered 2018-01-21 – 2018-01-22 (×2): 12 [IU] via SUBCUTANEOUS
  Filled 2018-01-21 (×3): qty 0.12

## 2018-01-21 MED ORDER — GLUCOSE 40 % PO GEL
ORAL | Status: AC
  Administered 2018-01-21: 37.5 g
  Filled 2018-01-21: qty 1

## 2018-01-21 MED ORDER — ONDANSETRON HCL 4 MG/2ML IJ SOLN
4.0000 mg | Freq: Three times a day (TID) | INTRAMUSCULAR | Status: DC | PRN
  Administered 2018-01-21: 4 mg via INTRAVENOUS

## 2018-01-21 MED ORDER — FERROUS SULFATE 325 (65 FE) MG PO TABS
325.0000 mg | ORAL_TABLET | Freq: Two times a day (BID) | ORAL | Status: DC
  Administered 2018-01-21 – 2018-01-23 (×5): 325 mg via ORAL
  Filled 2018-01-21 (×5): qty 1

## 2018-01-21 NOTE — Progress Notes (Signed)
PT Cancellation Note  Patient Details Name: Jeremiah [name] MRN: [id number] DOB: [birth year]   Cancelled Treatment:    Reason Eval/Treat Not Completed: PT screened, no needs identified, will sign off. Per RN, pt is independent and has no mobility issues. No skilled PT required. PT will sign off. Please re-order if any new needs arise.   Scheryl Marten PT, DPT   Jacqulyn Liner Sloan Leiter 01/21/2018, 10:22 AM

## 2018-01-21 NOTE — Progress Notes (Signed)
Hypoglycemic Event  1630 CBG: 44  Treatment: 15 GM gel  Symptoms: Sweaty, Shaky, Hungry and Vision changes  Follow-up CBG: Time: 1645 CBG Result:57   Treatment: D50 IV 50 mL  Symptoms: Sweaty and Shaky  Follow-up CBG: Time:1708 CBG Result:158  At second CBG recheck patient was awake, and oriented, still sweating but not as profusely as before.   Possible Reasons for Event: Medication regimen: Insulin SS  Comments/MD notified: Family Medicine team made aware, SSI discontinued at this time. Will continue to monitor closely.     Hitt, Tobie Lords

## 2018-01-21 NOTE — Progress Notes (Signed)
Family Medicine Teaching Service Daily Progress Note Intern Pager: [phone]  Patient name: Jeremiah [name] Medical record number: [id number] Date of birth: 1965/08/19 Age: 52 y.o. Gender: male  Primary Care Provider: Bufford Lope, DO Consultants: None Code Status: Full  Pt Overview and Major Events to Date:  7/27 - Admit  Assessment and Plan: Jeremiah [name] is a 52 y.o. male presenting with blurred vision. PMH is significant for hypertension, poorly controlled diabetes, Stroke, CKD, Hyperlipidemia, and poor medication compliance.  Hypertensive emergency with visual disturbance, improving BP on admission 211/103, corrected to 180/90 with Labetolol IV 30mg  in ED. Patient's blurry vision improved after improving BP. Patient also has history of poorly controlled T2DM which might be contributing to blurred vision. This am BP 167/69.  --Continue amlodipine 10 mg daily, would add second agent ACE-I/ARBs give n T2DM --Hydralazine 10 mg q4 prn  --Modified stroke scale --CBC / CMP --Neuro checks q4 hours --Aspirin 81mg  --Tylenol PRN  RLL Pneumonia concerning  CAP Patient presenting with 100.4*F temp on admission. He reports he has been coughing for the past few days since visiting a friend with pneumonia in the hospital. CXR in the ED showed RLL pneumonia. WBC count 3.8. Lactic Acid 0.58 nml. Crackles noted over RLL on exam. Patient satting 100% on RA.  --Continue Azithromycin (DAY2) and cefdinir (DAY2) --Monitor respiratory status  T2DM, uncontrolled A1c 13.9. A1c on 09/28/17 was >15%. CBG on admission 247. Gabapentin for neuropathy. Prescribed Tresiba 16 U and Liraglutide, but taking friend's Lantus.  - POCT CBG's AC/qHS - mSSI and Lantus 8 Units qHS - Holding gabapentin, reduced dose given kidney function prior to discharge    New systolic murmur Patient with new systolic 3/6 murmur on exam. Could be due to hypertensive emergency. Echo with an EF 65-70%, G1DD, severe LV wall  thickening. Consideration for HCM vs infiltrative Cardiomyopathy. Patient with uncontrolled HTN likely contributing factor. --Outpatient cardiology workup - Monitor on telemetry - Monitor vitals  CKD, Stage 4 Cr on admission 4.10. Most recent creatinine 3.38. Appears baseline has been worsening in the past two years. 2.3-2.5 back in 2018. UA showed proteinuria. Likely secondary to uncontrolled DM, uncontrolled HTN. - Nephrology referral [name] while inpatient --Start ACEi/ARBs - I&Os - Avoid nephrotoxic meds  Anemia Iron panel shows Iron 13 , sat 8% consistent with iron deficiency though TIBC is low. Anemia appears to be multifactorial in nature with CKD-4 contributing.  --Nephrology consult --Consider starting patient on iron supplementation --Bowel regimen --Follow up on am CBC and BMP  Hyperlipidemia Prescribed atorvastatin 40mg  daily, patient not taking. Most recent lipid panel shows Chol 229 H, HDL 36 , LDL 163  - Restart atorvastatin 80   FEN/GI: heart-modified Prophylaxis: Lovenox  Disposition: Home pending clinical improvement  Subjective:  No acute event overnight. Patient reports vision has returned to normal and HA are much improved. Patient ambulating, good po intake.  Objective: Temp:  [98.4 F (36.9 C)-100.6 F (38.1 C)] 99.2 F (37.3 C) (07/28 0433) Pulse Rate:  [73-92] 92 (07/28 0519) Resp:  [14-18] 18 (07/28 0433) BP: (163-197)/(69-92) 167/69 (07/28 0519) SpO2:  [98 %-100 %] 100 % (07/28 0433)   Physical Exam: Constitutional: He is oriented to person, place, and time. He appears well-developed and well-nourished.  HENT:  Head: Normocephalic and atraumatic.  Eyes: EOM are normal. He reports improving vision and can read signs across the room, which he was unable to do yesterday. Neck: Normal range of motion. Neck supple. No thyromegaly present.  Cardiovascular:  Normal rate, regular rhythm and intact distal pulses.  Pulmonary/Chest: No stridor. No  respiratory distress. He has no wheezes. He exhibits no tenderness.  Crackles in RLL  Abdominal: Soft. Bowel sounds are normal. He exhibits no distension. There is no tenderness.  Musculoskeletal: Normal range of motion. He exhibits no edema or deformity.  Neurological: He is alert and oriented to person, place, and time.  No sensory deficit. He exhibits normal muscle tone. Coordination normal.  Skin: Skin is warm and dry. Capillary refill takes less than 2 seconds. No rash noted. He is not diaphoretic. No erythema.  Psychiatric: He has a normal mood and affect. His behavior is normal.   Laboratory: Recent Labs  Lab 01/19/18 1718 01/19/18 1738 01/20/18 0333  WBC 3.8*  --  3.0*  HGB 8.7* 8.2* 8.5*  HCT 27.9* 24.0* 27.6*  PLT 268  --  251   Recent Labs  Lab 01/19/18 1718 01/19/18 1738 01/20/18 0333  NA 134* 134* 136  K 4.2 4.1 3.6  CL 103 102 104  CO2 23  --  25  BUN 28* 27* 25*  CREATININE 3.83* 4.10* 3.74*  CALCIUM 8.1*  --  8.0*  PROT 5.3*  --   --   BILITOT 0.4  --   --   ALKPHOS 80  --   --   ALT 11  --   --   AST 18  --   --   GLUCOSE 251* 247* 243*   Lipid Panel (7/27):    Component Value Date/Time   CHOL 229 (H) 01/20/2018 0333   TRIG 150 (H) 01/20/2018 0333   HDL 36 (L) 01/20/2018 0333   CHOLHDL 6.4 01/20/2018 0333   VLDL 30 01/20/2018 0333   LDLCALC 163 (H) 01/20/2018 0333   LDLDIRECT 166 (H) 07/23/2013 1520    HgA1c (7/27): 13.9 TSH (7/27): 2.972 nml Lactic Acid (7/26): 0.58 nml Troponin (7/26): 0.02 nml HIV (7/26): non-reactive APTT (7/26): 38 elevated INR (7/26): 1.02 nml  Imaging/Diagnostic Tests: CXR (7/26): right lower lobe pneumonia.   Ct Head Wo Contrast (7/26): Normal head CT.   Mr Brain Wo Contrast (01/19/18): No acute intracranial process on this motion degraded examination. Borderline parenchymal brain volume loss for age.   Marjie Skiff, MD 01/21/2018, 6:54 AM PGY-3, [place] Intern pager: [phone],  text pages welcome

## 2018-01-22 ENCOUNTER — Inpatient Hospital Stay (HOSPITAL_COMMUNITY): Payer: Medicaid Other

## 2018-01-22 DIAGNOSIS — E118 Type 2 diabetes mellitus with unspecified complications: Secondary | ICD-10-CM

## 2018-01-22 LAB — BASIC METABOLIC PANEL
Anion gap: 9 (ref 5–15)
BUN: 29 mg/dL — ABNORMAL HIGH (ref 6–20)
CO2: 23 mmol/L (ref 22–32)
Calcium: 7.9 mg/dL — ABNORMAL LOW (ref 8.9–10.3)
Chloride: 102 mmol/L (ref 98–111)
Creatinine, Ser: 3.92 mg/dL — ABNORMAL HIGH (ref 0.61–1.24)
GFR calc Af Amer: 19 mL/min — ABNORMAL LOW (ref >60)
GFR calc non Af Amer: 16 mL/min — ABNORMAL LOW (ref >60)
Glucose, Bld: 321 mg/dL — ABNORMAL HIGH (ref 70–99)
Potassium: 3.8 mmol/L (ref 3.5–5.1)
Sodium: 134 mmol/L — ABNORMAL LOW (ref 135–145)

## 2018-01-22 LAB — CBC
HCT: 28.6 % — ABNORMAL LOW (ref 39.0–52.0)
Hemoglobin: 8.9 g/dL — ABNORMAL LOW (ref 13.0–17.0)
MCH: 23.1 pg — ABNORMAL LOW (ref 26.0–34.0)
MCHC: 31.1 g/dL (ref 30.0–36.0)
MCV: 74.1 fL — ABNORMAL LOW (ref 78.0–100.0)
Platelets: 284 10*3/uL (ref 150–400)
RBC: 3.86 MIL/uL — ABNORMAL LOW (ref 4.22–5.81)
RDW: 14.1 % (ref 11.5–15.5)
WBC: 4.1 10*3/uL (ref 4.0–10.5)

## 2018-01-22 LAB — GLUCOSE, CAPILLARY
Glucose-Capillary: 276 mg/dL — ABNORMAL HIGH (ref 70–99)
Glucose-Capillary: 280 mg/dL — ABNORMAL HIGH (ref 70–99)
Glucose-Capillary: 193 mg/dL — ABNORMAL HIGH (ref 70–99)
Glucose-Capillary: 323 mg/dL — ABNORMAL HIGH (ref 70–99)

## 2018-01-22 LAB — PHOSPHORUS: Phosphorus: 3.4 mg/dL (ref 2.5–4.6)

## 2018-01-22 MED ORDER — SENNA 8.6 MG PO TABS: 1.0000 | ORAL_TABLET | Freq: Every day | ORAL | Status: DC | PRN

## 2018-01-22 MED ORDER — LIVING WELL WITH DIABETES BOOK
Freq: Once | Status: AC
  Administered 2018-01-22: 1
  Filled 2018-01-22: qty 1

## 2018-01-22 MED ORDER — SODIUM CHLORIDE 0.9 % IV SOLN
INTRAVENOUS | Status: DC
  Administered 2018-01-22: 1000 mL via INTRAVENOUS
  Administered 2018-01-23: 04:00:00 via INTRAVENOUS

## 2018-01-22 NOTE — Progress Notes (Signed)
Inpatient Diabetes Program Recommendations  AACE/ADA: New Consensus Statement on Inpatient Glycemic Control (2015)  Target Ranges:  Prepandial:   less than 140 mg/dL      Peak postprandial:   less than 180 mg/dL (1-2 hours)      Critically ill patients:  140 - 180 mg/dL   Results for BYREN, PANKOW (MRN [id number]) as of 01/22/2018 10:11  Ref. Range 01/21/2018 08:01 01/21/2018 12:02 01/21/2018 16:31 01/21/2018 16:52 01/21/2018 17:09 01/21/2018 21:05 01/21/2018 22:07 01/22/2018 07:33  Glucose-Capillary Latest Ref Range: 70 - 99 mg/dL 230 (H) 362 (H) 44 (LL) 57 (L) 158 (H) 329 (H) 382 (H) 276 (H)   Review of Glycemic Control  Diabetes history: DM 2 Outpatient Diabetes medications: Lantus 16 units, friends insulin Current orders for Inpatient glycemic control: Lantus 12 units, Novolog 0-9 units tid  Inpatient Diabetes Program Recommendations:    Noted fasting glucose 270's this am on Lantus 12 units. Consider increasing Lantus to 16-18 units. Note patient had hypoglycemia yesterday due to amount of Novolog given. Patient now on Novolog 0-9 units tid. Due to patient not having insurance will need 70/30 more than likely at time of d/c, unless they can follow up at one of the clinics. 70/30 will also help to cover what patient eats at home.  Per RD's note family reported patient will continue to eat what he wants.  Thanks,  Tama Headings RN, MSN, BC-ADM Inpatient Diabetes Coordinator Team Pager [phone] (8a-5p)

## 2018-01-22 NOTE — Progress Notes (Signed)
Patient is doing well overall. BP improved a lot. Denies dizziness. His right eye is back to baseline, however, he still have a non-progressive blurriness of his left eye. I called and discussed with the oncall ophthalmologist/ Dr. Alanda Slim, he does not recommend urgent evaluation, he stated that patient can be evaluated as outpatient. Nephro to see for CKD. Renal [place] ordered.  I will cosign resident's note when done.

## 2018-01-22 NOTE — Progress Notes (Signed)
Family Medicine Teaching Service Daily Progress Note Intern Pager: [phone]  Patient name: Jeremiah [name] Medical record number: [id number] Date of birth: 10-24-1965 Age: 52 y.o. Gender: male  Primary Care Provider: Bufford Lope, DO Consultants: None Code Status: Full  Pt Overview and Major Events to Date:  7/27 - Admit  Assessment and Plan: Jeremiah [name] a 52 y.o.malepresenting with blurred vision. PMH is significant forhypertension,poorly controlled diabetes, Stroke, CKD, Hyperlipidemia, and poor medication compliance.  Hypertensive emergencywith visual disturbance, improving BP on admission 211/103, corrected to 180/90with LabetololIV 30mg  in ED.Patient's blurry vision improved after improving BP.Patient also has history of poorly controlled T2DM which might be contributing to blurred vision.This am BP 167/69.  - Continue amlodipine 10 mg daily, would add second agent ACE-I/ARBs give n T2DM - Hydralazine 10 mg q4 prn  - Modified stroke scale - CBC / CMP - Neuro checks q4 hours - Aspirin 81mg  - Tylenol PRN  RLLPneumonia concerning CAP Patient presenting with 100.4*F temp on admission. He reports he has been coughing for the past few days since visiting a [place] with pneumonia in the [place] WBC count 3.8. Lactic Acid 0.58 nml.Crackles noted over RLL on exam. Patient satting 100% on RA. - Continue Azithromycin and cefdinir - Monitor respiratory status  T2DM, uncontrolled: A1c 13.9. A1c on 09/28/17 was >15%.CBG on admission247. Gabapentin for neuropathy. Prescribed Tresiba16 UandLiraglutide, but taking friend's Lantus.  - POCTCBG's AC/qHS - sSSIand Lantus 12 Units qHS -Holding gabapentin, reduced dose given kidney function prior to discharge    New systolic murmur Patient with new systolic 3/6 murmur on exam. Could be due to hypertensive emergency. Echo with an EF 65-70%, G1DD, severe LV wall thickening.  Consideration for HCM vs infiltrative Cardiomyopathy. Patient with uncontrolled HTN likely contributing factor. - Outpatient cardiology workup - Monitor on telemetry - Monitor vitals  CKD,Stage 4: Cr on admission 4.10. Most recent creatinine 3.38. Appears baseline has been worsening in the past two years. 2.3-2.5 back in 2018. UA showed proteinuria. Likely secondary to uncontrolled DM, uncontrolled HTN. - Nephrology referral Jeremiah [name] while inpatient - I&Os -Avoidnephrotoxic meds  Anemia: Iron panel shows Iron 13, sat 8% consistent with iron deficiency though TIBC is low. Anemia appears to be multifactorial in nature with CKD-4 contributing.  - Nephrology consult - Ferrous sulfate 325 BID - recheck outpatient in 1 month - Bowel regimen - Follow up on am CBC and BMP  Hyperlipidemia: prescribed atorvastatin 40mg  daily at home, patient not taking.Most recent lipid panel shows Chol 229 H, HDL 36, LDL 163  - Atorvastatin 80  FEN/GI:heart-modified Prophylaxis:Lovenox  Disposition:Home pending clinical improvement  Subjective:  Patient is seen this morning resting in bed. He reports his vision has improved back to his baseline. He is able to read signs across the room. He denies headaches, chest pain, cough, shortness of breath, abdominal pain, nausea, vomiting, diarrhea, constipation, and suprapubic tenderness.  Objective: Temp:  [98.3 F (36.8 C)-99.5 F (37.5 C)] 98.3 F (36.8 C) (07/29 0738) Pulse Rate:  [70-85] 73 (07/29 0738) Resp:  [16-18] 17 (07/29 0738) BP: (155-175)/(83-95) 158/87 (07/29 0738) SpO2:  [98 %-100 %] 100 % (07/29 0738) Physical Exam  Constitutional: He appears well-developed and well-nourished.  HENT:  Head: Normocephalic.  Eyes: EOM are normal.  Neck: Normal range of motion.  Cardiovascular: Normal rate and regular rhythm.  2/6 systolic murmur  Pulmonary/Chest: Effort normal and breath sounds normal.  Abdominal: Soft. Bowel sounds are normal.   Musculoskeletal: Normal range of  motion.  Neurological: He is alert.  Skin: Skin is warm. Capillary refill takes less than 2 seconds.  Psychiatric: He has a normal mood and affect.   Laboratory: Recent Labs  Lab 01/20/18 0333 01/21/18 0815 01/22/18 0807  WBC 3.0* 5.0 4.1  HGB 8.5* 9.8* 8.9*  HCT 27.6* 31.3* 28.6*  PLT 251 306 284   Recent Labs  Lab 01/19/18 1718  01/20/18 0333 01/21/18 0815 01/22/18 0807  NA 134*   < > 136 135 134*  K 4.2   < > 3.6 3.7 3.8  CL 103   < > 104 106 102  CO2 23  --  25 20* 23  BUN 28*   < > 25* 24* 29*  CREATININE 3.83*   < > 3.74* 3.53* 3.92*  CALCIUM 8.1*  --  8.0* 8.0* 7.9*  PROT 5.3*  --   --   --   --   BILITOT 0.4  --   --   --   --   ALKPHOS 80  --   --   --   --   ALT 11  --   --   --   --   AST 18  --   --   --   --   GLUCOSE 251*   < > 243* 253* 321*   < > = values in this interval not displayed.   Imaging/Diagnostic Tests: Dg Chest 2 View  Result Date: 01/19/2018 CLINICAL DATA:  Fever EXAM: CHEST - 2 VIEW COMPARISON:  CT 08/12/2013, chest x-ray 08/12/2013 FINDINGS: Right lower lobe airspace disease suspicious for a pneumonia. Borderline cardiomegaly. No pleural effusion. No pneumothorax. IMPRESSION: Findings suspicious for right lower lobe pneumonia. Radiographic follow-up to resolution is recommended. Electronically Signed   By: Donavan Foil M.D.   On: 01/19/2018 22:23   Ct Head Wo Contrast  Result Date: 01/19/2018 CLINICAL DATA:  Dizziness, blurred vision. EXAM: CT HEAD WITHOUT CONTRAST TECHNIQUE: Contiguous axial images were obtained from the base of the skull through the vertex without intravenous contrast. COMPARISON:  MRI and CT scans of August 21, 2014. FINDINGS: Brain: No evidence of acute infarction, hemorrhage, hydrocephalus, extra-axial collection or mass lesion/mass effect. Vascular: No hyperdense vessel or unexpected calcification. Skull: Normal. Negative for fracture or focal lesion. Sinuses/Orbits: No acute  finding. Other: None. IMPRESSION: Normal head CT. Electronically Signed   By: Marijo Conception, M.D.   On: 01/19/2018 17:52   Mr Brain Wo Contrast  Result Date: 01/19/2018 CLINICAL DATA:  Blurry vision and dizziness beginning this morning. History of by injection, hypertension, diabetes. EXAM: MRI HEAD WITHOUT CONTRAST TECHNIQUE: Multiplanar, multiecho pulse sequences of the brain and surrounding structures were obtained without intravenous contrast. COMPARISON:  CT HEAD January 19, 2018 and MRI of the head August 21, 2014. FINDINGS: Mild motion degraded examination. INTRACRANIAL CONTENTS: No reduced diffusion to suggest acute ischemia or hyperacute demyelination. No susceptibility artifact to suggest hemorrhage. Borderline parenchymal brain volume loss. No hydrocephalus. No suspicious parenchymal signal, masses, mass effect. No abnormal extra-axial fluid collections. No extra-axial masses. VASCULAR: Normal major intracranial vascular flow voids present at skull base. SKULL AND UPPER CERVICAL SPINE: No abnormal sellar expansion. No suspicious calvarial bone marrow signal. Craniocervical junction maintained. SINUSES/ORBITS: Trace paranasal sinus mucosal thickening. Minimal paranasal sinus mucosal thickening without air-fluid levels.The included ocular globes and orbital contents are non-suspicious. OTHER: None. IMPRESSION: 1. No acute intracranial process on this motion degraded examination. 2. Borderline parenchymal brain volume loss for age. Electronically Signed  By: Elon Alas M.D.   On: 01/19/2018 23:18   US Renal  Result Date: 01/22/2018 CLINICAL DATA:  Chronic renal disease. EXAM: RENAL / URINARY TRACT ULTRASOUND COMPLETE COMPARISON:  None. FINDINGS: Right Kidney: Length: 11.2 cm. Minimal perinephric fluid adjacent to the upper pole. No hydronephrosis. Increased cortical echogenicity. Left Kidney: Length: 10.8 cm. Minimal perinephric fluid is seen adjacent to the lower pole. Increased cortical  echogenicity. Bladder: Appears normal for degree of bladder distention. IMPRESSION: 1. Medical renal disease. 2. Minimal perinephric fluid seen adjacent to both kidneys, a nonspecific finding. Electronically Signed   By: Dorise Bullion III M.D   On: 01/22/2018 12:16   Milus Banister, [place], PGY-1 01/22/2018 4:38 PM [place] Intern pager: [phone], text pages welcome

## 2018-01-22 NOTE — Progress Notes (Signed)
Family Medicine Teaching Service Daily Progress Note Intern Pager: [phone]  Patient name: Jeremiah [name] Medical record number: [id number] Date of birth: Jun 22, 1966 Age: 52 y.o. Gender: male  Primary Care Provider: Bufford Lope, DO Consultants: None Code Status: Full  Pt Overview and Major Events to Date:  7/27 - Admit  Assessment and Plan: Rande L Elliottis a 52 y.o.malepresenting with blurred vision. PMH is significant forhypertension,poorly controlled diabetes, Stroke, CKD, Hyperlipidemia, and poor medication compliance.  Hypertensiveemergencywith visual disturbance, improving:  Uncontrolled diabetes contributing to blurry vision. BP on admission 211/103. 7/30 am BP 167/85. - Continue Amlodipine 10 mg daily - Coreg 6.25 mg - Hydralazine 10 mg q4 prn - Modified stroke scale - CBC / CMP - Neuro checks q4 hours - Aspirin 81mg  - Tylenol PRN  RLLPneumoniaconcerningCAP, resolved: patient satting 100% on RA. - Continue Azithromycin and cefdinir until 7/31. - Monitor respiratory status  T2DM, uncontrolled: A1c 13.9.A1c on 09/28/17 was >15%.CBG on admission247. Gabapentin for neuropathy. Prescribed Tresiba16 UandLiraglutide, but taking friend's Lantus.  - POCTCBG's AC/qHS - sSSIand Lantus 12 Units qHS -Holding gabapentin, reduced dose given kidney function prior to discharge  Newsystolic murmur: Patient with new systolic 3/6 murmur on exam. Echo with EF 65-70%, G1DD, severe LV wall thickening. Consideration for HOCM vs infiltrative Cardiomyopathy. Uncontrolled HTN likely contributing factor. - Outpatient cardiology workup - Monitor on telemetry - Monitor vitals  CKD,Stage 4: Cr on admission 4.10.Most recent creatinine 3.92 > 3.88 (7/30).Appears baseline has been worsening in the past two years. 2.3-2.5 back in 2018. UA showed proteinuria. Likely secondary to uncontrolled DM, uncontrolled HTN. -Nephrology referral [name] while inpatient -  I&Os -Avoidnephrotoxic meds  Anemia: Iron panel shows Iron 13, sat 8%, low TIBC. Anemia multifactorial in nature with CKD-4 contributing. - Nephrology consult - Ferrous sulfate 325 BID - recheck outpatient in 1 month - Bowel regimen - Follow up on am CBC and BMP  Hyperlipidemia: prescribed atorvastatin40mg  daily at home, patient not taking.Most recent lipid panel shows Chol 229 H, HDL 36, LDL 163  - Atorvastatin 80  FEN/GI:heart-modified Prophylaxis:Lovenox  Disposition:Home pending clinical improvement  Subjective: Patient is seen this morning resting in bed. He reports his vision has improved back to his baseline with right eye better than left. He is able to read signs across the room. He denies headaches, chest pain, cough, shortness of breath, abdominal pain, nausea, vomiting, diarrhea, constipation, and suprapubic tenderness. He states he wishes to go home.  Objective: Vitals:   01/22/18 2040 01/23/18 0532  BP: (!) 157/84 (!) 167/85  Pulse: 81 74  Resp: 18 17  Temp: 98.8 F (37.1 C) 98.2 F (36.8 C)  SpO2: 100% 100%   Physical Exam: Constitutional: He appears well-developed and well-nourished.  HENT:  Head: Normocephalic.  Eyes: EOM are normal.  Neck: Normal range of motion.  Cardiovascular: Normal rate and regular rhythm. 2/6 systolic murmur  Pulmonary/Chest: Effort normal and breath sounds normal.  Abdominal: Soft. Bowel sounds are normal.  Musculoskeletal: Normal range of motion.  Neurological: He is alert.  Skin: Skin is warm. Capillary refill takes less than 2 seconds.  Psychiatric: He has a normal mood and affect.   Laboratory: CBC Latest Ref Rng & Units 01/22/2018 01/21/2018 01/20/2018  WBC 4.0 - 10.5 K/uL 4.1 5.0 3.0(L)  Hemoglobin 13.0 - 17.0 g/dL 8.9(L) 9.8(L) 8.5(L)  Hematocrit 39.0 - 52.0 % 28.6(L) 31.3(L) 27.6(L)  Platelets 150 - 400 K/uL 284 306 251   CMP Latest Ref Rng & Units 01/23/2018 01/22/2018 01/22/2018  Glucose  70 - 99 mg/dL  303(H) 321(H) -  BUN 6 - 20 mg/dL 40(H) 29(H) -  Creatinine 0.61 - 1.24 mg/dL 3.88(H) 3.92(H) -  Sodium 135 - 145 mmol/L 136 134(L) -  Potassium 3.5 - 5.1 mmol/L 4.0 3.8 -  Chloride 98 - 111 mmol/L 106 102 -  CO2 22 - 32 mmol/L 22 23 -  Calcium 8.9 - 10.3 mg/dL 7.6(L) 7.5(L) 7.9(L)  Total Protein 6.5 - 8.1 g/dL - - -  Total Bilirubin 0.3 - 1.2 mg/dL - - -  Alkaline Phos 38 - 126 U/L - - -  AST 15 - 41 U/L - - -  ALT 0 - 44 U/L - - -   Imaging/Diagnostic Tests: XRay Chest 2 View (7/26): Findings suspicious for right lower lobe pneumonia. Radiographic follow-up to resolution is recommended.   Ct Head Wo Contrast (7/26): normal head CT  MR Brain Wo Contrast (7/26): 1. No acute intracranial process on this motion degraded examination.  2. Borderline parenchymal brain volume loss for age.   Renal US (7/29):  1. Medical renal disease.  2. Minimal perinephric fluid seen adjacent to both kidneys, a nonspecific finding.   Milus Banister, [place], PGY-1 01/23/2018 9:09 AM [facility] Intern pager: [phone], text pages welcome
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
## 2018-01-22 NOTE — Progress Notes (Addendum)
Inpatient Diabetes Program Recommendations  AACE/ADA: New Consensus Statement on Inpatient Glycemic Control (2015)  Target Ranges:  Prepandial:   less than 140 mg/dL      Peak postprandial:   less than 180 mg/dL (1-2 hours)      Critically ill patients:  140 - 180 mg/dL    Spoke with patient about diabetes and home regimen for diabetes control. Patient reports that he is followed by the clinic across the street for diabetes management.  Patient reports that he just started taking the Lantus consistently every day 2 weeks ago.  Patient reports due to no insurance patient could not afford [place] or Victoza. Patient reports applying for disability 5 months ago and has not heard back. Patient states that he checks his glucose in the mornnings and usually sees 160-190.   Discussed A1C results 13.9% this admission. Discussed glucose and A1C goals. Discussed importance of checking CBGs and maintaining good CBG control to prevent long-term and short-term complications. Explained how hyperglycemia leads to damage within blood vessels which lead to the common complications seen with uncontrolled diabetes. Stressed to the patient the importance of improving glycemic control to prevent further complications from uncontrolled diabetes. Discussed impact of nutrition, exercise, stress, sickness, and medications on diabetes control. Discussed carbohydrates, carbohydrate goals per day and meal, along with portion sizes. Patient reports drinking regular Pepsi and Coke all the time. Patient reports he wants to stop drinking all sodas. Encouraged patient to check glucose 2 times per day (fasting and alternating second check).   Patient reports having a glucose meter and having plenty of supplies for it. Gave patient a ReliOn Psychologist, sport and exercise from [place] and discussed back up insulin if needed. Informed patient of CM and SW in the clinic that can verify status of disability and help him fill out the right forms to get  insurance. Patient verbalized understanding of information discussed and he states that he has no further questions at this time related to diabetes.  Thanks,  Tama Headings RN, MSN, [facility] Inpatient Diabetes Coordinator Team Pager [phone] (8a-5p)

## 2018-01-23 DIAGNOSIS — I422 Other hypertrophic cardiomyopathy: Secondary | ICD-10-CM

## 2018-01-23 DIAGNOSIS — N184 Chronic kidney disease, stage 4 (severe): Secondary | ICD-10-CM

## 2018-01-23 LAB — BASIC METABOLIC PANEL
Anion gap: 8 (ref 5–15)
BUN: 40 mg/dL — ABNORMAL HIGH (ref 6–20)
CO2: 22 mmol/L (ref 22–32)
Calcium: 7.6 mg/dL — ABNORMAL LOW (ref 8.9–10.3)
Chloride: 106 mmol/L (ref 98–111)
Creatinine, Ser: 3.88 mg/dL — ABNORMAL HIGH (ref 0.61–1.24)
GFR calc Af Amer: 19 mL/min — ABNORMAL LOW (ref >60)
GFR calc non Af Amer: 16 mL/min — ABNORMAL LOW (ref >60)
Glucose, Bld: 303 mg/dL — ABNORMAL HIGH (ref 70–99)
Potassium: 4 mmol/L (ref 3.5–5.1)
Sodium: 136 mmol/L (ref 135–145)

## 2018-01-23 LAB — PTH, INTACT AND CALCIUM
Calcium, Total (PTH): 7.5 mg/dL — ABNORMAL LOW (ref 8.7–10.2)
PTH: 108 pg/mL — ABNORMAL HIGH (ref 15–65)

## 2018-01-23 LAB — GLUCOSE, CAPILLARY
Glucose-Capillary: 283 mg/dL — ABNORMAL HIGH (ref 70–99)
Glucose-Capillary: 226 mg/dL — ABNORMAL HIGH (ref 70–99)

## 2018-01-23 MED ORDER — INSULIN NPH ISOPHANE & REGULAR (70-30) 100 UNIT/ML ~~LOC~~ SUSP: 8.0000 [IU] | Freq: Two times a day (BID) | SUBCUTANEOUS | 0 refills | Status: DC

## 2018-01-23 MED ORDER — AZITHROMYCIN 250 MG PO TABS: 250.0000 mg | ORAL_TABLET | Freq: Every day | ORAL | 0 refills | Status: DC

## 2018-01-23 MED ORDER — CALCITRIOL 0.25 MCG PO CAPS: 0.2500 ug | ORAL_CAPSULE | Freq: Every day | ORAL | 0 refills | Status: DC

## 2018-01-23 MED ORDER — AMLODIPINE BESYLATE 10 MG PO TABS: 10.0000 mg | ORAL_TABLET | Freq: Every day | ORAL | 0 refills | Status: DC

## 2018-01-23 MED ORDER — CARVEDILOL 12.5 MG PO TABS: 12.5000 mg | ORAL_TABLET | Freq: Two times a day (BID) | ORAL | 1 refills | Status: DC

## 2018-01-23 MED ORDER — ATORVASTATIN CALCIUM 40 MG PO TABS: 40.0000 mg | ORAL_TABLET | Freq: Every day | ORAL | 3 refills | Status: DC

## 2018-01-23 MED ORDER — CEFDINIR 300 MG PO CAPS: 300.0000 mg | ORAL_CAPSULE | Freq: Every day | ORAL | 0 refills | Status: DC

## 2018-01-23 MED ORDER — GLUCERNA SHAKE PO LIQD
237.0000 mL | Freq: Two times a day (BID) | ORAL | Status: DC
  Administered 2018-01-23 (×2): 237 mL via ORAL

## 2018-01-23 MED ORDER — INSULIN NPH ISOPHANE & REGULAR (70-30) 100 UNIT/ML ~~LOC~~ SUSP: 8.0000 [IU] | Freq: Three times a day (TID) | SUBCUTANEOUS | 0 refills | Status: DC

## 2018-01-23 MED ORDER — CALCITRIOL 0.25 MCG PO CAPS
0.2500 ug | ORAL_CAPSULE | Freq: Every day | ORAL | Status: DC
  Administered 2018-01-23: 0.25 ug via ORAL
  Filled 2018-01-23: qty 1

## 2018-01-23 MED ORDER — FERROUS SULFATE 325 (65 FE) MG PO TABS: 325.0000 mg | ORAL_TABLET | Freq: Two times a day (BID) | ORAL | 0 refills | Status: DC

## 2018-01-23 MED ORDER — CARVEDILOL 12.5 MG PO TABS: 12.5000 mg | ORAL_TABLET | Freq: Two times a day (BID) | ORAL | Status: DC

## 2018-01-23 NOTE — Progress Notes (Signed)
Patient was discharged home by MD order; discharged instructions  review and give to patient with care notes; IV DIC; skin intact; patient refused to be escorted to the car by staff. He said he will walk to his car.

## 2018-01-23 NOTE — Discharge Summary (Signed)
[facility] Discharge Summary  Patient name: Jeremiah [name] Medical record number: [id number] Date of birth: 1965-07-17 Age: 52 y.o. Gender: male Date of Admission: 01/19/2018  Date of Discharge: 01/23/2018 Admitting Physician: Kinnie Feil, MD  Primary Care Provider: Bufford Lope, DO Consultants: Nephro, CM, CSW  Indication for Hospitalization: Blurred vision  Discharge Diagnoses/Problem List:  Hypertension Pneumonia T2DM, uncontrolled New systolic murmur CKD, Stage 4 Anemia Hyperlipidemia  Disposition: Discharge home with close follow up  Discharge Condition: Stable  Discharge Exam:  Constitutional: He appears well-developed and well-nourished.  HENT:  Head: Normocephalic.  Eyes: EOM are normal.  Neck: Normal range of motion.  Cardiovascular: Normal rate and regular rhythm. 2/6 systolic murmur  Pulmonary/Chest: Effort normal and breath sounds normal.  Abdominal: Soft. Bowel sounds are normal.  Musculoskeletal: Normal range of motion.  Neurological: He is alert.  Skin: Skin is warm. Capillary refill takes less than 2 seconds.  Psychiatric: He has a normal mood and affect.   Brief Hospital Course:  Patient was admitted for blurred vision and had BP 211/103 on admission. He was given labetalol and his BP and vision improved. Patient has a lengthy health history but poor follow up due to financial hardship. The patient was also recently visiting a friend with pneumonia and came in with cough and shortness of breath. CXR revealed a RLL pneumonia treated with Cefdenir and Azithromycin. Physical exam revealed a new murmur and risk stratification labs revealed Hyperlipidemia, chronic anemia, AKI and high A1c. An Echo was performed to evaluate the patient's new murmur and the study revealed hypertrophic features of an indeterminate source or cause. The patient was encouraged to follow up with cardiology outpatient and to control his blood pressure.  Nephrology performed a renal ultrasound regarding the patient's worsening kidney function and determined the lower function was due to uncontrolled hypertension and diabetes. The patient was medical cleared and discharged home with instructions for follow up and assistance from case management and social work.  Issues for Follow Up:  1. Diabetes: discharged patient on Novolin 70/30 after lengthy discussion about affording Lantus and other DM medications. Please ensure adherence to medications and follow up appointments and review CBGs. 2. Follow up with ophthalmology for blurred vision 3. Blood pressure is still elevated. Monitor BP on Coreg 12.5 BID and Norvasc 10. If blood pressure is still not controlled, would recommend clonidine or hydralazine as next BP medication.  4. See Cardiology for outpatient work up for possible hypertrophic cardiomyopathy. 5. Complete regimen of Cefdinir and Azithromycin for pneumonia. 6. Continue taking all medications for chronic conditions.  7. Follow with nephrology for CKD.  Significant Procedures: none  Significant Labs and Imaging:  Recent Labs  Lab 01/20/18 0333 01/21/18 0815 01/22/18 0807  WBC 3.0* 5.0 4.1  HGB 8.5* 9.8* 8.9*  HCT 27.6* 31.3* 28.6*  PLT 251 306 284   Recent Labs  Lab 01/19/18 1718 01/19/18 1738 01/20/18 0333 01/21/18 0815 01/22/18 0807 01/22/18 1110 01/23/18 0808  NA 134* 134* 136 135 134*  --  136  K 4.2 4.1 3.6 3.7 3.8  --  4.0  CL 103 102 104 106 102  --  106  CO2 23  --  25 20* 23  --  22  GLUCOSE 251* 247* 243* 253* 321*  --  303*  BUN 28* 27* 25* 24* 29*  --  40*  CREATININE 3.83* 4.10* 3.74* 3.53* 3.92*  --  3.88*  CALCIUM 8.1*  --  8.0* 8.0*  7.9* 7.5* 7.6*  PHOS  --   --   --   --   --  3.4  --   ALKPHOS 80  --   --   --   --   --   --   AST 18  --   --   --   --   --   --   ALT 11  --   --   --   --   --   --   ALBUMIN 2.1*  --   --   --   --   --   --     Imaging/Diagnostic Tests: XRay Chest 2 View  (7/26): Findings suspicious for right lower lobe pneumonia. Radiographic follow-up to resolution is recommended.   Ct Head Wo Contrast (7/26): normal head CT  MR Brain Wo Contrast (7/26): 1. No acute intracranial process on this motion degraded examination.  2. Borderline parenchymal brain volume loss for age.   Renal US (7/29):  1. Medical renal disease.  2. Minimal perinephric fluid seen adjacent to both kidneys, a nonspecific finding.   Results/Tests Pending at Time of Discharge: none  Discharge Medications:  Allergies as of 01/23/2018   No Known Allergies     Medication List    STOP taking these medications   insulin degludec 100 UNIT/ML Sopn FlexTouch Pen Commonly known as:  TRESIBA FLEXTOUCH   LANTUS SOLOSTAR 100 UNIT/ML Solostar Pen Generic drug:  Insulin Glargine   liraglutide 18 MG/3ML Sopn Commonly known as:  VICTOZA     TAKE these medications   amLODipine 10 MG tablet Commonly known as:  NORVASC Take 1 tablet (10 mg total) by mouth daily. Start taking on:  01/24/2018   aspirin EC 81 MG tablet Take 1 tablet (81 mg total) by mouth daily.   atorvastatin 40 MG tablet Commonly known as:  LIPITOR Take 1 tablet (40 mg total) by mouth daily.   azithromycin 250 MG tablet Commonly known as:  ZITHROMAX Take 1 tablet (250 mg total) by mouth daily. Start taking on:  01/24/2018   blood glucose meter kit and supplies Kit Dispense based on patient and insurance preference. Use up to four times daily as directed. (FOR ICD-9 250.00, 250.01).   calcitRIOL 0.25 MCG capsule Commonly known as:  ROCALTROL Take 1 capsule (0.25 mcg total) by mouth daily.   carvedilol 12.5 MG tablet Commonly known as:  COREG Take 1 tablet (12.5 mg total) by mouth 2 (two) times daily with a meal.   cefdinir 300 MG capsule Commonly known as:  OMNICEF Take 1 capsule (300 mg total) by mouth daily. Start taking on:  01/24/2018   ferrous sulfate 325 (65 FE) MG tablet Take 1 tablet (325 mg  total) by mouth 2 (two) times daily with a meal.   gabapentin 300 MG capsule Commonly known as:  NEURONTIN Take 1 capsule (300 mg total) by mouth 3 (three) times daily.   insulin NPH-regular Human (70-30) 100 UNIT/ML injection Commonly known as:  NOVOLIN 70/30 Inject 8 Units into the skin 2 (two) times daily with a meal.       Discharge Instructions: Please refer to Patient Instructions section of EMR for full details.  Patient was counseled important signs and symptoms that should prompt return to medical care, changes in medications, dietary instructions, activity restrictions, and follow up appointments.   Follow-Up Appointments: Follow-up [place]. Go on 01/26/2018.   Specialty:  Family Medicine  Why:  at 2:30 to discuss medications Contact information: [address] [id number] [place], [place], DO. Schedule an appointment as soon as possible for a visit.   Specialty:  Family Medicine Contact information: [place] [address] [place], [place], PGY-1 01/23/2018 4:55 PM

## 2018-01-23 NOTE — Care Management Note (Addendum)
Case Management Note  Patient Details  Name: Jeremiah [name] MRN: [id number] Date of Birth: 1965-09-01  Subjective/Objective:         Admitted with Hypertensive emergency. Resides with girlfriend , Kennyth Lose. Independent with ADL's , no DME usage PTA. 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 Pt states has applied for disability.     01/23/2018 @1250  MD informed NCM [facility]  will provide primary care for pt and assist with medication needs. NCM will cancel appointment @ [facility] for 02/20/2018.   Action/Plan: Transition to home when medically stable. Pt without PCP, NCM scheduled post hospital f/u scheduled for 02/20/2018 at 2:30 pm, [facility].  NCM will continue to follow regarding medication needs at d/c....will probably need Match Letter.  Expected Discharge Date:  01/22/18               Expected Discharge Plan:  Home/Self Care  In-House Referral:     Discharge planning Services  CM Consult, [facility]  Post Acute Care Choice:    Choice offered to:     DME Arranged:   N/A DME Agency:   N/A  HH Arranged:   N/A HH Agency:   /N/A  Status of Service:  In process  If discussed at Long Length of Stay Meetings, dates discussed:    Additional Comments:  Sharin Mons, RN 01/23/2018, 10:02 AM

## 2018-01-23 NOTE — Progress Notes (Addendum)
[facility] ROUNDING NOTE   Subjective:   Interval History: Feeling much better this morning with no acute complaints. Eager to go home.   Objective:  Vital signs in last 24 hours:  Temp:  [98.2 F (36.8 C)-98.8 F (37.1 C)] 98.2 F (36.8 C) (07/30 0532) Pulse Rate:  [74-82] 74 (07/30 0532) Resp:  [17-18] 17 (07/30 0532) BP: (151-167)/(84-98) 167/85 (07/30 0532) SpO2:  [100 %] 100 % (07/30 0532)  Weight change:  Filed Weights   01/19/18 1709  Weight: 175 lb (79.4 kg)    Intake/Output: I/O last 3 completed shifts: In: 1656.1 [P.O.:580; I.V.:1076.1] Out: 1330 [Urine:1330]   Intake/Output this shift:  Total I/O In: 60 [P.O.:60] Out: 275 [Urine:275]  General: pleasant, well-developed, well-nourished male in NAD  CVS- RRR RS- CTAB ABD- BS present soft non-distended EXT- no edema   Basic Metabolic Panel: Recent Labs  Lab 01/19/18 1718 01/19/18 1738 01/20/18 0333 01/21/18 0815 01/22/18 0807 01/22/18 1110 01/23/18 0808  NA 134* 134* 136 135 134*  --  136  K 4.2 4.1 3.6 3.7 3.8  --  4.0  CL 103 102 104 106 102  --  106  CO2 23  --  25 20* 23  --  22  GLUCOSE 251* 247* 243* 253* 321*  --  303*  BUN 28* 27* 25* 24* 29*  --  40*  CREATININE 3.83* 4.10* 3.74* 3.53* 3.92*  --  3.88*  CALCIUM 8.1*  --  8.0* 8.0* 7.9* 7.5* 7.6*  PHOS  --   --   --   --   --  3.4  --     Liver Function Tests: Recent Labs  Lab 01/19/18 1718  AST 18  ALT 11  ALKPHOS 80  BILITOT 0.4  PROT 5.3*  ALBUMIN 2.1*   No results for input(s): LIPASE, AMYLASE in the last 168 hours. No results for input(s): AMMONIA in the last 168 hours.  CBC: Recent Labs  Lab 01/19/18 1718 01/19/18 1738 01/20/18 0333 01/21/18 0815 01/22/18 0807  WBC 3.8*  --  3.0* 5.0 4.1  NEUTROABS 2.8  --   --   --   --   HGB 8.7* 8.2* 8.5* 9.8* 8.9*  HCT 27.9* 24.0* 27.6* 31.3* 28.6*  MCV 75.6*  --  75.2* 74.0* 74.1*  PLT 268  --  251 306 284    Cardiac Enzymes: No results for input(s):  CKTOTAL, CKMB, CKMBINDEX, TROPONINI in the last 168 hours.  BNP: Invalid input(s): POCBNP  CBG: Recent Labs  Lab 01/22/18 0733 01/22/18 1318 01/22/18 1823 01/22/18 2137 01/23/18 0800  GLUCAP 276* 280* 193* 323* 283*    Microbiology: Results for orders placed or performed during the hospital encounter of 07/15/16  Blood Cultures x 2 sites     Status: None   Collection Time: 07/15/16  4:50 AM  Result Value Ref Range Status   Specimen Description BLOOD RIGHT ANTECUBITAL  Final   Special Requests IN PEDIATRIC BOTTLE 4CC  Final   Culture NO GROWTH 5 DAYS  Final   Report Status 07/20/2016 FINAL  Final  Blood Cultures x 2 sites     Status: None   Collection Time: 07/15/16  5:46 AM  Result Value Ref Range Status   Specimen Description BLOOD RIGHT HAND  Final   Special Requests BOTTLES DRAWN AEROBIC AND ANAEROBIC 5CC  Final   Culture NO GROWTH 5 DAYS  Final   Report Status 07/20/2016 FINAL  Final    Coagulation Studies: No results for  input(s): LABPROT, INR in the last 72 hours.  Urinalysis: No results for input(s): COLORURINE, LABSPEC, PHURINE, GLUCOSEU, HGBUR, BILIRUBINUR, KETONESUR, PROTEINUR, UROBILINOGEN, NITRITE, LEUKOCYTESUR in the last 72 hours.  Invalid input(s): APPERANCEUR    Imaging: US Renal  Result Date: 01/22/2018 CLINICAL DATA:  Chronic renal disease. EXAM: RENAL / URINARY TRACT ULTRASOUND COMPLETE COMPARISON:  None. FINDINGS: Right Kidney: Length: 11.2 cm. Minimal perinephric fluid adjacent to the upper pole. No hydronephrosis. Increased cortical echogenicity. Left Kidney: Length: 10.8 cm. Minimal perinephric fluid is seen adjacent to the lower pole. Increased cortical echogenicity. Bladder: Appears normal for degree of bladder distention. IMPRESSION: 1. Medical renal disease. 2. Minimal perinephric fluid seen adjacent to both kidneys, a nonspecific finding. Electronically Signed   By: Dorise Bullion III M.D   On: 01/22/2018 12:16     Medications:   .  sodium chloride 75 mL/hr at 01/23/18 0355   . amLODipine  10 mg Oral Daily  . aspirin EC  81 mg Oral Daily  . atorvastatin  80 mg Oral Daily  . azithromycin  250 mg Oral Daily  . carvedilol  6.25 mg Oral BID WC  . cefdinir  300 mg Oral Daily  . enoxaparin (LOVENOX) injection  30 mg Subcutaneous Q24H  . feeding supplement (GLUCERNA SHAKE)  237 mL Oral BID BM  . ferrous sulfate  325 mg Oral BID WC  . insulin aspart  0-9 Units Subcutaneous TID WC  . insulin glargine  12 Units Subcutaneous QHS  . senna  1 tablet Oral Daily   acetaminophen **OR** acetaminophen, hydrALAZINE, ondansetron (ZOFRAN) IV  Assessment/ Plan:  Patient is a 52 y.o. male with a PMHx of CKD Stage II, uncontrolled T2DM, uncontrolled HTN, hx of CVA, and HLD who was admitted to [facility] on 01/19/2018 for hypertensive emergency and hyperglycemia.   1. 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 Most recent Creatinine 3.38 prior to admission (09/2017). Renal function remains stable with Cr 3.88 and UOP improving, 1.3L. Electrolytes within normal limits. Phosphorus 3.4.  - Renal US with increased cortical echogenicity bilaterally  - PTH 108 and Ca 7.5, recommend starting calcitriol 0.25 mcg QD if patient able to afford it  - Strict I/Os and daily weights - Avoid nephrotoxic agents such as NSAIDs and contrast   2. HTN: Presented with sBP in the 200s. Now improving with sBP 150-160s. Started on Coreg 6.25 mg BID and amlodipine 10 mg QD during this admission.   3. Anemia: Hgb 8-9 at baseline. No indication to start Aranesp at this time.   4. T2DM: Uncontrolled due to inability to afford medications. Per primary.   5. Hx of CVA: On ASA 81 mg QD.   6. HLD: On atorvastatin 80 mg QD   7. CAP: Afebrile and oxygenating well on room air. Lungs clear to auscultation. On cefdinir and azithromycin.   I saw the patient and personally evaluated him.  His BPs are reasonable now - I think  getting his BP down to goal 130/80 over several months is reasonable.  I've contacted clinic to arrange f/u with myself in about 2 months for ongoing CKD care.  Ok to discharge from our perspective.    LOS: 4 Idalys Santos-Sanchez 01/23/2018 9:45 AM

## 2018-01-23 NOTE — Progress Notes (Signed)
Inpatient Diabetes Program Recommendations  AACE/ADA: New Consensus Statement on Inpatient Glycemic Control (2015)  Target Ranges:  Prepandial:   less than 140 mg/dL      Peak postprandial:   less than 180 mg/dL (1-2 hours)      Critically ill patients:  140 - 180 mg/dL   Results for KELSEN, CELONA (MRN [id number]) as of 01/23/2018 09:36  Ref. Range 01/22/2018 07:33 01/22/2018 13:18 01/22/2018 18:23 01/22/2018 21:37 01/23/2018 08:00  Glucose-Capillary Latest Ref Range: 70 - 99 mg/dL 276 (H) 280 (H) 193 (H) 323 (H) 283 (H)   Review of Glycemic Control  Diabetes history: DM 2 Outpatient Diabetes medications: Lantus 16 units, friends insulin Current orders for Inpatient glycemic control: Lantus 12 units, Novolog 0-9 units tid  Inpatient Diabetes Program Recommendations:    Noted fasting glucose 270's this am on Lantus 12 units. Consider increasing Lantus to 16-18 units. Due to patient not having insurance will need 70/30 more than likely at time of d/c, unless they can follow up at one of the clinics. 70/30 will also help to cover what patient eats at home.   Thanks,  Tama Headings RN, MSN, BC-ADM Inpatient Diabetes Coordinator Team Pager [phone] (8a-5p)

## 2018-01-25 ENCOUNTER — Ambulatory Visit: Payer: Medicaid Other | Admitting: Pharmacist

## 2018-02-20 ENCOUNTER — Inpatient Hospital Stay (INDEPENDENT_AMBULATORY_CARE_PROVIDER_SITE_OTHER): Payer: Medicaid Other | Admitting: Physician Assistant

## 2018-05-30 ENCOUNTER — Encounter: Payer: Self-pay | Admitting: Family Medicine

## 2018-05-30 ENCOUNTER — Ambulatory Visit (INDEPENDENT_AMBULATORY_CARE_PROVIDER_SITE_OTHER): Payer: Medicaid Other | Admitting: Family Medicine

## 2018-05-30 ENCOUNTER — Other Ambulatory Visit: Payer: Self-pay

## 2018-05-30 VITALS — BP 148/78 | HR 83 | Temp 98.1°F | Ht 72.0 in | Wt 168.4 lb

## 2018-05-30 DIAGNOSIS — I422 Other hypertrophic cardiomyopathy: Secondary | ICD-10-CM | POA: Diagnosis not present

## 2018-05-30 DIAGNOSIS — E1165 Type 2 diabetes mellitus with hyperglycemia: Secondary | ICD-10-CM

## 2018-05-30 DIAGNOSIS — Z23 Encounter for immunization: Secondary | ICD-10-CM | POA: Diagnosis not present

## 2018-05-30 LAB — POCT GLYCOSYLATED HEMOGLOBIN (HGB A1C): HbA1c, POC (controlled diabetic range): 11.6 % — AB (ref 0.0–7.0)

## 2018-05-30 NOTE — Patient Instructions (Addendum)
Go back on novolin 70/30 8units twice a day. Check your sugars twice a day and then come back to our pharmacy clinic In 2 weeks.   We will send you to see a cardiologist, if you have not hear back about scheduling an appointment after 2 weeks then please give [place] a call.

## 2018-05-30 NOTE — Progress Notes (Signed)
    Subjective:  Jeremiah [name] is a 52 y.o. male who presents to the [facility] today for diabetes follow up  HPI:  SUBJECTIVE: 52 y.o. male for follow up of diabetes. Diabetic Review of Systems - medication compliance: not great. He has been switching from his home 70/30 to his friend's leftover lantus based on availability. home glucose monitoring: is performed sporadically, he has noticed that sine most recently he has been taking his friend's lantus that his sugars have been better than usual in the low 200s. He does not think he will be able to afford lantus when he runs out. He does think he will be able to afford novolin 70/30. diabetic diet compliance: noncompliant some of the time,  further diabetic ROS: no chest pain, dyspnea or TIA's, no hypoglycemia He saw an eye doctor recently given his blurry vision and was told he needs cataract surgery  He never saw cardiology after his last hospitalization when he was found to have a murmur. He denies CP, palpitations, syncope, SOB or fatigue.  ROS: Per HPI  Social Hx: He reports that he has never smoked. He has never used smokeless tobacco. He reports that he has current or past drug history. Drug: Marijuana. He reports that he does not drink alcohol.   Objective:  Physical Exam: BP (!) 148/78   Pulse 83   Temp 98.1 F (36.7 C) (Oral)   Ht 6' (1.829 m)   Wt 168 lb 6 oz (76.4 kg)   SpO2 99%   BMI 22.84 kg/m   Gen: NAD, resting comfortably CV: regular, s1 and s2. 3/6 systolic murmur Pulm: NWOB, CTAB with no crackles, wheezes, or rhonchi GI: Normal bowel sounds present. Soft, Nontender, Nondistended. MSK: no edema, cyanosis, or clubbing noted Skin: warm, dry Neuro: grossly normal, moves all extremities Psych: Normal affect and thought content  Results for orders placed or performed in visit on 05/30/18 (from the past 72 hour(s))  POCT glycosylated hemoglobin (Hb A1C)     Status: Abnormal   Collection Time: 05/30/18 10:34 AM    Result Value Ref Range   Hemoglobin A1C     HbA1c POC (<> result, manual entry)     HbA1c, POC (prediabetic range)     HbA1c, POC (controlled diabetic range) 11.6 (A) 0.0 - 7.0 %     Assessment/Plan:  Poorly controlled type 2 diabetes mellitus Longstanding uncontrolled. His improvement in his a1c recently comes from lantus. Unfortunately he cannot financially stay on lantus and will need to go back to NPH 70/30. Recommended resumption of 8U BID and regular home glucose monitoring and return for insulin titration in pharmacy clinic in 2 weeks.  Other hypertrophic cardiomyopathy ([place]) Patient was found to have hypertrophic features of an indeterminate source or cause on echo 01/20/18 based on  severe LV wall thickening. Referral to cardiology placed.   Bufford Lope, DO PGY-3, [facility] 05/30/2018 10:48 AM

## 2018-05-30 NOTE — Assessment & Plan Note (Signed)
Patient was found to have hypertrophic features of an indeterminate source or cause on echo 01/20/18 based on  severe LV wall thickening. Referral to cardiology placed.

## 2018-05-30 NOTE — Assessment & Plan Note (Signed)
Longstanding uncontrolled. His improvement in his a1c recently comes from lantus. Unfortunately he cannot financially stay on lantus and will need to go back to NPH 70/30. Recommended resumption of 8U BID and regular home glucose monitoring and return for insulin titration in pharmacy clinic in 2 weeks.

## 2018-06-27 HISTORY — PX: EYE SURGERY: SHX253

## 2018-07-08 ENCOUNTER — Other Ambulatory Visit (HOSPITAL_COMMUNITY): Payer: Self-pay | Admitting: Family Medicine

## 2018-07-10 NOTE — Telephone Encounter (Signed)
Needs diabetes follow up appt

## 2018-07-10 NOTE — Telephone Encounter (Signed)
Attempted to contact pt to inform of need for apt. No answer and no option for VM. Please assist in scheduling if he calls back with Shawna Orleans.

## 2018-08-02 ENCOUNTER — Ambulatory Visit: Payer: Medicaid Other | Admitting: Pharmacist

## 2018-08-23 ENCOUNTER — Encounter: Payer: Self-pay | Admitting: Pharmacist

## 2018-08-23 ENCOUNTER — Ambulatory Visit (INDEPENDENT_AMBULATORY_CARE_PROVIDER_SITE_OTHER): Payer: Medicaid Other | Admitting: Pharmacist

## 2018-08-23 VITALS — BP 188/88 | HR 71 | Ht 72.0 in | Wt 168.0 lb

## 2018-08-23 DIAGNOSIS — E1165 Type 2 diabetes mellitus with hyperglycemia: Secondary | ICD-10-CM

## 2018-08-23 MED ORDER — INSULIN GLARGINE 100 UNIT/ML SOLOSTAR PEN: 20.0000 [IU] | PEN_INJECTOR | Freq: Every day | SUBCUTANEOUS | 0 refills | Status: DC

## 2018-08-23 MED ORDER — INSULIN LISPRO (1 UNIT DIAL) 100 UNIT/ML (KWIKPEN): 5.0000 [IU] | PEN_INJECTOR | Freq: Three times a day (TID) | SUBCUTANEOUS | 11 refills | Status: DC

## 2018-08-23 MED ORDER — PEN NEEDLES 32G X 4 MM MISC: 100.0000 "pen " | Freq: Four times a day (QID) | 2 refills | Status: DC

## 2018-08-23 MED ORDER — INSULIN GLARGINE 100 UNIT/ML SOLOSTAR PEN: 20.0000 [IU] | PEN_INJECTOR | Freq: Every day | SUBCUTANEOUS | 1 refills | Status: DC

## 2018-08-23 NOTE — Patient Instructions (Addendum)
It was great to see you today!  We started Lantus (insulin glargine) as the long acting insulin and did 20 units today in office. We will talk tomorrow about what dose to do moving forward.   We are also starting a rapid acting insulin called Humalog. Take 5 units of this right before you eat a meal. Do not use Humalog if you skip the meal.   Schedule follow up tomorrow with a provider here.

## 2018-08-23 NOTE — Progress Notes (Signed)
Patient ID: Jeremiah [name], male   DOB: 1966/02/20, 53 y.o.   MRN: [id number] Reviewed: Agree with Dr. Graylin Shiver documentation and management.

## 2018-08-23 NOTE — Progress Notes (Signed)
S:     Chief Complaint  Patient presents with  . Medication Management    Diabetes    Patient arrives for a work in visit due to running out of insulin a week ago. He is well appearing.   Presents for diabetes evaluation, education, and management originally at the request of previous PCP Dr. [place] Schimke (referred 09/28/17). Last seen by his new PCP Dr. Shawna Orleans on 05/30/2018. At that time, it was noted that he was unable to afford Lantus and could afford insulin 70/30.   Today, he notes that he ran out of insulin 70/30 about a week ago. He had been taking 20 units in the morning, and based on blood sugars, would take 5-10 units in the evenings. Sugars were in the low 200s on that dose. Over the past week without insulin, he reports blood sugars in the 300s-500s, with his reading this morning being 540. He notes polydipsia, nocturia up to 4x/night, and weight loss over the past week. He reports nausea with vomiting yesterday evening. Notes that he is dizzy, though states this has been going on for a few weeks. Reports that it is worst when he first wakes up and in the evenings.   He reports that he saw the nephrologist yesterday and they noted that he should have a fistula placed for eventual dialysis in the next year.   In regards to elevated blood pressure today in clinic, he denies chest pain, headaches. He notes that he took amlodipine last night and carvedilol right before coming to the appointment.  Insurance coverage/medication affordability: picked up Medicaid this month   Patient denies adherence with medications- out of insulin for 1 week Current diabetes medications include: insulin 70/30 20 units QAM, 5-10 QPM Current hypertension medications include: carvedilol 12.5 mg BID, amlodipine 10 mg QHS  Patient reports hypoglycemic events. - Called EMS 2 months ago for hypoglycemia; did not go to the hospital as it resolved with treatment.   Patient reported dietary habits: Eats 3  meals/day Breakfast: varies Lunch: Sandwich; fruits (apples, grapes, bananas) Dinner: eats around 10 pm (steak, green beans, potatoes)     Patient reports nocturia 4x/night.  Patient reports neuropathy. Patient reports visual changes. Patient reports self foot exams.    O:  Physical Exam Constitutional:      Appearance: Normal appearance.  Neurological:     Mental Status: He is alert.    Review of Systems  Gastrointestinal: Positive for nausea.  Neurological: Positive for dizziness.    Lab Results  Component Value Date   HGBA1C 11.6 (A) 05/30/2018   Vitals:   08/23/18 1124  BP: (!) 188/88  Pulse: 71  SpO2: 98%   Lipid Panel     Component Value Date/Time   CHOL 229 (H) 01/20/2018 0333   TRIG 150 (H) 01/20/2018 0333   HDL 36 (L) 01/20/2018 0333   CHOLHDL 6.4 01/20/2018 0333   VLDL 30 01/20/2018 0333   LDLCALC 163 (H) 01/20/2018 0333   LDLDIRECT 166 (H) 07/23/2013 1520    Home fasting CBG: 549 this morning; 300s-500s this past week  Clinical ASCVD: Yes  - hx CVA   A/P: Diabetes longstanding currently severely uncontrolled as patient has been without insulin for ~1 week. Precepted with Dr. Mingo Amber; Patient does report nausea/vomiting and significant polydipsia, nocturia, though is well appearing. He has lost ~10 lbs since 12/2017, but about 40 lbs in the past 2 years.  - Administered Lantus 20 units in office. Will continue daily Lantus.  -  Start Humalog 5 units TID with meals. Counseled to skip if he does not eat a meal.  - Check BMP.  - Counseled on s/sx of and management of hypoglycemia - Patient agrees to follow up in clinic tomorrow morning for further insulin titration. Scheduled in ATC clinic early AM with Dr. Shan Levans.    Written patient instructions provided.  Total time in face to face counseling 30 minutes.   Follow up ATC Clinic Visit tomorrow.   Patient seen with Janae Bridgeman, PharmD, PGY1 Pharmacy Resident and Catie Darnelle Maffucci, PharmD, PGY2 Pharmacy  Resident.

## 2018-08-23 NOTE — Assessment & Plan Note (Signed)
Diabetes longstanding currently severely uncontrolled as patient has been without insulin for ~1 week. Precepted with Dr. Mingo Amber; Patient does report nausea/vomiting and significant polydipsia, nocturia, though is well appearing. He has lost ~10 lbs since 12/2017, but about 40 lbs in the past 2 years.  - Administered Lantus 20 units in office. Will continue daily Lantus.  - Start Humalog 5 units TID with meals. Counseled to skip if he does not eat a meal.  - Check BMP.  - Counseled on s/sx of and management of hypoglycemia - Patient agrees to follow up in clinic tomorrow morning for further insulin titration. Scheduled in ATC clinic early AM with Dr. Shan Levans.

## 2018-08-24 ENCOUNTER — Other Ambulatory Visit: Payer: Self-pay

## 2018-08-24 ENCOUNTER — Telehealth: Payer: Self-pay | Admitting: Family Medicine

## 2018-08-24 ENCOUNTER — Telehealth: Payer: Self-pay | Admitting: Pharmacist

## 2018-08-24 ENCOUNTER — Ambulatory Visit (INDEPENDENT_AMBULATORY_CARE_PROVIDER_SITE_OTHER): Payer: Medicaid Other | Admitting: Family Medicine

## 2018-08-24 VITALS — BP 166/74 | HR 87 | Temp 97.5°F | Wt 166.0 lb

## 2018-08-24 DIAGNOSIS — E1165 Type 2 diabetes mellitus with hyperglycemia: Secondary | ICD-10-CM

## 2018-08-24 LAB — POCT GLYCOSYLATED HEMOGLOBIN (HGB A1C): HbA1c, POC (controlled diabetic range): 13.2 % — AB (ref 0.0–7.0)

## 2018-08-24 LAB — BASIC METABOLIC PANEL
BUN/Creatinine Ratio: 10 (ref 9–20)
BUN/Creatinine Ratio: 9 (ref 9–20)
BUN: 64 mg/dL — ABNORMAL HIGH (ref 6–24)
BUN: 66 mg/dL — ABNORMAL HIGH (ref 6–24)
CO2: 15 mmol/L — ABNORMAL LOW (ref 20–29)
CO2: 22 mmol/L (ref 20–29)
Calcium: 7.5 mg/dL — ABNORMAL LOW (ref 8.7–10.2)
Calcium: 7.6 mg/dL — ABNORMAL LOW (ref 8.7–10.2)
Chloride: 105 mmol/L (ref 96–106)
Chloride: 103 mmol/L (ref 96–106)
Creatinine, Ser: 6.66 mg/dL — ABNORMAL HIGH (ref 0.76–1.27)
Creatinine, Ser: 7.08 mg/dL — ABNORMAL HIGH (ref 0.76–1.27)
GFR calc Af Amer: 10 mL/min/{1.73_m2} — ABNORMAL LOW (ref >59)
GFR calc Af Amer: 9 mL/min/{1.73_m2} — ABNORMAL LOW (ref >59)
GFR calc non Af Amer: 9 mL/min/{1.73_m2} — ABNORMAL LOW (ref >59)
GFR calc non Af Amer: 8 mL/min/{1.73_m2} — ABNORMAL LOW (ref >59)
Glucose: 464 mg/dL — ABNORMAL HIGH (ref 65–99)
Glucose: 391 mg/dL — ABNORMAL HIGH (ref 65–99)
Potassium: 4.8 mmol/L (ref 3.5–5.2)
Potassium: 4.2 mmol/L (ref 3.5–5.2)
Sodium: 137 mmol/L (ref 134–144)
Sodium: 134 mmol/L (ref 134–144)

## 2018-08-24 LAB — GLUCOSE, POCT (MANUAL RESULT ENTRY): POC Glucose: 357 mg/dl — AB (ref 70–99)

## 2018-08-24 MED ORDER — INSULIN GLARGINE 100 UNIT/ML SOLOSTAR PEN: 30.0000 [IU] | PEN_INJECTOR | Freq: Every day | SUBCUTANEOUS | 11 refills | Status: DC

## 2018-08-24 NOTE — Assessment & Plan Note (Signed)
Hemoglobin A1c today was 13.6.  Patient was counseled that continuing to have poorly controlled diabetes could cause him to need dialysis, need amputations, and lose his eyesight.  I worked with Dr. Valentina Lucks to titrate patient's Lantus and Humalog doses, which we changed to Lantus 30 units daily and Humalog 8 units with meals unless sugars are 300 or greater, then he will use 10 units.  Lantus 30 units was given in clinic today and he was given the pen to take home.  Patient taught back instructions of insulin use, and instructions were also included on AVS.  A stat BMP was ordered, and patient was counseled that he may need to go to the emergency department if he continues to be acidotic and that someone will call him if his BMP shows this.  Patient expressed understanding.  A follow-up appointment was scheduled with his PCP, Dr. Shawna Orleans, in 1 week.

## 2018-08-24 NOTE — Progress Notes (Signed)
Seen in conjunction with Dr. Valentina Lucks.  Patient looks good, awake, alert.  No complaints.  Diabetic out of his medications for at least several days if not longer.  Had some vomiting episodes earlier this week.  None today.  Eating and drinking well.  Nocturia x 4, which sounds to be chronic even when he was on his insulin.    Plan is to check BMET today.  Start insulin.  Follow up tomorrow for repeat BMET after starting his insulin to check for any persistent acidosis.

## 2018-08-24 NOTE — Patient Instructions (Signed)
It was nice meeting you today Jeremiah [name]!  We are increasing your dose of Lantus to 30 units/day.  Please take this each morning.  We are also increasing your mealtime insulin, also called Humalog, to 8 units 15 minutes before each meal.  If your blood sugar is 300 or greater before your meal, please take 10 units of mealtime insulin instead.  We are getting a blood test today to check your kidneys and electrolytes.  If this shows that your electrolytes are at dangerous levels, you may need to go to the emergency department for further management.  We will call you to let you know about these results later today.  Please return in about 1 week to follow-up on how you are doing.  Please record and write down your blood sugars in the morning before breakfast and before each meal.  If you have any questions or concerns, please feel free to call the clinic.   Be well,  Dr. Shan Levans

## 2018-08-24 NOTE — Telephone Encounter (Signed)
Patient did not show up for clinic visit 2/28. Called patient on all available phone numbers regarding his Bmet results and he did not answer. Voicemail not set up. Discussed patient with Dr. Mingo Amber and he will try calling again this afternoon.  Janae Bridgeman, PharmD PGY1 Pharmacy Resident Phone: [phone] 08/24/2018 10:34 AM

## 2018-08-24 NOTE — Telephone Encounter (Signed)
Voicemail left with patient's significant other, [name], since patient's number does not appear to be functioning.  Also called Ms. Ronnald Ramp' home number and got a busy signal.  I advised Ms. Ronnald Ramp to take Mr. Moten to the ED today since his kidney function has worsened over the last 24 hours.  I encouraged her to call our clinic with any questions or concerns.

## 2018-08-24 NOTE — Progress Notes (Signed)
Subjective:    Jeremiah [name] - 53 y.o. male MRN [id number]  Date of birth: 16-Sep-1965  CC:  Jeremiah [name] is here for follow up of hyperglycemia.  HPI: Poorly controlled type 2 diabetes with anion gap acidosis -Patient was seen yesterday, 2/27, after reporting that he has been out of insulin 70/30 for 1 week -Found to have a serum glucose of 464, creatinine of 6.66, and a bicarbonate level of 15, with an anion gap of 17.  However, patient was not acutely ill and was determined to be suitable for outpatient management -Was given 20 units of Lantus in clinic yesterday and was given prescriptions for Lantus 20 units daily and Humalog 5 units with meals - says that he has had nausea and vomiting over the last few days, but this is gotten better over the last 24 hours - blood sugar this morning, taken at home, was around 340  - has not yet taken Humalog yet but has picked Humalog and Lantus up from the pharmacy -Was told by his pharmacy that he can also get a glucose meter and strips with his insurance -Denies fevers, chest pain, shortness of breath, but does endorse chills -Reports polydipsia and polyuria, which have improved slightly in the last 24 hours -Does follow with a nephrologist  Health Maintenance:  Health Maintenance Due  Topic Date Due  . COLONOSCOPY  06/19/2015  . FOOT EXAM  05/31/2018  . OPHTHALMOLOGY EXAM  07/25/2018    -  reports that he has never smoked. He has never used smokeless tobacco. - Review of Systems: Per HPI. - Past Medical History: Patient Active Problem List   Diagnosis Date Noted  . Stage 4 chronic kidney disease ([place])   . Other hypertrophic cardiomyopathy ([place])   . Hypertensive emergency 01/19/2018  . Financial difficulties 09/28/2017  . Achilles tendinitis 11/27/2014  . Ulnar neuropathy of both upper extremities 08/22/2014  . Diabetes mellitus ([place])   . Stroke ([place])   . Dental infection 07/10/2014  . HLD (hyperlipidemia) 07/25/2013    . 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 Diabetic peripheral neuropathy ([place]) 12/03/2012  . Poorly controlled type 2 diabetes mellitus ([place])   . Hypertension    - Medications: reviewed and updated   Objective:   Physical Exam BP (!) 166/74   Pulse 87   Temp (!) 97.5 F (36.4 C) (Oral)   Wt 166 lb (75.3 kg)   SpO2 99%   BMI 22.51 kg/m  Gen: NAD, alert, cooperative with exam CV: RRR, good S1/S2, no murmur, no edema Resp: CTABL, no wheezes, non-labored Abd: SNTND, BS present, no guarding or organomegaly  Psych: good insight, alert and oriented, does not appear lethargic or uremic     Assessment & Plan:   Poorly controlled type 2 diabetes mellitus Hemoglobin A1c today was 13.6.  Patient was counseled that continuing to have poorly controlled diabetes could cause him to need dialysis, need amputations, and lose his eyesight.  I worked with Dr. Valentina Lucks to titrate patient's Lantus and Humalog doses, which we changed to Lantus 30 units daily and Humalog 8 units with meals unless sugars are 300 or greater, then he will use 10 units.  Lantus 30 units was given in clinic today and he was given the pen to take home.  Patient taught back instructions of insulin use, and instructions were also included on AVS.  A stat BMP was ordered, and patient  was counseled that he may need to go to the emergency department if he continues to be acidotic and that someone will call him if his BMP shows this.  Patient expressed understanding.  A follow-up appointment was scheduled with his PCP, Dr. Shawna Orleans, in 1 week.    Maia Breslow, M.D. 08/24/2018, 1:50 PM PGY-2, [place]

## 2018-08-30 ENCOUNTER — Other Ambulatory Visit (HOSPITAL_COMMUNITY): Payer: Self-pay

## 2018-08-30 ENCOUNTER — Telehealth: Payer: Self-pay | Admitting: Hematology

## 2018-08-30 ENCOUNTER — Ambulatory Visit (INDEPENDENT_AMBULATORY_CARE_PROVIDER_SITE_OTHER): Payer: Medicaid Other | Admitting: Family Medicine

## 2018-08-30 VITALS — BP 140/54 | HR 86 | Temp 98.7°F | Wt 181.8 lb

## 2018-08-30 DIAGNOSIS — L97509 Non-pressure chronic ulcer of other part of unspecified foot with unspecified severity: Secondary | ICD-10-CM

## 2018-08-30 DIAGNOSIS — R6889 Other general symptoms and signs: Secondary | ICD-10-CM | POA: Insufficient documentation

## 2018-08-30 DIAGNOSIS — L97521 Non-pressure chronic ulcer of other part of left foot limited to breakdown of skin: Secondary | ICD-10-CM | POA: Diagnosis not present

## 2018-08-30 DIAGNOSIS — E1165 Type 2 diabetes mellitus with hyperglycemia: Secondary | ICD-10-CM | POA: Diagnosis not present

## 2018-08-30 DIAGNOSIS — E11621 Type 2 diabetes mellitus with foot ulcer: Secondary | ICD-10-CM | POA: Diagnosis not present

## 2018-08-30 NOTE — Assessment & Plan Note (Signed)
Uncontrolled. Was recently restarted on insulin as has been having hypoglycemic events to 37s at home. Discussed decrease lantus to 20U in the morning. Restart home humalog at 5U TID with meals and follow up in 1-2 weeks.

## 2018-08-30 NOTE — Telephone Encounter (Signed)
lmom to inform patient of new patient appt 3/26 at 12 pm. Mailed appt letter on 3/5

## 2018-08-30 NOTE — Progress Notes (Signed)
    Subjective:  Jeremiah [name] is a 53 y.o. male who presents to the [facility] today for diabetes follow up.  HPI:  Patient states that he has been taking his Lantus 30 units every morning since his visit.  He has noticed that his fasting sugars have been in the 80s or 90s.  He has stopped taking his mealtimes altogether because he has noticed some lows at night to the 50s that make him shaky and sweaty. His post prandial sugars have been elevated to the 200s and 300s. He has no chest pain, shortness of breath. He does have numbness and tingling in both feet and does not have much sensation in them. He tries to check them regularly. 1 week ago he banged his left foot on something but he did not feel much of anything at the time.  Later he looked down and noticed that he had an ulcer on the outside of his left foot.  It has not had any drainage or redness.  He has no fevers or chills. He has been keeping it clean with warm water and soap and putting Vaseline over it  Still following with [facility]. Patient states that at his last visit was 2 weeks ago, discussed AVF preparation.   ROS: Per HPI  Social Hx: He reports that he has never smoked. He has never used smokeless tobacco. He reports current drug use. Drug: Marijuana. He reports that he does not drink alcohol.    Objective:  Physical Exam: BP (!) 140/54   Pulse 86   Temp 98.7 F (37.1 C)   Wt 181 lb 12.8 oz (82.5 kg)   SpO2 99%   BMI 24.66 kg/m   Gen: NAD, resting comfortably CV: RRR with no murmurs appreciated Pulm: NWOB, CTAB with no crackles, wheezes, or rhonchi GI: Normal bowel sounds present. Soft, Nontender, Nondistended. MSK: no edema, cyanosis, or clubbing noted Skin: see picture below. No erythema or warmth or drainage.  Diabetic Foot Exam - Simple   Simple Foot Form Diabetic Foot exam was performed with the following findings:  Yes 08/30/2018 11:36 AM  Visual Inspection See comments:  Yes Sensation  Testing See comments:  Yes Pulse Check See comments:  Yes Comments No sensation to monofilament until at ankle. Intact to touch. Very weak PT and DP pulses.  L foot with lateral malleolus ulcer with clean base and no surround erythema.      POC ABI today abnormal in L 0.96, normal in R    Assessment/Plan:  Poorly controlled type 2 diabetes mellitus Uncontrolled. Was recently restarted on insulin as has been having hypoglycemic events to 39s at home. Discussed decrease lantus to 20U in the morning. Restart home humalog at 5U TID with meals and follow up in 1-2 weeks.  Diabetic foot ulcer ([place]) L foot ulcer does not appear infected and patient is afebrile, no systemic symptoms. He has decreased sensation and weak pulses so ABIs were checked today.  Abnormal ankle brachial index (ABI) POC ABI abnormal in L LE today with a noninfected diabetic foot ulcer. No critical limb ischemia as good skin turgor and no pallor. Referred to [facility] heartcare.   Bufford Lope, DO PGY-3, [facility] 08/30/2018 11:27 AM

## 2018-08-30 NOTE — Patient Instructions (Signed)
Lantus 20U in the morning.   Humalog 5U with meals   Come back in 2 weeks with your home log  Keep an eye on your ulcer. Clean with warm water and soap. Vaseline is good.

## 2018-08-30 NOTE — Discharge Instructions (Signed)
Epoetin Alfa injection °What is this medicine? °EPOETIN ALFA (e POE e tin AL fa) helps your body make more red blood cells. This medicine is used to treat anemia caused by chronic kidney disease, cancer chemotherapy, or HIV-therapy. It may also be used before surgery if you have anemia. °This medicine may be used for other purposes; ask your health care provider or pharmacist if you have questions. °COMMON BRAND NAME(S): Epogen, Procrit, Retacrit °What should I tell my health care provider before I take this medicine? °They need to know if you have any of these conditions: °-cancer °-heart disease °-high blood pressure °-history of blood clots °-history of stroke °-low levels of folate, iron, or vitamin B12 in the blood °-seizures °-an unusual or allergic reaction to erythropoietin, albumin, benzyl alcohol, hamster proteins, other medicines, foods, dyes, or preservatives °-pregnant or trying to get pregnant °-breast-feeding °How should I use this medicine? °This medicine is for injection into a vein or under the skin. It is usually given by a health care professional in a hospital or clinic setting. °If you get this medicine at home, you will be taught how to prepare and give this medicine. Use exactly as directed. Take your medicine at regular intervals. Do not take your medicine more often than directed. °It is important that you put your used needles and syringes in a special sharps container. Do not put them in a trash can. If you do not have a sharps container, call your pharmacist or healthcare provider to get one. °A special MedGuide will be given to you by the pharmacist with each prescription and refill. Be sure to read this information carefully each time. °Talk to your pediatrician regarding the use of this medicine in children. While this drug may be prescribed for selected conditions, precautions do apply. °Overdosage: If you think you have taken too much of this medicine contact a poison control center  or emergency room at once. °NOTE: This medicine is only for you. Do not share this medicine with others. °What if I miss a dose? °If you miss a dose, take it as soon as you can. If it is almost time for your next dose, take only that dose. Do not take double or extra doses. °What may interact with this medicine? °Interactions have not been studied. °This list may not describe all possible interactions. Give your health care provider a list of all the medicines, herbs, non-prescription drugs, or dietary supplements you use. Also tell them if you smoke, drink alcohol, or use illegal drugs. Some items may interact with your medicine. °What should I watch for while using this medicine? °Your condition will be monitored carefully while you are receiving this medicine. °You may need blood work done while you are taking this medicine. °This medicine may cause a decrease in vitamin B6. You should make sure that you get enough vitamin B6 while you are taking this medicine. Discuss the foods you eat and the vitamins you take with your health care professional. °What side effects may I notice from receiving this medicine? °Side effects that you should report to your doctor or health care professional as soon as possible: °-allergic reactions like skin rash, itching or hives, swelling of the face, lips, or tongue °-seizures °-signs and symptoms of a blood clot such as breathing problems; changes in vision; chest pain; severe, sudden headache; pain, swelling, warmth in the leg; trouble speaking; sudden numbness or weakness of the face, arm or leg °-signs and symptoms of a stroke   like changes in vision; confusion; trouble speaking or understanding; severe headaches; sudden numbness or weakness of the face, arm or leg; trouble walking; dizziness; loss of balance or coordination °Side effects that usually do not require medical attention (report to your doctor or health care professional if they continue or are  bothersome): °-chills °-cough °-dizziness °-fever °-headaches °-joint pain °-muscle cramps °-muscle pain °-nausea, vomiting °-pain, redness, or irritation at site where injected °This list may not describe all possible side effects. Call your doctor for medical advice about side effects. You may report side effects to FDA at 1-800-FDA-1088. °Where should I keep my medicine? °Keep out of the reach of children. °Store in a refrigerator between 2 and 8 degrees C (36 and 46 degrees F). Do not freeze or shake. Throw away any unused portion if using a single-dose vial. Multi-dose vials can be kept in the refrigerator for up to 21 days after the initial dose. Throw away unused medicine. °NOTE: This sheet is a summary. It may not cover all possible information. If you have questions about this medicine, talk to your doctor, pharmacist, or health care provider. °© 2019 Elsevier/Gold Standard (2017-01-20 08:35:19) °Ferumoxytol injection °What is this medicine? °FERUMOXYTOL is an iron complex. Iron is used to make healthy red blood cells, which carry oxygen and nutrients throughout the body. This medicine is used to treat iron deficiency anemia. °This medicine may be used for other purposes; ask your health care provider or pharmacist if you have questions. °COMMON BRAND NAME(S): Feraheme °What should I tell my health care provider before I take this medicine? °They need to know if you have any of these conditions: °-anemia not caused by low iron levels °-high levels of iron in the blood °-magnetic resonance imaging (MRI) test scheduled °-an unusual or allergic reaction to iron, other medicines, foods, dyes, or preservatives °-pregnant or trying to get pregnant °-breast-feeding °How should I use this medicine? °This medicine is for injection into a vein. It is given by a health care professional in a hospital or clinic setting. °Talk to your pediatrician regarding the use of this medicine in children. Special care may be  needed. °Overdosage: If you think you have taken too much of this medicine contact a poison control center or emergency room at once. °NOTE: This medicine is only for you. Do not share this medicine with others. °What if I miss a dose? °It is important not to miss your dose. Call your doctor or health care professional if you are unable to keep an appointment. °What may interact with this medicine? °This medicine may interact with the following medications: °-other iron products °This list may not describe all possible interactions. Give your health care provider a list of all the medicines, herbs, non-prescription drugs, or dietary supplements you use. Also tell them if you smoke, drink alcohol, or use illegal drugs. Some items may interact with your medicine. °What should I watch for while using this medicine? °Visit your doctor or healthcare professional regularly. Tell your doctor or healthcare professional if your symptoms do not start to get better or if they get worse. You may need blood work done while you are taking this medicine. °You may need to follow a special diet. Talk to your doctor. Foods that contain iron include: whole grains/cereals, dried fruits, beans, or peas, leafy green vegetables, and organ meats (liver, kidney). °What side effects may I notice from receiving this medicine? °Side effects that you should report to your doctor or health care   professional as soon as possible: °-allergic reactions like skin rash, itching or hives, swelling of the face, lips, or tongue °-breathing problems °-changes in blood pressure °-feeling faint or lightheaded, falls °-fever or chills °-flushing, sweating, or hot feelings °-swelling of the ankles or feet °Side effects that usually do not require medical attention (report to your doctor or health care professional if they continue or are bothersome): °-diarrhea °-headache °-nausea, vomiting °-stomach pain °This list may not describe all possible side effects.  Call your doctor for medical advice about side effects. You may report side effects to FDA at 1-800-FDA-1088. °Where should I keep my medicine? °This drug is given in a hospital or clinic and will not be stored at home. °NOTE: This sheet is a summary. It may not cover all possible information. If you have questions about this medicine, talk to your doctor, pharmacist, or health care provider. °© 2019 Elsevier/Gold Standard (2016-08-01 20:21:10) ° °

## 2018-08-30 NOTE — Assessment & Plan Note (Signed)
L foot ulcer does not appear infected and patient is afebrile, no systemic symptoms. He has decreased sensation and weak pulses so ABIs were checked today.

## 2018-08-30 NOTE — Assessment & Plan Note (Signed)
POC ABI abnormal in L LE today with a noninfected diabetic foot ulcer. No critical limb ischemia as good skin turgor and no pallor. Referred to [facility] heartcare.

## 2018-08-30 NOTE — Addendum Note (Signed)
Addended by: Salvatore Marvel on: 08/30/2018 04:19 PM   Modules accepted: Orders

## 2018-08-31 ENCOUNTER — Other Ambulatory Visit: Payer: Self-pay

## 2018-08-31 ENCOUNTER — Ambulatory Visit (HOSPITAL_COMMUNITY)
Admission: RE | Admit: 2018-08-31 | Discharge: 2018-08-31 | Disposition: A | Discharge status: Home / Self Care | Payer: Medicaid Other | Source: Ambulatory Visit | Attending: Internal Medicine | Admitting: Internal Medicine

## 2018-08-31 ENCOUNTER — Telehealth: Payer: Self-pay | Admitting: *Deleted

## 2018-08-31 VITALS — BP 151/59 | HR 88 | Temp 98.6°F | Resp 20 | Ht 71.0 in | Wt 170.0 lb

## 2018-08-31 DIAGNOSIS — N184 Chronic kidney disease, stage 4 (severe): Secondary | ICD-10-CM | POA: Diagnosis present

## 2018-08-31 LAB — POCT HEMOGLOBIN-HEMACUE: Hemoglobin: 7.1 g/dL — ABNORMAL LOW (ref 13.0–17.0)

## 2018-08-31 MED ORDER — SODIUM CHLORIDE 0.9 % IV SOLN
510.0000 mg | INTRAVENOUS | Status: DC
  Administered 2018-08-31: 510 mg via INTRAVENOUS
  Filled 2018-08-31: qty 510

## 2018-08-31 MED ORDER — EPOETIN ALFA-EPBX 10000 UNIT/ML IJ SOLN
20000.0000 [IU] | Freq: Once | INTRAMUSCULAR | Status: AC
  Administered 2018-08-31: 20000 [IU] via SUBCUTANEOUS
  Filled 2018-08-31: qty 2

## 2018-08-31 MED ORDER — EPOETIN ALFA-EPBX 10000 UNIT/ML IJ SOLN
10000.0000 [IU] | INTRAMUSCULAR | Status: DC
  Filled 2018-08-31: qty 1

## 2018-08-31 NOTE — Telephone Encounter (Signed)
LM for patient with information from provider. Jazmin Hartsell,CMA

## 2018-08-31 NOTE — Telephone Encounter (Signed)
-----   Message from Bufford Lope, DO sent at 08/30/2018  4:33 PM EST ----- Referred to [facility] heartcare for borderline ABI in the setting of diabetic foot ulcer. Can patient please be informed of referral

## 2018-09-07 ENCOUNTER — Other Ambulatory Visit: Payer: Self-pay

## 2018-09-07 ENCOUNTER — Ambulatory Visit (HOSPITAL_COMMUNITY)
Admission: RE | Admit: 2018-09-07 | Discharge: 2018-09-07 | Disposition: A | Discharge status: Home / Self Care | Payer: Medicaid Other | Source: Ambulatory Visit | Attending: Internal Medicine | Admitting: Internal Medicine

## 2018-09-07 DIAGNOSIS — D631 Anemia in chronic kidney disease: Secondary | ICD-10-CM | POA: Insufficient documentation

## 2018-09-07 DIAGNOSIS — N185 Chronic kidney disease, stage 5: Secondary | ICD-10-CM | POA: Diagnosis not present

## 2018-09-07 MED ORDER — SODIUM CHLORIDE 0.9 % IV SOLN
510.0000 mg | INTRAVENOUS | Status: AC
  Administered 2018-09-07: 510 mg via INTRAVENOUS
  Filled 2018-09-07: qty 17

## 2018-09-12 ENCOUNTER — Telehealth: Payer: Self-pay | Admitting: Family Medicine

## 2018-09-12 NOTE — Telephone Encounter (Signed)
Called patient to offer telephone visit alternative to office visit tomorrow. Had planned a diabetes follow up given his last a1c was uncontrolled in the setting of lack of insulin. We had restarted his insulin after he got insurance and have been titrating.  He states he has been taking lantus 20U every morning. But stopped taking humalog because he felt that his sugars were adequately controlled. Fasting mornings have been averaging 130s. Did have few lows to 60s and 80s but only 2 in the last few weeks. Highest 190s. Postprandials averaging 250s. Lowest post prandials have been low 200s. Discussed trial of humalog 5U with his biggest meal of the day.  He states that L foot ulcer is healing well. No fevers or drainage. Plan to discuss in 2 weeks, patient will call me with his sugars if COVID concerns are still prominent. Otherwise he will make appointment for follow up. Discussed good hand hygiene and social distancing.

## 2018-09-13 ENCOUNTER — Ambulatory Visit: Payer: Medicaid Other | Admitting: Family Medicine

## 2018-09-17 ENCOUNTER — Other Ambulatory Visit: Payer: Self-pay | Admitting: Hematology

## 2018-09-17 DIAGNOSIS — D509 Iron deficiency anemia, unspecified: Secondary | ICD-10-CM | POA: Insufficient documentation

## 2018-09-20 ENCOUNTER — Inpatient Hospital Stay: Payer: Medicaid Other

## 2018-09-20 ENCOUNTER — Inpatient Hospital Stay: Payer: Medicaid Other | Admitting: Hematology

## 2018-09-27 ENCOUNTER — Other Ambulatory Visit: Payer: Self-pay

## 2018-09-28 ENCOUNTER — Ambulatory Visit (HOSPITAL_COMMUNITY)
Admission: RE | Admit: 2018-09-28 | Discharge: 2018-09-28 | Disposition: A | Discharge status: Home / Self Care | Payer: Medicaid Other | Source: Ambulatory Visit | Attending: Internal Medicine | Admitting: Internal Medicine

## 2018-09-28 VITALS — BP 180/103 | HR 76 | Temp 97.8°F | Resp 20

## 2018-09-28 DIAGNOSIS — N184 Chronic kidney disease, stage 4 (severe): Secondary | ICD-10-CM | POA: Diagnosis present

## 2018-09-28 LAB — IRON AND TIBC
Iron: 53 ug/dL (ref 45–182)
Saturation Ratios: 26 % (ref 17.9–39.5)
TIBC: 204 ug/dL — ABNORMAL LOW (ref 250–450)
UIBC: 151 ug/dL

## 2018-09-28 LAB — POCT HEMOGLOBIN-HEMACUE: Hemoglobin: 8.6 g/dL — ABNORMAL LOW (ref 13.0–17.0)

## 2018-09-28 LAB — FERRITIN: Ferritin: 465 ng/mL — ABNORMAL HIGH (ref 24–336)

## 2018-09-28 MED ORDER — EPOETIN ALFA-EPBX 10000 UNIT/ML IJ SOLN
20000.0000 [IU] | INTRAMUSCULAR | Status: DC
  Administered 2018-09-28: 20000 [IU] via SUBCUTANEOUS
  Filled 2018-09-28: qty 2

## 2018-10-25 ENCOUNTER — Other Ambulatory Visit: Payer: Self-pay

## 2018-10-26 ENCOUNTER — Encounter (HOSPITAL_COMMUNITY)
Admission: RE | Admit: 2018-10-26 | Discharge: 2018-10-26 | Disposition: A | Discharge status: Home / Self Care | Payer: Medicaid Other | Source: Ambulatory Visit | Attending: Internal Medicine | Admitting: Internal Medicine

## 2018-10-26 VITALS — BP 178/92 | HR 73 | Temp 97.2°F | Resp 18

## 2018-10-26 DIAGNOSIS — N184 Chronic kidney disease, stage 4 (severe): Secondary | ICD-10-CM | POA: Diagnosis present

## 2018-10-26 LAB — IRON AND TIBC
Iron: 47 ug/dL (ref 45–182)
Saturation Ratios: 25 % (ref 17.9–39.5)
TIBC: 188 ug/dL — ABNORMAL LOW (ref 250–450)
UIBC: 141 ug/dL

## 2018-10-26 LAB — POCT HEMOGLOBIN-HEMACUE: Hemoglobin: 8.7 g/dL — ABNORMAL LOW (ref 13.0–17.0)

## 2018-10-26 LAB — RENAL FUNCTION PANEL
Albumin: 2.3 g/dL — ABNORMAL LOW (ref 3.5–5.0)
Anion gap: 11 (ref 5–15)
BUN: 63 mg/dL — ABNORMAL HIGH (ref 6–20)
CO2: 17 mmol/L — ABNORMAL LOW (ref 22–32)
Calcium: 6.2 mg/dL — CL (ref 8.9–10.3)
Chloride: 110 mmol/L (ref 98–111)
Creatinine, Ser: 7.82 mg/dL — ABNORMAL HIGH (ref 0.61–1.24)
GFR calc Af Amer: 8 mL/min — ABNORMAL LOW (ref >60)
GFR calc non Af Amer: 7 mL/min — ABNORMAL LOW (ref >60)
Glucose, Bld: 338 mg/dL — ABNORMAL HIGH (ref 70–99)
Phosphorus: 5.9 mg/dL — ABNORMAL HIGH (ref 2.5–4.6)
Potassium: 5.1 mmol/L (ref 3.5–5.1)
Sodium: 138 mmol/L (ref 135–145)

## 2018-10-26 LAB — FERRITIN: Ferritin: 319 ng/mL (ref 24–336)

## 2018-10-26 MED ORDER — EPOETIN ALFA-EPBX 10000 UNIT/ML IJ SOLN
30000.0000 [IU] | Freq: Once | INTRAMUSCULAR | Status: AC
  Administered 2018-10-26: 11:00:00 30000 [IU] via SUBCUTANEOUS
  Filled 2018-10-26: qty 3

## 2018-10-26 MED ORDER — EPOETIN ALFA-EPBX 40000 UNIT/ML IJ SOLN: 30000.0000 [IU] | INTRAMUSCULAR | Status: DC

## 2018-10-27 LAB — PTH, INTACT AND CALCIUM
Calcium, Total (PTH): 6 mg/dL — CL (ref 8.7–10.2)
PTH: 156 pg/mL — ABNORMAL HIGH (ref 15–65)

## 2018-11-23 ENCOUNTER — Encounter (HOSPITAL_COMMUNITY)
Admission: RE | Admit: 2018-11-23 | Discharge: 2018-11-23 | Disposition: A | Discharge status: Home / Self Care | Payer: Medicaid Other | Source: Ambulatory Visit | Attending: Internal Medicine | Admitting: Internal Medicine

## 2018-11-23 ENCOUNTER — Other Ambulatory Visit: Payer: Self-pay

## 2018-11-23 VITALS — BP 164/91 | HR 72 | Temp 98.7°F | Resp 18

## 2018-11-23 DIAGNOSIS — N184 Chronic kidney disease, stage 4 (severe): Secondary | ICD-10-CM | POA: Diagnosis not present

## 2018-11-23 LAB — IRON AND TIBC
Iron: 47 ug/dL (ref 45–182)
Saturation Ratios: 25 % (ref 17.9–39.5)
TIBC: 192 ug/dL — ABNORMAL LOW (ref 250–450)
UIBC: 145 ug/dL

## 2018-11-23 LAB — FERRITIN: Ferritin: 243 ng/mL (ref 24–336)

## 2018-11-23 LAB — POCT HEMOGLOBIN-HEMACUE: Hemoglobin: 9.1 g/dL — ABNORMAL LOW (ref 13.0–17.0)

## 2018-11-23 MED ORDER — EPOETIN ALFA-EPBX 10000 UNIT/ML IJ SOLN
40000.0000 [IU] | Freq: Once | INTRAMUSCULAR | Status: AC
  Administered 2018-11-23: 40000 [IU] via SUBCUTANEOUS
  Filled 2018-11-23: qty 4

## 2018-11-23 MED ORDER — EPOETIN ALFA-EPBX 10000 UNIT/ML IJ SOLN
30000.0000 [IU] | Freq: Once | INTRAMUSCULAR | Status: DC
  Filled 2018-11-23: qty 3

## 2018-11-23 MED ORDER — EPOETIN ALFA-EPBX 40000 UNIT/ML IJ SOLN: 30000.0000 [IU] | INTRAMUSCULAR | Status: DC

## 2018-11-28 ENCOUNTER — Other Ambulatory Visit: Payer: Self-pay | Admitting: Family Medicine

## 2018-11-28 ENCOUNTER — Ambulatory Visit: Payer: Medicaid Other | Admitting: Family Medicine

## 2018-11-28 DIAGNOSIS — E1165 Type 2 diabetes mellitus with hyperglycemia: Secondary | ICD-10-CM

## 2018-11-29 NOTE — Telephone Encounter (Signed)
Please schedule patient for appointment. In office preferable but telemedicine also appropriate.

## 2018-12-04 ENCOUNTER — Encounter: Payer: Self-pay | Admitting: Family Medicine

## 2018-12-04 ENCOUNTER — Other Ambulatory Visit: Payer: Self-pay

## 2018-12-04 ENCOUNTER — Ambulatory Visit (INDEPENDENT_AMBULATORY_CARE_PROVIDER_SITE_OTHER): Payer: Medicaid Other | Admitting: Family Medicine

## 2018-12-04 VITALS — BP 160/92 | HR 76 | Ht 71.0 in | Wt 173.1 lb

## 2018-12-04 DIAGNOSIS — I10 Essential (primary) hypertension: Secondary | ICD-10-CM | POA: Diagnosis not present

## 2018-12-04 DIAGNOSIS — E1165 Type 2 diabetes mellitus with hyperglycemia: Secondary | ICD-10-CM | POA: Diagnosis present

## 2018-12-04 LAB — POCT GLYCOSYLATED HEMOGLOBIN (HGB A1C): HbA1c, POC (controlled diabetic range): 9 % — AB (ref 0.0–7.0)

## 2018-12-04 MED ORDER — ACCU-CHEK MULTICLIX LANCET DEV KIT: 1.0000 | PACK | Freq: Four times a day (QID) | 3 refills | Status: DC | PRN

## 2018-12-04 MED ORDER — INSULIN LISPRO (1 UNIT DIAL) 100 UNIT/ML (KWIKPEN): 5.0000 [IU] | PEN_INJECTOR | Freq: Three times a day (TID) | SUBCUTANEOUS | 11 refills | Status: DC

## 2018-12-04 MED ORDER — ACCU-CHEK MULTICLIX LANCETS MISC: 12 refills | Status: DC

## 2018-12-04 MED ORDER — PEN NEEDLES 32G X 4 MM MISC: 100.0000 "pen " | Freq: Four times a day (QID) | 2 refills | Status: DC

## 2018-12-04 MED ORDER — GLUCOSE BLOOD VI STRP: ORAL_STRIP | 12 refills | Status: DC

## 2018-12-04 MED ORDER — ALCOHOL PADS 70 % PADS: 1.0000 | MEDICATED_PAD | Freq: Four times a day (QID) | 3 refills | Status: AC | PRN

## 2018-12-04 MED ORDER — INSULIN GLARGINE 100 UNIT/ML SOLOSTAR PEN: 10.0000 [IU] | PEN_INJECTOR | Freq: Every day | SUBCUTANEOUS | 0 refills | Status: DC

## 2018-12-04 NOTE — Progress Notes (Signed)
a1c

## 2018-12-04 NOTE — Progress Notes (Signed)
Subjective:  Jeremiah [name] is a 53 y.o. male who presents to the [facility] today with a chief complaint of diabetes follow-up.   HPI:  Patient states that he has been only taking his Lantus 20 units every other day.  He has not been taking his Humalog at all.  He states that this is because he is afraid of hypoglycemic events.  In the last month he has had 3 low readings into the 50s.  These generally happen in the early hours of the morning around 4 AM, they wake him up from sleep.   His morning fasting readings have been averaging in the 130s. His postprandials have been around 250. He has no polyuria, polydipsia.  He states that he feels much improved and has more energy lately.  Patient is on amlodipine 10 mg daily and carvedilol 12.5 mg twice daily.  He did not take his blood pressure medicine today.  He does not have any chest pain, shortness of breath, lower extremity edema, lightheadedness, dizziness.   ROS: Per HPI  Social Hx: He reports that he has never smoked. He has never used smokeless tobacco. He reports current drug use. Drug: Marijuana. He reports that he does not drink alcohol.  Objective:  Physical Exam: BP (!) 160/92   Pulse 76   Ht 5' 11" (1.803 m)   Wt 173 lb 2 oz (78.5 kg)   SpO2 99%   BMI 24.15 kg/m   Gen: NAD, resting comfortably Pulm: NWOB Skin: warm, dry Neuro: grossly normal, moves all extremities Psych: Normal affect and thought content  Results for orders placed or performed in visit on 12/04/18 (from the past 72 hour(s))  HgB A1c     Status: Abnormal   Collection Time: 12/04/18 10:38 AM  Result Value Ref Range   Hemoglobin A1C     HbA1c POC (<> result, manual entry)     HbA1c, POC (prediabetic range)     HbA1c, POC (controlled diabetic range) 9.0 (A) 0.0 - 7.0 %     Assessment/Plan:  Poorly controlled type 2 diabetes mellitus Uncontrolled.  He is been having hypoglycemic events.  Discussed decreasing Lantus to 10 units daily and starting  Humalog 5 units with meals.  Counseled patient on importance of insulin compliance as well as the need for regular glucose monitoring at home for insulin titration.  Patient voiced good understanding and high motivation.  He will have a 2-week telemedicine follow-up with his home glucose readings.  Hypertension Uncontrolled.  Will likely need an additional antihypertensive medication however since he did not his medications this morning we will address at a future visit when he has been more compliant on his medications.    Orders Placed This Encounter  Procedures  . HgB A1c    Meds ordered this encounter  Medications  . Insulin Glargine (LANTUS SOLOSTAR) 100 UNIT/ML Solostar Pen    Sig: Inject 10 Units into the skin daily.    Dispense:  12 mL    Refill:  0  . insulin lispro (HUMALOG KWIKPEN) 100 UNIT/ML KwikPen    Sig: Inject 0.05 mLs (5 Units total) into the skin 3 (three) times daily. Increase as instructed. Max daily dose 30 units    Dispense:  12 mL    Refill:  11  . Insulin Pen Needle (PEN NEEDLES) 32G X 4 MM MISC    Sig: 100 pens by Does not apply route 4 (four) times daily. Use to inject insulin four times daily  Dispense:  100 each    Refill:  2  . glucose blood (ACCU-CHEK AVIVA PLUS) test strip    Sig: Use as instructed    Dispense:  100 each    Refill:  12  . Lancets (ACCU-CHEK MULTICLIX) lancets    Sig: Use as instructed    Dispense:  100 each    Refill:  12  . Lancets Misc. (ACCU-CHEK MULTICLIX LANCET DEV) KIT    Sig: 1 each by Does not apply route 4 (four) times daily as needed.    Dispense:  1 each    Refill:  3  . Alcohol Swabs (ALCOHOL PADS) 70 % PADS    Sig: 1 each by Does not apply route 4 (four) times daily as needed.    Dispense:  1 each    Refill:  [place], DO PGY-3, [facility] 12/04/2018 10:51 AM

## 2018-12-04 NOTE — Patient Instructions (Signed)
Lantus 10 units daily and  humalog 5U with meals.  Please check your sugars 4 times a day. Your morning fasting reading and 2 hrs after eating for the next 2 weeks. Based on these readings we will adjust your insulin regimen.  Make a telemedicine visit with me in 2 weeks.

## 2018-12-04 NOTE — Assessment & Plan Note (Signed)
Uncontrolled.  Will likely need an additional antihypertensive medication however since he did not his medications this morning we will address at a future visit when he has been more compliant on his medications.

## 2018-12-04 NOTE — Assessment & Plan Note (Signed)
Uncontrolled.  He is been having hypoglycemic events.  Discussed decreasing Lantus to 10 units daily and starting Humalog 5 units with meals.  Counseled patient on importance of insulin compliance as well as the need for regular glucose monitoring at home for insulin titration.  Patient voiced good understanding and high motivation.  He will have a 2-week telemedicine follow-up with his home glucose readings.

## 2018-12-11 ENCOUNTER — Telehealth: Payer: Self-pay | Admitting: Family Medicine

## 2018-12-11 NOTE — Telephone Encounter (Signed)
LM for patient to call back.  Please assist him in making a telephone visit next week to discuss home glucose readings/ dental procedure.  Jazmin Hartsell,CMA

## 2018-12-11 NOTE — Telephone Encounter (Signed)
Received fax from [facility] for preop clearance for teeth extraction. Can patient please be called and scheduled for appointment to make sure sugars are optimized for this procedure?

## 2018-12-20 ENCOUNTER — Other Ambulatory Visit (HOSPITAL_COMMUNITY): Payer: Self-pay | Admitting: *Deleted

## 2018-12-21 ENCOUNTER — Other Ambulatory Visit: Payer: Self-pay

## 2018-12-21 ENCOUNTER — Encounter (HOSPITAL_COMMUNITY)
Admission: RE | Admit: 2018-12-21 | Discharge: 2018-12-21 | Disposition: A | Discharge status: Home / Self Care | Payer: Medicaid Other | Source: Ambulatory Visit | Attending: Internal Medicine | Admitting: Internal Medicine

## 2018-12-21 VITALS — BP 136/85 | HR 85 | Temp 98.5°F | Resp 18

## 2018-12-21 DIAGNOSIS — N184 Chronic kidney disease, stage 4 (severe): Secondary | ICD-10-CM | POA: Diagnosis present

## 2018-12-21 LAB — POCT HEMOGLOBIN-HEMACUE: Hemoglobin: 9.8 g/dL — ABNORMAL LOW (ref 13.0–17.0)

## 2018-12-21 LAB — IRON AND TIBC
Iron: 45 ug/dL (ref 45–182)
Saturation Ratios: 23 % (ref 17.9–39.5)
TIBC: 196 ug/dL — ABNORMAL LOW (ref 250–450)
UIBC: 151 ug/dL

## 2018-12-21 LAB — FERRITIN: Ferritin: 264 ng/mL (ref 24–336)

## 2018-12-21 MED ORDER — EPOETIN ALFA-EPBX 40000 UNIT/ML IJ SOLN
40000.0000 [IU] | INTRAMUSCULAR | Status: DC
  Administered 2018-12-21: 11:00:00 40000 [IU] via SUBCUTANEOUS
  Filled 2018-12-21: qty 1

## 2019-01-18 ENCOUNTER — Ambulatory Visit (HOSPITAL_COMMUNITY)
Admission: RE | Admit: 2019-01-18 | Discharge: 2019-01-18 | Disposition: A | Discharge status: Home / Self Care | Payer: Medicaid Other | Source: Ambulatory Visit | Attending: Internal Medicine | Admitting: Internal Medicine

## 2019-01-18 ENCOUNTER — Other Ambulatory Visit: Payer: Self-pay

## 2019-01-18 VITALS — BP 176/87 | HR 71 | Temp 95.9°F | Resp 18

## 2019-01-18 DIAGNOSIS — N184 Chronic kidney disease, stage 4 (severe): Secondary | ICD-10-CM | POA: Insufficient documentation

## 2019-01-18 LAB — IRON AND TIBC
Iron: 62 ug/dL (ref 45–182)
Saturation Ratios: 30 % (ref 17.9–39.5)
TIBC: 209 ug/dL — ABNORMAL LOW (ref 250–450)
UIBC: 147 ug/dL

## 2019-01-18 LAB — FERRITIN: Ferritin: 228 ng/mL (ref 24–336)

## 2019-01-18 LAB — POCT HEMOGLOBIN-HEMACUE: Hemoglobin: 9.8 g/dL — ABNORMAL LOW (ref 13.0–17.0)

## 2019-01-18 MED ORDER — EPOETIN ALFA-EPBX 40000 UNIT/ML IJ SOLN
40000.0000 [IU] | INTRAMUSCULAR | Status: DC
  Administered 2019-01-18: 40000 [IU] via SUBCUTANEOUS
  Filled 2019-01-18: qty 1

## 2019-02-05 ENCOUNTER — Other Ambulatory Visit: Payer: Self-pay

## 2019-02-05 DIAGNOSIS — E1165 Type 2 diabetes mellitus with hyperglycemia: Secondary | ICD-10-CM

## 2019-02-05 MED ORDER — LANTUS SOLOSTAR 100 UNIT/ML ~~LOC~~ SOPN: 10.0000 [IU] | PEN_INJECTOR | Freq: Every day | SUBCUTANEOUS | 0 refills | Status: DC

## 2019-02-05 NOTE — Addendum Note (Signed)
Addended by: Londell Moh T on: 02/05/2019 03:08 PM   Modules accepted: Orders

## 2019-02-05 NOTE — Telephone Encounter (Signed)
Pharmacy called asking to change Lantus Rx. Rx written for 12 ML. Vials come in 15 ml. Pharmacy asking forverbal order to dispense 15 ML instead of 12 ml. Verbal order given. Ottis Stain, CMA                                           `

## 2019-02-15 ENCOUNTER — Ambulatory Visit (HOSPITAL_COMMUNITY)
Admission: RE | Admit: 2019-02-15 | Discharge: 2019-02-15 | Disposition: A | Discharge status: Home / Self Care | Payer: Medicaid Other | Source: Ambulatory Visit | Attending: Internal Medicine | Admitting: Internal Medicine

## 2019-02-15 ENCOUNTER — Other Ambulatory Visit: Payer: Self-pay

## 2019-02-15 VITALS — BP 165/100 | HR 80 | Temp 97.0°F | Resp 18

## 2019-02-15 DIAGNOSIS — N184 Chronic kidney disease, stage 4 (severe): Secondary | ICD-10-CM | POA: Insufficient documentation

## 2019-02-15 LAB — IRON AND TIBC
Iron: 69 ug/dL (ref 45–182)
Saturation Ratios: 36 % (ref 17.9–39.5)
TIBC: 193 ug/dL — ABNORMAL LOW (ref 250–450)
UIBC: 124 ug/dL

## 2019-02-15 LAB — FERRITIN: Ferritin: 289 ng/mL (ref 24–336)

## 2019-02-15 LAB — POCT HEMOGLOBIN-HEMACUE: Hemoglobin: 9.4 g/dL — ABNORMAL LOW (ref 13.0–17.0)

## 2019-02-15 MED ORDER — EPOETIN ALFA-EPBX 10000 UNIT/ML IJ SOLN
40000.0000 [IU] | INTRAMUSCULAR | Status: DC
  Administered 2019-02-15: 11:00:00 40000 [IU] via SUBCUTANEOUS
  Filled 2019-02-15: qty 4

## 2019-03-14 ENCOUNTER — Other Ambulatory Visit (HOSPITAL_COMMUNITY): Payer: Self-pay | Admitting: *Deleted

## 2019-03-15 ENCOUNTER — Other Ambulatory Visit: Payer: Self-pay

## 2019-03-15 ENCOUNTER — Encounter (HOSPITAL_COMMUNITY)
Admission: RE | Admit: 2019-03-15 | Discharge: 2019-03-15 | Disposition: A | Discharge status: Home / Self Care | Payer: Medicaid Other | Source: Ambulatory Visit | Attending: Internal Medicine | Admitting: Internal Medicine

## 2019-03-15 VITALS — BP 155/84 | HR 72 | Temp 97.3°F | Resp 18

## 2019-03-15 DIAGNOSIS — N184 Chronic kidney disease, stage 4 (severe): Secondary | ICD-10-CM | POA: Diagnosis present

## 2019-03-15 LAB — IRON AND TIBC
Iron: 65 ug/dL (ref 45–182)
Saturation Ratios: 37 % (ref 17.9–39.5)
TIBC: 174 ug/dL — ABNORMAL LOW (ref 250–450)
UIBC: 109 ug/dL

## 2019-03-15 LAB — FERRITIN: Ferritin: 310 ng/mL (ref 24–336)

## 2019-03-15 LAB — POCT HEMOGLOBIN-HEMACUE: Hemoglobin: 9.4 g/dL — ABNORMAL LOW (ref 13.0–17.0)

## 2019-03-15 MED ORDER — EPOETIN ALFA-EPBX 40000 UNIT/ML IJ SOLN
40000.0000 [IU] | INTRAMUSCULAR | Status: DC
  Administered 2019-03-15: 40000 [IU] via SUBCUTANEOUS
  Filled 2019-03-15: qty 1

## 2019-03-19 ENCOUNTER — Other Ambulatory Visit: Payer: Self-pay

## 2019-03-19 DIAGNOSIS — N184 Chronic kidney disease, stage 4 (severe): Secondary | ICD-10-CM

## 2019-03-21 ENCOUNTER — Encounter: Payer: Self-pay | Admitting: Family

## 2019-03-21 ENCOUNTER — Ambulatory Visit (HOSPITAL_COMMUNITY): Admission: RE | Admit: 2019-03-21 | Payer: Medicaid Other | Source: Ambulatory Visit

## 2019-03-21 ENCOUNTER — Ambulatory Visit (HOSPITAL_COMMUNITY): Payer: Medicaid Other | Attending: Vascular Surgery

## 2019-03-21 ENCOUNTER — Encounter: Payer: Medicaid Other | Admitting: Vascular Surgery

## 2019-04-05 ENCOUNTER — Other Ambulatory Visit: Payer: Self-pay

## 2019-04-05 ENCOUNTER — Ambulatory Visit (HOSPITAL_COMMUNITY)
Admission: RE | Admit: 2019-04-05 | Discharge: 2019-04-05 | Disposition: A | Discharge status: Home / Self Care | Payer: Medicaid Other | Source: Ambulatory Visit | Attending: Internal Medicine | Admitting: Internal Medicine

## 2019-04-05 VITALS — BP 145/76 | HR 73 | Temp 97.8°F | Resp 18

## 2019-04-05 DIAGNOSIS — N184 Chronic kidney disease, stage 4 (severe): Secondary | ICD-10-CM | POA: Insufficient documentation

## 2019-04-05 LAB — IRON AND TIBC
Iron: 47 ug/dL (ref 45–182)
Saturation Ratios: 28 % (ref 17.9–39.5)
TIBC: 165 ug/dL — ABNORMAL LOW (ref 250–450)
UIBC: 118 ug/dL

## 2019-04-05 LAB — FERRITIN: Ferritin: 253 ng/mL (ref 24–336)

## 2019-04-05 LAB — POCT HEMOGLOBIN-HEMACUE: Hemoglobin: 8.8 g/dL — ABNORMAL LOW (ref 13.0–17.0)

## 2019-04-05 MED ORDER — EPOETIN ALFA-EPBX 40000 UNIT/ML IJ SOLN
30000.0000 [IU] | INTRAMUSCULAR | Status: DC
  Administered 2019-04-05: 30000 [IU] via SUBCUTANEOUS
  Filled 2019-04-05: qty 1

## 2019-04-08 MED FILL — Epoetin Alfa-epbx Inj 10000 Unit/ML: INTRAMUSCULAR | Qty: 3 | Status: AC

## 2019-04-19 ENCOUNTER — Ambulatory Visit (HOSPITAL_COMMUNITY)
Admission: RE | Admit: 2019-04-19 | Discharge: 2019-04-19 | Disposition: A | Discharge status: Home / Self Care | Payer: Medicaid Other | Source: Ambulatory Visit | Attending: Internal Medicine | Admitting: Internal Medicine

## 2019-04-19 ENCOUNTER — Other Ambulatory Visit: Payer: Self-pay

## 2019-04-19 VITALS — BP 155/78 | HR 78 | Temp 97.1°F | Resp 18

## 2019-04-19 DIAGNOSIS — N184 Chronic kidney disease, stage 4 (severe): Secondary | ICD-10-CM | POA: Insufficient documentation

## 2019-04-19 LAB — POCT HEMOGLOBIN-HEMACUE: Hemoglobin: 8.6 g/dL — ABNORMAL LOW (ref 13.0–17.0)

## 2019-04-19 MED ORDER — EPOETIN ALFA-EPBX 40000 UNIT/ML IJ SOLN
30000.0000 [IU] | INTRAMUSCULAR | Status: DC
  Administered 2019-04-19: 30000 [IU] via SUBCUTANEOUS
  Filled 2019-04-19: qty 1

## 2019-04-28 ENCOUNTER — Other Ambulatory Visit: Payer: Self-pay | Admitting: Family Medicine

## 2019-04-28 DIAGNOSIS — E1165 Type 2 diabetes mellitus with hyperglycemia: Secondary | ICD-10-CM

## 2019-05-03 ENCOUNTER — Ambulatory Visit (HOSPITAL_COMMUNITY)
Admission: RE | Admit: 2019-05-03 | Discharge: 2019-05-03 | Disposition: A | Discharge status: Home / Self Care | Payer: Medicaid Other | Source: Ambulatory Visit | Attending: Internal Medicine | Admitting: Internal Medicine

## 2019-05-03 ENCOUNTER — Other Ambulatory Visit: Payer: Self-pay

## 2019-05-03 VITALS — BP 138/78 | HR 73 | Temp 98.0°F | Resp 18

## 2019-05-03 DIAGNOSIS — N184 Chronic kidney disease, stage 4 (severe): Secondary | ICD-10-CM | POA: Insufficient documentation

## 2019-05-03 LAB — IRON AND TIBC
Iron: 69 ug/dL (ref 45–182)
Saturation Ratios: 38 % (ref 17.9–39.5)
TIBC: 181 ug/dL — ABNORMAL LOW (ref 250–450)
UIBC: 112 ug/dL

## 2019-05-03 LAB — POCT HEMOGLOBIN-HEMACUE: Hemoglobin: 9.2 g/dL — ABNORMAL LOW (ref 13.0–17.0)

## 2019-05-03 LAB — FERRITIN: Ferritin: 236 ng/mL (ref 24–336)

## 2019-05-03 MED ORDER — EPOETIN ALFA-EPBX 10000 UNIT/ML IJ SOLN
30000.0000 [IU] | Freq: Once | INTRAMUSCULAR | Status: AC
  Administered 2019-05-03: 30000 [IU] via SUBCUTANEOUS
  Filled 2019-05-03: qty 3

## 2019-05-03 MED ORDER — EPOETIN ALFA-EPBX 40000 UNIT/ML IJ SOLN: 30000.0000 [IU] | INTRAMUSCULAR | Status: DC

## 2019-05-08 ENCOUNTER — Telehealth (HOSPITAL_COMMUNITY): Payer: Self-pay

## 2019-05-08 NOTE — Telephone Encounter (Signed)

## 2019-05-09 ENCOUNTER — Ambulatory Visit (INDEPENDENT_AMBULATORY_CARE_PROVIDER_SITE_OTHER)
Admission: RE | Admit: 2019-05-09 | Discharge: 2019-05-09 | Disposition: A | Discharge status: Home / Self Care | Payer: Medicaid Other | Source: Ambulatory Visit | Attending: Vascular Surgery | Admitting: Vascular Surgery

## 2019-05-09 ENCOUNTER — Encounter: Payer: Self-pay | Admitting: *Deleted

## 2019-05-09 ENCOUNTER — Other Ambulatory Visit: Payer: Self-pay | Admitting: *Deleted

## 2019-05-09 ENCOUNTER — Ambulatory Visit (INDEPENDENT_AMBULATORY_CARE_PROVIDER_SITE_OTHER): Payer: Medicaid Other | Admitting: Vascular Surgery

## 2019-05-09 ENCOUNTER — Other Ambulatory Visit: Payer: Self-pay

## 2019-05-09 ENCOUNTER — Ambulatory Visit (HOSPITAL_COMMUNITY)
Admission: RE | Admit: 2019-05-09 | Discharge: 2019-05-09 | Disposition: A | Discharge status: Home / Self Care | Payer: Medicaid Other | Source: Ambulatory Visit | Attending: Vascular Surgery | Admitting: Vascular Surgery

## 2019-05-09 ENCOUNTER — Encounter: Payer: Self-pay | Admitting: Vascular Surgery

## 2019-05-09 VITALS — BP 101/63 | HR 61 | Temp 97.8°F | Resp 20 | Ht 71.0 in | Wt 180.8 lb

## 2019-05-09 DIAGNOSIS — N184 Chronic kidney disease, stage 4 (severe): Secondary | ICD-10-CM
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
## 2019-05-10 ENCOUNTER — Other Ambulatory Visit: Payer: Self-pay

## 2019-05-10 ENCOUNTER — Encounter (HOSPITAL_COMMUNITY): Payer: Self-pay

## 2019-05-10 NOTE — Anesthesia Preprocedure Evaluation (Addendum)
Anesthesia Evaluation  Patient identified by MRN, date of birth, ID band Patient awake    Reviewed: Allergy & Precautions, NPO status   Airway Mallampati: II  TM Distance: >3 FB   Mouth opening: Limited Mouth Opening  Dental  (+) Teeth Intact, Dental Advisory Given   Pulmonary    breath sounds clear to auscultation       Cardiovascular hypertension, Pt. on medications  Rhythm:Regular Rate:Normal     Neuro/Psych    GI/Hepatic negative GI ROS, Neg liver ROS,   Endo/Other  diabetes, Poorly Controlled, Type 2  Renal/GU CRFRenal disease     Musculoskeletal   Abdominal   Peds  Hematology   Anesthesia Other Findings Patient with limited mouth opening. Patient states he has had jaw surgery in the past and cannot fully open his mouth.  Reproductive/Obstetrics                          Anesthesia Physical Anesthesia Plan  ASA: III  Anesthesia Plan: General   Post-op Pain Management:    Induction: Intravenous  PONV Risk Score and Plan: 2 and Propofol infusion, Ondansetron and Midazolam  Airway Management Planned: LMA  Additional Equipment:   Intra-op Plan:   Post-operative Plan: Extubation in OR  Informed Consent: I have reviewed the patients History and Physical, chart, labs and discussed the procedure including the risks, benefits and alternatives for the proposed anesthesia with the patient or authorized representative who has indicated his/her understanding and acceptance.     Dental advisory given  Plan Discussed with: Anesthesiologist and CRNA  Anesthesia Plan Comments: (ESRD not yet on HD. Echo 2019 with severe LV wall thickening, grade 1dd, EF 65-70%, normal valves. SDW will need DOS labs and eval.   TTE 01/20/18: - Left ventricle: The cavity size was normal. There was severe   concentric hypertrophy. Systolic function was vigorous. The   estimated ejection fraction was in the  range of 65% to 70%. Wall   motion was normal; there were no regional wall motion   abnormalities. Doppler parameters are consistent with abnormal   left ventricular relaxation (grade 1 diastolic dysfunction). The   E/e&' ratio is between 8-15, suggesting indeterminate LV filling   pressure. - Mitral valve: Mildly thickened leaflets . There was trivial   regurgitation. - Left atrium: The atrium was mildly dilated. - Right atrium: The atrium was normal in size. - Inferior vena cava: The vessel was normal in size. The   respirophasic diameter changes were in the normal range (>= 50%),   consistent with normal central venous pressure.  Impressions:  - Compared to a prior study in 2016, the LVEF is higher at 65-70%   wtih severe LV wall thicknening. GIven the appearance of the   myocardium, would consider possible work-up for infiltrative   cardioymopathy or consider HCM. )      Anesthesia Quick Evaluation

## 2019-05-10 NOTE — Progress Notes (Signed)
Anesthesia Chart Review: Same day workup  ESRD not yet on HD. Echo 2019 with severe LV wall thickening, grade 1dd, EF 65-70%, normal valves. SDW will need DOS labs and eval.   TTE 01/20/18: - Left ventricle: The cavity size was normal. There was severe   concentric hypertrophy. Systolic function was vigorous. The   estimated ejection fraction was in the range of 65% to 70%. Wall   motion was normal; there were no regional wall motion   abnormalities. Doppler parameters are consistent with abnormal   left ventricular relaxation (grade 1 diastolic dysfunction). The   E/e&' ratio is between 8-15, suggesting indeterminate LV filling   pressure. - Mitral valve: Mildly thickened leaflets . There was trivial   regurgitation. - Left atrium: The atrium was mildly dilated. - Right atrium: The atrium was normal in size. - Inferior vena cava: The vessel was normal in size. The   respirophasic diameter changes were in the normal range (>= 50%),   consistent with normal central venous pressure.  Impressions:  - Compared to a prior study in 2016, the LVEF is higher at 65-70%   wtih severe LV wall thicknening. GIven the appearance of the   myocardium, would consider possible work-up for infiltrative   cardioymopathy or consider HCM.    Wynonia Musty [facility]/Anesthesiology Phone [phone] 05/10/2019 1:17 PM

## 2019-05-11 ENCOUNTER — Other Ambulatory Visit (HOSPITAL_COMMUNITY)
Admission: RE | Admit: 2019-05-11 | Discharge: 2019-05-11 | Disposition: A | Discharge status: Home / Self Care | Payer: Medicaid Other | Source: Ambulatory Visit | Attending: Vascular Surgery | Admitting: Vascular Surgery

## 2019-05-11 DIAGNOSIS — Z01812 Encounter for preprocedural laboratory examination: Secondary | ICD-10-CM | POA: Diagnosis not present

## 2019-05-11 DIAGNOSIS — Z20828 Contact with and (suspected) exposure to other viral communicable diseases: Secondary | ICD-10-CM | POA: Insufficient documentation

## 2019-05-11 LAB — SARS CORONAVIRUS 2 (TAT 6-24 HRS): SARS Coronavirus 2: NEGATIVE

## 2019-05-13 ENCOUNTER — Encounter (HOSPITAL_COMMUNITY)
Admission: RE | Disposition: A | Discharge status: Home / Self Care | Payer: Self-pay | Source: Home / Self Care | Attending: Vascular Surgery

## 2019-05-13 ENCOUNTER — Encounter (HOSPITAL_COMMUNITY): Payer: Self-pay

## 2019-05-13 ENCOUNTER — Ambulatory Visit (HOSPITAL_COMMUNITY)
Admission: RE | Admit: 2019-05-13 | Discharge: 2019-05-13 | Disposition: A | Discharge status: Home / Self Care | Payer: Medicaid Other | Attending: Vascular Surgery | Admitting: Vascular Surgery

## 2019-05-13 ENCOUNTER — Other Ambulatory Visit: Payer: Self-pay

## 2019-05-13 ENCOUNTER — Ambulatory Visit (HOSPITAL_COMMUNITY): Payer: Medicaid Other | Admitting: Physician Assistant

## 2019-05-13 DIAGNOSIS — G8929 Other chronic pain: Secondary | ICD-10-CM | POA: Diagnosis not present

## 2019-05-13 DIAGNOSIS — I120 Hypertensive chronic kidney disease with stage 5 chronic kidney disease or end stage renal disease: Secondary | ICD-10-CM | POA: Diagnosis present

## 2019-05-13 DIAGNOSIS — E1122 Type 2 diabetes mellitus with diabetic chronic kidney disease: Secondary | ICD-10-CM | POA: Diagnosis not present

## 2019-05-13 DIAGNOSIS — E11319 Type 2 diabetes mellitus with unspecified diabetic retinopathy without macular edema: Secondary | ICD-10-CM | POA: Diagnosis not present

## 2019-05-13 DIAGNOSIS — Z7982 Long term (current) use of aspirin: Secondary | ICD-10-CM | POA: Diagnosis not present

## 2019-05-13 DIAGNOSIS — N186 End stage renal disease: Secondary | ICD-10-CM | POA: Insufficient documentation

## 2019-05-13 DIAGNOSIS — M1990 Unspecified osteoarthritis, unspecified site: Secondary | ICD-10-CM | POA: Insufficient documentation

## 2019-05-13 DIAGNOSIS — Z79899 Other long term (current) drug therapy: Secondary | ICD-10-CM | POA: Diagnosis not present

## 2019-05-13 DIAGNOSIS — E1140 Type 2 diabetes mellitus with diabetic neuropathy, unspecified: Secondary | ICD-10-CM | POA: Insufficient documentation

## 2019-05-13 DIAGNOSIS — Z794 Long term (current) use of insulin: Secondary | ICD-10-CM | POA: Insufficient documentation

## 2019-05-13 DIAGNOSIS — N185 Chronic kidney disease, stage 5: Secondary | ICD-10-CM

## 2019-05-13 HISTORY — DX: Chronic kidney disease, unspecified: N18.9

## 2019-05-13 HISTORY — PX: AV FISTULA PLACEMENT: SHX1204

## 2019-05-13 HISTORY — DX: Cardiac murmur, unspecified: R01.1

## 2019-05-13 LAB — POCT I-STAT, CHEM 8
BUN: 107 mg/dL — ABNORMAL HIGH (ref 6–20)
Calcium, Ion: 1.09 mmol/L — ABNORMAL LOW (ref 1.15–1.40)
Chloride: 103 mmol/L (ref 98–111)
Creatinine, Ser: 13.3 mg/dL — ABNORMAL HIGH (ref 0.61–1.24)
Glucose, Bld: 96 mg/dL (ref 70–99)
HCT: 33 % — ABNORMAL LOW (ref 39.0–52.0)
Hemoglobin: 11.2 g/dL — ABNORMAL LOW (ref 13.0–17.0)
Potassium: 4.2 mmol/L (ref 3.5–5.1)
Sodium: 134 mmol/L — ABNORMAL LOW (ref 135–145)
TCO2: 18 mmol/L — ABNORMAL LOW (ref 22–32)

## 2019-05-13 LAB — GLUCOSE, CAPILLARY: Glucose-Capillary: 105 mg/dL — ABNORMAL HIGH (ref 70–99)

## 2019-05-13 SURGERY — ARTERIOVENOUS (AV) FISTULA CREATION
Anesthesia: General | Site: Arm Upper | Laterality: Right

## 2019-05-13 MED ORDER — EPHEDRINE 5 MG/ML INJ
INTRAVENOUS | Status: AC
  Filled 2019-05-13: qty 10

## 2019-05-13 MED ORDER — ONDANSETRON HCL 4 MG/2ML IJ SOLN
INTRAMUSCULAR | Status: DC | PRN
  Administered 2019-05-13: 4 mg via INTRAVENOUS

## 2019-05-13 MED ORDER — FENTANYL CITRATE (PF) 100 MCG/2ML IJ SOLN: 25.0000 ug | INTRAMUSCULAR | Status: DC | PRN

## 2019-05-13 MED ORDER — DEXAMETHASONE SODIUM PHOSPHATE 10 MG/ML IJ SOLN
INTRAMUSCULAR | Status: AC
  Filled 2019-05-13: qty 1

## 2019-05-13 MED ORDER — CEFAZOLIN SODIUM-DEXTROSE 2-4 GM/100ML-% IV SOLN
2.0000 g | INTRAVENOUS | Status: AC
  Administered 2019-05-13: 2 g via INTRAVENOUS
  Filled 2019-05-13: qty 100

## 2019-05-13 MED ORDER — SODIUM CHLORIDE 0.9 % IV SOLN
INTRAVENOUS | Status: DC
  Administered 2019-05-13 (×2): via INTRAVENOUS

## 2019-05-13 MED ORDER — MIDAZOLAM HCL 5 MG/5ML IJ SOLN
INTRAMUSCULAR | Status: DC | PRN
  Administered 2019-05-13: 2 mg via INTRAVENOUS

## 2019-05-13 MED ORDER — PROMETHAZINE HCL 25 MG/ML IJ SOLN
INTRAMUSCULAR | Status: AC
  Filled 2019-05-13: qty 1

## 2019-05-13 MED ORDER — SODIUM CHLORIDE 0.9 % IV SOLN
INTRAVENOUS | Status: AC
  Filled 2019-05-13: qty 1.2

## 2019-05-13 MED ORDER — PROPOFOL 10 MG/ML IV BOLUS
INTRAVENOUS | Status: AC
  Filled 2019-05-13: qty 20

## 2019-05-13 MED ORDER — FENTANYL CITRATE (PF) 250 MCG/5ML IJ SOLN
INTRAMUSCULAR | Status: AC
  Filled 2019-05-13: qty 5

## 2019-05-13 MED ORDER — DEXAMETHASONE SODIUM PHOSPHATE 4 MG/ML IJ SOLN
INTRAMUSCULAR | Status: DC | PRN
  Administered 2019-05-13: 4 mg via INTRAVENOUS

## 2019-05-13 MED ORDER — PHENYLEPHRINE HCL-NACL 10-0.9 MG/250ML-% IV SOLN
INTRAVENOUS | Status: DC | PRN
  Administered 2019-05-13: 40 ug/min via INTRAVENOUS

## 2019-05-13 MED ORDER — LIDOCAINE 2% (20 MG/ML) 5 ML SYRINGE
INTRAMUSCULAR | Status: AC
  Filled 2019-05-13: qty 5

## 2019-05-13 MED ORDER — HEPARIN SODIUM (PORCINE) 1000 UNIT/ML IJ SOLN
INTRAMUSCULAR | Status: AC
  Filled 2019-05-13: qty 1

## 2019-05-13 MED ORDER — HEPARIN SODIUM (PORCINE) 1000 UNIT/ML IJ SOLN
INTRAMUSCULAR | Status: DC | PRN
  Administered 2019-05-13: 5000 [IU] via INTRAVENOUS

## 2019-05-13 MED ORDER — PROPOFOL 10 MG/ML IV BOLUS
INTRAVENOUS | Status: DC | PRN
  Administered 2019-05-13: 170 mg via INTRAVENOUS
  Administered 2019-05-13: 30 mg via INTRAVENOUS

## 2019-05-13 MED ORDER — PHENYLEPHRINE 40 MCG/ML (10ML) SYRINGE FOR IV PUSH (FOR BLOOD PRESSURE SUPPORT)
PREFILLED_SYRINGE | INTRAVENOUS | Status: DC | PRN
  Administered 2019-05-13 (×4): 80 ug via INTRAVENOUS

## 2019-05-13 MED ORDER — OXYCODONE-ACETAMINOPHEN 5-325 MG PO TABS: 2.0000 | ORAL_TABLET | Freq: Four times a day (QID) | ORAL | 0 refills | Status: DC | PRN

## 2019-05-13 MED ORDER — MIDAZOLAM HCL 2 MG/2ML IJ SOLN
INTRAMUSCULAR | Status: AC
  Filled 2019-05-13: qty 2

## 2019-05-13 MED ORDER — PHENYLEPHRINE 40 MCG/ML (10ML) SYRINGE FOR IV PUSH (FOR BLOOD PRESSURE SUPPORT)
PREFILLED_SYRINGE | INTRAVENOUS | Status: AC
  Filled 2019-05-13: qty 10

## 2019-05-13 MED ORDER — PROMETHAZINE HCL 25 MG/ML IJ SOLN
6.2500 mg | Freq: Once | INTRAMUSCULAR | Status: AC
  Administered 2019-05-13: 6.25 mg via INTRAVENOUS

## 2019-05-13 MED ORDER — LIDOCAINE HCL (PF) 1 % IJ SOLN
INTRAMUSCULAR | Status: AC
  Filled 2019-05-13: qty 30

## 2019-05-13 MED ORDER — FENTANYL CITRATE (PF) 100 MCG/2ML IJ SOLN
INTRAMUSCULAR | Status: DC | PRN
  Administered 2019-05-13: 100 ug via INTRAVENOUS
  Administered 2019-05-13: 50 ug via INTRAVENOUS

## 2019-05-13 MED ORDER — LIDOCAINE 2% (20 MG/ML) 5 ML SYRINGE
INTRAMUSCULAR | Status: DC | PRN
  Administered 2019-05-13: 60 mg via INTRAVENOUS

## 2019-05-13 MED ORDER — 0.9 % SODIUM CHLORIDE (POUR BTL) OPTIME
TOPICAL | Status: DC | PRN
  Administered 2019-05-13: 1000 mL

## 2019-05-13 MED ORDER — EPHEDRINE SULFATE-NACL 50-0.9 MG/10ML-% IV SOSY
PREFILLED_SYRINGE | INTRAVENOUS | Status: DC | PRN
  Administered 2019-05-13: 5 mg via INTRAVENOUS
  Administered 2019-05-13: 10 mg via INTRAVENOUS

## 2019-05-13 MED ORDER — ONDANSETRON HCL 4 MG/2ML IJ SOLN
INTRAMUSCULAR | Status: AC
  Filled 2019-05-13: qty 2

## 2019-05-13 MED ORDER — SODIUM CHLORIDE 0.9 % IV SOLN
INTRAVENOUS | Status: DC | PRN
  Administered 2019-05-13: 500 mL

## 2019-05-13 SURGICAL SUPPLY — 38 items
ADH SKN CLS APL DERMABOND .7 (GAUZE/BANDAGES/DRESSINGS) ×1
ARMBAND PINK RESTRICT EXTREMIT (MISCELLANEOUS) ×4 IMPLANT
CANISTER SUCT 3000ML PPV (MISCELLANEOUS) ×2 IMPLANT
CANNULA VESSEL 3MM 2 BLNT TIP (CANNULA) ×2 IMPLANT
CLIP VESOCCLUDE MED 6/CT (CLIP) ×2 IMPLANT
CLIP VESOCCLUDE SM WIDE 6/CT (CLIP) ×2 IMPLANT
COVER PROBE W GEL 5X96 (DRAPES) IMPLANT
COVER WAND RF STERILE (DRAPES) ×1 IMPLANT
DECANTER SPIKE VIAL GLASS SM (MISCELLANEOUS) ×2 IMPLANT
DERMABOND ADVANCED (GAUZE/BANDAGES/DRESSINGS) ×1
DERMABOND ADVANCED .7 DNX12 (GAUZE/BANDAGES/DRESSINGS) ×1 IMPLANT
DRAIN PENROSE 1/4X12 LTX STRL (WOUND CARE) ×2 IMPLANT
ELECT REM PT RETURN 9FT ADLT (ELECTROSURGICAL) ×2
ELECTRODE REM PT RTRN 9FT ADLT (ELECTROSURGICAL) ×1 IMPLANT
GLOVE BIO SURGEON STRL SZ 6 (GLOVE) ×1 IMPLANT
GLOVE BIO SURGEON STRL SZ7.5 (GLOVE) ×2 IMPLANT
GLOVE BIOGEL PI IND STRL 6 (GLOVE) IMPLANT
GLOVE BIOGEL PI IND STRL 6.5 (GLOVE) IMPLANT
GLOVE BIOGEL PI INDICATOR 6 (GLOVE) ×1
GLOVE BIOGEL PI INDICATOR 6.5 (GLOVE) ×1
GOWN STRL REUS W/ TWL LRG LVL3 (GOWN DISPOSABLE) ×3 IMPLANT
GOWN STRL REUS W/TWL LRG LVL3 (GOWN DISPOSABLE) ×6
KIT BASIN OR (CUSTOM PROCEDURE TRAY) ×2 IMPLANT
KIT TURNOVER KIT B (KITS) ×2 IMPLANT
LOOP VESSEL MINI RED (MISCELLANEOUS) IMPLANT
NS IRRIG 1000ML POUR BTL (IV SOLUTION) ×2 IMPLANT
PACK CV ACCESS (CUSTOM PROCEDURE TRAY) ×2 IMPLANT
PAD ARMBOARD 7.5X6 YLW CONV (MISCELLANEOUS) ×4 IMPLANT
SPONGE SURGIFOAM ABS GEL 100 (HEMOSTASIS) IMPLANT
SUT PROLENE 6 0 BV (SUTURE) ×2 IMPLANT
SUT PROLENE 6 0 CC (SUTURE) ×1 IMPLANT
SUT PROLENE 7 0 BV 1 (SUTURE) ×1 IMPLANT
SUT VIC AB 3-0 SH 27 (SUTURE) ×2
SUT VIC AB 3-0 SH 27X BRD (SUTURE) ×1 IMPLANT
SUT VICRYL 4-0 PS2 18IN ABS (SUTURE) ×2 IMPLANT
TOWEL GREEN STERILE (TOWEL DISPOSABLE) ×2 IMPLANT
UNDERPAD 30X30 (UNDERPADS AND DIAPERS) ×2 IMPLANT
WATER STERILE IRR 1000ML POUR (IV SOLUTION) ×2 IMPLANT

## 2019-05-13 NOTE — Op Note (Signed)
Procedure: Right Brachial Cephalic AV fistula  Preop: ESRD  Postop: ESRD  Anesthesia: General  Assistant: Ellsworth Lennox RNFA  Findings: 3.5 mm cephalic vein  Procedure: After obtaining informed consent, the patient was taken to the operating room.  After induction of general anesthesia, the left upper extremity was prepped and draped in usual sterile fashion.  A transverse incision was then made near the antecubital crease the left arm. The incision was carried into the subcutaneous tissues down to level of the cephalic vein. The cephalic vein was approximately 3.5 mm in diameter. It was of good quality. This was dissected free circumferentially and small side branches ligated and divided between silk ties or clips. Next the brachial artery was dissected free in the medial portion of the incision. The artery was  3-4 mm in diameter. The vessel loops were placed proximal and distal to the planned site of arteriotomy. The patient was given 5000 units of intravenous heparin. After appropriate circulation time, the vessel loops were used to control the artery. A longitudinal opening was made in the brachial artery.  The vein was ligated distally with a 2-0 silk tie. The vein was controlled proximally with a fine bulldog clamp. The vein was then swung over to the artery and sewn end of vein to side of artery using a running 6-0 Prolene suture. Just prior to completion of the anastomosis, everything was fore bled back bled and thoroughly flushed. The anastomosis was secured, vessel loops released, and there was a palpable thrill in the fistula immediately. After hemostasis was obtained, the subcutaneous tissues were reapproximated using a running 3-0 Vicryl suture. The skin was then closed with a 4 Vicryl subcuticular stitch. Dermabond was applied to the skin incision.  The patient had a palpable radial pulse at the end of the case.  Ruta Hinds, MD [facility] Office:  [phone] Pager: [phone]

## 2019-05-13 NOTE — Anesthesia Procedure Notes (Signed)
Procedure Name: LMA Insertion Date/Time: 05/13/2019 7:38 AM Performed by: Jenne Campus, CRNA Pre-anesthesia Checklist: Patient identified, Emergency Drugs available, Suction available and Patient being monitored Patient Re-evaluated:Patient Re-evaluated prior to induction Oxygen Delivery Method: Circle System Utilized Preoxygenation: Pre-oxygenation with 100% oxygen Induction Type: IV induction Ventilation: Mask ventilation without difficulty LMA: LMA inserted LMA Size: 4.0 Number of attempts: 1 Airway Equipment and Method: Bite block Placement Confirmation: positive ETCO2 and breath sounds checked- equal and bilateral Tube secured with: Tape Dental Injury: Teeth and Oropharynx as per pre-operative assessment

## 2019-05-13 NOTE — Transfer of Care (Signed)
Immediate Anesthesia Transfer of Care Note  Patient: Jeremiah [name]  Procedure(s) Performed: ARTERIOVENOUS (AV) BRACHIOCEPHALIC FISTULA CREATION RIGHT ARM (Right Arm Upper)  Patient Location: PACU  Anesthesia Type:General  Level of Consciousness: sedated and patient cooperative  Airway & Oxygen Therapy: Patient Spontanous Breathing and Patient connected to nasal cannula oxygen  Post-op Assessment: Report given to RN and Post -op Vital signs reviewed and stable  Post vital signs: Reviewed  Last Vitals:  Vitals Value Taken Time  BP 134/67 05/13/19 0928  Temp    Pulse 63 05/13/19 0929  Resp 10 05/13/19 0929  SpO2 100 % 05/13/19 0929  Vitals shown include unvalidated device data.  Last Pain:  Vitals:   05/13/19 0618  PainSc: 0-No pain      Patients Stated Pain Goal: 3 (09/81/19 1478)  Complications: No apparent anesthesia complications

## 2019-05-13 NOTE — Discharge Instructions (Signed)
Our office will call you with a postoperative visit sometime in the next 4 to 6 weeks.  Call our office if you develop any numbness or tingling in your hand.  You may wash the incision with gentle soap and water after 48 hours.

## 2019-05-13 NOTE — Interval H&P Note (Signed)
History and Physical Interval Note:  05/13/2019 7:20 AM  Jeremiah [name]  has presented today for surgery, with the diagnosis of CHRONIC KIDNEY DISEASE.  The various methods of treatment have been discussed with the patient and family. After consideration of risks, benefits and other options for treatment, the patient has consented to  Procedure(s): ARTERIOVENOUS (AV) BRACHIOCEPHALIC FISTULA CREATION RIGHT ARM (Right) as a surgical intervention.  The patient's history has been reviewed, patient examined, no change in status, stable for surgery.  I have reviewed the patient's chart and labs.  Questions were answered to the patient's satisfaction.     Ruta Hinds

## 2019-05-13 NOTE — Anesthesia Postprocedure Evaluation (Signed)
Anesthesia Post Note  Patient: Jeremiah [name]  Procedure(s) Performed: ARTERIOVENOUS (AV) BRACHIOCEPHALIC FISTULA CREATION RIGHT ARM (Right Arm Upper)     Anesthesia Type: General Level of consciousness: awake Pain management: pain level controlled Vital Signs Assessment: post-procedure vital signs reviewed and stable Respiratory status: spontaneous breathing Cardiovascular status: stable Postop Assessment: no apparent nausea or vomiting Anesthetic complications: no    Last Vitals:  Vitals:   05/13/19 1000 05/13/19 1030  BP: 140/72 (!) 142/74  Pulse: 63 68  Resp: (!) 9 (!) 9  Temp:  (!) 36.4 C  SpO2: 100% 100%    Last Pain:  Vitals:   05/13/19 1030  PainSc: Asleep                 Ivey Cina

## 2019-05-14 ENCOUNTER — Encounter (HOSPITAL_COMMUNITY): Payer: Self-pay | Admitting: Vascular Surgery

## 2019-05-14 LAB — GLUCOSE, CAPILLARY: Glucose-Capillary: 99 mg/dL (ref 70–99)

## 2019-05-15 ENCOUNTER — Other Ambulatory Visit: Payer: Self-pay

## 2019-05-15 ENCOUNTER — Emergency Department (HOSPITAL_COMMUNITY): Payer: Medicaid Other

## 2019-05-15 ENCOUNTER — Inpatient Hospital Stay (HOSPITAL_COMMUNITY)
Admission: EM | Admit: 2019-05-15 | Discharge: 2019-05-20 | DRG: 673 | Disposition: A | Discharge status: Home / Self Care | Payer: Medicaid Other | Attending: Family Medicine | Admitting: Family Medicine

## 2019-05-15 ENCOUNTER — Encounter (HOSPITAL_COMMUNITY): Payer: Self-pay | Admitting: *Deleted

## 2019-05-15 DIAGNOSIS — Z992 Dependence on renal dialysis: Secondary | ICD-10-CM

## 2019-05-15 DIAGNOSIS — D509 Iron deficiency anemia, unspecified: Secondary | ICD-10-CM | POA: Diagnosis present

## 2019-05-15 DIAGNOSIS — I422 Other hypertrophic cardiomyopathy: Secondary | ICD-10-CM | POA: Diagnosis present

## 2019-05-15 DIAGNOSIS — N186 End stage renal disease: Secondary | ICD-10-CM

## 2019-05-15 DIAGNOSIS — N19 Unspecified kidney failure: Secondary | ICD-10-CM

## 2019-05-15 DIAGNOSIS — E875 Hyperkalemia: Secondary | ICD-10-CM | POA: Diagnosis present

## 2019-05-15 DIAGNOSIS — I120 Hypertensive chronic kidney disease with stage 5 chronic kidney disease or end stage renal disease: Principal | ICD-10-CM | POA: Diagnosis present

## 2019-05-15 DIAGNOSIS — G8929 Other chronic pain: Secondary | ICD-10-CM | POA: Diagnosis present

## 2019-05-15 DIAGNOSIS — Z823 Family history of stroke: Secondary | ICD-10-CM

## 2019-05-15 DIAGNOSIS — Z841 Family history of disorders of kidney and ureter: Secondary | ICD-10-CM

## 2019-05-15 DIAGNOSIS — D631 Anemia in chronic kidney disease: Secondary | ICD-10-CM | POA: Diagnosis present

## 2019-05-15 DIAGNOSIS — R011 Cardiac murmur, unspecified: Secondary | ICD-10-CM | POA: Diagnosis present

## 2019-05-15 DIAGNOSIS — Z23 Encounter for immunization: Secondary | ICD-10-CM

## 2019-05-15 DIAGNOSIS — E1122 Type 2 diabetes mellitus with diabetic chronic kidney disease: Secondary | ICD-10-CM | POA: Diagnosis present

## 2019-05-15 DIAGNOSIS — Z8349 Family history of other endocrine, nutritional and metabolic diseases: Secondary | ICD-10-CM

## 2019-05-15 DIAGNOSIS — Z8249 Family history of ischemic heart disease and other diseases of the circulatory system: Secondary | ICD-10-CM

## 2019-05-15 DIAGNOSIS — Z794 Long term (current) use of insulin: Secondary | ICD-10-CM

## 2019-05-15 DIAGNOSIS — Z8701 Personal history of pneumonia (recurrent): Secondary | ICD-10-CM

## 2019-05-15 DIAGNOSIS — N5089 Other specified disorders of the male genital organs: Secondary | ICD-10-CM | POA: Diagnosis present

## 2019-05-15 DIAGNOSIS — Z7982 Long term (current) use of aspirin: Secondary | ICD-10-CM

## 2019-05-15 DIAGNOSIS — E8770 Fluid overload, unspecified: Secondary | ICD-10-CM | POA: Diagnosis present

## 2019-05-15 DIAGNOSIS — E785 Hyperlipidemia, unspecified: Secondary | ICD-10-CM | POA: Diagnosis present

## 2019-05-15 DIAGNOSIS — Z833 Family history of diabetes mellitus: Secondary | ICD-10-CM

## 2019-05-15 DIAGNOSIS — N185 Chronic kidney disease, stage 5: Secondary | ICD-10-CM

## 2019-05-15 DIAGNOSIS — E11311 Type 2 diabetes mellitus with unspecified diabetic retinopathy with macular edema: Secondary | ICD-10-CM | POA: Diagnosis present

## 2019-05-15 DIAGNOSIS — Z79899 Other long term (current) drug therapy: Secondary | ICD-10-CM

## 2019-05-15 DIAGNOSIS — R600 Localized edema: Secondary | ICD-10-CM

## 2019-05-15 DIAGNOSIS — Z20828 Contact with and (suspected) exposure to other viral communicable diseases: Secondary | ICD-10-CM | POA: Diagnosis present

## 2019-05-15 DIAGNOSIS — Z8673 Personal history of transient ischemic attack (TIA), and cerebral infarction without residual deficits: Secondary | ICD-10-CM

## 2019-05-15 DIAGNOSIS — E1152 Type 2 diabetes mellitus with diabetic peripheral angiopathy with gangrene: Secondary | ICD-10-CM | POA: Diagnosis present

## 2019-05-15 DIAGNOSIS — E1142 Type 2 diabetes mellitus with diabetic polyneuropathy: Secondary | ICD-10-CM | POA: Diagnosis present

## 2019-05-15 DIAGNOSIS — M1990 Unspecified osteoarthritis, unspecified site: Secondary | ICD-10-CM | POA: Diagnosis present

## 2019-05-15 DIAGNOSIS — E1165 Type 2 diabetes mellitus with hyperglycemia: Secondary | ICD-10-CM | POA: Diagnosis present

## 2019-05-15 DIAGNOSIS — Z9114 Patient's other noncompliance with medication regimen: Secondary | ICD-10-CM

## 2019-05-15 DIAGNOSIS — E872 Acidosis: Secondary | ICD-10-CM | POA: Diagnosis present

## 2019-05-15 LAB — CBC
HCT: 31.2 % — ABNORMAL LOW (ref 39.0–52.0)
Hemoglobin: 10 g/dL — ABNORMAL LOW (ref 13.0–17.0)
MCH: 23.7 pg — ABNORMAL LOW (ref 26.0–34.0)
MCHC: 32.1 g/dL (ref 30.0–36.0)
MCV: 73.9 fL — ABNORMAL LOW (ref 80.0–100.0)
Platelets: 259 10*3/uL (ref 150–400)
RBC: 4.22 MIL/uL (ref 4.22–5.81)
RDW: 18.9 % — ABNORMAL HIGH (ref 11.5–15.5)
WBC: 5.6 10*3/uL (ref 4.0–10.5)
nRBC: 0 % (ref 0.0–0.2)

## 2019-05-15 MED ORDER — SODIUM CHLORIDE 0.9% FLUSH
3.0000 mL | Freq: Once | INTRAVENOUS | Status: AC
  Administered 2019-05-18: 3 mL via INTRAVENOUS

## 2019-05-15 NOTE — ED Triage Notes (Signed)
The pt is c/o both feet and ankle swelling for one week he also has swelling in his genitalia  He just had a dialysis catheter placed last week in his rt arm

## 2019-05-16 ENCOUNTER — Inpatient Hospital Stay (HOSPITAL_COMMUNITY): Payer: Medicaid Other

## 2019-05-16 ENCOUNTER — Encounter (HOSPITAL_COMMUNITY): Payer: Self-pay | Admitting: Diagnostic Radiology

## 2019-05-16 ENCOUNTER — Other Ambulatory Visit: Payer: Self-pay

## 2019-05-16 DIAGNOSIS — E1165 Type 2 diabetes mellitus with hyperglycemia: Secondary | ICD-10-CM | POA: Diagnosis present

## 2019-05-16 DIAGNOSIS — E1152 Type 2 diabetes mellitus with diabetic peripheral angiopathy with gangrene: Secondary | ICD-10-CM | POA: Diagnosis present

## 2019-05-16 DIAGNOSIS — Z20828 Contact with and (suspected) exposure to other viral communicable diseases: Secondary | ICD-10-CM | POA: Diagnosis present

## 2019-05-16 DIAGNOSIS — E875 Hyperkalemia: Secondary | ICD-10-CM | POA: Diagnosis present

## 2019-05-16 DIAGNOSIS — E8770 Fluid overload, unspecified: Secondary | ICD-10-CM | POA: Diagnosis present

## 2019-05-16 DIAGNOSIS — N5089 Other specified disorders of the male genital organs: Secondary | ICD-10-CM | POA: Diagnosis present

## 2019-05-16 DIAGNOSIS — E785 Hyperlipidemia, unspecified: Secondary | ICD-10-CM | POA: Diagnosis present

## 2019-05-16 DIAGNOSIS — I422 Other hypertrophic cardiomyopathy: Secondary | ICD-10-CM | POA: Diagnosis present

## 2019-05-16 DIAGNOSIS — E8779 Other fluid overload: Secondary | ICD-10-CM

## 2019-05-16 DIAGNOSIS — N19 Unspecified kidney failure: Secondary | ICD-10-CM | POA: Diagnosis not present

## 2019-05-16 DIAGNOSIS — I159 Secondary hypertension, unspecified: Secondary | ICD-10-CM | POA: Diagnosis not present

## 2019-05-16 DIAGNOSIS — N186 End stage renal disease: Secondary | ICD-10-CM | POA: Diagnosis present

## 2019-05-16 DIAGNOSIS — E11311 Type 2 diabetes mellitus with unspecified diabetic retinopathy with macular edema: Secondary | ICD-10-CM | POA: Diagnosis present

## 2019-05-16 DIAGNOSIS — Z8701 Personal history of pneumonia (recurrent): Secondary | ICD-10-CM | POA: Diagnosis not present

## 2019-05-16 DIAGNOSIS — I96 Gangrene, not elsewhere classified: Secondary | ICD-10-CM | POA: Diagnosis not present

## 2019-05-16 DIAGNOSIS — Z992 Dependence on renal dialysis: Secondary | ICD-10-CM | POA: Diagnosis not present

## 2019-05-16 DIAGNOSIS — M1990 Unspecified osteoarthritis, unspecified site: Secondary | ICD-10-CM | POA: Diagnosis present

## 2019-05-16 DIAGNOSIS — R600 Localized edema: Secondary | ICD-10-CM | POA: Diagnosis not present

## 2019-05-16 DIAGNOSIS — N185 Chronic kidney disease, stage 5: Secondary | ICD-10-CM | POA: Diagnosis not present

## 2019-05-16 DIAGNOSIS — I10 Essential (primary) hypertension: Secondary | ICD-10-CM | POA: Diagnosis not present

## 2019-05-16 DIAGNOSIS — G8929 Other chronic pain: Secondary | ICD-10-CM | POA: Diagnosis present

## 2019-05-16 DIAGNOSIS — M7989 Other specified soft tissue disorders: Secondary | ICD-10-CM | POA: Diagnosis present

## 2019-05-16 DIAGNOSIS — E872 Acidosis: Secondary | ICD-10-CM | POA: Diagnosis present

## 2019-05-16 DIAGNOSIS — D509 Iron deficiency anemia, unspecified: Secondary | ICD-10-CM | POA: Diagnosis present

## 2019-05-16 DIAGNOSIS — E1122 Type 2 diabetes mellitus with diabetic chronic kidney disease: Secondary | ICD-10-CM | POA: Diagnosis present

## 2019-05-16 DIAGNOSIS — D631 Anemia in chronic kidney disease: Secondary | ICD-10-CM | POA: Diagnosis present

## 2019-05-16 DIAGNOSIS — R011 Cardiac murmur, unspecified: Secondary | ICD-10-CM | POA: Diagnosis present

## 2019-05-16 DIAGNOSIS — Z9114 Patient's other noncompliance with medication regimen: Secondary | ICD-10-CM | POA: Diagnosis not present

## 2019-05-16 DIAGNOSIS — Z23 Encounter for immunization: Secondary | ICD-10-CM | POA: Diagnosis not present

## 2019-05-16 DIAGNOSIS — I120 Hypertensive chronic kidney disease with stage 5 chronic kidney disease or end stage renal disease: Secondary | ICD-10-CM | POA: Diagnosis present

## 2019-05-16 DIAGNOSIS — Z794 Long term (current) use of insulin: Secondary | ICD-10-CM | POA: Diagnosis not present

## 2019-05-16 DIAGNOSIS — E1142 Type 2 diabetes mellitus with diabetic polyneuropathy: Secondary | ICD-10-CM | POA: Diagnosis present

## 2019-05-16 HISTORY — PX: IR FLUORO GUIDE CV LINE RIGHT: IMG2283

## 2019-05-16 HISTORY — PX: IR US GUIDE VASC ACCESS RIGHT: IMG2390

## 2019-05-16 LAB — RENAL FUNCTION PANEL
Albumin: 2 g/dL — ABNORMAL LOW (ref 3.5–5.0)
Anion gap: 16 — ABNORMAL HIGH (ref 5–15)
BUN: 111 mg/dL — ABNORMAL HIGH (ref 6–20)
CO2: 14 mmol/L — ABNORMAL LOW (ref 22–32)
Calcium: 7.2 mg/dL — ABNORMAL LOW (ref 8.9–10.3)
Chloride: 104 mmol/L (ref 98–111)
Creatinine, Ser: 12.44 mg/dL — ABNORMAL HIGH (ref 0.61–1.24)
GFR calc Af Amer: 5 mL/min — ABNORMAL LOW (ref >60)
GFR calc non Af Amer: 4 mL/min — ABNORMAL LOW (ref >60)
Glucose, Bld: 353 mg/dL — ABNORMAL HIGH (ref 70–99)
Phosphorus: 9.5 mg/dL — ABNORMAL HIGH (ref 2.5–4.6)
Potassium: 5 mmol/L (ref 3.5–5.1)
Sodium: 134 mmol/L — ABNORMAL LOW (ref 135–145)

## 2019-05-16 LAB — TROPONIN I (HIGH SENSITIVITY)
Troponin I (High Sensitivity): 19 ng/L — ABNORMAL HIGH (ref <18)
Troponin I (High Sensitivity): 20 ng/L — ABNORMAL HIGH (ref <18)

## 2019-05-16 LAB — BASIC METABOLIC PANEL
Anion gap: 13 (ref 5–15)
BUN: 114 mg/dL — ABNORMAL HIGH (ref 6–20)
CO2: 17 mmol/L — ABNORMAL LOW (ref 22–32)
Calcium: 7.4 mg/dL — ABNORMAL LOW (ref 8.9–10.3)
Chloride: 106 mmol/L (ref 98–111)
Creatinine, Ser: 12.56 mg/dL — ABNORMAL HIGH (ref 0.61–1.24)
GFR calc Af Amer: 5 mL/min — ABNORMAL LOW (ref >60)
GFR calc non Af Amer: 4 mL/min — ABNORMAL LOW (ref >60)
Glucose, Bld: 197 mg/dL — ABNORMAL HIGH (ref 70–99)
Potassium: 5.1 mmol/L (ref 3.5–5.1)
Sodium: 136 mmol/L (ref 135–145)

## 2019-05-16 LAB — GLUCOSE, CAPILLARY
Glucose-Capillary: 280 mg/dL — ABNORMAL HIGH (ref 70–99)
Glucose-Capillary: 443 mg/dL — ABNORMAL HIGH (ref 70–99)
Glucose-Capillary: 308 mg/dL — ABNORMAL HIGH (ref 70–99)
Glucose-Capillary: 216 mg/dL — ABNORMAL HIGH (ref 70–99)
Glucose-Capillary: 45 mg/dL — ABNORMAL LOW (ref 70–99)
Glucose-Capillary: 109 mg/dL — ABNORMAL HIGH (ref 70–99)
Glucose-Capillary: 153 mg/dL — ABNORMAL HIGH (ref 70–99)

## 2019-05-16 LAB — PROTIME-INR
INR: 1 (ref 0.8–1.2)
Prothrombin Time: 12.7 seconds (ref 11.4–15.2)

## 2019-05-16 LAB — CBC
HCT: 29.1 % — ABNORMAL LOW (ref 39.0–52.0)
Hemoglobin: 9.4 g/dL — ABNORMAL LOW (ref 13.0–17.0)
MCH: 23.4 pg — ABNORMAL LOW (ref 26.0–34.0)
MCHC: 32.3 g/dL (ref 30.0–36.0)
MCV: 72.6 fL — ABNORMAL LOW (ref 80.0–100.0)
Platelets: 233 10*3/uL (ref 150–400)
RBC: 4.01 MIL/uL — ABNORMAL LOW (ref 4.22–5.81)
RDW: 18.7 % — ABNORMAL HIGH (ref 11.5–15.5)
WBC: 4.9 10*3/uL (ref 4.0–10.5)
nRBC: 0 % (ref 0.0–0.2)

## 2019-05-16 LAB — HEMOGLOBIN A1C
Hgb A1c MFr Bld: 10.8 % — ABNORMAL HIGH (ref 4.8–5.6)
Mean Plasma Glucose: 263.26 mg/dL

## 2019-05-16 LAB — MRSA PCR SCREENING: MRSA by PCR: NEGATIVE

## 2019-05-16 LAB — SARS CORONAVIRUS 2 (TAT 6-24 HRS): SARS Coronavirus 2: NEGATIVE

## 2019-05-16 LAB — HEPATITIS B SURFACE ANTIGEN: Hepatitis B Surface Ag: NONREACTIVE

## 2019-05-16 LAB — HIV ANTIBODY (ROUTINE TESTING W REFLEX): HIV Screen 4th Generation wRfx: NONREACTIVE

## 2019-05-16 MED ORDER — SODIUM CHLORIDE 0.9 % IV SOLN
INTRAVENOUS | Status: AC | PRN
  Administered 2019-05-16: 10 mL/h via INTRAVENOUS

## 2019-05-16 MED ORDER — DARBEPOETIN ALFA 60 MCG/0.3ML IJ SOSY: 60.0000 ug | PREFILLED_SYRINGE | INTRAMUSCULAR | Status: DC

## 2019-05-16 MED ORDER — ACETAMINOPHEN 325 MG PO TABS
650.0000 mg | ORAL_TABLET | Freq: Four times a day (QID) | ORAL | Status: DC | PRN
  Administered 2019-05-16 – 2019-05-17 (×3): 650 mg via ORAL
  Filled 2019-05-16 (×2): qty 2

## 2019-05-16 MED ORDER — DEXTROSE 50 % IV SOLN
INTRAVENOUS | Status: AC
  Filled 2019-05-16: qty 50

## 2019-05-16 MED ORDER — CALCIUM CARBONATE ANTACID 750 MG PO CHEW: 1.0000 | CHEWABLE_TABLET | Freq: Three times a day (TID) | ORAL | Status: DC | PRN

## 2019-05-16 MED ORDER — ASPIRIN EC 81 MG PO TBEC
81.0000 mg | DELAYED_RELEASE_TABLET | Freq: Every day | ORAL | Status: DC
  Administered 2019-05-16 – 2019-05-20 (×5): 81 mg via ORAL
  Filled 2019-05-16 (×5): qty 1

## 2019-05-16 MED ORDER — CEFAZOLIN SODIUM-DEXTROSE 2-4 GM/100ML-% IV SOLN
2.0000 g | INTRAVENOUS | Status: AC
  Administered 2019-05-16: 18:00:00 2 g via INTRAVENOUS

## 2019-05-16 MED ORDER — ALTEPLASE 2 MG IJ SOLR: 2.0000 mg | Freq: Once | INTRAMUSCULAR | Status: DC | PRN

## 2019-05-16 MED ORDER — MIDAZOLAM HCL 2 MG/2ML IJ SOLN
INTRAMUSCULAR | Status: AC
  Filled 2019-05-16: qty 2

## 2019-05-16 MED ORDER — CARVEDILOL 12.5 MG PO TABS: 25.0000 mg | ORAL_TABLET | Freq: Every day | ORAL | Status: DC

## 2019-05-16 MED ORDER — HYDRALAZINE HCL 25 MG PO TABS
25.0000 mg | ORAL_TABLET | Freq: Three times a day (TID) | ORAL | Status: DC
  Administered 2019-05-16 – 2019-05-19 (×10): 25 mg via ORAL
  Filled 2019-05-16 (×10): qty 1

## 2019-05-16 MED ORDER — AMLODIPINE BESYLATE 5 MG PO TABS: 10.0000 mg | ORAL_TABLET | Freq: Every day | ORAL | Status: DC

## 2019-05-16 MED ORDER — HEPARIN SODIUM (PORCINE) 1000 UNIT/ML DIALYSIS: 1000.0000 [IU] | INTRAMUSCULAR | Status: DC | PRN

## 2019-05-16 MED ORDER — CALCIUM ACETATE (PHOS BINDER) 667 MG PO CAPS
1334.0000 mg | ORAL_CAPSULE | Freq: Three times a day (TID) | ORAL | Status: DC
  Administered 2019-05-17 – 2019-05-20 (×10): 1334 mg via ORAL
  Filled 2019-05-16 (×10): qty 2

## 2019-05-16 MED ORDER — DEXTROSE 50 % IV SOLN
25.0000 g | INTRAVENOUS | Status: AC
  Administered 2019-05-16: 25 g via INTRAVENOUS

## 2019-05-16 MED ORDER — CARVEDILOL 12.5 MG PO TABS
12.5000 mg | ORAL_TABLET | Freq: Two times a day (BID) | ORAL | Status: DC
  Administered 2019-05-17 – 2019-05-20 (×6): 12.5 mg via ORAL
  Filled 2019-05-16 (×7): qty 1

## 2019-05-16 MED ORDER — FENTANYL CITRATE (PF) 100 MCG/2ML IJ SOLN
INTRAMUSCULAR | Status: AC
  Filled 2019-05-16: qty 2

## 2019-05-16 MED ORDER — CALCITRIOL 0.5 MCG PO CAPS
1.0000 ug | ORAL_CAPSULE | Freq: Every day | ORAL | Status: DC
  Administered 2019-05-16 – 2019-05-20 (×5): 1 ug via ORAL
  Filled 2019-05-16 (×5): qty 2

## 2019-05-16 MED ORDER — HEPARIN SODIUM (PORCINE) 5000 UNIT/ML IJ SOLN
5000.0000 [IU] | Freq: Three times a day (TID) | INTRAMUSCULAR | Status: DC
  Administered 2019-05-17 – 2019-05-20 (×10): 5000 [IU] via SUBCUTANEOUS
  Filled 2019-05-16 (×10): qty 1

## 2019-05-16 MED ORDER — HYDRALAZINE HCL 25 MG PO TABS: 25.0000 mg | ORAL_TABLET | ORAL | Status: DC

## 2019-05-16 MED ORDER — SODIUM CHLORIDE 0.9 % IV SOLN: 100.0000 mL | INTRAVENOUS | Status: DC | PRN

## 2019-05-16 MED ORDER — FERROUS SULFATE 325 (65 FE) MG PO TABS
325.0000 mg | ORAL_TABLET | Freq: Two times a day (BID) | ORAL | Status: DC
  Administered 2019-05-17 – 2019-05-20 (×6): 325 mg via ORAL
  Filled 2019-05-16 (×6): qty 1

## 2019-05-16 MED ORDER — FENTANYL CITRATE (PF) 100 MCG/2ML IJ SOLN
INTRAMUSCULAR | Status: AC | PRN
  Administered 2019-05-16: 50 ug via INTRAVENOUS

## 2019-05-16 MED ORDER — FUROSEMIDE 10 MG/ML IJ SOLN
120.0000 mg | Freq: Once | INTRAVENOUS | Status: AC
  Administered 2019-05-16: 120 mg via INTRAVENOUS
  Filled 2019-05-16: qty 2

## 2019-05-16 MED ORDER — INSULIN GLARGINE 100 UNIT/ML ~~LOC~~ SOLN
10.0000 [IU] | Freq: Once | SUBCUTANEOUS | Status: AC
  Administered 2019-05-16: 10 [IU] via SUBCUTANEOUS
  Filled 2019-05-16 (×2): qty 0.1

## 2019-05-16 MED ORDER — LIDOCAINE-PRILOCAINE 2.5-2.5 % EX CREA: 1.0000 "application " | TOPICAL_CREAM | CUTANEOUS | Status: DC | PRN

## 2019-05-16 MED ORDER — ACETAMINOPHEN 650 MG RE SUPP: 650.0000 mg | Freq: Four times a day (QID) | RECTAL | Status: DC | PRN

## 2019-05-16 MED ORDER — INSULIN GLARGINE 100 UNIT/ML ~~LOC~~ SOLN
5.0000 [IU] | Freq: Every day | SUBCUTANEOUS | Status: DC
  Administered 2019-05-16: 5 [IU] via SUBCUTANEOUS
  Filled 2019-05-16 (×2): qty 0.05

## 2019-05-16 MED ORDER — ATORVASTATIN CALCIUM 40 MG PO TABS
40.0000 mg | ORAL_TABLET | Freq: Every day | ORAL | Status: DC
  Administered 2019-05-16 – 2019-05-20 (×5): 40 mg via ORAL
  Filled 2019-05-16 (×5): qty 1

## 2019-05-16 MED ORDER — LIDOCAINE HCL 1 % IJ SOLN
INTRAMUSCULAR | Status: AC
  Filled 2019-05-16: qty 20

## 2019-05-16 MED ORDER — HEPARIN SODIUM (PORCINE) 1000 UNIT/ML IJ SOLN
INTRAMUSCULAR | Status: AC
  Filled 2019-05-16: qty 1

## 2019-05-16 MED ORDER — CALCIUM CARBONATE ANTACID 750 MG PO CHEW: 1.0000 | CHEWABLE_TABLET | ORAL | Status: DC

## 2019-05-16 MED ORDER — MIDAZOLAM HCL 2 MG/2ML IJ SOLN
INTRAMUSCULAR | Status: AC | PRN
  Administered 2019-05-16: 1 mg via INTRAVENOUS

## 2019-05-16 MED ORDER — CARVEDILOL 12.5 MG PO TABS: 12.5000 mg | ORAL_TABLET | Freq: Every day | ORAL | Status: DC

## 2019-05-16 MED ORDER — PENTAFLUOROPROP-TETRAFLUOROETH EX AERO: 1.0000 "application " | INHALATION_SPRAY | CUTANEOUS | Status: DC | PRN

## 2019-05-16 MED ORDER — INSULIN ASPART 100 UNIT/ML ~~LOC~~ SOLN
0.0000 [IU] | Freq: Three times a day (TID) | SUBCUTANEOUS | Status: DC
  Administered 2019-05-16: 3 [IU] via SUBCUTANEOUS
  Administered 2019-05-16 – 2019-05-17 (×2): 9 [IU] via SUBCUTANEOUS
  Administered 2019-05-17: 5 [IU] via SUBCUTANEOUS

## 2019-05-16 MED ORDER — KIDNEY FAILURE BOOK
Freq: Once | Status: AC
  Administered 2019-05-16: 06:00:00
  Filled 2019-05-16: qty 1

## 2019-05-16 MED ORDER — LIDOCAINE HCL (PF) 1 % IJ SOLN: 5.0000 mL | INTRAMUSCULAR | Status: DC | PRN

## 2019-05-16 MED ORDER — CEFAZOLIN SODIUM-DEXTROSE 2-4 GM/100ML-% IV SOLN
INTRAVENOUS | Status: AC
  Administered 2019-05-16: 2 g via INTRAVENOUS
  Filled 2019-05-16: qty 100

## 2019-05-16 MED ORDER — HEPARIN SODIUM (PORCINE) 5000 UNIT/ML IJ SOLN
5000.0000 [IU] | Freq: Three times a day (TID) | INTRAMUSCULAR | Status: DC
  Administered 2019-05-16: 5000 [IU] via SUBCUTANEOUS
  Filled 2019-05-16: qty 1

## 2019-05-16 MED ORDER — CHLORHEXIDINE GLUCONATE CLOTH 2 % EX PADS
6.0000 | MEDICATED_PAD | Freq: Every day | CUTANEOUS | Status: DC
  Administered 2019-05-16 – 2019-05-20 (×5): 6 via TOPICAL
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
## 2019-05-16 NOTE — Procedures (Signed)
Interventional Radiology Procedure:   Indications: ESRD and needs dialysis  Procedure: Tunneled dialysis catheter  Findings: Right jugular HD catheter, tip at SVC/RA junction  Complications: None     EBL: less than 10 ml  Plan: Catheter is ready for use.     Jeremiah [name] R. Anselm Pancoast, MD  Pager: [phone]
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
## 2019-05-16 NOTE — Plan of Care (Signed)
  Problem: Education: Goal: Knowledge of General Education information will improve Description: Including pain rating scale, medication(s)/side effects and non-pharmacologic comfort measures Outcome: Progressing   Problem: Activity: Goal: Risk for activity intolerance will decrease Outcome: Progressing   

## 2019-05-16 NOTE — Progress Notes (Signed)
Awaiting IR to place HD cath and pt to start HD afterwards. Pt is NPO for procedure. Called IR to verify time of procedure. Time is unknown, IR stated they would call me when they were ready for the pt.  Paulla Fore, RN, BSN.

## 2019-05-16 NOTE — Progress Notes (Signed)
Pt back from IR. Pt is alert and orientedx4. Pt denies any pain, he is sitting up and eating a snack (diet order in), no distress noted, VSS. HD was notified that pt has HD cath in place now since plan is for pt to have HD today. Call light within reach and bed in lowest position.   Paulla Fore, RN, BSN
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
## 2019-05-16 NOTE — Progress Notes (Addendum)
Notified MD [facility] intern pager, that CBG was 443. Awaiting orders   0751- MD called back after paging Family med pager,  and gave orders to give pt 9 units of insulin (novolog).  Paulla Fore, RN, BSN

## 2019-05-16 NOTE — Progress Notes (Signed)
Inpatient Diabetes Program Recommendations  AACE/ADA: New Consensus Statement on Inpatient Glycemic Control (2015)  Target Ranges:  Prepandial:   less than 140 mg/dL      Peak postprandial:   less than 180 mg/dL (1-2 hours)      Critically ill patients:  140 - 180 mg/dL   Lab Results  Component Value Date   GLUCAP 216 (H) 05/16/2019   HGBA1C 10.8 (H) 05/16/2019    Review of Glycemic Control Results for KIMONI, PAGLIARULO (MRN [id number]) as of 05/16/2019 15:38  Ref. Range 05/16/2019 07:20 05/16/2019 10:14 05/16/2019 11:44  Glucose-Capillary Latest Ref Range: 70 - 99 mg/dL 443 (H) 308 (H) 216 (H)   Diabetes history: Type 2 DM Outpatient Diabetes medications: Humalog 5 units TID, Lantus 10 units QAM Current orders for Inpatient glycemic control: Lantus 5 units QHS, Novolog 0-9 units TID  Inpatient Diabetes Program Recommendations:    Spoke to patient regarding outpatient diabetes management.  Reviewed patient's current A1c of 10.8%. Explained what a A1c is and what it measures. Also reviewed goal A1c with patient, importance of good glucose control @ home, and blood sugar goals. Reviewed patho of DM, need for insulin, role of kidney with increased risk of hypoglycemia with insulin use, vascular changes, when to call MD and commodities.  Patient has a meter, reports checking 2-3 times per day with a range of 250's mg/dL.  Denies drinking soda, however, reports frequently missing meal coverage. Patient has a follow up with PCP. Will attach endo list to DC summary.  Patient has no further questions.   Thanks, Bronson Curb, MSN, RNC-OB Diabetes Coordinator [phone] (8a-5p)
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
## 2019-05-16 NOTE — Progress Notes (Signed)
New Admission Note: ? Arrival Method: via stretcher  Mental Orientation: A/O x 4 Telemetry: Box #6 NSR Assessment: Completed Skin: Refer to flowsheet IV: Left AC Pain: 0/10 Tubes: Safety Measures: Safety Fall Prevention Plan discussed with patient. Admission: Completed 5 [place] Orientation: Patient has been orientated to the room, unit and the staff. Family: Orders have been reviewed and are being implemented. Will continue to monitor the patient. Call light has been placed within reach and bed alarm has been activated.  ? [employer], [place]

## 2019-05-17 ENCOUNTER — Inpatient Hospital Stay (HOSPITAL_COMMUNITY)
Admission: RE | Admit: 2019-05-17 | Discharge: 2019-05-17 | Disposition: A | Discharge status: Home / Self Care | Payer: Medicaid Other | Source: Ambulatory Visit | Attending: Internal Medicine | Admitting: Internal Medicine

## 2019-05-17 ENCOUNTER — Encounter (HOSPITAL_COMMUNITY): Payer: Medicaid Other

## 2019-05-17 DIAGNOSIS — R600 Localized edema: Secondary | ICD-10-CM

## 2019-05-17 DIAGNOSIS — N19 Unspecified kidney failure: Secondary | ICD-10-CM

## 2019-05-17 DIAGNOSIS — N185 Chronic kidney disease, stage 5: Secondary | ICD-10-CM

## 2019-05-17 LAB — RENAL FUNCTION PANEL
Albumin: 1.7 g/dL — ABNORMAL LOW (ref 3.5–5.0)
Anion gap: 13 (ref 5–15)
BUN: 97 mg/dL — ABNORMAL HIGH (ref 6–20)
CO2: 18 mmol/L — ABNORMAL LOW (ref 22–32)
Calcium: 6.6 mg/dL — ABNORMAL LOW (ref 8.9–10.3)
Chloride: 106 mmol/L (ref 98–111)
Creatinine, Ser: 11.19 mg/dL — ABNORMAL HIGH (ref 0.61–1.24)
GFR calc Af Amer: 5 mL/min — ABNORMAL LOW (ref >60)
GFR calc non Af Amer: 5 mL/min — ABNORMAL LOW (ref >60)
Glucose, Bld: 110 mg/dL — ABNORMAL HIGH (ref 70–99)
Phosphorus: 8.1 mg/dL — ABNORMAL HIGH (ref 2.5–4.6)
Potassium: 4.2 mmol/L (ref 3.5–5.1)
Sodium: 137 mmol/L (ref 135–145)

## 2019-05-17 LAB — CBC
HCT: 27.8 % — ABNORMAL LOW (ref 39.0–52.0)
Hemoglobin: 8.9 g/dL — ABNORMAL LOW (ref 13.0–17.0)
MCH: 23.5 pg — ABNORMAL LOW (ref 26.0–34.0)
MCHC: 32 g/dL (ref 30.0–36.0)
MCV: 73.4 fL — ABNORMAL LOW (ref 80.0–100.0)
Platelets: 215 10*3/uL (ref 150–400)
RBC: 3.79 MIL/uL — ABNORMAL LOW (ref 4.22–5.81)
RDW: 18.6 % — ABNORMAL HIGH (ref 11.5–15.5)
WBC: 4.4 10*3/uL (ref 4.0–10.5)
nRBC: 0 % (ref 0.0–0.2)

## 2019-05-17 LAB — GLUCOSE, CAPILLARY
Glucose-Capillary: 102 mg/dL — ABNORMAL HIGH (ref 70–99)
Glucose-Capillary: 282 mg/dL — ABNORMAL HIGH (ref 70–99)
Glucose-Capillary: 395 mg/dL — ABNORMAL HIGH (ref 70–99)
Glucose-Capillary: 387 mg/dL — ABNORMAL HIGH (ref 70–99)
Glucose-Capillary: 355 mg/dL — ABNORMAL HIGH (ref 70–99)

## 2019-05-17 LAB — HEPATITIS B SURFACE ANTIBODY,QUALITATIVE: Hep B S Ab: NONREACTIVE

## 2019-05-17 LAB — HEPATITIS B CORE ANTIBODY, TOTAL: Hep B Core Total Ab: NONREACTIVE

## 2019-05-17 MED ORDER — HYDROCODONE-ACETAMINOPHEN 5-325 MG PO TABS
1.0000 | ORAL_TABLET | Freq: Four times a day (QID) | ORAL | Status: DC | PRN
  Administered 2019-05-17 – 2019-05-19 (×4): 1 via ORAL
  Filled 2019-05-17 (×4): qty 1

## 2019-05-17 MED ORDER — HEPARIN SODIUM (PORCINE) 1000 UNIT/ML IJ SOLN
INTRAMUSCULAR | Status: AC
  Administered 2019-05-17: 3200 [IU] via INTRAVENOUS
  Filled 2019-05-17: qty 1

## 2019-05-17 MED ORDER — ACETAMINOPHEN 325 MG PO TABS
ORAL_TABLET | ORAL | Status: AC
  Filled 2019-05-17: qty 2

## 2019-05-17 MED ORDER — INSULIN GLARGINE 100 UNIT/ML ~~LOC~~ SOLN
10.0000 [IU] | Freq: Every day | SUBCUTANEOUS | Status: DC
  Administered 2019-05-17 – 2019-05-18 (×2): 10 [IU] via SUBCUTANEOUS
  Filled 2019-05-17 (×2): qty 0.1

## 2019-05-17 MED ORDER — HEPARIN SODIUM (PORCINE) 1000 UNIT/ML IJ SOLN
INTRAMUSCULAR | Status: AC
  Filled 2019-05-17: qty 4

## 2019-05-17 MED ORDER — MUPIROCIN CALCIUM 2 % EX CREA
TOPICAL_CREAM | Freq: Every day | CUTANEOUS | Status: DC
  Administered 2019-05-17 – 2019-05-20 (×4): via TOPICAL
  Filled 2019-05-17: qty 15

## 2019-05-17 MED ORDER — INSULIN ASPART 100 UNIT/ML ~~LOC~~ SOLN
0.0000 [IU] | Freq: Every day | SUBCUTANEOUS | Status: DC
  Administered 2019-05-18: 3 [IU] via SUBCUTANEOUS
  Administered 2019-05-19 – 2019-05-20 (×2): 5 [IU] via SUBCUTANEOUS

## 2019-05-17 MED ORDER — HEPATITIS B VAC RECOMBINANT 10 MCG/ML IJ SUSP
1.0000 mL | Freq: Once | INTRAMUSCULAR | Status: AC
  Administered 2019-05-18: 10 ug via INTRAMUSCULAR
  Filled 2019-05-17 (×2): qty 1

## 2019-05-17 MED ORDER — INSULIN ASPART 100 UNIT/ML ~~LOC~~ SOLN
0.0000 [IU] | Freq: Three times a day (TID) | SUBCUTANEOUS | Status: DC
  Administered 2019-05-18: 7 [IU] via SUBCUTANEOUS
  Administered 2019-05-18: 2 [IU] via SUBCUTANEOUS
  Administered 2019-05-19: 3 [IU] via SUBCUTANEOUS
  Administered 2019-05-20: 5 [IU] via SUBCUTANEOUS

## 2019-05-17 NOTE — Progress Notes (Addendum)
Pt. CBG at 2042 was 387. On call provider, FMTS paged. Provider placed orders for 10 units of Lantus and ACHS sliding scale insulin. Patient refused sliding scale insulin (novolog). Per patient, his sugar drops too fast and does not want to take both Lantus and novolog at bedtime. CBG rechecked at 2155 was 355. Sugar appears to be trending down. Patient educated. On call provider made aware. Will continue to monitor.

## 2019-05-17 NOTE — Progress Notes (Signed)
Family Medicine Teaching Service Daily Progress Note Intern Pager: [phone]  Patient name: Jeremiah [name] Medical record number: [id number] Date of birth: 03-May-1966 Age: 53 y.o. Gender: male  Primary Care Provider: Gladys Damme, MD Consultants: Nephrology, IR Code Status: Full  Pt Overview and Major Events to Date:  05/16/2019: admitted for volume overload, TDC placed 05/17/2019: HD start  Assessment and Plan: Jeremiah [name] is a 53 y.o. male presenting with ongoing bilateral lower extremity and penile edema. PMH is significant for ESRD, HTN, T2DM, HLD, anemia   ESRD   Edema  TDC placed successfully yesterday. Currently undergoing new start HD today. Awaiting community HD chair for disposition. Wt 183lbs today. Total output yesterday 4L - Nephrology consulted, appreciate recommendations  - AM CBC and renal function panel - Daily weights - Strict I's and O's - Consider repeat ECHO  Uncontrolled HTN  Blood pressure 163/88 today.  Home medications include amlodipine 10 mg daily, hydralazine 25 mg 3 times daily, coreg 12.5 mg twice daily.   -Continue home hydralazine and coreg -Hold home lasix and amlodipine pending HD, will add back after HD -Monitor blood pressure  Uncontrolled T2DM  A1c 10.8. BG ranged from 443 to 45 yesterday while NPO all day for [facility]. Received 1 amp D50 when BG 45, appropriately responded. Will continue to watch glucose today to titrate correct level of long acting insulin. -Lantus 5 units at bedtime -Sliding scale insulin -Monitor CBGs  HLD Last lipid panel on 01/20/2018: Cholesterol 227, triglycerides 150, HDL 36, LDL 163.  Home medications include Lipitor 40 mg and 81 mg aspirin daily. -Continue home medication  Anemia  Etiology likely multifactorial; anemia of chronic disease as well as iron deficiency anemia.  Hemoglobin on admission 10.0.  Baseline appears to be ~8.5-9.  Home medications include Fe sulfate 325 mg daily. -CBC in  AM -Continue iron supplement  Diabetic Neuropathy   L foot wounds possible PVD Patient reports ongoing bilateral feet numbness and tingling. 3rd-4th toes on L foot appear dry gangrenous, x-ray had no significant findings. See images below. -Wound RN consult, follow up outpatient as well -Obtain ABIs today and consider VVS referral for re-vascularization  Chronic Pain   Osteoarthritis  Prescribed Percocet 10 mg every 6 hours as needed. -Hold home Tuttle Maintenance Pt had negative Hep B surface ab, negative Hep B core ab. -Give Hep B vaccine  FEN/GI: renal/carb modified with fluid restriction Prophylaxis: Heparin   Disposition: Admit to medical telemetry pending new start HD  Subjective:  Patient resting in bed eating a sandwich during HD. Awaiting community HD chair.  Objective: Temp:  [97.9 F (36.6 C)-98.9 F (37.2 C)] 98.3 F (36.8 C) (11/20 0450) Pulse Rate:  [72-87] 81 (11/20 0450) Resp:  [10-18] 18 (11/20 0450) BP: (138-202)/(74-101) 163/88 (11/20 0450) SpO2:  [98 %-100 %] 100 % (11/20 0450) Weight:  [83.1 kg-83.5 kg] 83.1 kg (11/20 0128) Physical Exam: General: pleasant middle-aged man, WNWD, NAD Cardiovascular: RRR, no m/r/g Respiratory: CTAB, no increased WOB, no rales/rhonchi Abdomen: soft, NT, ND Extremities: L foot with erosions, crust, no discharge on digits 2-4, no erythema, edema. No DP palpated on either foot.      Laboratory: Recent Labs  Lab 05/13/19 0621 05/15/19 2256 05/16/19 0437  WBC  --  5.6 4.9  HGB 11.2* 10.0* 9.4*  HCT 33.0* 31.2* 29.1*  PLT  --  259 233   Recent Labs  Lab 05/13/19 0621 05/15/19 2256 05/16/19 0437  NA 134* 136 134*  K  4.2 5.1 5.0  CL 103 106 104  CO2  --  17* 14*  BUN 107* 114* 111*  CREATININE 13.30* 12.56* 12.44*  CALCIUM  --  7.4* 7.2*  GLUCOSE 96 197* 353*    Imaging/Diagnostic Tests: Dg Chest 2 View  Result Date: 05/15/2019 CLINICAL DATA:  Bilateral lower extremity swelling  EXAM: CHEST - 2 VIEW COMPARISON:  01/19/2018 FINDINGS: The heart size and mediastinal contours are within normal limits. Both lungs are clear. The visualized skeletal structures are unremarkable. IMPRESSION: No active cardiopulmonary disease. Electronically Signed   By: Rolm Baptise M.D.   On: 05/15/2019 23:15   Ir Fluoro Guide Cv Line Right  Result Date: 05/16/2019 INDICATION: 53 year old with end-stage renal disease and needs hemodialysis. EXAM: FLUOROSCOPIC AND ULTRASOUND GUIDED PLACEMENT OF A TUNNELED DIALYSIS CATHETER Physician: Stephan Minister. Anselm Pancoast, MD MEDICATIONS: Ancef 2 g; The antibiotic was administered within an appropriate time interval prior to skin puncture. ANESTHESIA/SEDATION: Versed 1.0 mg IV; Fentanyl 50 mcg IV; Moderate Sedation Time:  16 minutes The patient was continuously monitored during the procedure by the interventional radiology nurse under my direct supervision. FLUOROSCOPY TIME:  Fluoroscopy Time: 24 seconds, 1 mGy COMPLICATIONS: None immediate. PROCEDURE: The procedure was explained to the patient. The risks and benefits of the procedure were discussed and the patient's questions were addressed. Informed consent was obtained from the patient. The patient was placed supine on the interventional table. Ultrasound confirmed a patent right internal jugular vein. Ultrasound images were obtained for documentation. The right neck and chest was prepped and draped in a sterile fashion. The right neck was anesthetized with 1% lidocaine. Maximal barrier sterile technique was utilized including caps, mask, sterile gowns, sterile gloves, sterile drape, hand hygiene and skin antiseptic. A small incision was made with #11 blade scalpel. A 21 gauge needle directed into the right internal jugular vein with ultrasound guidance. A micropuncture dilator set was placed. A 19 cm tip to cuff Palindrome catheter was selected. The skin below the right clavicle was anesthetized and a small incision was made with an  #11 blade scalpel. A subcutaneous tunnel was formed to the vein dermatotomy site. The catheter was brought through the tunnel. The vein dermatotomy site was dilated to accommodate a peel-away sheath. The catheter was placed through the peel-away sheath and directed into the central venous structures. The tip of the catheter was placed at the SVC and right atrium junction with fluoroscopy. Fluoroscopic images were obtained for documentation. Both lumens were found to aspirate and flush well. The proper amount of heparin was flushed in both lumens. The vein dermatotomy site was closed using a single layer of absorbable suture and Dermabond. The catheter was secured to the skin using Prolene suture. IMPRESSION: Successful placement of a right jugular tunneled dialysis catheter using ultrasound and fluoroscopic guidance. Electronically Signed   By: Markus Daft M.D.   On: 05/16/2019 19:50   Ir US Guide Vasc Access Right  Result Date: 05/16/2019 INDICATION: 53 year old with end-stage renal disease and needs hemodialysis. EXAM: FLUOROSCOPIC AND ULTRASOUND GUIDED PLACEMENT OF A TUNNELED DIALYSIS CATHETER Physician: Stephan Minister. Anselm Pancoast, MD MEDICATIONS: Ancef 2 g; The antibiotic was administered within an appropriate time interval prior to skin puncture. ANESTHESIA/SEDATION: Versed 1.0 mg IV; Fentanyl 50 mcg IV; Moderate Sedation Time:  16 minutes The patient was continuously monitored during the procedure by the interventional radiology nurse under my direct supervision. FLUOROSCOPY TIME:  Fluoroscopy Time: 24 seconds, 1 mGy COMPLICATIONS: None immediate. PROCEDURE: The procedure was explained to the  patient. The risks and benefits of the procedure were discussed and the patient's questions were addressed. Informed consent was obtained from the patient. The patient was placed supine on the interventional table. Ultrasound confirmed a patent right internal jugular vein. Ultrasound images were obtained for documentation. The  right neck and chest was prepped and draped in a sterile fashion. The right neck was anesthetized with 1% lidocaine. Maximal barrier sterile technique was utilized including caps, mask, sterile gowns, sterile gloves, sterile drape, hand hygiene and skin antiseptic. A small incision was made with #11 blade scalpel. A 21 gauge needle directed into the right internal jugular vein with ultrasound guidance. A micropuncture dilator set was placed. A 19 cm tip to cuff Palindrome catheter was selected. The skin below the right clavicle was anesthetized and a small incision was made with an #11 blade scalpel. A subcutaneous tunnel was formed to the vein dermatotomy site. The catheter was brought through the tunnel. The vein dermatotomy site was dilated to accommodate a peel-away sheath. The catheter was placed through the peel-away sheath and directed into the central venous structures. The tip of the catheter was placed at the SVC and right atrium junction with fluoroscopy. Fluoroscopic images were obtained for documentation. Both lumens were found to aspirate and flush well. The proper amount of heparin was flushed in both lumens. The vein dermatotomy site was closed using a single layer of absorbable suture and Dermabond. The catheter was secured to the skin using Prolene suture. IMPRESSION: Successful placement of a right jugular tunneled dialysis catheter using ultrasound and fluoroscopic guidance. Electronically Signed   By: Markus Daft M.D.   On: 05/16/2019 19:50   Dg Foot Complete Left  Result Date: 05/16/2019 CLINICAL DATA:  Left foot wound. EXAM: LEFT FOOT - COMPLETE 3+ VIEW COMPARISON:  June 02, 2016. FINDINGS: There is no evidence of fracture or dislocation. There is no evidence of arthropathy or other focal bone abnormality. Soft tissues are unremarkable. IMPRESSION: Negative. Electronically Signed   By: Marijo Conception M.D.   On: 05/16/2019 13:48    Gladys Damme, MD 05/17/2019, 6:10 AM PGY-1,  [place] Intern pager: [phone], text pages welcome

## 2019-05-17 NOTE — Procedures (Signed)
Patient seen and examined on Hemodialysis. BP (!) 170/79   Pulse 87   Temp 98.2 F (36.8 C) (Oral)   Resp 18   Ht 5' 11.5" (1.816 m)   Wt 83.1 kg   SpO2 100%   BMI 25.19 kg/m   QB 300 mL/ min via R IJ TDC, UF goal 3L  Tolerating treatment without complaints at this time. LE swelling improved   Madelon Lips MD [place] pgr [phone] 12:06 PM

## 2019-05-17 NOTE — Progress Notes (Signed)
Renal Navigator met with patient to complete referral for OP HD treatment. Referral submitted to [facility]. Patient states he lives at home with his wife. He drives, but states understanding of recommendation to have wife drive him to treatment for first appointment. He prefers first shift and asked to be referred to [facility], as he is more familiar with this area than the [facility] area, which appears to be about 1-2 miles closer to his home address. Patient was extremely appreciative. Renal Navigator will follow closely.  Alphonzo Cruise, [place] Renal Navigator [phone]

## 2019-05-17 NOTE — Progress Notes (Signed)
Printed education on renal diet and after care for HD access treatment. Reviewed with pt, answered all questions.   Paulla Fore, RN, BSN.

## 2019-05-17 NOTE — Consult Note (Addendum)
[place] Nurse Consult Note: Reason for Consult: Consult requested for left toes 2-4.  Pt states he burned them a few weeks ago with hot water while in the shower, since he has neuropathy and is unaware of the temperature.  He has been treating the sites with Vaseline at home. Wound type: !00% tightly adhered dry scabs over anterior toes 2-4 Dressing procedure/placement/frequency: Removed some of the dry nonviable skin using moist gauze; revealing pink dry full thickness wounds.  2nd toe .3X.3X.1cm, 3rd toe .8X.3X.1cm, 4th toe .5X.5X.1cm.  No odor or drainage or fluctuance. Plan: Topical treatment orders provided for bedside nurses to apply daily to assist with removal of nonviable tissue and promote healing as follows: Apply Bactroban to left toe wounds Q day, then cover with foam dressing.  (Change foam dressing Q 3 days or PRN soiling.) Please re-consult if further assistance is needed.  Thank-you,  Julien Girt MSN, [place], [place], [place], [place]

## 2019-05-17 NOTE — Progress Notes (Signed)
[facility] Progress Note    Assessment/ Plan:   1.  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  Patient now has declining GFR associated with mild hyperkalemia, acidosis and overt uremic symptoms.  He is status post right brachial cephalic AV fistula creation by Dr. Oneida Alar on 05/13/2019.  HD #1 11/19, HD #2 11/20, next planned HD 11/21.  CLIP in process.    2 Hypertension/fluid overload: Resume home medication.  BP expected to improve after HD.    3 Anemia due to CKD: Hemoglobin was 10 on admission.  Iron saturation 38% on 05/03/2019.  Started ESA  4 Metabolic bone disease: High phosphorus level.  Start PhosLo.  Will improve with HD.    Subjective:    Seen and examined.  HD #2 today.  Feeling well.     Objective:   BP (!) 170/79   Pulse 87   Temp 98.2 F (36.8 C) (Oral)   Resp 18   Ht 5' 11.5" (1.816 m)   Wt 83.1 kg   SpO2 100%   BMI 25.19 kg/m   Intake/Output Summary (Last 24 hours) at 05/17/2019 1204 Last data filed at 05/17/2019 0600 Gross per 24 hour  Intake 680 ml  Output 3061 ml  Net -2381 ml   Weight change: 1.907 kg  Physical Exam: Gen: NAD, sitting in bed CVS: RRR no m/r/g Resp: some bilateral expiratory rhonchi Abd: soft, nontender NABS Ext: 2+ LE edema ACCESS: R AVF + T/B, R IJ TDC   Imaging: Dg Chest 2 View  Result Date: 05/15/2019 CLINICAL DATA:  Bilateral lower extremity swelling EXAM: CHEST - 2 VIEW COMPARISON:  01/19/2018 FINDINGS: The heart size and mediastinal contours are within normal limits. Both lungs are clear. The visualized skeletal structures are unremarkable. IMPRESSION: No active cardiopulmonary disease. Electronically Signed   By: Rolm Baptise M.D.   On: 05/15/2019 23:15   Ir Fluoro Guide Cv Line Right  Result Date: 05/16/2019 INDICATION: 53 year old with end-stage renal disease and needs hemodialysis. EXAM: FLUOROSCOPIC AND ULTRASOUND  GUIDED PLACEMENT OF A TUNNELED DIALYSIS CATHETER Physician: Stephan Minister. Anselm Pancoast, MD MEDICATIONS: Ancef 2 g; The antibiotic was administered within an appropriate time interval prior to skin puncture. ANESTHESIA/SEDATION: Versed 1.0 mg IV; Fentanyl 50 mcg IV; Moderate Sedation Time:  16 minutes The patient was continuously monitored during the procedure by the interventional radiology nurse under my direct supervision. FLUOROSCOPY TIME:  Fluoroscopy Time: 24 seconds, 1 mGy COMPLICATIONS: None immediate. PROCEDURE: The procedure was explained to the patient. The risks and benefits of the procedure were discussed and the patient's questions were addressed. Informed consent was obtained from the patient. The patient was placed supine on the interventional table. Ultrasound confirmed a patent right internal jugular vein. Ultrasound images were obtained for documentation. The right neck and chest was prepped and draped in a sterile fashion. The right neck was anesthetized with 1% lidocaine. Maximal barrier sterile technique was utilized including caps, mask, sterile gowns, sterile gloves, sterile drape, hand hygiene and skin antiseptic. A small incision was made with #11 blade scalpel. A 21 gauge needle directed into the right internal jugular vein with ultrasound guidance. A micropuncture dilator set was placed. A 19 cm tip to cuff Palindrome catheter was selected. The skin below the right clavicle was anesthetized and a small incision was made with an #11 blade scalpel. A subcutaneous tunnel was formed to the vein dermatotomy site. The catheter was brought through  the tunnel. The vein dermatotomy site was dilated to accommodate a peel-away sheath. The catheter was placed through the peel-away sheath and directed into the central venous structures. The tip of the catheter was placed at the SVC and right atrium junction with fluoroscopy. Fluoroscopic images were obtained for documentation. Both lumens were found to aspirate and  flush well. The proper amount of heparin was flushed in both lumens. The vein dermatotomy site was closed using a single layer of absorbable suture and Dermabond. The catheter was secured to the skin using Prolene suture. IMPRESSION: Successful placement of a right jugular tunneled dialysis catheter using ultrasound and fluoroscopic guidance. Electronically Signed   By: Markus Daft M.D.   On: 05/16/2019 19:50   Ir US Guide Vasc Access Right  Result Date: 05/16/2019 INDICATION: 53 year old with end-stage renal disease and needs hemodialysis. EXAM: FLUOROSCOPIC AND ULTRASOUND GUIDED PLACEMENT OF A TUNNELED DIALYSIS CATHETER Physician: Stephan Minister. Anselm Pancoast, MD MEDICATIONS: Ancef 2 g; The antibiotic was administered within an appropriate time interval prior to skin puncture. ANESTHESIA/SEDATION: Versed 1.0 mg IV; Fentanyl 50 mcg IV; Moderate Sedation Time:  16 minutes The patient was continuously monitored during the procedure by the interventional radiology nurse under my direct supervision. FLUOROSCOPY TIME:  Fluoroscopy Time: 24 seconds, 1 mGy COMPLICATIONS: None immediate. PROCEDURE: The procedure was explained to the patient. The risks and benefits of the procedure were discussed and the patient's questions were addressed. Informed consent was obtained from the patient. The patient was placed supine on the interventional table. Ultrasound confirmed a patent right internal jugular vein. Ultrasound images were obtained for documentation. The right neck and chest was prepped and draped in a sterile fashion. The right neck was anesthetized with 1% lidocaine. Maximal barrier sterile technique was utilized including caps, mask, sterile gowns, sterile gloves, sterile drape, hand hygiene and skin antiseptic. A small incision was made with #11 blade scalpel. A 21 gauge needle directed into the right internal jugular vein with ultrasound guidance. A micropuncture dilator set was placed. A 19 cm tip to cuff Palindrome catheter  was selected. The skin below the right clavicle was anesthetized and a small incision was made with an #11 blade scalpel. A subcutaneous tunnel was formed to the vein dermatotomy site. The catheter was brought through the tunnel. The vein dermatotomy site was dilated to accommodate a peel-away sheath. The catheter was placed through the peel-away sheath and directed into the central venous structures. The tip of the catheter was placed at the SVC and right atrium junction with fluoroscopy. Fluoroscopic images were obtained for documentation. Both lumens were found to aspirate and flush well. The proper amount of heparin was flushed in both lumens. The vein dermatotomy site was closed using a single layer of absorbable suture and Dermabond. The catheter was secured to the skin using Prolene suture. IMPRESSION: Successful placement of a right jugular tunneled dialysis catheter using ultrasound and fluoroscopic guidance. Electronically Signed   By: Markus Daft M.D.   On: 05/16/2019 19:50   Dg Foot Complete Left  Result Date: 05/16/2019 CLINICAL DATA:  Left foot wound. EXAM: LEFT FOOT - COMPLETE 3+ VIEW COMPARISON:  June 02, 2016. FINDINGS: There is no evidence of fracture or dislocation. There is no evidence of arthropathy or other focal bone abnormality. Soft tissues are unremarkable. IMPRESSION: Negative. Electronically Signed   By: Marijo Conception M.D.   On: 05/16/2019 13:48    Labs: [employer] Recent Labs  Lab 05/13/19 [id number] 05/15/19 2256 05/16/19 0437 05/17/19 0602  NA 134* 136 134* 137  K 4.2 5.1 5.0 4.2  CL 103 106 104 106  CO2  --  17* 14* 18*  GLUCOSE 96 197* 353* 110*  BUN 107* 114* 111* 97*  CREATININE 13.30* 12.56* 12.44* 11.19*  CALCIUM  --  7.4* 7.2* 6.6*  PHOS  --   --  9.5* 8.1*   CBC Recent Labs  Lab 05/13/19 0621 05/15/19 2256 05/16/19 0437 05/17/19 0602  WBC  --  5.6 4.9 4.4  HGB 11.2* 10.0* 9.4* 8.9*  HCT 33.0* 31.2* 29.1* 27.8*  MCV  --  73.9* 72.6* 73.4*  PLT  --   259 233 215    Medications:    . acetaminophen      . aspirin EC  81 mg Oral Daily  . atorvastatin  40 mg Oral Daily  . calcitRIOL  1 mcg Oral Daily  . calcium acetate  1,334 mg Oral TID WC  . carvedilol  12.5 mg Oral BID WC  . Chlorhexidine Gluconate Cloth  6 each Topical Q0600  . darbepoetin (ARANESP) injection - DIALYSIS  60 mcg Intravenous Q Thu-HD  . ferrous sulfate  325 mg Oral BID WC  . heparin      . heparin  5,000 Units Subcutaneous Q8H  . hepatitis b vaccine for adults  1 mL Intramuscular Once  . hydrALAZINE  25 mg Oral Q8H  . insulin aspart  0-9 Units Subcutaneous TID WC  . insulin glargine  5 Units Subcutaneous Q2200  . mupirocin cream   Topical Daily  . sodium chloride flush  3 mL Intravenous Once      Madelon Lips, MD 05/17/2019, 12:04 PM

## 2019-05-18 ENCOUNTER — Encounter (HOSPITAL_COMMUNITY): Payer: Medicaid Other

## 2019-05-18 DIAGNOSIS — I159 Secondary hypertension, unspecified: Secondary | ICD-10-CM

## 2019-05-18 LAB — BASIC METABOLIC PANEL
Anion gap: 10 (ref 5–15)
BUN: 67 mg/dL — ABNORMAL HIGH (ref 6–20)
CO2: 21 mmol/L — ABNORMAL LOW (ref 22–32)
Calcium: 7.3 mg/dL — ABNORMAL LOW (ref 8.9–10.3)
Chloride: 106 mmol/L (ref 98–111)
Creatinine, Ser: 8.33 mg/dL — ABNORMAL HIGH (ref 0.61–1.24)
GFR calc Af Amer: 8 mL/min — ABNORMAL LOW (ref >60)
GFR calc non Af Amer: 7 mL/min — ABNORMAL LOW (ref >60)
Glucose, Bld: 118 mg/dL — ABNORMAL HIGH (ref 70–99)
Potassium: 3.8 mmol/L (ref 3.5–5.1)
Sodium: 137 mmol/L (ref 135–145)

## 2019-05-18 LAB — CBC
HCT: 26.9 % — ABNORMAL LOW (ref 39.0–52.0)
Hemoglobin: 8.5 g/dL — ABNORMAL LOW (ref 13.0–17.0)
MCH: 23.5 pg — ABNORMAL LOW (ref 26.0–34.0)
MCHC: 31.6 g/dL (ref 30.0–36.0)
MCV: 74.3 fL — ABNORMAL LOW (ref 80.0–100.0)
Platelets: 196 10*3/uL (ref 150–400)
RBC: 3.62 MIL/uL — ABNORMAL LOW (ref 4.22–5.81)
RDW: 18.2 % — ABNORMAL HIGH (ref 11.5–15.5)
WBC: 5.5 10*3/uL (ref 4.0–10.5)
nRBC: 0 % (ref 0.0–0.2)

## 2019-05-18 LAB — GLUCOSE, CAPILLARY
Glucose-Capillary: 83 mg/dL (ref 70–99)
Glucose-Capillary: 322 mg/dL — ABNORMAL HIGH (ref 70–99)
Glucose-Capillary: 196 mg/dL — ABNORMAL HIGH (ref 70–99)
Glucose-Capillary: 296 mg/dL — ABNORMAL HIGH (ref 70–99)

## 2019-05-18 MED ORDER — PRO-STAT SUGAR FREE PO LIQD
30.0000 mL | Freq: Two times a day (BID) | ORAL | Status: DC
  Administered 2019-05-18 – 2019-05-20 (×5): 30 mL via ORAL
  Filled 2019-05-18 (×5): qty 30

## 2019-05-18 MED ORDER — ONDANSETRON HCL 4 MG/2ML IJ SOLN: 4.0000 mg | Freq: Three times a day (TID) | INTRAMUSCULAR | Status: DC | PRN

## 2019-05-18 MED ORDER — AMLODIPINE BESYLATE 10 MG PO TABS
10.0000 mg | ORAL_TABLET | Freq: Every day | ORAL | Status: DC
  Administered 2019-05-18 – 2019-05-19 (×2): 10 mg via ORAL
  Filled 2019-05-18 (×2): qty 1

## 2019-05-18 MED ORDER — HEPARIN SODIUM (PORCINE) 1000 UNIT/ML IJ SOLN
INTRAMUSCULAR | Status: AC
  Filled 2019-05-18: qty 3

## 2019-05-18 MED ORDER — MUPIROCIN 2 % EX OINT
TOPICAL_OINTMENT | CUTANEOUS | Status: AC
  Administered 2019-05-18: 10:00:00
  Filled 2019-05-18: qty 22

## 2019-05-18 NOTE — Progress Notes (Signed)
Family Medicine Teaching Service Daily Progress Note Intern Pager: [phone]  Patient name: Jeremiah [name] Medical record number: [id number] Date of birth: 1966-03-28 Age: 53 y.o. Gender: male  Primary Care Provider: Gladys Damme, MD Consultants: Nephrology, IR Code Status: Full  Pt Overview and Major Events to Date:  05/16/2019: admitted for volume overload, TDC placed 05/17/2019: HD start  Assessment and Plan: Jeremiah [name] is a 53 y.o. male presenting with ongoing bilateral lower extremity and penile edema. PMH is significant for ESRD, HTN, T2DM, HLD, anemia   ESRD  Edema  Tolerating new start HD. Awaiting community HD chair for disposition. Wt 179 lbs today. Total output yesterday 2L, cumulative 6L removed in 3 days. Edema not present today. - Nephrology consulted, appreciate recommendations  - AM CBC and renal function panel - Daily weights - Strict I's and O's - Consider repeat ECHO  Nausea Pt had one episode of nausea and vomiting witnessed by this provider this morning. Given 1 dose of zofran IV 4mg . Feeling much better now. Will continue to monitor. - Zofran 4mg  IV q8h PRN  Uncontrolled HTN  Blood pressure 172/80 today.  Home medications include amlodipine 10 mg daily, hydralazine 25 mg 3 times daily, coreg 12.5 mg twice daily. Will restart amlodipine home dose today, if still elevated tomorrow, will consider adding ARB. -Continue home hydralazine and coreg - Restart amlodipine 10 mg daily -Monitor blood pressure  Uncontrolled T2DM  A1c 10.8. BG ranged from 85-395 yesterday, 2days ago he had only gotten 5U lantus due to NPO, last night he received 10U lantus. Will monitor BGs today and likely increase Lantus to 12U. Received 14U aspart yesterday. -Lantus 10 units at bedtime -Sliding scale insulin -Monitor CBGs  HLD Last lipid panel on 01/20/2018: Cholesterol 227, triglycerides 150, HDL 36, LDL 163.  Home medications include Lipitor 40 mg and 81 mg  aspirin daily. -Continue home medication  Anemia  Etiology likely multifactorial; anemia of chronic disease as well as iron deficiency anemia.  Hemoglobin 8.5.  Baseline appears to be ~8.5-9.  Home medications include Fe sulfate 325 mg daily. -CBC in AM -Continue iron supplement  Diabetic Neuropathy  L foot wounds possible PVD Patient reports ongoing bilateral feet numbness and tingling. 3rd-4th toes on L foot appear dry gangrenous, x-ray had no significant findings. Wound nurse saw pt yesterday and treated appropriately, will continue to monitor and follow until healed. -Wound RN consult, follow up outpatient as well -Obtain ABIs and consider VVS referral for re-vascularization  Chronic Pain  Osteoarthritis  Prescribed Percocet 10 mg every 6 hours as needed. -Hold home Percocet  FEN/GI: renal/carb modified with fluid restriction Prophylaxis: Heparin   Disposition: Admit to medical telemetry pending new start HD  Subjective:  Awaiting community HD chair. Pt had one episode of nausea and vomiting witnessed by this provider this morning, pt is feeling much better and resting comfortably in bed s/p zofran x1.   Objective: Temp:  [98.1 F (36.7 C)-98.9 F (37.2 C)] 98.9 F (37.2 C) (11/21 0357) Pulse Rate:  [76-91] 76 (11/21 0357) Resp:  [16-20] 18 (11/21 0357) BP: (145-193)/(70-94) 172/80 (11/21 0357) SpO2:  [99 %-100 %] 100 % (11/21 0357) Weight:  [81.6 kg-86.2 kg] 81.6 kg (11/21 0357) Physical Exam: General: pleasant middle-aged man, WN, WD, NAD Cardiovascular: RRR, no m/r/g Respiratory: CTAB, no increased WOB, no rales/rhonchi Abdomen: soft, NT, ND Extremities: L foot with erosions, crust, no discharge on digits 2-4, no erythema, edema. No DP palpated on either foot.   Laboratory:  Recent Labs  Lab 05/16/19 0437 05/17/19 0602 05/18/19 0528  WBC 4.9 4.4 5.5  HGB 9.4* 8.9* 8.5*  HCT 29.1* 27.8* 26.9*  PLT 233 215 196   Recent Labs  Lab 05/16/19 0437  05/17/19 0602 05/18/19 0528  NA 134* 137 137  K 5.0 4.2 3.8  CL 104 106 106  CO2 14* 18* 21*  BUN 111* 97* 67*  CREATININE 12.44* 11.19* 8.33*  CALCIUM 7.2* 6.6* 7.3*  GLUCOSE 353* 110* 118*    Imaging/Diagnostic Tests: Ir Fluoro Guide Cv Line Right  Result Date: 05/16/2019 INDICATION: 53 year old with end-stage renal disease and needs hemodialysis. EXAM: FLUOROSCOPIC AND ULTRASOUND GUIDED PLACEMENT OF A TUNNELED DIALYSIS CATHETER Physician: Stephan Minister. Anselm Pancoast, MD MEDICATIONS: Ancef 2 g; The antibiotic was administered within an appropriate time interval prior to skin puncture. ANESTHESIA/SEDATION: Versed 1.0 mg IV; Fentanyl 50 mcg IV; Moderate Sedation Time:  16 minutes The patient was continuously monitored during the procedure by the interventional radiology nurse under my direct supervision. FLUOROSCOPY TIME:  Fluoroscopy Time: 24 seconds, 1 mGy COMPLICATIONS: None immediate. PROCEDURE: The procedure was explained to the patient. The risks and benefits of the procedure were discussed and the patient's questions were addressed. Informed consent was obtained from the patient. The patient was placed supine on the interventional table. Ultrasound confirmed a patent right internal jugular vein. Ultrasound images were obtained for documentation. The right neck and chest was prepped and draped in a sterile fashion. The right neck was anesthetized with 1% lidocaine. Maximal barrier sterile technique was utilized including caps, mask, sterile gowns, sterile gloves, sterile drape, hand hygiene and skin antiseptic. A small incision was made with #11 blade scalpel. A 21 gauge needle directed into the right internal jugular vein with ultrasound guidance. A micropuncture dilator set was placed. A 19 cm tip to cuff Palindrome catheter was selected. The skin below the right clavicle was anesthetized and a small incision was made with an #11 blade scalpel. A subcutaneous tunnel was formed to the vein dermatotomy site.  The catheter was brought through the tunnel. The vein dermatotomy site was dilated to accommodate a peel-away sheath. The catheter was placed through the peel-away sheath and directed into the central venous structures. The tip of the catheter was placed at the SVC and right atrium junction with fluoroscopy. Fluoroscopic images were obtained for documentation. Both lumens were found to aspirate and flush well. The proper amount of heparin was flushed in both lumens. The vein dermatotomy site was closed using a single layer of absorbable suture and Dermabond. The catheter was secured to the skin using Prolene suture. IMPRESSION: Successful placement of a right jugular tunneled dialysis catheter using ultrasound and fluoroscopic guidance. Electronically Signed   By: Markus Daft M.D.   On: 05/16/2019 19:50   Ir US Guide Vasc Access Right  Result Date: 05/16/2019 INDICATION: 53 year old with end-stage renal disease and needs hemodialysis. EXAM: FLUOROSCOPIC AND ULTRASOUND GUIDED PLACEMENT OF A TUNNELED DIALYSIS CATHETER Physician: Stephan Minister. Anselm Pancoast, MD MEDICATIONS: Ancef 2 g; The antibiotic was administered within an appropriate time interval prior to skin puncture. ANESTHESIA/SEDATION: Versed 1.0 mg IV; Fentanyl 50 mcg IV; Moderate Sedation Time:  16 minutes The patient was continuously monitored during the procedure by the interventional radiology nurse under my direct supervision. FLUOROSCOPY TIME:  Fluoroscopy Time: 24 seconds, 1 mGy COMPLICATIONS: None immediate. PROCEDURE: The procedure was explained to the patient. The risks and benefits of the procedure were discussed and the patient's questions were addressed. Informed consent was obtained  from the patient. The patient was placed supine on the interventional table. Ultrasound confirmed a patent right internal jugular vein. Ultrasound images were obtained for documentation. The right neck and chest was prepped and draped in a sterile fashion. The right neck was  anesthetized with 1% lidocaine. Maximal barrier sterile technique was utilized including caps, mask, sterile gowns, sterile gloves, sterile drape, hand hygiene and skin antiseptic. A small incision was made with #11 blade scalpel. A 21 gauge needle directed into the right internal jugular vein with ultrasound guidance. A micropuncture dilator set was placed. A 19 cm tip to cuff Palindrome catheter was selected. The skin below the right clavicle was anesthetized and a small incision was made with an #11 blade scalpel. A subcutaneous tunnel was formed to the vein dermatotomy site. The catheter was brought through the tunnel. The vein dermatotomy site was dilated to accommodate a peel-away sheath. The catheter was placed through the peel-away sheath and directed into the central venous structures. The tip of the catheter was placed at the SVC and right atrium junction with fluoroscopy. Fluoroscopic images were obtained for documentation. Both lumens were found to aspirate and flush well. The proper amount of heparin was flushed in both lumens. The vein dermatotomy site was closed using a single layer of absorbable suture and Dermabond. The catheter was secured to the skin using Prolene suture. IMPRESSION: Successful placement of a right jugular tunneled dialysis catheter using ultrasound and fluoroscopic guidance. Electronically Signed   By: Markus Daft M.D.   On: 05/16/2019 19:50   Dg Foot Complete Left  Result Date: 05/16/2019 CLINICAL DATA:  Left foot wound. EXAM: LEFT FOOT - COMPLETE 3+ VIEW COMPARISON:  June 02, 2016. FINDINGS: There is no evidence of fracture or dislocation. There is no evidence of arthropathy or other focal bone abnormality. Soft tissues are unremarkable. IMPRESSION: Negative. Electronically Signed   By: Marijo Conception M.D.   On: 05/16/2019 13:48    Gladys Damme, MD 05/18/2019, 6:35 AM PGY-1, [place] Intern pager: [phone], text pages welcome

## 2019-05-18 NOTE — Progress Notes (Signed)
Family Medicine Teaching Service Daily Progress Note Intern Pager: [phone]  Patient name: Jeremiah [name] Medical record number: [id number] Date of birth: 01-24-1966 Age: 53 y.o. Gender: male  Primary Care Provider: Gladys Damme, MD Consultants: Nephrology, IR Code Status: Full  Pt Overview and Major Events to Date:  05/16/2019: admitted for volume overload, TDC placed 05/17/2019: HD start  Assessment and Plan: Jeremiah [name] is a 53 y.o. male presenting with ongoing bilateral lower extremity and penile edema. 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 Total output yesterday 3.5, net 11L since admission. Weight 179lbs>174>179. Plan for HD today. Awaiting community HD chair for disposition. No LE edema present on exam today.  - nephrology consulted, appreciate recs - daily CBC, RFP - daily weights, strict I/O's - consider repeat ECHO  Uncontrolled HTN  BP elevated overnight, 140-160/80's, 161/83 this AM. Home meds include: Amlodipine 10mg  QD, Hydralazine 25mg  TID, Coreg 12.5mg  BID. Appears to be downtrendnig with HD and BP meds. - Continue home meds - Monitor blood pressure - consider addition of ARB  Uncontrolled T2DM  A1c 10.8. BS 150-320's overnight. Received 10U Lantus and 12 units of Novolog. BS 158 this AM.  - Increase lantus to 12 units qHS - continue sSSI and qHS sliding scale  - monitor CBG's  - consider meal time coverage  HLD Last lipid panel on 01/20/2018: Cholesterol 227, triglycerides 150, HDL 36, LDL 163.  Home medications include Lipitor 40 mg and 81 mg aspirin daily. -Continue home medication  Anemia  Etiology likely multifactorial; anemia of chronic disease as well as iron deficiency anemia.  Hemoglobin stable at 8.5>8.8 this AM.  Baseline appears to be ~8.5-9.  Home medications include Fe sulfate 325 mg daily. No signs of active bleed on  exam. - daily CBC - Continue iron supplement  Diabetic Neuropathy  L foot wounds possible PVD History of neuropathy of bilateral feet. Has chronic gangrenous appearing 3rd-4th toes on left foot. Appears patient had borderline left ABI's in March 2020 at 0.96. Repeat ABI's pending. - continue wound care - wound care consulted, appreciate recs - follow up ABI's, consider VVS consult pending result   Chronic Pain  Osteoarthritis  Prescribed Percocet 10 mg every 6 hours as needed. - Hold home Percocet  FEN/GI: renal/carb modified with fluid restriction Prophylaxis: Heparin   Disposition: continue HD, awaiting CLIP placement  Subjective:  Patient brushing teeth upon entrance this AM. Denies any concerns or complaints. Denies any nausea this AM. Awaiting community HD chair.   Objective: Temp:  [98.3 F (36.8 C)-99.2 F (37.3 C)] 99.2 F (37.3 C) (11/22 0517) Pulse Rate:  [73-102] 74 (11/22 0517) Resp:  [16-20] 16 (11/22 0517) BP: (140-186)/(70-90) 161/83 (11/22 0517) SpO2:  [99 %-100 %] 100 % (11/22 0517) Weight:  [79 kg-81.6 kg] 81.2 kg (11/22 0517) Physical Exam: General: pleasant older male,  sitting up comfortably in couch, in no acute distress with non-toxic appearance CV: regular rate and rhythm without murmurs, rubs, or gallops, no lower extremity edema Lungs: clear to auscultation bilaterally with normal work of breathing Skin: warm, dry Extremities: warm and well perfused MSK: gait normal  Laboratory: Recent Labs  Lab 05/17/19 0602 05/18/19 0528 05/19/19 0452  WBC 4.4 5.5 5.2  HGB 8.9* 8.5* 8.8*  HCT 27.8* 26.9* 28.8*  PLT 215 196 214   Recent Labs  Lab 05/17/19 0602 05/18/19 0528 05/19/19 3419  NA 137 137 137  K 4.2 3.8 4.3  CL 106 106 104  CO2 18* 21* 25  BUN 97* 67* 42*  CREATININE 11.19* 8.33* 5.78*  CALCIUM 6.6* 7.3* 7.8*  GLUCOSE 110* 118* 158*    Imaging/Diagnostic Tests: No results found.  Mina Marble [place], DO 05/19/2019, 6:11  AM PGY-2, [place] Intern pager: [phone], text pages welcome

## 2019-05-18 NOTE — Plan of Care (Signed)
  Problem: Education: Goal: Knowledge of General Education information will improve Description: Including pain rating scale, medication(s)/side effects and non-pharmacologic comfort measures Outcome: Progressing   Problem: Health Behavior/Discharge Planning: Goal: Ability to manage health-related needs will improve Outcome: Progressing   Problem: Clinical Measurements: Goal: Ability to maintain clinical measurements within normal limits will improve Outcome: Progressing Goal: Will remain free from infection Outcome: Progressing Goal: Diagnostic test results will improve Outcome: Progressing Goal: Respiratory complications will improve Outcome: Progressing Goal: Cardiovascular complication will be avoided Outcome: Progressing   Problem: Activity: Goal: Risk for activity intolerance will decrease Outcome: Progressing   Problem: Nutrition: Goal: Adequate nutrition will be maintained Outcome: Progressing   Problem: Pain Managment: Goal: General experience of comfort will improve Outcome: Progressing   Problem: Skin Integrity: Goal: Risk for impaired skin integrity will decrease Outcome: Progressing   Problem: Education: Goal: Knowledge of disease and its progression will improve Outcome: Progressing   Problem: Clinical Measurements: Goal: Dialysis access will remain free of complications Outcome: Progressing

## 2019-05-18 NOTE — Progress Notes (Signed)
[facility] Progress Note    Assessment/ Plan:   1.  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  Patient now has declining GFR associated with mild hyperkalemia, acidosis and overt uremic symptoms.  He is status post right brachial cephalic AV fistula creation by Dr. Oneida Alar on 05/13/2019.  HD #1 11/19, HD #2 11/20, next planned HD 11/21.  CLIP in process.    2 Hypertension/fluid overload: Resume home medication.  BP expected to improve after HD.    3 Anemia due to CKD: Hemoglobin was 10 on admission.  Iron saturation 38% on 05/03/2019.  Started ESA  4 Metabolic bone disease: High phosphorus level.  Start PhosLo.  Will improve with HD.  5.  Nutrition: Albumin 1.7, prostat.     Subjective:    For HD #3 today.  Sleeping, feeling well.     Objective:   BP (!) 186/87 (BP Location: Left Arm)   Pulse 75   Temp 98.3 F (36.8 C) (Oral)   Resp 18   Ht 5' 11.5" (1.816 m)   Wt 81.6 kg   SpO2 100%   BMI 24.73 kg/m   Intake/Output Summary (Last 24 hours) at 05/18/2019 1125 Last data filed at 05/18/2019 0850 Gross per 24 hour  Intake 840 ml  Output 2105 ml  Net -1265 ml   Weight change: 2.693 kg  Physical Exam: Gen: NAD, lying flat in bed CVS: RRR no m/r/g Resp: clear anteriorly Abd: soft, nontender NABS Ext: 1+ LE edema ACCESS: R AVF + T/B, R IJ TDC   Imaging: Ir Fluoro Guide Cv Line Right  Result Date: 05/16/2019 INDICATION: 53 year old with end-stage renal disease and needs hemodialysis. EXAM: FLUOROSCOPIC AND ULTRASOUND GUIDED PLACEMENT OF A TUNNELED DIALYSIS CATHETER Physician: Stephan Minister. Anselm Pancoast, MD MEDICATIONS: Ancef 2 g; The antibiotic was administered within an appropriate time interval prior to skin puncture. ANESTHESIA/SEDATION: Versed 1.0 mg IV; Fentanyl 50 mcg IV; Moderate Sedation Time:  16 minutes The patient was continuously monitored during the procedure by the  interventional radiology nurse under my direct supervision. FLUOROSCOPY TIME:  Fluoroscopy Time: 24 seconds, 1 mGy COMPLICATIONS: None immediate. PROCEDURE: The procedure was explained to the patient. The risks and benefits of the procedure were discussed and the patient's questions were addressed. Informed consent was obtained from the patient. The patient was placed supine on the interventional table. Ultrasound confirmed a patent right internal jugular vein. Ultrasound images were obtained for documentation. The right neck and chest was prepped and draped in a sterile fashion. The right neck was anesthetized with 1% lidocaine. Maximal barrier sterile technique was utilized including caps, mask, sterile gowns, sterile gloves, sterile drape, hand hygiene and skin antiseptic. A small incision was made with #11 blade scalpel. A 21 gauge needle directed into the right internal jugular vein with ultrasound guidance. A micropuncture dilator set was placed. A 19 cm tip to cuff Palindrome catheter was selected. The skin below the right clavicle was anesthetized and a small incision was made with an #11 blade scalpel. A subcutaneous tunnel was formed to the vein dermatotomy site. The catheter was brought through the tunnel. The vein dermatotomy site was dilated to accommodate a peel-away sheath. The catheter was placed through the peel-away sheath and directed into the central venous structures. The tip of the catheter was placed at the SVC and right atrium junction with fluoroscopy. Fluoroscopic images were obtained for documentation. Both lumens were found to aspirate  and flush well. The proper amount of heparin was flushed in both lumens. The vein dermatotomy site was closed using a single layer of absorbable suture and Dermabond. The catheter was secured to the skin using Prolene suture. IMPRESSION: Successful placement of a right jugular tunneled dialysis catheter using ultrasound and fluoroscopic guidance.  Electronically Signed   By: Markus Daft M.D.   On: 05/16/2019 19:50   Ir US Guide Vasc Access Right  Result Date: 05/16/2019 INDICATION: 53 year old with end-stage renal disease and needs hemodialysis. EXAM: FLUOROSCOPIC AND ULTRASOUND GUIDED PLACEMENT OF A TUNNELED DIALYSIS CATHETER Physician: Stephan Minister. Anselm Pancoast, MD MEDICATIONS: Ancef 2 g; The antibiotic was administered within an appropriate time interval prior to skin puncture. ANESTHESIA/SEDATION: Versed 1.0 mg IV; Fentanyl 50 mcg IV; Moderate Sedation Time:  16 minutes The patient was continuously monitored during the procedure by the interventional radiology nurse under my direct supervision. FLUOROSCOPY TIME:  Fluoroscopy Time: 24 seconds, 1 mGy COMPLICATIONS: None immediate. PROCEDURE: The procedure was explained to the patient. The risks and benefits of the procedure were discussed and the patient's questions were addressed. Informed consent was obtained from the patient. The patient was placed supine on the interventional table. Ultrasound confirmed a patent right internal jugular vein. Ultrasound images were obtained for documentation. The right neck and chest was prepped and draped in a sterile fashion. The right neck was anesthetized with 1% lidocaine. Maximal barrier sterile technique was utilized including caps, mask, sterile gowns, sterile gloves, sterile drape, hand hygiene and skin antiseptic. A small incision was made with #11 blade scalpel. A 21 gauge needle directed into the right internal jugular vein with ultrasound guidance. A micropuncture dilator set was placed. A 19 cm tip to cuff Palindrome catheter was selected. The skin below the right clavicle was anesthetized and a small incision was made with an #11 blade scalpel. A subcutaneous tunnel was formed to the vein dermatotomy site. The catheter was brought through the tunnel. The vein dermatotomy site was dilated to accommodate a peel-away sheath. The catheter was placed through the  peel-away sheath and directed into the central venous structures. The tip of the catheter was placed at the SVC and right atrium junction with fluoroscopy. Fluoroscopic images were obtained for documentation. Both lumens were found to aspirate and flush well. The proper amount of heparin was flushed in both lumens. The vein dermatotomy site was closed using a single layer of absorbable suture and Dermabond. The catheter was secured to the skin using Prolene suture. IMPRESSION: Successful placement of a right jugular tunneled dialysis catheter using ultrasound and fluoroscopic guidance. Electronically Signed   By: Markus Daft M.D.   On: 05/16/2019 19:50   Dg Foot Complete Left  Result Date: 05/16/2019 CLINICAL DATA:  Left foot wound. EXAM: LEFT FOOT - COMPLETE 3+ VIEW COMPARISON:  June 02, 2016. FINDINGS: There is no evidence of fracture or dislocation. There is no evidence of arthropathy or other focal bone abnormality. Soft tissues are unremarkable. IMPRESSION: Negative. Electronically Signed   By: Marijo Conception M.D.   On: 05/16/2019 13:48    Labs: BMET Recent Labs  Lab 05/13/19 0621 05/15/19 2256 05/16/19 0437 05/17/19 0602 05/18/19 0528  NA 134* 136 134* 137 137  K 4.2 5.1 5.0 4.2 3.8  CL 103 106 104 106 106  CO2  --  17* 14* 18* 21*  GLUCOSE 96 197* 353* 110* 118*  BUN 107* 114* 111* 97* 67*  CREATININE 13.30* 12.56* 12.44* 11.19* 8.33*  CALCIUM  --  7.4* 7.2* 6.6* 7.3*  PHOS  --   --  9.5* 8.1*  --    CBC Recent Labs  Lab 05/15/19 2256 05/16/19 0437 05/17/19 0602 05/18/19 0528  WBC 5.6 4.9 4.4 5.5  HGB 10.0* 9.4* 8.9* 8.5*  HCT 31.2* 29.1* 27.8* 26.9*  MCV 73.9* 72.6* 73.4* 74.3*  PLT 259 233 215 196    Medications:    . amLODipine  10 mg Oral Daily  . aspirin EC  81 mg Oral Daily  . atorvastatin  40 mg Oral Daily  . calcitRIOL  1 mcg Oral Daily  . calcium acetate  1,334 mg Oral TID WC  . carvedilol  12.5 mg Oral BID WC  . Chlorhexidine Gluconate Cloth  6  each Topical Q0600  . darbepoetin (ARANESP) injection - DIALYSIS  60 mcg Intravenous Q Thu-HD  . ferrous sulfate  325 mg Oral BID WC  . heparin  5,000 Units Subcutaneous Q8H  . hydrALAZINE  25 mg Oral Q8H  . insulin aspart  0-5 Units Subcutaneous QHS  . insulin aspart  0-9 Units Subcutaneous TID WC  . insulin glargine  10 Units Subcutaneous Q2200  . mupirocin cream   Topical Daily  . sodium chloride flush  3 mL Intravenous Once      Madelon Lips, MD 05/18/2019, 11:25 AM

## 2019-05-18 NOTE — Plan of Care (Signed)
  Problem: Education: Goal: Knowledge of General Education information will improve Description: Including pain rating scale, medication(s)/side effects and non-pharmacologic comfort measures Outcome: Progressing   Problem: Clinical Measurements: Goal: Diagnostic test results will improve Outcome: Progressing   

## 2019-05-18 NOTE — Discharge Summary (Addendum)
[facility] Discharge Summary  Patient name: Jeremiah [name] Medical record number: [id number] Date of birth: 1965-12-19 Age: 53 y.o. Gender: male Date of Admission: 05/15/2019  Date of Discharge: 05/20/19   Admitting Physician: Leeanne Rio, MD  Primary Care Provider: Gladys Damme, MD Consultants: Nephrology, IR, vascular  Indication for Hospitalization: volume overload needing dialysis  Discharge Diagnoses/Problem List:  ESRD stage V, new start HD Type 2 diabetes Hypertension Hyperlipidemia Anemia Diabetic Neuropathy  Osteoarthritis   Disposition: Home  Discharge Condition: Stable and improved  Discharge Exam:   GEN: Well-nourished well-developed, in no acute distress CV: regular rate and rhythm, no murmurs appreciated, RUE fistula, temporary dialysis catheter in place RESP: no increased work of breathing, clear to ascultation bilaterally  ABD: Bowel sounds present. Soft, Nontender, Nondistended. MSK: no lower extremity edema SKIN: warm, dry, left foot wound 2nd -4th toes, dressing clean dry and intact NEURO: Alert and oriented, moves all extremities appropriately  Brief Hospital Course:   ESRD Jeremiah [name] is a 53 y.o. male admitted after having worsening of bilateral lower extremity penile edema.  Patient had AV fistula placed on 05/13/2019 however as the fistula was still maturing was not able to be used for dialysis.  Patient received in for a dialysis catheter on 05/16/2019 and had urgent dialysis.  Nephrology followed patient throughout this admission.  Patient tolerated dialysis well.  Bilateral lower extremity and penile edema resolved prior to discharge.  Patient has a Nature conservation officer for dialysis  at [facility] on a MWF schedule with a seat time of 12:05pm.   Diabetes Patient was hyperglycemic at home prior to arrival glucose >500.  A1c this admission 10.8.  Patient received 10 - 12 units of Lantus  daily.    Hypertension Patient was hypertensive on admission which improved with his home medications, the addition of losartan 50 mg and hemodialysis.  He was prescribed losartan at discharge for diabetic nephropathy and hypertensive augmentation.  Left foot wounds Patient reported burning his feet with hot water at home.  He was found to have foot wounds on his left foot on the second through the fourth toes.  He was followed by the wound nurse.  X-ray obtained did not indicate acute abnormalities.  We also consulted vascular surgery and ABIs were performed.  Patient to follow-up with vascular outpatient.  Additionally, he will need wound care follow-up with his PCP.  Issues for Follow Up:   Patient started on MWF hemodialysis schedule at [facility] with a seat time of 12:05pm.  Please encourage patient to attend all of his dialysis sessions regularly.  Started patient on Losartan 50 mg daily for diabetic nephropathy and hypertension. Patient needs 1 week follow up to assess potassium levels.   Patient's T2DM is uncontrolled, admission a1c 10.8, patient discharged with 10 units Lantis daily. Continue to titrate patient's lantis outpatient.   Significant Procedures: None  Significant Labs and Imaging:  Recent Labs  Lab 05/18/19 0528 05/19/19 0452 05/20/19 0838  WBC 5.5 5.2 6.5  HGB 8.5* 8.8* 9.5*  HCT 26.9* 28.8* 31.5*  PLT 196 214 296   Recent Labs  Lab 05/16/19 0437 05/17/19 0602 05/18/19 0528 05/19/19 0452 05/19/19 2002 05/20/19 0838  NA 134* 137 137 137  --  139  K 5.0 4.2 3.8 4.3  --  4.6  CL 104 106 106 104  --  101  CO2 14* 18* 21* 25  --  29  GLUCOSE 353* 110* 118* 158*  601* 133*  BUN 111* 97* 67* 42*  --  60*  CREATININE 12.44* 11.19* 8.33* 5.78*  --  7.49*  CALCIUM 7.2* 6.6* 7.3* 7.8*  --  8.9  PHOS 9.5* 8.1*  --  4.5  --  5.5*  ALBUMIN 2.0* 1.7*  --  1.8*  --  2.2*    Dg Chest 2 View  Result Date: 05/15/2019 CLINICAL DATA:  Bilateral lower  extremity swelling EXAM: CHEST - 2 VIEW COMPARISON:  01/19/2018 FINDINGS: The heart size and mediastinal contours are within normal limits. Both lungs are clear. The visualized skeletal structures are unremarkable. IMPRESSION: No active cardiopulmonary disease. Electronically Signed   By: Rolm Baptise M.D.   On: 05/15/2019 23:15   Ir Fluoro Guide Cv Line Right  Result Date: 05/16/2019 INDICATION: 53 year old with end-stage renal disease and needs hemodialysis. EXAM: FLUOROSCOPIC AND ULTRASOUND GUIDED PLACEMENT OF A TUNNELED DIALYSIS CATHETER Physician: Stephan Minister. Anselm Pancoast, MD MEDICATIONS: Ancef 2 g; The antibiotic was administered within an appropriate time interval prior to skin puncture. ANESTHESIA/SEDATION: Versed 1.0 mg IV; Fentanyl 50 mcg IV; Moderate Sedation Time:  16 minutes The patient was continuously monitored during the procedure by the interventional radiology nurse under my direct supervision. FLUOROSCOPY TIME:  Fluoroscopy Time: 24 seconds, 1 mGy COMPLICATIONS: None immediate. PROCEDURE: The procedure was explained to the patient. The risks and benefits of the procedure were discussed and the patient's questions were addressed. Informed consent was obtained from the patient. The patient was placed supine on the interventional table. Ultrasound confirmed a patent right internal jugular vein. Ultrasound images were obtained for documentation. The right neck and chest was prepped and draped in a sterile fashion. The right neck was anesthetized with 1% lidocaine. Maximal barrier sterile technique was utilized including caps, mask, sterile gowns, sterile gloves, sterile drape, hand hygiene and skin antiseptic. A small incision was made with #11 blade scalpel. A 21 gauge needle directed into the right internal jugular vein with ultrasound guidance. A micropuncture dilator set was placed. A 19 cm tip to cuff Palindrome catheter was selected. The skin below the right clavicle was anesthetized and a small  incision was made with an #11 blade scalpel. A subcutaneous tunnel was formed to the vein dermatotomy site. The catheter was brought through the tunnel. The vein dermatotomy site was dilated to accommodate a peel-away sheath. The catheter was placed through the peel-away sheath and directed into the central venous structures. The tip of the catheter was placed at the SVC and right atrium junction with fluoroscopy. Fluoroscopic images were obtained for documentation. Both lumens were found to aspirate and flush well. The proper amount of heparin was flushed in both lumens. The vein dermatotomy site was closed using a single layer of absorbable suture and Dermabond. The catheter was secured to the skin using Prolene suture. IMPRESSION: Successful placement of a right jugular tunneled dialysis catheter using ultrasound and fluoroscopic guidance. Electronically Signed   By: Markus Daft M.D.   On: 05/16/2019 19:50   Ir US Guide Vasc Access Right  Result Date: 05/16/2019 INDICATION: 53 year old with end-stage renal disease and needs hemodialysis. EXAM: FLUOROSCOPIC AND ULTRASOUND GUIDED PLACEMENT OF A TUNNELED DIALYSIS CATHETER Physician: Stephan Minister. Anselm Pancoast, MD MEDICATIONS: Ancef 2 g; The antibiotic was administered within an appropriate time interval prior to skin puncture. ANESTHESIA/SEDATION: Versed 1.0 mg IV; Fentanyl 50 mcg IV; Moderate Sedation Time:  16 minutes The patient was continuously monitored during the procedure by the interventional radiology nurse under my direct supervision. FLUOROSCOPY TIME:  Fluoroscopy Time: 24 seconds, 1 mGy COMPLICATIONS: None immediate. PROCEDURE: The procedure was explained to the patient. The risks and benefits of the procedure were discussed and the patient's questions were addressed. Informed consent was obtained from the patient. The patient was placed supine on the interventional table. Ultrasound confirmed a patent right internal jugular vein. Ultrasound images were obtained  for documentation. The right neck and chest was prepped and draped in a sterile fashion. The right neck was anesthetized with 1% lidocaine. Maximal barrier sterile technique was utilized including caps, mask, sterile gowns, sterile gloves, sterile drape, hand hygiene and skin antiseptic. A small incision was made with #11 blade scalpel. A 21 gauge needle directed into the right internal jugular vein with ultrasound guidance. A micropuncture dilator set was placed. A 19 cm tip to cuff Palindrome catheter was selected. The skin below the right clavicle was anesthetized and a small incision was made with an #11 blade scalpel. A subcutaneous tunnel was formed to the vein dermatotomy site. The catheter was brought through the tunnel. The vein dermatotomy site was dilated to accommodate a peel-away sheath. The catheter was placed through the peel-away sheath and directed into the central venous structures. The tip of the catheter was placed at the SVC and right atrium junction with fluoroscopy. Fluoroscopic images were obtained for documentation. Both lumens were found to aspirate and flush well. The proper amount of heparin was flushed in both lumens. The vein dermatotomy site was closed using a single layer of absorbable suture and Dermabond. The catheter was secured to the skin using Prolene suture. IMPRESSION: Successful placement of a right jugular tunneled dialysis catheter using ultrasound and fluoroscopic guidance. Electronically Signed   By: Markus Daft M.D.   On: 05/16/2019 19:50   Dg Foot Complete Left  Result Date: 05/16/2019 CLINICAL DATA:  Left foot wound. EXAM: LEFT FOOT - COMPLETE 3+ VIEW COMPARISON:  June 02, 2016. FINDINGS: There is no evidence of fracture or dislocation. There is no evidence of arthropathy or other focal bone abnormality. Soft tissues are unremarkable. IMPRESSION: Negative. Electronically Signed   By: Marijo Conception M.D.   On: 05/16/2019 13:48     Results/Tests Pending at  Time of Discharge: None  Discharge Medications:  Allergies as of 05/20/2019   No Known Allergies     Medication List    STOP taking these medications   oxyCODONE-acetaminophen 5-325 MG tablet Commonly known as: PERCOCET/ROXICET     TAKE these medications   Accu-Chek Multiclix Lancet Dev Kit 1 each by Does not apply route 4 (four) times daily as needed.   accu-chek multiclix lancets Use as instructed   Alcohol Pads 70 % Pads 1 each by Does not apply route 4 (four) times daily as needed.   amLODipine 10 MG tablet Commonly known as: NORVASC TAKE 1 TABLET BY MOUTH ONCE DAILY   aspirin EC 81 MG tablet Take 1 tablet (81 mg total) by mouth daily.   atorvastatin 40 MG tablet Commonly known as: LIPITOR Take 1 tablet (40 mg total) by mouth daily.   blood glucose meter kit and supplies Kit Dispense based on patient and insurance preference. Use up to four times daily as directed. (FOR ICD-9 250.00, 250.01).   calcitRIOL 0.25 MCG capsule Commonly known as: ROCALTROL Take 1 capsule (0.25 mcg total) by mouth daily. What changed: how much to take   calcium acetate 667 MG capsule Commonly known as: PHOSLO Take 1 capsule (667 mg total) by mouth 3 (three) times daily  with meals for 90 doses.   calcium carbonate 750 MG chewable tablet Commonly known as: TUMS EX Chew 1 tablet by mouth See admin instructions. Chew 1 tablet by mouth one to two times a day   carvedilol 12.5 MG tablet Commonly known as: COREG Take 1 tablet (12.5 mg total) by mouth 2 (two) times daily with a meal. What changed:   medication strength  how much to take  when to take this   Darbepoetin Alfa 60 MCG/0.3ML Sosy injection Commonly known as: ARANESP Inject 0.3 mLs (60 mcg total) into the vein every Thursday with hemodialysis.   ferrous sulfate 325 (65 FE) MG tablet Take 1 tablet (325 mg total) by mouth 2 (two) times daily with a meal.   glucose blood test strip Commonly known as: Accu-Chek Aviva  Plus Use as instructed   hydrALAZINE 25 MG tablet Commonly known as: APRESOLINE Take 25 mg by mouth See admin instructions. Take 25 mg by mouth two to three times a day   insulin lispro 100 UNIT/ML KwikPen Commonly known as: HumaLOG KwikPen Inject 0.05 mLs (5 Units total) into the skin 3 (three) times daily. Increase as instructed. Max daily dose 30 units What changed:   how much to take  when to take this  reasons to take this  additional instructions   Lantus SoloStar 100 UNIT/ML Solostar Pen Generic drug: Insulin Glargine INJECT 10 UNITS SUBCUTANEOUSLY ONCE DAILY What changed: See the new instructions.   Lokelma 5 g packet Generic drug: sodium zirconium cyclosilicate Take 5 g by mouth daily. MIX AND DRINK   losartan 50 MG tablet Commonly known as: COZAAR Take 1 tablet (50 mg total) by mouth daily.   Pen Needles 32G X 4 MM Misc 100 pens by Does not apply route 4 (four) times daily. Use to inject insulin four times daily   polyethylene glycol 17 g packet Commonly known as: MIRALAX / GLYCOLAX Take 17 g by mouth daily as needed for moderate constipation.       Discharge Instructions: Please refer to Patient Instructions section of EMR for full details.  Patient was counseled important signs and symptoms that should prompt return to medical care, changes in medications, dietary instructions, activity restrictions, and follow up appointments.    Lyndee Hensen, MD 05/21/2019, 2:55 PM PGY-1, [place] Upper-Level Resident Addendum I have independently interviewed and examined the patient. I have discussed the above with the original author and agree with their documentation. My edits for correction/addition/clarification are in blue. Please see also any attending notes.    Milus Banister, DO PGY-2, [facility] 05/24/2019 6:58 PM  [place] Service pager: [phone] (text pages welcome through [place])

## 2019-05-19 DIAGNOSIS — E1122 Type 2 diabetes mellitus with diabetic chronic kidney disease: Secondary | ICD-10-CM

## 2019-05-19 DIAGNOSIS — Z794 Long term (current) use of insulin: Secondary | ICD-10-CM

## 2019-05-19 LAB — RENAL FUNCTION PANEL
Albumin: 1.8 g/dL — ABNORMAL LOW (ref 3.5–5.0)
Anion gap: 8 (ref 5–15)
BUN: 42 mg/dL — ABNORMAL HIGH (ref 6–20)
CO2: 25 mmol/L (ref 22–32)
Calcium: 7.8 mg/dL — ABNORMAL LOW (ref 8.9–10.3)
Chloride: 104 mmol/L (ref 98–111)
Creatinine, Ser: 5.78 mg/dL — ABNORMAL HIGH (ref 0.61–1.24)
GFR calc Af Amer: 12 mL/min — ABNORMAL LOW (ref >60)
GFR calc non Af Amer: 10 mL/min — ABNORMAL LOW (ref >60)
Glucose, Bld: 158 mg/dL — ABNORMAL HIGH (ref 70–99)
Phosphorus: 4.5 mg/dL (ref 2.5–4.6)
Potassium: 4.3 mmol/L (ref 3.5–5.1)
Sodium: 137 mmol/L (ref 135–145)

## 2019-05-19 LAB — GLUCOSE, CAPILLARY
Glucose-Capillary: 117 mg/dL — ABNORMAL HIGH (ref 70–99)
Glucose-Capillary: 252 mg/dL — ABNORMAL HIGH (ref 70–99)
Glucose-Capillary: 451 mg/dL — ABNORMAL HIGH (ref 70–99)
Glucose-Capillary: >600 mg/dL (ref 70–99)
Glucose-Capillary: 599 mg/dL (ref 70–99)
Glucose-Capillary: 484 mg/dL — ABNORMAL HIGH (ref 70–99)

## 2019-05-19 LAB — CBC WITH DIFFERENTIAL/PLATELET
Abs Immature Granulocytes: 0.01 10*3/uL (ref 0.00–0.07)
Basophils Absolute: 0 10*3/uL (ref 0.0–0.1)
Basophils Relative: 1 %
Eosinophils Absolute: 0 10*3/uL (ref 0.0–0.5)
Eosinophils Relative: 1 %
HCT: 28.8 % — ABNORMAL LOW (ref 39.0–52.0)
Hemoglobin: 8.8 g/dL — ABNORMAL LOW (ref 13.0–17.0)
Immature Granulocytes: 0 %
Lymphocytes Relative: 20 %
Lymphs Abs: 1.1 10*3/uL (ref 0.7–4.0)
MCH: 23.1 pg — ABNORMAL LOW (ref 26.0–34.0)
MCHC: 30.6 g/dL (ref 30.0–36.0)
MCV: 75.6 fL — ABNORMAL LOW (ref 80.0–100.0)
Monocytes Absolute: 0.8 10*3/uL (ref 0.1–1.0)
Monocytes Relative: 16 %
Neutro Abs: 3.3 10*3/uL (ref 1.7–7.7)
Neutrophils Relative %: 62 %
Platelets: 214 10*3/uL (ref 150–400)
RBC: 3.81 MIL/uL — ABNORMAL LOW (ref 4.22–5.81)
RDW: 18.3 % — ABNORMAL HIGH (ref 11.5–15.5)
WBC: 5.2 10*3/uL (ref 4.0–10.5)
nRBC: 0 % (ref 0.0–0.2)

## 2019-05-19 LAB — GLUCOSE, RANDOM: Glucose, Bld: 601 mg/dL (ref 70–99)

## 2019-05-19 MED ORDER — BISACODYL 5 MG PO TBEC
5.0000 mg | DELAYED_RELEASE_TABLET | Freq: Every day | ORAL | Status: DC | PRN
  Administered 2019-05-19 – 2019-05-20 (×2): 5 mg via ORAL
  Filled 2019-05-19 (×2): qty 1

## 2019-05-19 MED ORDER — POLYETHYLENE GLYCOL 3350 17 G PO PACK: 17.0000 g | PACK | Freq: Every day | ORAL | Status: DC | PRN

## 2019-05-19 MED ORDER — INSULIN GLARGINE 100 UNIT/ML ~~LOC~~ SOLN
12.0000 [IU] | Freq: Every day | SUBCUTANEOUS | Status: DC
  Administered 2019-05-19: 12 [IU] via SUBCUTANEOUS
  Filled 2019-05-19 (×2): qty 0.12

## 2019-05-19 NOTE — Progress Notes (Signed)
Patient had BS 451 @ 1700 and was to inform MD with results. Notified resident for pm shift and serum glucose ordered to be drawn. MD to be inform of result for coverage.

## 2019-05-19 NOTE — Plan of Care (Signed)
  Problem: Education: Goal: Knowledge of General Education information will improve Description: Including pain rating scale, medication(s)/side effects and non-pharmacologic comfort measures Outcome: Progressing   Problem: Clinical Measurements: Goal: Ability to maintain clinical measurements within normal limits will improve Outcome: Progressing Goal: Diagnostic test results will improve Outcome: Progressing   

## 2019-05-19 NOTE — Progress Notes (Signed)
[facility] Progress Note    Assessment/ Plan:   1.  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  Patient now has declining GFR associated with mild hyperkalemia, acidosis and overt uremic symptoms.  He is status post right brachial cephalic AV fistula creation by Dr. Oneida Alar on 05/13/2019.  Has tunneled HD cath.  HD #1 11/19, HD #2 11/20, HD 11/21.  Next planned HD 11/23 CLIP in process.    2 Hypertension/fluid overload: Resume home medication.  BP expected to improve after HD.    3 Anemia due to CKD: Hemoglobin was 10 on admission.  Iron saturation 38% on 05/03/2019.  Started ESA  4 Metabolic bone disease: High phosphorus level.  Start PhosLo.  Will improve with HD.  5.  Nutrition: Albumin 1.7, prostat.     Subjective:    Seen in room.  Had HD yesterday with 2.5L off.  Will plan for HD tomorrow 11/23 for holiday schedule.     Objective:   BP (!) 193/89 (BP Location: Left Arm)   Pulse 85   Temp 98.2 F (36.8 C) (Oral)   Resp 18   Ht 5' 11.5" (1.816 m)   Wt 81.2 kg   SpO2 100%   BMI 24.62 kg/m   Intake/Output Summary (Last 24 hours) at 05/19/2019 1101 Last data filed at 05/19/2019 0825 Gross per 24 hour  Intake 1260 ml  Output 3200 ml  Net -1940 ml   Weight change: -4.6 kg  Physical Exam: Gen: NAD, lying flat in bed CVS: RRR no m/r/g Resp: clear anteriorly Abd: soft, nontender NABS Ext: 1+ LE edema ACCESS: R AVF + T/B, R IJ TDC   Imaging: No results found.  Labs: BMET Recent Labs  Lab 05/13/19 0621 05/15/19 2256 05/16/19 0437 05/17/19 0602 05/18/19 0528 05/19/19 0452  NA 134* 136 134* 137 137 137  K 4.2 5.1 5.0 4.2 3.8 4.3  CL 103 106 104 106 106 104  CO2  --  17* 14* 18* 21* 25  GLUCOSE 96 197* 353* 110* 118* 158*  BUN 107* 114* 111* 97* 67* 42*  CREATININE 13.30* 12.56* 12.44* 11.19* 8.33* 5.78*  CALCIUM  --  7.4* 7.2* 6.6* 7.3* 7.8*  PHOS  --   --   9.5* 8.1*  --  4.5   CBC Recent Labs  Lab 05/16/19 0437 05/17/19 0602 05/18/19 0528 05/19/19 0452  WBC 4.9 4.4 5.5 5.2  NEUTROABS  --   --   --  3.3  HGB 9.4* 8.9* 8.5* 8.8*  HCT 29.1* 27.8* 26.9* 28.8*  MCV 72.6* 73.4* 74.3* 75.6*  PLT 233 215 196 214    Medications:    . amLODipine  10 mg Oral Daily  . aspirin EC  81 mg Oral Daily  . atorvastatin  40 mg Oral Daily  . calcitRIOL  1 mcg Oral Daily  . calcium acetate  1,334 mg Oral TID WC  . carvedilol  12.5 mg Oral BID WC  . Chlorhexidine Gluconate Cloth  6 each Topical Q0600  . darbepoetin (ARANESP) injection - DIALYSIS  60 mcg Intravenous Q Thu-HD  . feeding supplement (PRO-STAT SUGAR FREE 64)  30 mL Oral BID  . ferrous sulfate  325 mg Oral BID WC  . heparin  5,000 Units Subcutaneous Q8H  . hydrALAZINE  25 mg Oral Q8H  . insulin aspart  0-5 Units Subcutaneous QHS  . insulin aspart  0-9 Units Subcutaneous TID WC  .  insulin glargine  12 Units Subcutaneous Q2200  . mupirocin cream   Topical Daily      Madelon Lips, MD 05/19/2019, 11:01 AM

## 2019-05-20 ENCOUNTER — Ambulatory Visit: Payer: Medicaid Other

## 2019-05-20 ENCOUNTER — Inpatient Hospital Stay (HOSPITAL_COMMUNITY): Payer: Medicaid Other

## 2019-05-20 DIAGNOSIS — I96 Gangrene, not elsewhere classified: Secondary | ICD-10-CM

## 2019-05-20 LAB — RENAL FUNCTION PANEL
Albumin: 2.2 g/dL — ABNORMAL LOW (ref 3.5–5.0)
Anion gap: 9 (ref 5–15)
BUN: 60 mg/dL — ABNORMAL HIGH (ref 6–20)
CO2: 29 mmol/L (ref 22–32)
Calcium: 8.9 mg/dL (ref 8.9–10.3)
Chloride: 101 mmol/L (ref 98–111)
Creatinine, Ser: 7.49 mg/dL — ABNORMAL HIGH (ref 0.61–1.24)
GFR calc Af Amer: 9 mL/min — ABNORMAL LOW (ref >60)
GFR calc non Af Amer: 7 mL/min — ABNORMAL LOW (ref >60)
Glucose, Bld: 133 mg/dL — ABNORMAL HIGH (ref 70–99)
Phosphorus: 5.5 mg/dL — ABNORMAL HIGH (ref 2.5–4.6)
Potassium: 4.6 mmol/L (ref 3.5–5.1)
Sodium: 139 mmol/L (ref 135–145)

## 2019-05-20 LAB — GLUCOSE, CAPILLARY
Glucose-Capillary: 380 mg/dL — ABNORMAL HIGH (ref 70–99)
Glucose-Capillary: 85 mg/dL (ref 70–99)
Glucose-Capillary: 136 mg/dL — ABNORMAL HIGH (ref 70–99)
Glucose-Capillary: 255 mg/dL — ABNORMAL HIGH (ref 70–99)

## 2019-05-20 LAB — CBC
HCT: 31.5 % — ABNORMAL LOW (ref 39.0–52.0)
Hemoglobin: 9.5 g/dL — ABNORMAL LOW (ref 13.0–17.0)
MCH: 23.2 pg — ABNORMAL LOW (ref 26.0–34.0)
MCHC: 30.2 g/dL (ref 30.0–36.0)
MCV: 76.8 fL — ABNORMAL LOW (ref 80.0–100.0)
Platelets: 296 10*3/uL (ref 150–400)
RBC: 4.1 MIL/uL — ABNORMAL LOW (ref 4.22–5.81)
RDW: 17.4 % — ABNORMAL HIGH (ref 11.5–15.5)
WBC: 6.5 10*3/uL (ref 4.0–10.5)
nRBC: 0 % (ref 0.0–0.2)

## 2019-05-20 MED ORDER — POLYETHYLENE GLYCOL 3350 17 G PO PACK: 17.0000 g | PACK | Freq: Every day | ORAL | 0 refills | Status: DC | PRN

## 2019-05-20 MED ORDER — LOSARTAN POTASSIUM 50 MG PO TABS: 50.0000 mg | ORAL_TABLET | Freq: Every day | ORAL | 0 refills | Status: DC

## 2019-05-20 MED ORDER — LOSARTAN POTASSIUM 50 MG PO TABS: 50.0000 mg | ORAL_TABLET | Freq: Every day | ORAL | Status: DC

## 2019-05-20 MED ORDER — CALCIUM ACETATE (PHOS BINDER) 667 MG PO CAPS: 667.0000 mg | ORAL_CAPSULE | Freq: Three times a day (TID) | ORAL | 0 refills | Status: AC

## 2019-05-20 MED ORDER — DARBEPOETIN ALFA 60 MCG/0.3ML IJ SOSY: 60.0000 ug | PREFILLED_SYRINGE | INTRAMUSCULAR | Status: DC

## 2019-05-20 MED ORDER — CALCITRIOL 0.5 MCG PO CAPS: 1.0000 ug | ORAL_CAPSULE | ORAL | Status: DC

## 2019-05-20 MED ORDER — CARVEDILOL 12.5 MG PO TABS: 12.5000 mg | ORAL_TABLET | Freq: Two times a day (BID) | ORAL | 0 refills | Status: DC

## 2019-05-20 MED ORDER — HEPARIN SODIUM (PORCINE) 1000 UNIT/ML IJ SOLN
INTRAMUSCULAR | Status: AC
  Administered 2019-05-20: 3200 [IU]
  Filled 2019-05-20: qty 3

## 2019-05-20 NOTE — Progress Notes (Signed)
Patient has been accepted for OP HD treatment at [facility] on a MWF schedule with a seat time of 12:05pm. He needs to arrive 20 minutes early to his appointments. On his first day of treatment, he needs to arrive at 11:05am to complete intake paperwork.  Renal Navigator notified Nephrologist/Dr. Royce Macadamia. Renal Navigator met with patient at HD bedside to provide clinic information and schedule. Patient very appreciative and states no questions.   Alphonzo Cruise, [place] Renal Navigator [phone]

## 2019-05-20 NOTE — Progress Notes (Signed)
Jeremiah [name] Progress Note    Assessment/ Plan:   1.  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  Patient now has declining GFR associated with mild hyperkalemia, acidosis and overt uremic symptoms.  He is status post right brachial cephalic AV fistula creation by Dr. Oneida Alar on 05/13/2019.  Has tunneled HD cath.  HD #1 11/19, HD #2 11/20, HD 11/21.   - HD today, 11/23 - CLIP in process.  Unit not yet assigned  2 Hypertension/fluid overload: Resume home medication.  hopeful for improvement with HD    3 Anemia due to CKD: Hemoglobin was 10 on admission.  Iron saturation 38% on 05/03/2019.  On ESA  4 Metabolic bone disease: hyperphos -on PhosLo and HD.  5.  Nutrition: Albumin 1.7, prostat.     Subjective:    States that HD has gone well. Hasn't heard about outpt unit yet.  Labs from today are ordered for 5 - not yet done.    Review of systems: denies shortness of breath or chest pain   Objective:   BP (!) 178/87 (BP Location: Left Arm)   Pulse 73   Temp 98.4 F (36.9 C) (Oral)   Resp 16   Ht 5' 11.5" (1.816 m)   Wt 80 kg   SpO2 99%   BMI 24.26 kg/m   Intake/Output Summary (Last 24 hours) at 05/20/2019 0810 Last data filed at 05/20/2019 0600 Gross per 24 hour  Intake 390 ml  Output 1400 ml  Net -1010 ml   Weight change: -1.586 kg  Physical Exam: Gen: NAD, lying flat in bed  CVS: RRR no m/r/g Resp: clear and unlabored Abd: soft, nontender NABS Ext: no LE edema ACCESS: R AVF + T/B, R IJ TDC   Imaging: No results found.  Labs: BMET Recent Labs  Lab 05/15/19 2256 05/16/19 0437 05/17/19 0602 05/18/19 0528 05/19/19 0452 05/19/19 2002  NA 136 134* 137 137 137  --   K 5.1 5.0 4.2 3.8 4.3  --   CL 106 104 106 106 104  --   CO2 17* 14* 18* 21* 25  --   GLUCOSE 197* 353* 110* 118* 158* 601*  BUN 114* 111* 97* 67* 42*  --   CREATININE 12.56* 12.44* 11.19* 8.33* 5.78*   --   CALCIUM 7.4* 7.2* 6.6* 7.3* 7.8*  --   PHOS  --  9.5* 8.1*  --  4.5  --    CBC Recent Labs  Lab 05/16/19 0437 05/17/19 0602 05/18/19 0528 05/19/19 0452  WBC 4.9 4.4 5.5 5.2  NEUTROABS  --   --   --  3.3  HGB 9.4* 8.9* 8.5* 8.8*  HCT 29.1* 27.8* 26.9* 28.8*  MCV 72.6* 73.4* 74.3* 75.6*  PLT 233 215 196 214    Medications:    . amLODipine  10 mg Oral Daily  . aspirin EC  81 mg Oral Daily  . atorvastatin  40 mg Oral Daily  . calcitRIOL  1 mcg Oral Daily  . calcium acetate  1,334 mg Oral TID WC  . carvedilol  12.5 mg Oral BID WC  . Chlorhexidine Gluconate Cloth  6 each Topical Q0600  . darbepoetin (ARANESP) injection - DIALYSIS  60 mcg Intravenous Q Thu-HD  . feeding supplement (PRO-STAT SUGAR FREE 64)  30 mL Oral BID  . ferrous sulfate  325 mg Oral BID WC  . heparin  5,000 Units Subcutaneous Q8H  .  hydrALAZINE  25 mg Oral Q8H  . insulin aspart  0-5 Units Subcutaneous QHS  . insulin aspart  0-9 Units Subcutaneous TID WC  . insulin glargine  12 Units Subcutaneous Q2200  . mupirocin cream   Topical Daily    Jeremiah [name] 05/20/2019, 8:10 AM

## 2019-05-20 NOTE — Progress Notes (Signed)
Per Dr. Shelda Altes, patient is cleared for discharge after HD treatment today, 05/20/19. He will start in the clinic tomorrow, 05/21/19. OP HD clinic[facility] aware of patient's start. Renal Navigator has spoken with Primary/Dr. Estevan Oaks. Renal Navigator will follow up with patient.   [phone]

## 2019-05-20 NOTE — Discharge Instructions (Addendum)
You were hospitalized at [facility] due to leg swelling.  We expect this is from volume overload and decreased kidney function which improved after dialysis.  We are so glad you are feeling better.  Be sure to follow-up with your regularly scheduled dialysis appointments.  Please also be sure to follow-up with your PCP at your earliest convenience.  Thank you for allowing [place] to take care of you.  - You start outpatient dialysis tomorrow ! This week will be different than your regularly scheduled dialysis as it is a holiday week.  - You were started on another hypertension medication, Losartan,  that will help protect your kidneys.  Please pick this medication up from your pharmacy.  - Continue to take 10 units of Lantis outpatient as your blood sugars / diabetes are elevated. A list of endocrinologists (diabetes doctors) is below.  - Neosporin / Bactroban to left toe wounds daily, then cover with foam dressing. Change foam dressing every three days or as needed if they become soiled with drainage.   - Follow up with vascular surgery regarding your foot wounds when you follow up for your fistula in your arm.    Take care, [facility] family medicine team    Diabetes Referral list:    Local Endocrinologists [facility] [phone]) 1. Dr. Philemon Kingdom 2. Dr. Janie Morning [facility] [phone]) 1. Dr. Delrae Rend [facility] [phone]) 1. Dr. Jacelyn Pi 2. Dr. Anda Kraft [facility] [phone]) 1. Dr. Daneil Dolin [facility] [phone]) [[place] office]  [phone]) [[place] office] 1. Dr. Lenna Sciara [place] 2. Dr. Judithann Sheen [facility]) [phone]) 1. Autumn Hudnall Ronnald Ramp), PA 2. Dr. Amalia Greenhouse 3. Dr. Marsh Dolly. [facility] [phone]) 1. Dr. Glade Lloyd [facility] [phone]) 1. Dr. Orville Govern 2. Dr. Lelon Huh 3. Dr. Jerelene Redden 4. Alwyn Ren, FNP Dr. Carolynn Serve. [place] in [place] [phone])

## 2019-05-20 NOTE — Progress Notes (Addendum)
Family Medicine Teaching Service Daily Progress Note Intern Pager: [phone]  Patient name: Jeremiah [name] Medical record number: [id number] Date of birth: 09-05-1965 Age: 53 y.o. Gender: male  Primary Care Provider: Gladys Damme, MD Consultants: Nephrology, IR Code Status: Full  Pt Overview and Major Events to Date:  05/16/2019: admitted for volume overload, TDC placed 05/17/2019: HD start   Assessment and Plan: YUYA VANWINGERDEN is a 53 y.o. male presenting with ongoing bilateral lower extremity and penile edema. 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 Plan to have HD today.  LE edema resolved. Patient is tolerating HD well.  Patient diuresed 1.4L yesterday.  Weight is down ~3.5 kg since admission.  BUN 60 and Cr 7.49.  Patient has maturing RUE fistula however has temporary dialysis catheter.  - Nephrology following, appreciate recommendations  - AM CBC, renal function panel  - Daily weights  - Strict Is/Os  - Consider repeat ECHO   Uncontrolled HTN  BP hypertensive today 220/91. Home meds include: Amlodipine 10mg  QD, Hydralazine 25mg  TID, Coreg 12.5mg  BID. Improves with HD. - Continue home meds - Monitor blood pressure - Start 50 mg Losartan   Uncontrolled T2DM  Fasting CBG 85 today.  Patient hyperglycemia at 601 overnight.  Patient denies drinking juice yesterday.  Patient received 12 units lantis and 13 units sliding scale insulin.  A1c 10.8 on admission.   - Increase lantus to 12 units qHS - continue sSSI and qHS sliding scale  - monitor CBG's  - consider meal time coverage  HLD Last lipid panel on 01/20/2018: Cholesterol 227, triglycerides 150, HDL 36, LDL 163.  Home medications include Lipitor 40 mg and 81 mg aspirin daily. -Continue home medication  Anemia  Etiology likely multifactorial; anemia of chronic disease as well as iron deficiency anemia.   Hemoglobin 9.5 today from 8.8.  Baseline appears to be ~8.5-9.  Home medications include Fe sulfate 325 mg BID. Nephrology started ESA (Aranesp 60 mcg) .  - daily CBC - Continue iron supplement - Aranesp 60 mcg   Diabetic Neuropathy  L foot wounds possible PVD Repeat ABIs pending . Dry gangernous toes on left foot exam. Patient stated he could not feel the hot water hit his feet in the shower.  Hx of peripheral neuropathy.   Appears patient had borderline left ABI's in March 2020 at 0.96. - continue wound care - wound care consulted, appreciate recs - follow up ABI's, consider VVS consult pending result   Chronic Pain  Osteoarthritis  Prescribed Percocet 10 mg every 6 hours as needed. - Hold home Percocet - Norco Q6H PRN  FEN/GI: renal/carb modified with fluid restriction Prophylaxis: Heparin   Disposition: continue HD, awaiting CLIP placement  Subjective:  [name] had hyperglycemia overnight.  Patient without complaints this morning.  Objective: Temp:  [98.2 F (36.8 C)-98.6 F (37 C)] 98.4 F (36.9 C) (11/23 0525) Pulse Rate:  [73-85] 73 (11/23 0525) Resp:  [16-18] 16 (11/23 0525) BP: (157-193)/(75-89) 178/87 (11/23 0525) SpO2:  [96 %-100 %] 99 % (11/23 0525) Weight:  [80 kg] 80 kg (11/23 0525)   Physical Exam:  GEN: Well-nourished well-developed, in no acute distress CV: regular rate and rhythm, no murmurs appreciated, RUE fistula, temporary dialysis catheter in place RESP: no increased work of breathing, clear to ascultation bilaterally  ABD: Bowel sounds present. Soft, Nontender, Nondistended. MSK: no lower extremity edema SKIN: warm, dry,  left foot wound 2nd -4th toes NEURO: Alert and oriented, moves all extremities appropriately    Laboratory: Recent Labs  Lab 05/17/19 0602 05/18/19 0528 05/19/19 0452  WBC 4.4 5.5 5.2  HGB 8.9* 8.5* 8.8*  HCT 27.8* 26.9* 28.8*  PLT 215 196 214   Recent Labs  Lab 05/17/19 0602 05/18/19 0528 05/19/19  0452 05/19/19 2002  NA 137 137 137  --   K 4.2 3.8 4.3  --   CL 106 106 104  --   CO2 18* 21* 25  --   BUN 97* 67* 42*  --   CREATININE 11.19* 8.33* 5.78*  --   CALCIUM 6.6* 7.3* 7.8*  --   GLUCOSE 110* 118* 158* 601*    Imaging/Diagnostic Tests: No results found.  Lyndee Hensen, DO PGY-1, [facility] 05/20/2019 7:59 AM    FPTS Intern pager: [phone], text pages welcome

## 2019-05-20 NOTE — Plan of Care (Signed)
  Problem: Education: Goal: Knowledge of General Education information will improve Description: Including pain rating scale, medication(s)/side effects and non-pharmacologic comfort measures Outcome: Adequate for Discharge   Problem: Health Behavior/Discharge Planning: Goal: Ability to manage health-related needs will improve Outcome: Adequate for Discharge   Problem: Clinical Measurements: Goal: Ability to maintain clinical measurements within normal limits will improve Outcome: Adequate for Discharge Goal: Will remain free from infection Outcome: Adequate for Discharge Goal: Diagnostic test results will improve Outcome: Adequate for Discharge Goal: Respiratory complications will improve Outcome: Adequate for Discharge Goal: Cardiovascular complication will be avoided Outcome: Adequate for Discharge   Problem: Activity: Goal: Risk for activity intolerance will decrease Outcome: Adequate for Discharge   Problem: Nutrition: Goal: Adequate nutrition will be maintained Outcome: Adequate for Discharge   Problem: Pain Managment: Goal: General experience of comfort will improve Outcome: Adequate for Discharge   Problem: Skin Integrity: Goal: Risk for impaired skin integrity will decrease Outcome: Adequate for Discharge   Problem: Education: Goal: Knowledge of disease and its progression will improve Outcome: Adequate for Discharge   Problem: Clinical Measurements: Goal: Dialysis access will remain free of complications Outcome: Adequate for Discharge

## 2019-05-20 NOTE — Progress Notes (Signed)
ABI has been completed.   Preliminary results in CV Proc.   Jeremiah [name] 05/20/2019 11:33 AM

## 2019-05-20 NOTE — Progress Notes (Signed)
[facility] HPI: 53 y/o male Seen on 05/13/2019 by Dr. Oneida Alar for CKD stage V and creation of Right Brachial Cephalic AV fistula.  He was discharged after surgery same day.  He Returned to the ED on 05/16/19 with acute edema in B LE.  He is now in ESRD with TDC placed by IR.    Today we have been asked to see the patient in regards to left foot toes injuries.  He states he burned his third and forth  toes while in the shower a few weeks ago. He has DM with chronic peripheral neuropathy and has decreased sensation in B hands and feet.  He denise history of non healing wounds.  He states he has anterior leg pain with ambulation from the knees down, but denise calf pain.    Past medical history includes HTN, CKD and DM    Objective (!) 168/77 80 98.2 F (36.8 C) (Oral) 15 99%  Intake/Output Summary (Last 24 hours) at 05/20/2019 1621 Last data filed at 05/20/2019 0900 Gross per 24 hour  Intake 300 ml  Output 1400 ml  Net -1100 ml    Lungs non labored breathing Left UE well healing AC incision with palpable thrill in fistula.  Left radial pulse palpable, sensation and motor grossly intact. Palpable B femoral, popliteal pulses.  Palpable right DP/PT pulses, no pedal  pulses on the left.  Left foot is warm and there is granulation tissue over the wounds on the dorsum of the 3erd and 4th toes.  No sign of gangrene.  No erythema or drainage.   Abdomin soft NNTP Heart RRR  ABI's    ABI Findings: +--------+------------------+-----+---------+----------------------------------+ Right   Rt Pressure (mmHg)IndexWaveform Comment                            +--------+------------------+-----+---------+----------------------------------+ Brachial                       triphasicunable to obtain bp due to fistula +--------+------------------+-----+---------+----------------------------------+ PTA     205               1.13 triphasic                                    +--------+------------------+-----+---------+----------------------------------+ DP      137               0.76 triphasic                                   +--------+------------------+-----+---------+----------------------------------+  +--------+------------------+-----+----------+-------+ Left    Lt Pressure (mmHg)IndexWaveform  Comment +--------+------------------+-----+----------+-------+ [id number]                    triphasic         +--------+------------------+-----+----------+-------+ PTA     149               0.82 biphasic          +--------+------------------+-----+----------+-------+ DP      130               0.72 monophasic        +--------+------------------+-----+----------+-------+  +-------+-----------+-----------+------------+------------+ ABI/TBIToday's ABIToday's TBIPrevious ABIPrevious TBI +-------+-----------+-----------+------------+------------+ Right  1.13                                           +-------+-----------+-----------+------------+------------+  Left   0.82                                           +-------+-----------+-----------+------------+------------+   Assessment/Planning: S/P left AV fistula creation Now on HD via TDC placed by IR Left LE mild tibial disease with healing wounds on the 3erd and 4th toes.  ABI's demonstrate adequate arterial blood flow to heal his wounds.    WOC RN recommended  Bactroban to left toe wounds Q day, then cover with foam dressing.  (Change foam dressing Q 3 days or PRN soiling. He will f/u in 5-6 weeks for left UE fistula duplex and left foot wound check.  He will call if he develops rest pain and his wounds do not continue to heal.  Roxy Horseman 05/20/2019 4:21 PM --  Laboratory Lab Results: Recent Labs    05/19/19 0452 05/20/19 0838  WBC 5.2 6.5  HGB 8.8* 9.5*  HCT 28.8* 31.5*  PLT 214 296   BMET Recent Labs     05/19/19 0452 05/19/19 2002 05/20/19 0838  NA 137  --  139  K 4.3  --  4.6  CL 104  --  101  CO2 25  --  29  GLUCOSE 158* 601* 133*  BUN 42*  --  60*  CREATININE 5.78*  --  7.49*  CALCIUM 7.8*  --  8.9    COAG Lab Results  Component Value Date   INR 1.0 05/16/2019   INR 1.02 01/19/2018   No results found for: PTT

## 2019-05-20 NOTE — Progress Notes (Signed)
Jeremiah [name] to be discharged home per MD order. Patient took out his IV, and left without receiving his AVS packet. Patient walked out with his fiance which was his ride to go home.  [name], RN

## 2019-05-20 NOTE — Progress Notes (Signed)
Inpatient Diabetes Program Recommendations  AACE/ADA: New Consensus Statement on Inpatient Glycemic Control (2015)  Target Ranges:  Prepandial:   less than 140 mg/dL      Peak postprandial:   less than 180 mg/dL (1-2 hours)      Critically ill patients:  140 - 180 mg/dL   Lab Results  Component Value Date   GLUCAP 136 (H) 05/20/2019   HGBA1C 10.8 (H) 05/16/2019    Review of Glycemic Control Results for TAVARIUS, Jeremiah [name] (MRN [id number]) as of 05/20/2019 13:30  Ref. Range 05/19/2019 22:46 05/20/2019 01:04 05/20/2019 06:52 05/20/2019 11:20  Glucose-Capillary Latest Ref Range: 70 - 99 mg/dL 484 (H) 380 (H) 85 136 (H)   Diabetes history: DM 2 Outpatient Diabetes medications:  Humalog 3-12 units tid with meals, Lantus 10 units q AM and 5-10 units Q PM (based on blood sugar level) Current orders for Inpatient glycemic control:  Lantus 12 units q HS, Novolog sensitive tid with meals Inpatient Diabetes Program Recommendations:   Please add Novolog meal coverage 2 units tid with meals (Hold if patient eats less than 50%).   Thanks  Adah Perl, RN, BC-ADM Inpatient Diabetes Coordinator Pager [phone] (8a-5p)

## 2019-05-21 ENCOUNTER — Telehealth: Payer: Self-pay | Admitting: Nephrology

## 2019-05-21 NOTE — Telephone Encounter (Signed)
Transition of Care Contact from [facility]  Date of Discharge: 05/20/2019 Date of Contact: 05/21/2019 Method of contact: phone  Attempted to contact patient to discuss transition of care from inpatient admission due to CKD 5>ESRD in need of dialysis. Patient family member answered the phone and stated he was not home he was at dialysis.  Will attempt to contact again tomorrow or possibly at dialysis.   Jen Mow, PA-C [facility] Pager: [phone]

## 2019-06-17 ENCOUNTER — Emergency Department (HOSPITAL_COMMUNITY)
Admission: EM | Admit: 2019-06-17 | Discharge: 2019-06-17 | Disposition: A | Discharge status: Home / Self Care | Payer: Medicaid Other | Attending: Emergency Medicine | Admitting: Emergency Medicine

## 2019-06-17 ENCOUNTER — Other Ambulatory Visit: Payer: Self-pay

## 2019-06-17 ENCOUNTER — Emergency Department (HOSPITAL_COMMUNITY): Payer: Medicaid Other

## 2019-06-17 ENCOUNTER — Encounter (HOSPITAL_COMMUNITY): Payer: Self-pay | Admitting: Emergency Medicine

## 2019-06-17 DIAGNOSIS — Z794 Long term (current) use of insulin: Secondary | ICD-10-CM | POA: Diagnosis not present

## 2019-06-17 DIAGNOSIS — Z79899 Other long term (current) drug therapy: Secondary | ICD-10-CM | POA: Insufficient documentation

## 2019-06-17 DIAGNOSIS — Z992 Dependence on renal dialysis: Secondary | ICD-10-CM | POA: Insufficient documentation

## 2019-06-17 DIAGNOSIS — U071 COVID-19: Secondary | ICD-10-CM | POA: Insufficient documentation

## 2019-06-17 DIAGNOSIS — I120 Hypertensive chronic kidney disease with stage 5 chronic kidney disease or end stage renal disease: Secondary | ICD-10-CM | POA: Diagnosis not present

## 2019-06-17 DIAGNOSIS — E1122 Type 2 diabetes mellitus with diabetic chronic kidney disease: Secondary | ICD-10-CM | POA: Diagnosis not present

## 2019-06-17 DIAGNOSIS — Z8673 Personal history of transient ischemic attack (TIA), and cerebral infarction without residual deficits: Secondary | ICD-10-CM | POA: Insufficient documentation

## 2019-06-17 DIAGNOSIS — N186 End stage renal disease: Secondary | ICD-10-CM | POA: Diagnosis not present

## 2019-06-17 DIAGNOSIS — E1140 Type 2 diabetes mellitus with diabetic neuropathy, unspecified: Secondary | ICD-10-CM | POA: Insufficient documentation

## 2019-06-17 DIAGNOSIS — R509 Fever, unspecified: Secondary | ICD-10-CM | POA: Diagnosis present

## 2019-06-17 LAB — COMPREHENSIVE METABOLIC PANEL
ALT: 11 U/L (ref 0–44)
AST: 21 U/L (ref 15–41)
Albumin: 2.5 g/dL — ABNORMAL LOW (ref 3.5–5.0)
Alkaline Phosphatase: 58 U/L (ref 38–126)
Anion gap: 11 (ref 5–15)
BUN: 25 mg/dL — ABNORMAL HIGH (ref 6–20)
CO2: 28 mmol/L (ref 22–32)
Calcium: 7.5 mg/dL — ABNORMAL LOW (ref 8.9–10.3)
Chloride: 95 mmol/L — ABNORMAL LOW (ref 98–111)
Creatinine, Ser: 5.82 mg/dL — ABNORMAL HIGH (ref 0.61–1.24)
GFR calc Af Amer: 12 mL/min — ABNORMAL LOW (ref >60)
GFR calc non Af Amer: 10 mL/min — ABNORMAL LOW (ref >60)
Glucose, Bld: 314 mg/dL — ABNORMAL HIGH (ref 70–99)
Potassium: 3.3 mmol/L — ABNORMAL LOW (ref 3.5–5.1)
Sodium: 134 mmol/L — ABNORMAL LOW (ref 135–145)
Total Bilirubin: 0.4 mg/dL (ref 0.3–1.2)
Total Protein: 6 g/dL — ABNORMAL LOW (ref 6.5–8.1)

## 2019-06-17 LAB — CBC WITH DIFFERENTIAL/PLATELET
Abs Immature Granulocytes: 0.02 10*3/uL (ref 0.00–0.07)
Basophils Absolute: 0 10*3/uL (ref 0.0–0.1)
Basophils Relative: 0 %
Eosinophils Absolute: 0 10*3/uL (ref 0.0–0.5)
Eosinophils Relative: 0 %
HCT: 31.2 % — ABNORMAL LOW (ref 39.0–52.0)
Hemoglobin: 9.5 g/dL — ABNORMAL LOW (ref 13.0–17.0)
Immature Granulocytes: 1 %
Lymphocytes Relative: 11 %
Lymphs Abs: 0.4 10*3/uL — ABNORMAL LOW (ref 0.7–4.0)
MCH: 23.4 pg — ABNORMAL LOW (ref 26.0–34.0)
MCHC: 30.4 g/dL (ref 30.0–36.0)
MCV: 76.8 fL — ABNORMAL LOW (ref 80.0–100.0)
Monocytes Absolute: 0.4 10*3/uL (ref 0.1–1.0)
Monocytes Relative: 10 %
Neutro Abs: 3 10*3/uL (ref 1.7–7.7)
Neutrophils Relative %: 78 %
Platelets: 247 10*3/uL (ref 150–400)
RBC: 4.06 MIL/uL — ABNORMAL LOW (ref 4.22–5.81)
RDW: 16.4 % — ABNORMAL HIGH (ref 11.5–15.5)
WBC: 3.8 10*3/uL — ABNORMAL LOW (ref 4.0–10.5)
nRBC: 0 % (ref 0.0–0.2)

## 2019-06-17 LAB — LIPASE, BLOOD: Lipase: 73 U/L — ABNORMAL HIGH (ref 11–51)

## 2019-06-17 LAB — LACTIC ACID, PLASMA: Lactic Acid, Venous: 1.1 mmol/L (ref 0.5–1.9)

## 2019-06-17 LAB — POC SARS CORONAVIRUS 2 AG -  ED: SARS Coronavirus 2 Ag: POSITIVE — AB

## 2019-06-17 MED ORDER — ACETAMINOPHEN 325 MG PO TABS
650.0000 mg | ORAL_TABLET | Freq: Once | ORAL | Status: AC
  Administered 2019-06-17: 650 mg via ORAL

## 2019-06-17 NOTE — Discharge Instructions (Addendum)
You were diagnosed with Covid19 today. Please be sure to quarantine yourself and stay away from others so you do not spread it. Call dialysis and let them know you are positive. They will still be able to schedule you for your regular dialysis but need to know ahead of time. Stay hydrated and return to the ER if you feel severely short of breath or have severe symptoms. Take tylenol for fever and body aches. Your chest xray and labs were very reassuring. Thank you for allowing me to care for you today. I hope you have a happy birthday!! Please return to the emergency department if you have new or worsening symptoms.

## 2019-06-17 NOTE — ED Triage Notes (Signed)
Dialysis patient MWF, finished his treatment today but continued to have a fever with chills, diarrhea x3 and shortness of breath when ambulating. He denies contact with Covid but reports his BP was elevated so he went home and took his meds. Pt is alert and oriented x4. Tylenol to be gi en in triage.
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
## 2019-06-18 ENCOUNTER — Telehealth: Payer: Self-pay | Admitting: Unknown Physician Specialty

## 2019-06-18 NOTE — Telephone Encounter (Signed)
Called to discuss with patient about Covid symptoms and the use of bamlanivimab, a monoclonal antibody infusion for those with mild to moderate Covid symptoms and at a high risk of hospitalization.  Pt is qualified for this infusion at the [facility] center due to Diabetes   Message left to call back  

## 2019-06-22 LAB — CULTURE, BLOOD (ROUTINE X 2)
Culture: NO GROWTH
Special Requests: ADEQUATE

## 2019-07-03 ENCOUNTER — Emergency Department (HOSPITAL_COMMUNITY)
Admission: EM | Admit: 2019-07-03 | Discharge: 2019-07-04 | Disposition: A | Discharge status: Home / Self Care | Payer: Medicaid Other | Attending: Emergency Medicine | Admitting: Emergency Medicine

## 2019-07-03 ENCOUNTER — Encounter (HOSPITAL_COMMUNITY): Payer: Self-pay | Admitting: Emergency Medicine

## 2019-07-03 ENCOUNTER — Other Ambulatory Visit: Payer: Self-pay

## 2019-07-03 ENCOUNTER — Emergency Department (HOSPITAL_COMMUNITY): Payer: Medicaid Other

## 2019-07-03 DIAGNOSIS — Z79899 Other long term (current) drug therapy: Secondary | ICD-10-CM | POA: Diagnosis not present

## 2019-07-03 DIAGNOSIS — Z7982 Long term (current) use of aspirin: Secondary | ICD-10-CM | POA: Insufficient documentation

## 2019-07-03 DIAGNOSIS — Z794 Long term (current) use of insulin: Secondary | ICD-10-CM | POA: Diagnosis not present

## 2019-07-03 DIAGNOSIS — E1122 Type 2 diabetes mellitus with diabetic chronic kidney disease: Secondary | ICD-10-CM | POA: Diagnosis not present

## 2019-07-03 DIAGNOSIS — I129 Hypertensive chronic kidney disease with stage 1 through stage 4 chronic kidney disease, or unspecified chronic kidney disease: Secondary | ICD-10-CM | POA: Insufficient documentation

## 2019-07-03 DIAGNOSIS — M79631 Pain in right forearm: Secondary | ICD-10-CM | POA: Diagnosis not present

## 2019-07-03 DIAGNOSIS — N184 Chronic kidney disease, stage 4 (severe): Secondary | ICD-10-CM | POA: Insufficient documentation

## 2019-07-03 DIAGNOSIS — Z041 Encounter for examination and observation following transport accident: Secondary | ICD-10-CM | POA: Diagnosis present

## 2019-07-03 DIAGNOSIS — M542 Cervicalgia: Secondary | ICD-10-CM | POA: Insufficient documentation

## 2019-07-03 MED ORDER — HYDROCODONE-ACETAMINOPHEN 5-325 MG PO TABS
1.0000 | ORAL_TABLET | Freq: Once | ORAL | Status: AC
  Administered 2019-07-03: 1 via ORAL
  Filled 2019-07-03: qty 1

## 2019-07-03 MED ORDER — INSULIN ASPART 100 UNIT/ML ~~LOC~~ SOLN
10.0000 [IU] | Freq: Once | SUBCUTANEOUS | Status: AC
  Administered 2019-07-04: 10 [IU] via SUBCUTANEOUS

## 2019-07-03 MED ORDER — ONDANSETRON 4 MG PO TBDP
4.0000 mg | ORAL_TABLET | Freq: Once | ORAL | Status: AC | PRN
  Administered 2019-07-04: 4 mg via ORAL
  Filled 2019-07-03: qty 1

## 2019-07-03 NOTE — ED Notes (Signed)
Pt with approx 800 ml emesis at CT. Pt brought back to ED, CT advised this RN to call back for CT when pt is not vomiting.

## 2019-07-03 NOTE — ED Notes (Signed)
Pt transported to CT 3
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
## 2019-07-04 ENCOUNTER — Emergency Department (HOSPITAL_COMMUNITY): Payer: Medicaid Other

## 2019-07-04 LAB — CBG MONITORING, ED
Glucose-Capillary: 473 mg/dL — ABNORMAL HIGH (ref 70–99)
Glucose-Capillary: >600 mg/dL (ref 70–99)

## 2019-07-04 MED ORDER — CYCLOBENZAPRINE HCL 10 MG PO TABS: 10.0000 mg | ORAL_TABLET | Freq: Two times a day (BID) | ORAL | 0 refills | Status: DC | PRN

## 2019-07-04 MED ORDER — IBUPROFEN 600 MG PO TABS: 600.0000 mg | ORAL_TABLET | Freq: Four times a day (QID) | ORAL | 0 refills | Status: DC | PRN

## 2019-07-04 MED ORDER — AMLODIPINE BESYLATE 10 MG PO TABS
10.0000 mg | ORAL_TABLET | Freq: Once | ORAL | Status: AC
  Administered 2019-07-04: 02:00:00 10 mg via ORAL
  Filled 2019-07-04: qty 1
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
## 2019-07-10 ENCOUNTER — Other Ambulatory Visit: Payer: Self-pay | Admitting: Family Medicine

## 2019-07-18 ENCOUNTER — Encounter: Payer: Self-pay | Admitting: Family Medicine

## 2019-07-18 ENCOUNTER — Other Ambulatory Visit: Payer: Self-pay

## 2019-07-18 ENCOUNTER — Ambulatory Visit (INDEPENDENT_AMBULATORY_CARE_PROVIDER_SITE_OTHER): Payer: Medicaid Other | Admitting: Family Medicine

## 2019-07-18 DIAGNOSIS — S161XXA Strain of muscle, fascia and tendon at neck level, initial encounter: Secondary | ICD-10-CM | POA: Insufficient documentation

## 2019-07-18 DIAGNOSIS — E1165 Type 2 diabetes mellitus with hyperglycemia: Secondary | ICD-10-CM

## 2019-07-18 DIAGNOSIS — E1142 Type 2 diabetes mellitus with diabetic polyneuropathy: Secondary | ICD-10-CM | POA: Diagnosis not present

## 2019-07-18 DIAGNOSIS — M25531 Pain in right wrist: Secondary | ICD-10-CM | POA: Diagnosis not present

## 2019-07-18 MED ORDER — GABAPENTIN 100 MG PO CAPS: 100.0000 mg | ORAL_CAPSULE | Freq: Three times a day (TID) | ORAL | 3 refills | Status: DC
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
## 2019-07-18 NOTE — Assessment & Plan Note (Signed)
Continue to work with dr. Belenda Cruise.  The gabapentin may help.
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
## 2019-07-18 NOTE — Assessment & Plan Note (Signed)
Work with Dr. Valentina Lucks to improve poorly controled DM.  FU one week.

## 2019-07-18 NOTE — Patient Instructions (Addendum)
Please keep working with the chiropractor, D. Cobb, with the neck and shoulder problems.  I will call him to tell him about this visit You have some degenerative changes in that shoulder.  You may have some lingering problems. Get with Dr. Valentina Lucks to keep bringing the diabetes under better control. I will call with x ray results. I started you on a medicine for diabetic neuropathy.
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
## 2019-07-22 ENCOUNTER — Ambulatory Visit: Payer: Medicaid Other | Admitting: Student in an Organized Health Care Education/Training Program

## 2019-07-23 ENCOUNTER — Ambulatory Visit (INDEPENDENT_AMBULATORY_CARE_PROVIDER_SITE_OTHER): Payer: Medicaid Other | Admitting: Physician Assistant

## 2019-07-23 ENCOUNTER — Other Ambulatory Visit: Payer: Self-pay

## 2019-07-23 VITALS — BP 164/90 | HR 83 | Temp 95.0°F | Ht 71.5 in | Wt 171.0 lb

## 2019-07-23 DIAGNOSIS — N184 Chronic kidney disease, stage 4 (severe): Secondary | ICD-10-CM

## 2019-07-23 NOTE — Progress Notes (Deleted)
POST OPERATIVE OFFICE NOTE    CC:  F/u for surgery  HPI:  This is a 54 y.o. male who is s/p ***  No Known Allergies  Current Outpatient Medications  Medication Sig Dispense Refill  . Alcohol Swabs (ALCOHOL PADS) 70 % PADS 1 each by Does not apply route 4 (four) times daily as needed. 1 each 3  . amLODipine (NORVASC) 10 MG tablet TAKE 1 TABLET BY MOUTH ONCE DAILY (Patient taking differently: Take 10 mg by mouth daily. ) 90 tablet 0  . aspirin EC 81 MG tablet Take 1 tablet (81 mg total) by mouth daily. 90 tablet 2  . atorvastatin (LIPITOR) 40 MG tablet Take 1 tablet (40 mg total) by mouth daily. (Patient not taking: Reported on 05/16/2019) 90 tablet 3  . blood glucose meter kit and supplies KIT Dispense based on patient and insurance preference. Use up to four times daily as directed. (FOR ICD-9 250.00, 250.01). 1 each 6  . calcitRIOL (ROCALTROL) 0.25 MCG capsule Take 1 capsule (0.25 mcg total) by mouth daily. (Patient taking differently: Take 1 mcg by mouth daily. ) 30 capsule 0  . calcium carbonate (TUMS EX) 750 MG chewable tablet Chew 1 tablet by mouth See admin instructions. Chew 1 tablet by mouth one to two times a day    . carvedilol (COREG) 12.5 MG tablet Take 1 tablet (12.5 mg total) by mouth 2 (two) times daily with a meal. 60 tablet 0  . cyclobenzaprine (FLEXERIL) 10 MG tablet Take 1 tablet (10 mg total) by mouth 2 (two) times daily as needed for muscle spasms. 20 tablet 0  . Darbepoetin Alfa (ARANESP) 60 MCG/0.3ML SOSY injection Inject 0.3 mLs (60 mcg total) into the vein every Thursday with hemodialysis. 4.2 mL   . ferrous sulfate 325 (65 FE) MG tablet Take 1 tablet (325 mg total) by mouth 2 (two) times daily with a meal. (Patient not taking: Reported on 05/16/2019) 60 tablet 0  . gabapentin (NEURONTIN) 100 MG capsule Take 1 capsule (100 mg total) by mouth 3 (three) times daily. 90 capsule 3  . glucose blood (ACCU-CHEK AVIVA PLUS) test strip Use as instructed 100 each 12  .  hydrALAZINE (APRESOLINE) 25 MG tablet Take 25 mg by mouth See admin instructions. Take 25 mg by mouth two to three times a day    . ibuprofen (ADVIL) 600 MG tablet Take 1 tablet (600 mg total) by mouth every 6 (six) hours as needed. 30 tablet 0  . insulin lispro (HUMALOG KWIKPEN) 100 UNIT/ML KwikPen Inject 0.05 mLs (5 Units total) into the skin 3 (three) times daily. Increase as instructed. Max daily dose 30 units (Patient taking differently: Inject 3-12 Units into the skin daily as needed (if BGL is 300 or greater). ) 12 mL 11  . Insulin Pen Needle (PEN NEEDLES) 32G X 4 MM MISC 100 pens by Does not apply route 4 (four) times daily. Use to inject insulin four times daily 100 each 2  . Lancets (ACCU-CHEK MULTICLIX) lancets Use as instructed 100 each 12  . Lancets Misc. (ACCU-CHEK MULTICLIX LANCET DEV) KIT 1 each by Does not apply route 4 (four) times daily as needed. 1 each 3  . LANTUS SOLOSTAR 100 UNIT/ML Solostar Pen INJECT 10 UNITS SUBCUTANEOUSLY ONCE DAILY (Patient taking differently: Inject 5-10 Units into the skin See admin instructions. Inject 10 units into the skin in the morning and may inject 5-10 units at night as needed for a BGL of 200 or greater) 15 mL  0  . LOKELMA 5 g packet Take 5 g by mouth daily. [place]    . losartan (COZAAR) 50 MG tablet Take 1 tablet by mouth once daily 30 tablet 3  . polyethylene glycol (MIRALAX / GLYCOLAX) 17 g packet Take 17 g by mouth daily as needed for moderate constipation. 14 each 0   No current facility-administered medications for this visit.     ROS:  See HPI  Physical Exam:  ***  Incision:  *** Extremities:  *** Neuro: *** Abdomen:  ***  Assessment/Plan:  This is a 54 y.o. male who is s/p: ***  -***   Leontine Locket, PA-C [facility] [phone]  Clinic MD:  ***
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
## 2019-08-15 ENCOUNTER — Other Ambulatory Visit: Payer: Self-pay

## 2019-08-15 ENCOUNTER — Encounter (HOSPITAL_COMMUNITY): Payer: Self-pay

## 2019-08-15 ENCOUNTER — Ambulatory Visit (HOSPITAL_COMMUNITY)
Admission: EM | Admit: 2019-08-15 | Discharge: 2019-08-15 | Disposition: A | Discharge status: Home / Self Care | Payer: Medicare Other | Attending: Physician Assistant | Admitting: Physician Assistant

## 2019-08-15 DIAGNOSIS — N185 Chronic kidney disease, stage 5: Secondary | ICD-10-CM | POA: Diagnosis not present

## 2019-08-15 DIAGNOSIS — S161XXD Strain of muscle, fascia and tendon at neck level, subsequent encounter: Secondary | ICD-10-CM

## 2019-08-15 DIAGNOSIS — M25512 Pain in left shoulder: Secondary | ICD-10-CM | POA: Diagnosis not present

## 2019-08-15 MED ORDER — CYCLOBENZAPRINE HCL 5 MG PO TABS: 5.0000 mg | ORAL_TABLET | Freq: Two times a day (BID) | ORAL | 0 refills | Status: DC

## 2019-08-15 MED ORDER — TRAMADOL HCL 50 MG PO TABS: 25.0000 mg | ORAL_TABLET | Freq: Every day | ORAL | 0 refills | Status: AC

## 2019-08-15 NOTE — Discharge Instructions (Addendum)
Please schedule an appointment to be seen by your primary care provider to discuss medication refills and follow-up on today's appointment, as well as discuss further your concerns from your chiropractor.  I have also supplied information for the sports medicine office in [place] please call them to discuss an appointment for further evaluation of your neck and shoulder pain.  I have prescribed Flexeril 5 mg and I would like for you to take this up to twice a day.  This medication may make you slightly drowsy.  I have also sent in a medication called tramadol I want you to take one half of a tablet at night only.  This medication may make you drowsy and should help you sleep and alleviate some of your pain.  I have also attached mental health resources for [facility].  Please call one of these numbers to discuss establishing with a therapist.  If you were to develop feelings of self-harm or wanting to harm someone else please either call the suicide prevention hotline or go to the emergency department. [phone]
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
## 2019-08-19 ENCOUNTER — Other Ambulatory Visit: Payer: Self-pay | Admitting: Family Medicine

## 2019-08-19 DIAGNOSIS — E1165 Type 2 diabetes mellitus with hyperglycemia: Secondary | ICD-10-CM

## 2019-08-23 ENCOUNTER — Telehealth: Payer: Self-pay | Admitting: Family Medicine

## 2019-08-23 ENCOUNTER — Telehealth (HOSPITAL_COMMUNITY): Payer: Self-pay
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
## 2019-08-23 NOTE — Telephone Encounter (Signed)
Pt is calling for referral to [facility]. Fax number is: [phone] ATTN: Jonelle Sidle

## 2019-08-23 NOTE — Telephone Encounter (Signed)
Will forward to MD.  Patient contacted urgent care first for this referral was told he needed to get it from [place]. Rayleen Wyrick,CMA

## 2019-08-23 NOTE — Telephone Encounter (Signed)
Patient called ED and then UC for referral to behavioral health. This problem was not discussed at most recent visit to clinic (or any prior visit) on 1/21. Patient briefly mentioned this to ED provider in most recent note 2/18, he was given resources and denied passive/active SI. As this is a new problem, he needs an appointment to best assess needs, possible diagnoses. He does have a list of community resources per ED note.  Gladys Damme, MD [facility], PGY-1

## 2019-08-26 NOTE — Telephone Encounter (Signed)
LM for patient to call back and please schedule an appointment to discuss this referral.  Jazmin Hartsell,CMA

## 2019-08-30 NOTE — Telephone Encounter (Signed)
Patient calls nurse line returning [place] call. I advised patient his PCP has availability 3/12, however patient has dialysis MWF. With that being the case and patient not wanting to wait until the end of March for PCP, scheduled with team.

## 2019-09-02 NOTE — Patient Instructions (Addendum)
Thank you for coming in to see [place] today! Please see below to review our plan for today's visit:  1. High blood pressure: take your medications every day.  Be sure to check your blood pressure at home, write down your blood pressures daily.  Follow-up with [place] for this in 2 weeks. 2.  Diabetes: Your A1c has improved from 10.8% down to 10.1%.  Although this is an improvement, this is still very elevated.  Continue to take your Lantus and Humalog as prescribed, we can follow-up with this in 2 weeks. 3.  Shoulder/neck: I have attached some shoulder exercises and stretches for you to do daily.  I recommend warm compresses and cooling pads to the area to help relieve pain.  Continue to take Tylenol 500 mg every 4-6 hours as needed for pain.  Continue to take gabapentin 100 mg every evening to help you sleep and to help relieve pain.  I am also prescribing duloxetine which also helps with pain. 4.  Mood/depression: I am prescribing you duloxetine/Cymbalta 20 mg to be taken once daily.  I am placing a referral for you for talk therapy. 5.  Sleep: If we can get your pain and depression under control I believe these will help your mood!  Taking gabapentin at night before bedtime for pain will help you sleep.  I also recommend using good sleep hygiene and melatonin 5 mg every evening.  Please call the clinic at [phone] if your symptoms worsen or you have any concerns. It was our pleasure to serve you!  Dr. Milus Banister [facility]   When you're going through tough times, it's easy to feel lonely and overwhelmed. Remember that YOU ARE NOT ALONE and we at [facility] want to support you during this difficult time.  There are many ways to get help and support when you are ready.  You can call the following help lines: - National suicide hotline 551-562-9180) - [place] (1-800-SUICIDE)  You can schedule an appointment with a team member at [facility] - your primary care doc or one of our counselors.  We are all here to support you. A doctor is on call 24/7, so call us if you need to [phone]).  There are many treatments that can help people during difficult times, and we can talk with you about medications, counseling, diet, and other options. We want to offer you HOPE that it won't always feel this bad.  If you are seriously thinking about hurting yourself or have a plan, please call 911 or go to any emergency room right away for immediate help.  Psychiatry and [place], [place] [phone]  Psychiatrists [facility]  [facility] [address]   [address], [place], [place] [address]   [place], [place] [address] [phone]     [phone]  Dr. Norma Fredrickson   [facility] [address]  [place] [address]  [place] [address] [phone]    [phone]  Sheralyn Boatman, [place] [address]  [place] [address]  [place] [address] [phone]    [phone]  [facility]rapists [facility]  [facility] [address] [place]    [place], [place]     [facility]  [facility] [address]      [address] [place] [address]     [place], [place]       [phone]  [facility]  [facility] [address]   [address], [place], [place] [address]   [place], [place] [address] [phone]     [phone]  [facility]  [facility] [address]    [place], [place] [address]    [place], [place] [address] [phone]            [phone]   Shoulder Impingement Syndrome Rehab Ask your health care provider which exercises are safe for you. Do exercises exactly as told by your health care provider and adjust them as directed. It is normal to feel mild stretching, pulling, tightness, or discomfort as you do these exercises. Stop right away if you feel sudden pain or your pain gets worse. Do not begin these exercises until told by your health care provider. Stretching and range-of-motion exercise This exercise warms up your muscles and joints and improves the movement and flexibility of your shoulder. This exercise also helps to relieve pain and stiffness. Passive horizontal adduction In passive adduction, you use your other hand to move the injured arm toward your body. The injured arm does not move on its own. In this movement, your arm is moved across your body in the horizontal plane (horizontal adduction). 1. Sit or stand and pull your left / right elbow across your chest, toward your other shoulder. Stop when you feel a gentle stretch in the back of your shoulder and upper arm. ? Keep your arm at shoulder height. ? Keep your arm as close to your body as you comfortably can. 2. Hold for __________ seconds. 3. Slowly return to the starting position. Repeat __________ times. Complete this exercise __________ times a day. Strengthening exercises These exercises build strength and endurance in your shoulder. Endurance is the ability to use your muscles for a long time, even after they get tired. External rotation, isometric This is an exercise in which you press the back of your wrist against a door frame without moving your shoulder joint (isometric). 1. Stand or sit in a doorway, facing the door frame. 2. Bend your left / right elbow and place the back of your wrist against the door frame. Only the back of your wrist should be touching the frame. Keep your upper arm at your side. 3. Gently press your wrist  against the door frame, as if you are trying to push your arm away from your abdomen (external rotation). Press as hard as you are able without pain. ? Avoid shrugging your shoulder while you press your wrist against the door frame. Keep your shoulder blade tucked down toward the middle of your back. 4. Hold for __________ seconds. 5. Slowly release the tension, and relax your muscles completely before you repeat the exercise. Repeat __________ times. Complete this exercise __________ times a day. Internal rotation, isometric This is an exercise in which you press your palm against a door frame without moving your shoulder joint (isometric). 1. Stand or sit in a doorway, facing the door frame. 2. Bend your left / right elbow and place the palm of your hand against the door frame. Only your palm should be touching the frame. Keep your upper arm at your side. 3. Gently  press your hand against the door frame, as if you are trying to push your arm toward your abdomen (internal rotation). Press as hard as you are able without pain. ? Avoid shrugging your shoulder while you press your hand against the door frame. Keep your shoulder blade tucked down toward the middle of your back. 4. Hold for __________ seconds. 5. Slowly release the tension, and relax your muscles completely before you repeat the exercise. Repeat __________ times. Complete this exercise __________ times a day. Scapular protraction, supine  1. Lie on your back on a firm surface (supine position). Hold a __________ weight in your left / right hand. 2. Raise your left / right arm straight into the air so your hand is directly above your shoulder joint. 3. Push the weight into the air so your shoulder (scapula) lifts off the surface that you are lying on. The scapula will push up or forward (protraction). Do not move your head, neck, or back. 4. Hold for __________ seconds. 5. Slowly return to the starting position. Let your muscles relax  completely before you repeat this exercise. Repeat __________ times. Complete this exercise __________ times a day. Scapular retraction  1. Sit in a stable chair without armrests, or stand up. 2. Secure an exercise band to a stable object in front of you so the band is at shoulder height. 3. Hold one end of the exercise band in each hand. Your palms should face down. 4. Squeeze your shoulder blades together (retraction) and move your elbows slightly behind you. Do not shrug your shoulders upward while you do this. 5. Hold for __________ seconds. 6. Slowly return to the starting position. Repeat __________ times. Complete this exercise __________ times a day. Shoulder extension  1. Sit in a stable chair without armrests, or stand up. 2. Secure an exercise band to a stable object in front of you so the band is above shoulder height. 3. Hold one end of the exercise band in each hand. 4. Straighten your elbows and lift your hands up to shoulder height. 5. Squeeze your shoulder blades together and pull your hands down to the sides of your thighs (extension). Stop when your hands are straight down by your sides. Do not let your hands go behind your body. 6. Hold for __________ seconds. 7. Slowly return to the starting position. Repeat __________ times. Complete this exercise __________ times a day.
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
## 2019-09-03 ENCOUNTER — Other Ambulatory Visit: Payer: Self-pay

## 2019-09-03 ENCOUNTER — Encounter: Payer: Self-pay | Admitting: Family Medicine

## 2019-09-03 ENCOUNTER — Ambulatory Visit (INDEPENDENT_AMBULATORY_CARE_PROVIDER_SITE_OTHER): Payer: Medicare Other | Admitting: Family Medicine

## 2019-09-03 VITALS — BP 164/78 | HR 83 | Wt 169.0 lb

## 2019-09-03 DIAGNOSIS — F321 Major depressive disorder, single episode, moderate: Secondary | ICD-10-CM | POA: Diagnosis not present

## 2019-09-03 DIAGNOSIS — G44219 Episodic tension-type headache, not intractable: Secondary | ICD-10-CM

## 2019-09-03 DIAGNOSIS — M25512 Pain in left shoulder: Secondary | ICD-10-CM

## 2019-09-03 DIAGNOSIS — I10 Essential (primary) hypertension: Secondary | ICD-10-CM

## 2019-09-03 DIAGNOSIS — E1169 Type 2 diabetes mellitus with other specified complication: Secondary | ICD-10-CM

## 2019-09-03 LAB — POCT GLYCOSYLATED HEMOGLOBIN (HGB A1C): HbA1c, POC (controlled diabetic range): 10.1 % — AB (ref 0.0–7.0)

## 2019-09-03 MED ORDER — DULOXETINE HCL 20 MG PO CPEP: 20.0000 mg | ORAL_CAPSULE | Freq: Every day | ORAL | 3 refills | Status: DC

## 2019-09-03 NOTE — Assessment & Plan Note (Signed)
Patient with type 2 diabetes, HbA1c slightly improved at 10.8% down to 10.1%.  Patient taking Humalog 3 times daily as needed elevated blood sugars, is not checking his blood sugar before every meal.  Also prescribe Lantus 5-10 units daily which she is taking. -Patient instructed to continue to take insulin as prescribed -Patient instructed to check blood sugar before every meal -Follow-up 2 weeks

## 2019-09-03 NOTE — Assessment & Plan Note (Signed)
Blood pressure today 164/78.  Patient having associated headaches.  Prescribed amlodipine 10 mg, carvedilol 12.5 mg twice daily, losartan 50 mg daily.  Patient also prescribed hydralazine 25 mg 3 times daily however patient unsure if he is actually taking this. -Instructed patient to take all of his medications as prescribed -Told to follow-up with [place] in 2 weeks to address this issue, instructed to bring all medications to this appointment

## 2019-09-03 NOTE — Assessment & Plan Note (Signed)
Patient describes occasional headaches in frontal region, denies any lacrimation or rhinorrhea.  Is known to have cervical pain, left shoulder pain which could be contributing to headaches.  Blood pressure is well elevated on visit today 164/78, which is also a contributing factor.  Patient has blurred vision at baseline.  Denies chest pain. -Patient instructed to continue taking Tylenol 500 mg every 6 for headaches -Instructed to take blood pressure medicines in hopes of reducing headaches -Follow-up 2 weeks
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
## 2019-09-19 ENCOUNTER — Ambulatory Visit: Payer: Medicare Other | Admitting: Family Medicine

## 2019-10-01 ENCOUNTER — Ambulatory Visit (INDEPENDENT_AMBULATORY_CARE_PROVIDER_SITE_OTHER): Payer: Medicare Other | Admitting: Registered Nurse

## 2019-10-01 ENCOUNTER — Other Ambulatory Visit: Payer: Self-pay

## 2019-10-01 ENCOUNTER — Encounter: Payer: Self-pay | Admitting: Registered Nurse

## 2019-10-01 VITALS — BP 185/97 | HR 80 | Temp 98.3°F | Resp 18 | Ht 71.5 in | Wt 175.0 lb

## 2019-10-01 DIAGNOSIS — E1169 Type 2 diabetes mellitus with other specified complication: Secondary | ICD-10-CM

## 2019-10-01 DIAGNOSIS — N186 End stage renal disease: Secondary | ICD-10-CM

## 2019-10-01 DIAGNOSIS — F321 Major depressive disorder, single episode, moderate: Secondary | ICD-10-CM

## 2019-10-01 DIAGNOSIS — S161XXA Strain of muscle, fascia and tendon at neck level, initial encounter: Secondary | ICD-10-CM

## 2019-10-01 DIAGNOSIS — S161XXD Strain of muscle, fascia and tendon at neck level, subsequent encounter: Secondary | ICD-10-CM | POA: Diagnosis not present

## 2019-10-01 DIAGNOSIS — I10 Essential (primary) hypertension: Secondary | ICD-10-CM

## 2019-10-01 MED ORDER — DULOXETINE HCL 20 MG PO CPEP: 20.0000 mg | ORAL_CAPSULE | Freq: Every day | ORAL | 3 refills | Status: DC

## 2019-10-01 NOTE — Patient Instructions (Signed)
° ° ° °  If you have lab work done today you will be contacted with your lab results within the next 2 weeks.  If you have not heard from us then please contact us. The fastest way to get your results is to register for My Chart. ° ° °IF you received an x-ray today, you will receive an invoice from [facility]. Please contact [facility] at [phone] with questions or concerns regarding your invoice.  ° °IF you received labwork today, you will receive an invoice from [facility]. Please contact [facility] at [phone] with questions or concerns regarding your invoice.  ° °Our billing staff will not be able to assist you with questions regarding bills from these companies. ° °You will be contacted with the lab results as soon as they are available. The fastest way to get your results is to activate your My Chart account. Instructions are located on the last page of this paperwork. If you have not heard from us regarding the results in 2 weeks, please contact this office. °  ° ° ° °

## 2019-10-02 LAB — CBC WITH DIFFERENTIAL
Basophils Absolute: 0 10*3/uL (ref 0.0–0.2)
Basos: 1 %
EOS (ABSOLUTE): 0.1 10*3/uL (ref 0.0–0.4)
Eos: 2 %
Hematocrit: 34.4 % — ABNORMAL LOW (ref 37.5–51.0)
Hemoglobin: 10.4 g/dL — ABNORMAL LOW (ref 13.0–17.7)
Immature Grans (Abs): 0 10*3/uL (ref 0.0–0.1)
Immature Granulocytes: 0 %
Lymphocytes Absolute: 0.7 10*3/uL (ref 0.7–3.1)
Lymphs: 17 %
MCH: 24.5 pg — ABNORMAL LOW (ref 26.6–33.0)
MCHC: 30.2 g/dL — ABNORMAL LOW (ref 31.5–35.7)
MCV: 81 fL (ref 79–97)
Monocytes Absolute: 0.5 10*3/uL (ref 0.1–0.9)
Monocytes: 13 %
Neutrophils Absolute: 2.6 10*3/uL (ref 1.4–7.0)
Neutrophils: 67 %
RBC: 4.25 x10E6/uL (ref 4.14–5.80)
RDW: 16.1 % — ABNORMAL HIGH (ref 11.6–15.4)
WBC: 4 10*3/uL (ref 3.4–10.8)

## 2019-10-02 LAB — HEMOGLOBIN A1C
Est. average glucose Bld gHb Est-mCnc: 235 mg/dL
Hgb A1c MFr Bld: 9.8 % — ABNORMAL HIGH (ref 4.8–5.6)

## 2019-10-02 LAB — LIPID PANEL
Chol/HDL Ratio: 3.7 ratio (ref 0.0–5.0)
Cholesterol, Total: 171 mg/dL (ref 100–199)
HDL: 46 mg/dL (ref >39)
LDL Chol Calc (NIH): 109 mg/dL — ABNORMAL HIGH (ref 0–99)
Triglycerides: 84 mg/dL (ref 0–149)
VLDL Cholesterol Cal: 16 mg/dL (ref 5–40)

## 2019-10-02 LAB — COMPREHENSIVE METABOLIC PANEL
ALT: 6 IU/L (ref 0–44)
AST: 14 IU/L (ref 0–40)
Albumin/Globulin Ratio: 1.4 (ref 1.2–2.2)
Albumin: 3.4 g/dL — ABNORMAL LOW (ref 3.8–4.9)
Alkaline Phosphatase: 78 IU/L (ref 39–117)
BUN/Creatinine Ratio: 5 — ABNORMAL LOW (ref 9–20)
BUN: 46 mg/dL — ABNORMAL HIGH (ref 6–24)
Bilirubin Total: <0.2 mg/dL (ref 0.0–1.2)
CO2: 23 mmol/L (ref 20–29)
Calcium: 7.4 mg/dL — ABNORMAL LOW (ref 8.7–10.2)
Chloride: 99 mmol/L (ref 96–106)
Creatinine, Ser: 8.52 mg/dL (ref 0.76–1.27)
GFR calc Af Amer: 7 mL/min/{1.73_m2} — ABNORMAL LOW (ref >59)
GFR calc non Af Amer: 6 mL/min/{1.73_m2} — ABNORMAL LOW (ref >59)
Globulin, Total: 2.4 g/dL (ref 1.5–4.5)
Glucose: 371 mg/dL — ABNORMAL HIGH (ref 65–99)
Potassium: 4.7 mmol/L (ref 3.5–5.2)
Sodium: 139 mmol/L (ref 134–144)
Total Protein: 5.8 g/dL — ABNORMAL LOW (ref 6.0–8.5)

## 2019-10-02 LAB — TSH: TSH: 3.53 u[IU]/mL (ref 0.450–4.500)

## 2019-10-16 ENCOUNTER — Ambulatory Visit: Payer: Medicare Other | Admitting: Registered Nurse

## 2019-10-18 ENCOUNTER — Encounter: Payer: Self-pay | Admitting: Registered Nurse

## 2019-11-01 ENCOUNTER — Encounter: Payer: Self-pay | Admitting: Registered Nurse
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
## 2019-11-28 ENCOUNTER — Ambulatory Visit (INDEPENDENT_AMBULATORY_CARE_PROVIDER_SITE_OTHER): Payer: Medicare Other | Admitting: Podiatry

## 2019-11-28 DIAGNOSIS — Z5329 Procedure and treatment not carried out because of patient's decision for other reasons: Secondary | ICD-10-CM

## 2019-11-28 NOTE — Progress Notes (Signed)
No show for appt. 

## 2019-12-23 ENCOUNTER — Encounter: Payer: Self-pay | Admitting: Registered Nurse

## 2019-12-23 ENCOUNTER — Other Ambulatory Visit: Payer: Self-pay

## 2019-12-23 ENCOUNTER — Ambulatory Visit (INDEPENDENT_AMBULATORY_CARE_PROVIDER_SITE_OTHER): Payer: Medicare Other | Admitting: Registered Nurse

## 2019-12-23 VITALS — BP 196/76 | HR 86 | Temp 97.9°F | Resp 17 | Ht 71.5 in | Wt 175.0 lb

## 2019-12-23 DIAGNOSIS — I10 Essential (primary) hypertension: Secondary | ICD-10-CM

## 2019-12-23 DIAGNOSIS — E1169 Type 2 diabetes mellitus with other specified complication: Secondary | ICD-10-CM | POA: Diagnosis not present

## 2019-12-23 DIAGNOSIS — N186 End stage renal disease: Secondary | ICD-10-CM

## 2019-12-23 DIAGNOSIS — Z01818 Encounter for other preprocedural examination: Secondary | ICD-10-CM
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
## 2019-12-23 NOTE — Patient Instructions (Signed)
° ° ° °  If you have lab work done today you will be contacted with your lab results within the next 2 weeks.  If you have not heard from us then please contact us. The fastest way to get your results is to register for My Chart. ° ° °IF you received an x-ray today, you will receive an invoice from [facility]. Please contact [facility] at [phone] with questions or concerns regarding your invoice.  ° °IF you received labwork today, you will receive an invoice from [facility]. Please contact [facility] at [phone] with questions or concerns regarding your invoice.  ° °Our billing staff will not be able to assist you with questions regarding bills from these companies. ° °You will be contacted with the lab results as soon as they are available. The fastest way to get your results is to activate your My Chart account. Instructions are located on the last page of this paperwork. If you have not heard from us regarding the results in 2 weeks, please contact this office. °  ° ° ° °

## 2019-12-24 ENCOUNTER — Telehealth: Payer: Self-pay | Admitting: *Deleted

## 2019-12-24 LAB — COMPREHENSIVE METABOLIC PANEL
ALT: 9 IU/L (ref 0–44)
AST: 13 IU/L (ref 0–40)
Albumin/Globulin Ratio: 1.4 (ref 1.2–2.2)
Albumin: 4 g/dL (ref 3.8–4.9)
Alkaline Phosphatase: 70 IU/L (ref 48–121)
BUN/Creatinine Ratio: 7 — ABNORMAL LOW (ref 9–20)
BUN: 90 mg/dL (ref 6–24)
Bilirubin Total: 0.4 mg/dL (ref 0.0–1.2)
CO2: 21 mmol/L (ref 20–29)
Calcium: 8.9 mg/dL (ref 8.7–10.2)
Chloride: 93 mmol/L — ABNORMAL LOW (ref 96–106)
Creatinine, Ser: 13.13 mg/dL (ref 0.76–1.27)
GFR calc Af Amer: 4 mL/min/{1.73_m2} — ABNORMAL LOW (ref >59)
GFR calc non Af Amer: 4 mL/min/{1.73_m2} — ABNORMAL LOW (ref >59)
Globulin, Total: 2.8 g/dL (ref 1.5–4.5)
Glucose: 446 mg/dL — ABNORMAL HIGH (ref 65–99)
Potassium: 4.5 mmol/L (ref 3.5–5.2)
Sodium: 136 mmol/L (ref 134–144)
Total Protein: 6.8 g/dL (ref 6.0–8.5)

## 2019-12-24 LAB — CBC WITH DIFFERENTIAL
Basophils Absolute: 0 10*3/uL (ref 0.0–0.2)
Basos: 1 %
EOS (ABSOLUTE): 0.1 10*3/uL (ref 0.0–0.4)
Eos: 2 %
Hematocrit: 32.7 % — ABNORMAL LOW (ref 37.5–51.0)
Hemoglobin: 10.1 g/dL — ABNORMAL LOW (ref 13.0–17.7)
Immature Grans (Abs): 0 10*3/uL (ref 0.0–0.1)
Immature Granulocytes: 0 %
Lymphocytes Absolute: 0.8 10*3/uL (ref 0.7–3.1)
Lymphs: 17 %
MCH: 24.7 pg — ABNORMAL LOW (ref 26.6–33.0)
MCHC: 30.9 g/dL — ABNORMAL LOW (ref 31.5–35.7)
MCV: 80 fL (ref 79–97)
Monocytes Absolute: 0.5 10*3/uL (ref 0.1–0.9)
Monocytes: 9 %
Neutrophils Absolute: 3.5 10*3/uL (ref 1.4–7.0)
Neutrophils: 71 %
RBC: 4.09 x10E6/uL — ABNORMAL LOW (ref 4.14–5.80)
RDW: 18.3 % — ABNORMAL HIGH (ref 11.6–15.4)
WBC: 4.8 10*3/uL (ref 3.4–10.8)

## 2019-12-24 LAB — HEMOGLOBIN A1C
Est. average glucose Bld gHb Est-mCnc: 292 mg/dL
Hgb A1c MFr Bld: 11.8 % — ABNORMAL HIGH (ref 4.8–5.6)

## 2019-12-24 LAB — PROTIME-INR
INR: 1 (ref 0.9–1.2)
Prothrombin Time: 10.9 s (ref 9.1–12.0)

## 2019-12-24 NOTE — Telephone Encounter (Signed)
No action needed. Jeremiah [name] will call patient

## 2019-12-24 NOTE — Telephone Encounter (Signed)
Jeremiah [name], Systems developer, from [employer] called to report an alert lab, BUN 90; the lab obtained 12/23/19 at 0942; value repeated for accuracy; the pt is seen at [facility]; Mardene Celeste, CMA notified; she states that she will notify the provider; will also route to office for notification.

## 2019-12-24 NOTE — Telephone Encounter (Signed)
Aware of result. Pt is in ESRD and on transplant list at [facility].  Unfortunately this almost assuredly precludes any operation beyond renal transplant unless given express written consent from his renal and transplant teams at [facility]. Will call patient to discuss today.  Kathrin Ruddy, NP

## 2019-12-24 NOTE — Progress Notes (Signed)
If we could call Mr. Brogden-   Unfortunately his creatinine has continued to rise. At this time, I don't foresee Orthopedic Surgery being willing to consider him a candidate for surgery. I encourage him to follow up with nephrology as scheduled and with cardiology to make sure he is in the best possible position for a kidney transplant.  Thank you!  Kathrin Ruddy, NP

## 2019-12-24 NOTE — Telephone Encounter (Signed)
Please advise 

## 2019-12-25 ENCOUNTER — Emergency Department (HOSPITAL_COMMUNITY): Payer: Medicare Other

## 2019-12-25 ENCOUNTER — Emergency Department (HOSPITAL_COMMUNITY)
Admission: EM | Admit: 2019-12-25 | Discharge: 2019-12-26 | Disposition: A | Discharge status: Home / Self Care | Payer: Medicare Other | Attending: Emergency Medicine | Admitting: Emergency Medicine

## 2019-12-25 ENCOUNTER — Encounter (HOSPITAL_COMMUNITY): Payer: Self-pay | Admitting: Emergency Medicine

## 2019-12-25 ENCOUNTER — Other Ambulatory Visit: Payer: Self-pay

## 2019-12-25 DIAGNOSIS — R531 Weakness: Secondary | ICD-10-CM | POA: Diagnosis not present

## 2019-12-25 DIAGNOSIS — Z992 Dependence on renal dialysis: Secondary | ICD-10-CM | POA: Diagnosis not present

## 2019-12-25 DIAGNOSIS — Z794 Long term (current) use of insulin: Secondary | ICD-10-CM | POA: Insufficient documentation

## 2019-12-25 DIAGNOSIS — R0600 Dyspnea, unspecified: Secondary | ICD-10-CM

## 2019-12-25 DIAGNOSIS — Z79899 Other long term (current) drug therapy: Secondary | ICD-10-CM | POA: Insufficient documentation

## 2019-12-25 DIAGNOSIS — N186 End stage renal disease: Secondary | ICD-10-CM | POA: Diagnosis not present

## 2019-12-25 DIAGNOSIS — E1122 Type 2 diabetes mellitus with diabetic chronic kidney disease: Secondary | ICD-10-CM | POA: Diagnosis not present

## 2019-12-25 DIAGNOSIS — I120 Hypertensive chronic kidney disease with stage 5 chronic kidney disease or end stage renal disease: Secondary | ICD-10-CM | POA: Diagnosis not present

## 2019-12-25 DIAGNOSIS — R0609 Other forms of dyspnea: Secondary | ICD-10-CM | POA: Insufficient documentation

## 2019-12-25 LAB — BASIC METABOLIC PANEL
Anion gap: 16 — ABNORMAL HIGH (ref 5–15)
BUN: 77 mg/dL — ABNORMAL HIGH (ref 6–20)
CO2: 24 mmol/L (ref 22–32)
Calcium: 8.8 mg/dL — ABNORMAL LOW (ref 8.9–10.3)
Chloride: 99 mmol/L (ref 98–111)
Creatinine, Ser: 13.47 mg/dL — ABNORMAL HIGH (ref 0.61–1.24)
GFR calc Af Amer: 4 mL/min — ABNORMAL LOW (ref >60)
GFR calc non Af Amer: 4 mL/min — ABNORMAL LOW (ref >60)
Glucose, Bld: 446 mg/dL — ABNORMAL HIGH (ref 70–99)
Potassium: 4.6 mmol/L (ref 3.5–5.1)
Sodium: 139 mmol/L (ref 135–145)

## 2019-12-25 LAB — CBC
HCT: 33.5 % — ABNORMAL LOW (ref 39.0–52.0)
Hemoglobin: 10.3 g/dL — ABNORMAL LOW (ref 13.0–17.0)
MCH: 23.9 pg — ABNORMAL LOW (ref 26.0–34.0)
MCHC: 30.7 g/dL (ref 30.0–36.0)
MCV: 77.7 fL — ABNORMAL LOW (ref 80.0–100.0)
Platelets: 294 10*3/uL (ref 150–400)
RBC: 4.31 MIL/uL (ref 4.22–5.81)
RDW: 18 % — ABNORMAL HIGH (ref 11.5–15.5)
WBC: 4.1 10*3/uL (ref 4.0–10.5)
nRBC: 0 % (ref 0.0–0.2)

## 2019-12-25 MED ORDER — SODIUM CHLORIDE 0.9% FLUSH: 3.0000 mL | Freq: Once | INTRAVENOUS | Status: DC

## 2019-12-25 NOTE — ED Triage Notes (Signed)
Pt c/o shortness of breath and weakness after missing dialysis today. Dialysis MWF. Denies pain.
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
## 2019-12-25 NOTE — ED Notes (Signed)
Pt ambulates with no assistance. Maintains >97%. Steady on feet with no complaints.

## 2019-12-26 NOTE — Discharge Instructions (Addendum)
Call your dialysis center tomorrow and coordinate an appointment for treatment. If you shortness of breath worsens or you develop new symptoms of concern, return to the emergency department for further evaluation.

## 2020-01-07 ENCOUNTER — Other Ambulatory Visit: Payer: Self-pay

## 2020-01-07 DIAGNOSIS — E1165 Type 2 diabetes mellitus with hyperglycemia: Secondary | ICD-10-CM

## 2020-01-07 MED ORDER — INSULIN LISPRO (1 UNIT DIAL) 100 UNIT/ML (KWIKPEN): 3.0000 [IU] | PEN_INJECTOR | Freq: Every day | SUBCUTANEOUS | 3 refills | Status: DC | PRN

## 2020-01-08 ENCOUNTER — Other Ambulatory Visit: Payer: Self-pay | Admitting: Registered Nurse

## 2020-01-08 DIAGNOSIS — E1165 Type 2 diabetes mellitus with hyperglycemia: Secondary | ICD-10-CM

## 2020-01-08 MED ORDER — BLOOD GLUCOSE MONITOR KIT: PACK | 6 refills | Status: DC

## 2020-01-08 MED ORDER — ACCU-CHEK MULTICLIX LANCETS MISC: 12 refills | Status: DC

## 2020-01-08 MED ORDER — PEN NEEDLES 32G X 4 MM MISC: 100.0000 "pen " | Freq: Four times a day (QID) | 2 refills | Status: AC

## 2020-01-08 MED ORDER — LANTUS SOLOSTAR 100 UNIT/ML ~~LOC~~ SOPN: 5.0000 [IU] | PEN_INJECTOR | SUBCUTANEOUS | 2 refills | Status: DC

## 2020-01-08 MED ORDER — ACCU-CHEK AVIVA PLUS VI STRP: ORAL_STRIP | 12 refills | Status: DC

## 2020-01-09 ENCOUNTER — Ambulatory Visit: Payer: Medicare Other | Admitting: Cardiology
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
## 2020-01-20 ENCOUNTER — Telehealth: Payer: Self-pay | Admitting: Registered Nurse

## 2020-01-20 ENCOUNTER — Other Ambulatory Visit: Payer: Self-pay | Admitting: Registered Nurse

## 2020-01-20 DIAGNOSIS — E1165 Type 2 diabetes mellitus with hyperglycemia: Secondary | ICD-10-CM

## 2020-01-20 MED ORDER — NOVOLOG FLEXPEN 100 UNIT/ML ~~LOC~~ SOPN: 5.0000 [IU] | PEN_INJECTOR | Freq: Three times a day (TID) | SUBCUTANEOUS | 0 refills | Status: AC

## 2020-01-20 MED ORDER — BASAGLAR KWIKPEN 100 UNIT/ML ~~LOC~~ SOPN: 20.0000 [IU] | PEN_INJECTOR | Freq: Every day | SUBCUTANEOUS | 0 refills | Status: DC

## 2020-02-20 ENCOUNTER — Ambulatory Visit: Payer: Medicare Other | Admitting: Cardiovascular Disease

## 2020-02-24 ENCOUNTER — Telehealth: Payer: Self-pay

## 2020-02-24 NOTE — Telephone Encounter (Signed)
Pharmacy called requesting you write lancets for 3 times daily insurance will not cover 4 daily

## 2020-02-24 NOTE — Telephone Encounter (Signed)
Pharmacy called requesting Orland Mustard rewrite the scripts for his lancets and test strips to 3 times daily as his insurance will not cover 4 times daily

## 2020-03-03 ENCOUNTER — Other Ambulatory Visit: Payer: Self-pay | Admitting: Registered Nurse

## 2020-03-03 DIAGNOSIS — E1165 Type 2 diabetes mellitus with hyperglycemia: Secondary | ICD-10-CM

## 2020-03-03 MED ORDER — ACCU-CHEK MULTICLIX LANCETS MISC: 12 refills | Status: DC

## 2020-03-03 NOTE — Telephone Encounter (Signed)
Sent  Thanks,  Kathrin Ruddy, NP

## 2020-05-08 ENCOUNTER — Encounter (HOSPITAL_COMMUNITY): Payer: Self-pay

## 2020-05-08 ENCOUNTER — Emergency Department (HOSPITAL_COMMUNITY): Payer: Medicare Other

## 2020-05-08 ENCOUNTER — Other Ambulatory Visit: Payer: Self-pay

## 2020-05-08 ENCOUNTER — Emergency Department (HOSPITAL_COMMUNITY)
Admission: EM | Admit: 2020-05-08 | Discharge: 2020-05-08 | Disposition: A | Discharge status: Home / Self Care | Payer: Medicare Other | Attending: Emergency Medicine | Admitting: Emergency Medicine

## 2020-05-08 DIAGNOSIS — Z7722 Contact with and (suspected) exposure to environmental tobacco smoke (acute) (chronic): Secondary | ICD-10-CM | POA: Diagnosis not present

## 2020-05-08 DIAGNOSIS — Z79899 Other long term (current) drug therapy: Secondary | ICD-10-CM | POA: Insufficient documentation

## 2020-05-08 DIAGNOSIS — Z992 Dependence on renal dialysis: Secondary | ICD-10-CM | POA: Insufficient documentation

## 2020-05-08 DIAGNOSIS — N186 End stage renal disease: Secondary | ICD-10-CM | POA: Insufficient documentation

## 2020-05-08 DIAGNOSIS — R569 Unspecified convulsions: Secondary | ICD-10-CM

## 2020-05-08 DIAGNOSIS — Z7982 Long term (current) use of aspirin: Secondary | ICD-10-CM | POA: Insufficient documentation

## 2020-05-08 DIAGNOSIS — Z794 Long term (current) use of insulin: Secondary | ICD-10-CM | POA: Diagnosis not present

## 2020-05-08 DIAGNOSIS — I120 Hypertensive chronic kidney disease with stage 5 chronic kidney disease or end stage renal disease: Secondary | ICD-10-CM | POA: Diagnosis not present

## 2020-05-08 DIAGNOSIS — F1590 Other stimulant use, unspecified, uncomplicated: Secondary | ICD-10-CM | POA: Insufficient documentation

## 2020-05-08 DIAGNOSIS — E1122 Type 2 diabetes mellitus with diabetic chronic kidney disease: Secondary | ICD-10-CM | POA: Insufficient documentation

## 2020-05-08 DIAGNOSIS — R059 Cough, unspecified: Secondary | ICD-10-CM

## 2020-05-08 LAB — CBC WITH DIFFERENTIAL/PLATELET
Abs Immature Granulocytes: 0.01 10*3/uL (ref 0.00–0.07)
Basophils Absolute: 0 10*3/uL (ref 0.0–0.1)
Basophils Relative: 1 %
Eosinophils Absolute: 0.1 10*3/uL (ref 0.0–0.5)
Eosinophils Relative: 2 %
HCT: 27 % — ABNORMAL LOW (ref 39.0–52.0)
Hemoglobin: 8.2 g/dL — ABNORMAL LOW (ref 13.0–17.0)
Immature Granulocytes: 0 %
Lymphocytes Relative: 19 %
Lymphs Abs: 0.7 10*3/uL (ref 0.7–4.0)
MCH: 23.9 pg — ABNORMAL LOW (ref 26.0–34.0)
MCHC: 30.4 g/dL (ref 30.0–36.0)
MCV: 78.7 fL — ABNORMAL LOW (ref 80.0–100.0)
Monocytes Absolute: 0.3 10*3/uL (ref 0.1–1.0)
Monocytes Relative: 9 %
Neutro Abs: 2.4 10*3/uL (ref 1.7–7.7)
Neutrophils Relative %: 69 %
Platelets: 216 10*3/uL (ref 150–400)
RBC: 3.43 MIL/uL — ABNORMAL LOW (ref 4.22–5.81)
RDW: 17.5 % — ABNORMAL HIGH (ref 11.5–15.5)
WBC: 3.5 10*3/uL — ABNORMAL LOW (ref 4.0–10.5)
nRBC: 0 % (ref 0.0–0.2)

## 2020-05-08 LAB — COMPREHENSIVE METABOLIC PANEL
ALT: 44 U/L (ref 0–44)
AST: 49 U/L — ABNORMAL HIGH (ref 15–41)
Albumin: 3.5 g/dL (ref 3.5–5.0)
Alkaline Phosphatase: 70 U/L (ref 38–126)
Anion gap: 15 (ref 5–15)
BUN: 64 mg/dL — ABNORMAL HIGH (ref 6–20)
CO2: 28 mmol/L (ref 22–32)
Calcium: 8.4 mg/dL — ABNORMAL LOW (ref 8.9–10.3)
Chloride: 96 mmol/L — ABNORMAL LOW (ref 98–111)
Creatinine, Ser: 8.51 mg/dL — ABNORMAL HIGH (ref 0.61–1.24)
GFR, Estimated: 7 mL/min — ABNORMAL LOW (ref >60)
Glucose, Bld: 182 mg/dL — ABNORMAL HIGH (ref 70–99)
Potassium: 4.2 mmol/L (ref 3.5–5.1)
Sodium: 139 mmol/L (ref 135–145)
Total Bilirubin: 0.7 mg/dL (ref 0.3–1.2)
Total Protein: 6.7 g/dL (ref 6.5–8.1)

## 2020-05-08 LAB — LIPASE, BLOOD: Lipase: 36 U/L (ref 11–51)

## 2020-05-08 LAB — LACTIC ACID, PLASMA: Lactic Acid, Venous: 1 mmol/L (ref 0.5–1.9)

## 2020-05-08 LAB — CBG MONITORING, ED: Glucose-Capillary: 167 mg/dL — ABNORMAL HIGH (ref 70–99)

## 2020-05-08 MED ORDER — AMOXICILLIN-POT CLAVULANATE 875-125 MG PO TABS
1.0000 | ORAL_TABLET | Freq: Once | ORAL | Status: AC
  Administered 2020-05-08: 1 via ORAL
  Filled 2020-05-08: qty 1

## 2020-05-08 MED ORDER — AZITHROMYCIN 250 MG PO TABS
500.0000 mg | ORAL_TABLET | Freq: Once | ORAL | Status: AC
  Administered 2020-05-08: 500 mg via ORAL
  Filled 2020-05-08: qty 2

## 2020-05-08 MED ORDER — AMOXICILLIN-POT CLAVULANATE 875-125 MG PO TABS
1.0000 | ORAL_TABLET | Freq: Two times a day (BID) | ORAL | 0 refills | Status: DC
Start: 2020-05-08 — End: 2020-05-11

## 2020-05-08 MED ORDER — AZITHROMYCIN 250 MG PO TABS: 250.0000 mg | ORAL_TABLET | Freq: Every day | ORAL | 0 refills | Status: DC

## 2020-05-08 NOTE — Discharge Instructions (Signed)
As discussed, today's evaluation has been generally reassuring.  The episode you experience that dialysis may have been a seizure, or loss of consciousness.  Please be sure to follow-up with both your primary care physician and our neurology colleagues. Our neurology team should contact you in the next few days for follow-up within 1 week.  Please do not drive until you have been medically cleared by both your primary care physician and our neurology colleagues.  Take all medication as directed and do not hesitate to return here for concerning changes in your condition.
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
## 2020-05-08 NOTE — ED Triage Notes (Signed)
Pt BIB [facility] from dialysis when he began having a seizure. Pt has no hx of seizures. Pt does not remember anything from the event, remembers looking at the clock at 0720 and then waking to people standing over the top of him. Denies loss of bowel or bladder.   20g LFA

## 2020-05-11 ENCOUNTER — Observation Stay (HOSPITAL_COMMUNITY): Payer: Medicare Other

## 2020-05-11 ENCOUNTER — Other Ambulatory Visit: Payer: Self-pay

## 2020-05-11 ENCOUNTER — Inpatient Hospital Stay (HOSPITAL_COMMUNITY)
Admission: EM | Admit: 2020-05-11 | Discharge: 2020-05-14 | DRG: 100 | Discharge status: Against Medical Advice | Payer: Medicare Other | Source: Ambulatory Visit | Attending: Family Medicine | Admitting: Family Medicine

## 2020-05-11 DIAGNOSIS — I160 Hypertensive urgency: Secondary | ICD-10-CM | POA: Diagnosis present

## 2020-05-11 DIAGNOSIS — N19 Unspecified kidney failure: Secondary | ICD-10-CM | POA: Diagnosis not present

## 2020-05-11 DIAGNOSIS — R4182 Altered mental status, unspecified: Secondary | ICD-10-CM | POA: Diagnosis not present

## 2020-05-11 DIAGNOSIS — Z5329 Procedure and treatment not carried out because of patient's decision for other reasons: Secondary | ICD-10-CM | POA: Diagnosis not present

## 2020-05-11 DIAGNOSIS — R569 Unspecified convulsions: Secondary | ICD-10-CM | POA: Insufficient documentation

## 2020-05-11 DIAGNOSIS — N186 End stage renal disease: Secondary | ICD-10-CM | POA: Diagnosis present

## 2020-05-11 DIAGNOSIS — R195 Other fecal abnormalities: Secondary | ICD-10-CM | POA: Diagnosis present

## 2020-05-11 DIAGNOSIS — I422 Other hypertrophic cardiomyopathy: Secondary | ICD-10-CM | POA: Diagnosis present

## 2020-05-11 DIAGNOSIS — Z794 Long term (current) use of insulin: Secondary | ICD-10-CM

## 2020-05-11 DIAGNOSIS — D509 Iron deficiency anemia, unspecified: Secondary | ICD-10-CM | POA: Diagnosis present

## 2020-05-11 DIAGNOSIS — Z823 Family history of stroke: Secondary | ICD-10-CM

## 2020-05-11 DIAGNOSIS — N2581 Secondary hyperparathyroidism of renal origin: Secondary | ICD-10-CM | POA: Diagnosis present

## 2020-05-11 DIAGNOSIS — Z992 Dependence on renal dialysis: Secondary | ICD-10-CM

## 2020-05-11 DIAGNOSIS — J9601 Acute respiratory failure with hypoxia: Secondary | ICD-10-CM | POA: Diagnosis present

## 2020-05-11 DIAGNOSIS — E878 Other disorders of electrolyte and fluid balance, not elsewhere classified: Secondary | ICD-10-CM | POA: Diagnosis present

## 2020-05-11 DIAGNOSIS — G4089 Other seizures: Secondary | ICD-10-CM | POA: Diagnosis not present

## 2020-05-11 DIAGNOSIS — Z83438 Family history of other disorder of lipoprotein metabolism and other lipidemia: Secondary | ICD-10-CM

## 2020-05-11 DIAGNOSIS — Z79899 Other long term (current) drug therapy: Secondary | ICD-10-CM

## 2020-05-11 DIAGNOSIS — Z7982 Long term (current) use of aspirin: Secondary | ICD-10-CM

## 2020-05-11 DIAGNOSIS — E1122 Type 2 diabetes mellitus with diabetic chronic kidney disease: Secondary | ICD-10-CM | POA: Diagnosis present

## 2020-05-11 DIAGNOSIS — Z8701 Personal history of pneumonia (recurrent): Secondary | ICD-10-CM

## 2020-05-11 DIAGNOSIS — E11311 Type 2 diabetes mellitus with unspecified diabetic retinopathy with macular edema: Secondary | ICD-10-CM | POA: Diagnosis present

## 2020-05-11 DIAGNOSIS — E8779 Other fluid overload: Secondary | ICD-10-CM | POA: Diagnosis present

## 2020-05-11 DIAGNOSIS — E8889 Other specified metabolic disorders: Secondary | ICD-10-CM | POA: Diagnosis present

## 2020-05-11 DIAGNOSIS — Z833 Family history of diabetes mellitus: Secondary | ICD-10-CM

## 2020-05-11 DIAGNOSIS — D631 Anemia in chronic kidney disease: Secondary | ICD-10-CM | POA: Diagnosis present

## 2020-05-11 DIAGNOSIS — Z8673 Personal history of transient ischemic attack (TIA), and cerebral infarction without residual deficits: Secondary | ICD-10-CM

## 2020-05-11 DIAGNOSIS — Z8719 Personal history of other diseases of the digestive system: Secondary | ICD-10-CM

## 2020-05-11 DIAGNOSIS — Z20822 Contact with and (suspected) exposure to covid-19: Secondary | ICD-10-CM | POA: Diagnosis present

## 2020-05-11 DIAGNOSIS — Z841 Family history of disorders of kidney and ureter: Secondary | ICD-10-CM

## 2020-05-11 DIAGNOSIS — Z8249 Family history of ischemic heart disease and other diseases of the circulatory system: Secondary | ICD-10-CM

## 2020-05-11 DIAGNOSIS — E1142 Type 2 diabetes mellitus with diabetic polyneuropathy: Secondary | ICD-10-CM | POA: Diagnosis present

## 2020-05-11 DIAGNOSIS — E785 Hyperlipidemia, unspecified: Secondary | ICD-10-CM | POA: Diagnosis present

## 2020-05-11 DIAGNOSIS — I120 Hypertensive chronic kidney disease with stage 5 chronic kidney disease or end stage renal disease: Secondary | ICD-10-CM | POA: Diagnosis present

## 2020-05-11 DIAGNOSIS — G5620 Lesion of ulnar nerve, unspecified upper limb: Secondary | ICD-10-CM | POA: Diagnosis present

## 2020-05-11 LAB — CBC
HCT: 23.6 % — ABNORMAL LOW (ref 39.0–52.0)
Hemoglobin: 7.2 g/dL — ABNORMAL LOW (ref 13.0–17.0)
MCH: 23.8 pg — ABNORMAL LOW (ref 26.0–34.0)
MCHC: 30.5 g/dL (ref 30.0–36.0)
MCV: 77.9 fL — ABNORMAL LOW (ref 80.0–100.0)
Platelets: 266 10*3/uL (ref 150–400)
RBC: 3.03 MIL/uL — ABNORMAL LOW (ref 4.22–5.81)
RDW: 17.5 % — ABNORMAL HIGH (ref 11.5–15.5)
WBC: 5.6 10*3/uL (ref 4.0–10.5)
nRBC: 0 % (ref 0.0–0.2)

## 2020-05-11 LAB — CBC WITH DIFFERENTIAL/PLATELET
Abs Immature Granulocytes: 0.01 10*3/uL (ref 0.00–0.07)
Basophils Absolute: 0.1 10*3/uL (ref 0.0–0.1)
Basophils Relative: 1 %
Eosinophils Absolute: 0.1 10*3/uL (ref 0.0–0.5)
Eosinophils Relative: 1 %
HCT: 27.1 % — ABNORMAL LOW (ref 39.0–52.0)
Hemoglobin: 8.4 g/dL — ABNORMAL LOW (ref 13.0–17.0)
Immature Granulocytes: 0 %
Lymphocytes Relative: 18 %
Lymphs Abs: 0.9 10*3/uL (ref 0.7–4.0)
MCH: 24.2 pg — ABNORMAL LOW (ref 26.0–34.0)
MCHC: 31 g/dL (ref 30.0–36.0)
MCV: 78.1 fL — ABNORMAL LOW (ref 80.0–100.0)
Monocytes Absolute: 0.4 10*3/uL (ref 0.1–1.0)
Monocytes Relative: 8 %
Neutro Abs: 3.8 10*3/uL (ref 1.7–7.7)
Neutrophils Relative %: 72 %
Platelets: 291 10*3/uL (ref 150–400)
RBC: 3.47 MIL/uL — ABNORMAL LOW (ref 4.22–5.81)
RDW: 17.8 % — ABNORMAL HIGH (ref 11.5–15.5)
WBC: 5.2 10*3/uL (ref 4.0–10.5)
nRBC: 0 % (ref 0.0–0.2)

## 2020-05-11 LAB — COMPREHENSIVE METABOLIC PANEL
ALT: 33 U/L (ref 0–44)
AST: 28 U/L (ref 15–41)
Albumin: 3.7 g/dL (ref 3.5–5.0)
Alkaline Phosphatase: 64 U/L (ref 38–126)
Anion gap: 20 — ABNORMAL HIGH (ref 5–15)
BUN: 100 mg/dL — ABNORMAL HIGH (ref 6–20)
CO2: 23 mmol/L (ref 22–32)
Calcium: 8.5 mg/dL — ABNORMAL LOW (ref 8.9–10.3)
Chloride: 97 mmol/L — ABNORMAL LOW (ref 98–111)
Creatinine, Ser: 14.18 mg/dL — ABNORMAL HIGH (ref 0.61–1.24)
GFR, Estimated: 4 mL/min — ABNORMAL LOW (ref >60)
Glucose, Bld: 204 mg/dL — ABNORMAL HIGH (ref 70–99)
Potassium: 4.8 mmol/L (ref 3.5–5.1)
Sodium: 140 mmol/L (ref 135–145)
Total Bilirubin: 0.9 mg/dL (ref 0.3–1.2)
Total Protein: 7.1 g/dL (ref 6.5–8.1)

## 2020-05-11 LAB — RESPIRATORY PANEL BY RT PCR (FLU A&B, COVID)
Influenza A by PCR: NEGATIVE
Influenza B by PCR: NEGATIVE
SARS Coronavirus 2 by RT PCR: NEGATIVE

## 2020-05-11 LAB — HEPATITIS B SURFACE ANTIGEN: Hepatitis B Surface Ag: NONREACTIVE

## 2020-05-11 LAB — CBG MONITORING, ED
Glucose-Capillary: 197 mg/dL — ABNORMAL HIGH (ref 70–99)
Glucose-Capillary: 325 mg/dL — ABNORMAL HIGH (ref 70–99)
Glucose-Capillary: 320 mg/dL — ABNORMAL HIGH (ref 70–99)

## 2020-05-11 LAB — AMMONIA: Ammonia: 14 umol/L (ref 9–35)

## 2020-05-11 MED ORDER — ASPIRIN EC 81 MG PO TBEC
81.0000 mg | DELAYED_RELEASE_TABLET | Freq: Every day | ORAL | Status: DC
  Administered 2020-05-11: 81 mg via ORAL
  Filled 2020-05-11: qty 1

## 2020-05-11 MED ORDER — HEPARIN SODIUM (PORCINE) 5000 UNIT/ML IJ SOLN
5000.0000 [IU] | Freq: Three times a day (TID) | INTRAMUSCULAR | Status: DC
  Administered 2020-05-11: 5000 [IU] via SUBCUTANEOUS
  Filled 2020-05-11: qty 1

## 2020-05-11 MED ORDER — ACETAMINOPHEN 650 MG RE SUPP: 650.0000 mg | RECTAL | Status: DC | PRN

## 2020-05-11 MED ORDER — CHLORHEXIDINE GLUCONATE CLOTH 2 % EX PADS: 6.0000 | MEDICATED_PAD | Freq: Every day | CUTANEOUS | Status: DC

## 2020-05-11 MED ORDER — AMLODIPINE BESYLATE 10 MG PO TABS
10.0000 mg | ORAL_TABLET | Freq: Every day | ORAL | Status: DC
  Administered 2020-05-11 – 2020-05-14 (×4): 10 mg via ORAL
  Filled 2020-05-11 (×3): qty 1
  Filled 2020-05-11: qty 2

## 2020-05-11 MED ORDER — INSULIN GLARGINE 100 UNIT/ML ~~LOC~~ SOLN
20.0000 [IU] | Freq: Every day | SUBCUTANEOUS | Status: DC
  Filled 2020-05-11: qty 0.2

## 2020-05-11 MED ORDER — LORAZEPAM 2 MG/ML IJ SOLN: 2.0000 mg | INTRAMUSCULAR | Status: DC | PRN

## 2020-05-11 MED ORDER — ACETAMINOPHEN 325 MG PO TABS
650.0000 mg | ORAL_TABLET | ORAL | Status: DC | PRN
  Administered 2020-05-12: 650 mg via ORAL
  Filled 2020-05-11 (×2): qty 2

## 2020-05-11 MED ORDER — LORAZEPAM 2 MG/ML IJ SOLN: 1.0000 mg | INTRAMUSCULAR | Status: DC | PRN

## 2020-05-11 MED ORDER — LOSARTAN POTASSIUM 50 MG PO TABS
100.0000 mg | ORAL_TABLET | Freq: Every day | ORAL | Status: DC
  Administered 2020-05-11 – 2020-05-14 (×4): 100 mg via ORAL
  Filled 2020-05-11 (×5): qty 2

## 2020-05-11 MED ORDER — LEVETIRACETAM IN NACL 1000 MG/100ML IV SOLN
1000.0000 mg | Freq: Once | INTRAVENOUS | Status: AC
  Administered 2020-05-11: 1000 mg via INTRAVENOUS
  Filled 2020-05-11: qty 100

## 2020-05-11 MED ORDER — INSULIN ASPART 100 UNIT/ML ~~LOC~~ SOLN
0.0000 [IU] | Freq: Three times a day (TID) | SUBCUTANEOUS | Status: DC
  Administered 2020-05-11 – 2020-05-12 (×2): 7 [IU] via SUBCUTANEOUS
  Administered 2020-05-12: 3 [IU] via SUBCUTANEOUS
  Administered 2020-05-12: 5 [IU] via SUBCUTANEOUS
  Administered 2020-05-13 (×2): 2 [IU] via SUBCUTANEOUS

## 2020-05-11 MED ORDER — HYDRALAZINE HCL 50 MG PO TABS
100.0000 mg | ORAL_TABLET | Freq: Three times a day (TID) | ORAL | Status: DC
  Administered 2020-05-11 – 2020-05-14 (×8): 100 mg via ORAL
  Filled 2020-05-11 (×8): qty 2

## 2020-05-11 MED ORDER — CARVEDILOL 12.5 MG PO TABS
12.5000 mg | ORAL_TABLET | Freq: Two times a day (BID) | ORAL | Status: DC
  Administered 2020-05-11 – 2020-05-14 (×6): 12.5 mg via ORAL
  Filled 2020-05-11 (×2): qty 1
  Filled 2020-05-11: qty 4
  Filled 2020-05-11 (×3): qty 1

## 2020-05-11 NOTE — Progress Notes (Signed)
EEG completed; results pending. LTM to follow.

## 2020-05-11 NOTE — Progress Notes (Signed)
Patient to start long term EEG today per neurology. EEG placement after patient returns from MRI.  Patient also will go to HD for dialysis this evening per Dr. Melvia Heaps.  Spoke to EEG and they report it would be okay for patient to be on LTM/EEG while in HD unit. HD unit also okay with patient getting LTM/EEG on HD unit this evening.   Wilber Oliphant, M.D.  2:37 PM 05/11/2020

## 2020-05-11 NOTE — Consult Note (Addendum)
Neurology Consultation Reason for Consult: Seizure Referring Physician: Dr. Yehuda Savannah  CC: Seizure  History is obtained from: Chart review, patient  HPI: Jeremiah [name] is a 54 y.o. male with history of end-stage renal disease on HD, type 2 diabetes, diabetic neuropathy who was brought into the emergency room after having a seizure-like episode.  Patient reports he has had seizure like episodes each time during his last 3 dialysis sessions (today, 11/12, 11/10) described as 30 seconds of rigidity and eyes rolling in the back of his head.  Patient states he felt lightheaded as if his sugar is low and then woke up with 10 people around him, does not remember the episode.  He denies any tongue bite, urinary incontinence, past history of epilepsy/seizures, denies new medications recently.  Of note, patient has been on dialysis for about a year, the previous episode on Friday happened after he had had dialysis for about an hour and a half and episode today happened after around 30 minutes of dialysis.  Patient was given 1 g IV Keppra and neurology was consulted for further management   ROS: All other systems reviewed and negative except as noted in the HPI.   Past Medical History:  Diagnosis Date  . Chronic kidney disease   . Chronic pain   . Diabetic neuropathy ([place])   . Heart murmur   . Hypertension   . OA (osteoarthritis)   . Pneumonia 2015  . Poorly controlled type 2 diabetes mellitus ([facility])    Family History  Problem Relation Age of Onset  . Diabetes Mother        poorly controlled-amputation  . Hyperlipidemia Mother   . Hypertension Mother   . Kidney disease Mother        dialysis. 46 died.   . Stroke Mother        cause of death.   . Heart disease Father        CABG age 21    Social History:  reports that he has never smoked. He has never used smokeless tobacco. He reports current drug use. Drug: Marijuana. He reports that he does not drink alcohol.  Exam: Current  vital signs: BP (!) 200/84   Pulse 88   Temp 97.8 F (36.6 C) (Temporal)   Resp 18   SpO2 99%  Vital signs in last 24 hours: Temp:  [97.8 F (36.6 C)] 97.8 F (36.6 C) (11/15 0946) Pulse Rate:  [85-94] 88 (11/15 1145) Resp:  [14-27] 18 (11/15 1215) BP: (179-221)/(73-119) 200/84 (11/15 1215) SpO2:  [94 %-100 %] 99 % (11/15 1145)   Physical Exam  Constitutional: Appears well-developed and well-nourished.  Psych: Affect appropriate to situation Eyes: No scleral injection HENT: No OP obstrucion Head: Normocephalic.  Cardiovascular: Normal rate and regular rhythm.  Respiratory: Effort normal, non-labored breathing GI: Soft.  No distension. There is no tenderness.  Skin: Warm  Neuro: AOx3, cranial nerves II to XII grossly intact, 5/5 in all 4 extremities  I have reviewed labs in epic and the results pertinent to this consultation are: Normal WBC, hemoglobin 8.4, BUN 100 with creatinine 14.18  I have reviewed the images obtained: CT head without contrast 05/08/2020: No acute abnormality.  ASSESSMENT/PLAN: 54 year old male with no prior history of epilepsy who presented with 3 seizure-like episodes each during dialysis sessions.  Convulsions End-stage renal disease on HD Microcytic anemia Hypertensive urgency -Differentials include seizures versus dialysis disequilibrium syndrome versus syncope versus posterior reversible encephalopathy syndrome  Recommendations: -MRI brain without contrast  to look for any acute abnormality -Discussed with medicine team that patient did not complete his dialysis session on Friday and again today.  Therefore it might be helpful to send him for hemodialysis tomorrow. We will obtain EEG and monitor patient during dialysis to capture the event.   -Will not start any AEDs unless EEG shows any ictal-interictal abnormality -Discussed with medicine team to adjust antihypertensives with goal normotension.  Of note patient did state that his blood  pressure usually is higher with systolic in the 257D.  Therefore, unless MRI shows evidence of PRES, may not necessarily need immediate lowering of blood pressure with IV medicines. -As needed IV Ativan 2 mg for clinical seizure-like episodes -Seizure precautions -Management of rest of the comorbidities per primary team   Thank you for allowing [place] to participate in the care of this patient.  Neurology will follow.  Please contact us if you have any further questions.   Zeb Comfort Epilepsy [facility]

## 2020-05-11 NOTE — ED Triage Notes (Signed)
Pt arrived via [facility] for witnessed seizure activity while at dialysis. Staff report 30 seconds of rigidity and eyes rolling back in head. Pt reports losing consciousness, did not fall. Pt  reports seizure activity at last 3 dialysis appointments (today, last Friday, and last wednesday). He did not receive his full treatment today or last Friday. Reports mild shortness of breath, RA sat of 89%.

## 2020-05-11 NOTE — Progress Notes (Signed)
Patient unavailable at this time for EEG/LTM; nurse advised he is heading to MRI shortly. Will set patient up later today.
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
## 2020-05-11 NOTE — ED Notes (Signed)
Sp02 found to be mid-80s. Pt placed on 5LPM 02 via [place]. Will notify provider.

## 2020-05-11 NOTE — Progress Notes (Signed)
LTM started; no initial skin breakdown was seen. Event button tested.
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
## 2020-05-11 NOTE — Procedures (Addendum)
Patient Name: Jeremiah [name]  MRN: [id number]  Epilepsy Attending: Lora Havens  Referring Physician/Provider: Dr. Zeb Comfort Date: 05/11/2020 Duration: 23 mins  Patient history: 54 year old male with seizure-like episodes during dialysis.  EEG to evaluate for seizures.  Level of alertness: Awake  AEDs during EEG study: Keppra  Technical aspects: This EEG study was done with scalp electrodes positioned according to the 10-20 International system of electrode placement. Electrical activity was acquired at a sampling rate of 500Hz  and reviewed with a high frequency filter of 70Hz  and a low frequency filter of 1Hz . EEG data were recorded continuously and digitally stored.   Description: The posterior dominant rhythm consists of 9 Hz activity of moderate voltage (25-35 uV) seen predominantly in posterior head regions, symmetric and reactive to eye opening and eye closing.  Hyperventilation and photic stimulation were not performed.     IMPRESSION: This study is within normal limits. No seizures or epileptiform discharges were seen throughout the recording.  Jeremiah [name] Jeremiah [name]

## 2020-05-11 NOTE — ED Notes (Signed)
Patient transported to MRI 

## 2020-05-12 DIAGNOSIS — N186 End stage renal disease: Secondary | ICD-10-CM | POA: Diagnosis present

## 2020-05-12 DIAGNOSIS — G5620 Lesion of ulnar nerve, unspecified upper limb: Secondary | ICD-10-CM | POA: Diagnosis present

## 2020-05-12 DIAGNOSIS — Z823 Family history of stroke: Secondary | ICD-10-CM | POA: Diagnosis not present

## 2020-05-12 DIAGNOSIS — Z8719 Personal history of other diseases of the digestive system: Secondary | ICD-10-CM | POA: Diagnosis not present

## 2020-05-12 DIAGNOSIS — Z5329 Procedure and treatment not carried out because of patient's decision for other reasons: Secondary | ICD-10-CM | POA: Diagnosis not present

## 2020-05-12 DIAGNOSIS — E8889 Other specified metabolic disorders: Secondary | ICD-10-CM | POA: Diagnosis present

## 2020-05-12 DIAGNOSIS — D631 Anemia in chronic kidney disease: Secondary | ICD-10-CM | POA: Diagnosis present

## 2020-05-12 DIAGNOSIS — Z992 Dependence on renal dialysis: Secondary | ICD-10-CM | POA: Diagnosis not present

## 2020-05-12 DIAGNOSIS — Z20822 Contact with and (suspected) exposure to covid-19: Secondary | ICD-10-CM | POA: Diagnosis present

## 2020-05-12 DIAGNOSIS — E11311 Type 2 diabetes mellitus with unspecified diabetic retinopathy with macular edema: Secondary | ICD-10-CM | POA: Diagnosis present

## 2020-05-12 DIAGNOSIS — I422 Other hypertrophic cardiomyopathy: Secondary | ICD-10-CM | POA: Diagnosis present

## 2020-05-12 DIAGNOSIS — E785 Hyperlipidemia, unspecified: Secondary | ICD-10-CM | POA: Diagnosis present

## 2020-05-12 DIAGNOSIS — R4182 Altered mental status, unspecified: Secondary | ICD-10-CM | POA: Diagnosis present

## 2020-05-12 DIAGNOSIS — D509 Iron deficiency anemia, unspecified: Secondary | ICD-10-CM | POA: Diagnosis present

## 2020-05-12 DIAGNOSIS — E8779 Other fluid overload: Secondary | ICD-10-CM | POA: Diagnosis present

## 2020-05-12 DIAGNOSIS — E1142 Type 2 diabetes mellitus with diabetic polyneuropathy: Secondary | ICD-10-CM | POA: Diagnosis present

## 2020-05-12 DIAGNOSIS — I160 Hypertensive urgency: Secondary | ICD-10-CM | POA: Diagnosis present

## 2020-05-12 DIAGNOSIS — Z8249 Family history of ischemic heart disease and other diseases of the circulatory system: Secondary | ICD-10-CM | POA: Diagnosis not present

## 2020-05-12 DIAGNOSIS — I120 Hypertensive chronic kidney disease with stage 5 chronic kidney disease or end stage renal disease: Secondary | ICD-10-CM | POA: Diagnosis present

## 2020-05-12 DIAGNOSIS — E878 Other disorders of electrolyte and fluid balance, not elsewhere classified: Secondary | ICD-10-CM | POA: Diagnosis present

## 2020-05-12 DIAGNOSIS — J9601 Acute respiratory failure with hypoxia: Secondary | ICD-10-CM | POA: Diagnosis present

## 2020-05-12 DIAGNOSIS — G4089 Other seizures: Secondary | ICD-10-CM | POA: Diagnosis present

## 2020-05-12 DIAGNOSIS — N2581 Secondary hyperparathyroidism of renal origin: Secondary | ICD-10-CM | POA: Diagnosis present

## 2020-05-12 DIAGNOSIS — R195 Other fecal abnormalities: Secondary | ICD-10-CM | POA: Diagnosis present

## 2020-05-12 DIAGNOSIS — E1122 Type 2 diabetes mellitus with diabetic chronic kidney disease: Secondary | ICD-10-CM | POA: Diagnosis present

## 2020-05-12 DIAGNOSIS — R569 Unspecified convulsions: Secondary | ICD-10-CM | POA: Diagnosis not present

## 2020-05-12 LAB — GLUCOSE, CAPILLARY
Glucose-Capillary: 110 mg/dL — ABNORMAL HIGH (ref 70–99)
Glucose-Capillary: 123 mg/dL — ABNORMAL HIGH (ref 70–99)
Glucose-Capillary: 247 mg/dL — ABNORMAL HIGH (ref 70–99)
Glucose-Capillary: 274 mg/dL — ABNORMAL HIGH (ref 70–99)
Glucose-Capillary: 338 mg/dL — ABNORMAL HIGH (ref 70–99)
Glucose-Capillary: 68 mg/dL — ABNORMAL LOW (ref 70–99)

## 2020-05-12 LAB — CBC WITH DIFFERENTIAL/PLATELET
Abs Immature Granulocytes: 0.01 10*3/uL (ref 0.00–0.07)
Basophils Absolute: 0 10*3/uL (ref 0.0–0.1)
Basophils Relative: 1 %
Eosinophils Absolute: 0.1 10*3/uL (ref 0.0–0.5)
Eosinophils Relative: 2 %
HCT: 23.1 % — ABNORMAL LOW (ref 39.0–52.0)
Hemoglobin: 7.3 g/dL — ABNORMAL LOW (ref 13.0–17.0)
Immature Granulocytes: 0 %
Lymphocytes Relative: 18 %
Lymphs Abs: 0.7 10*3/uL (ref 0.7–4.0)
MCH: 24.3 pg — ABNORMAL LOW (ref 26.0–34.0)
MCHC: 31.6 g/dL (ref 30.0–36.0)
MCV: 77 fL — ABNORMAL LOW (ref 80.0–100.0)
Monocytes Absolute: 0.4 10*3/uL (ref 0.1–1.0)
Monocytes Relative: 12 %
Neutro Abs: 2.4 10*3/uL (ref 1.7–7.7)
Neutrophils Relative %: 67 %
Platelets: 280 10*3/uL (ref 150–400)
RBC: 3 MIL/uL — ABNORMAL LOW (ref 4.22–5.81)
RDW: 17.2 % — ABNORMAL HIGH (ref 11.5–15.5)
WBC: 3.6 10*3/uL — ABNORMAL LOW (ref 4.0–10.5)
nRBC: 0 % (ref 0.0–0.2)

## 2020-05-12 LAB — RENAL FUNCTION PANEL
Albumin: 2.8 g/dL — ABNORMAL LOW (ref 3.5–5.0)
Anion gap: 14 (ref 5–15)
BUN: 51 mg/dL — ABNORMAL HIGH (ref 6–20)
CO2: 25 mmol/L (ref 22–32)
Calcium: 8.2 mg/dL — ABNORMAL LOW (ref 8.9–10.3)
Chloride: 99 mmol/L (ref 98–111)
Creatinine, Ser: 9.27 mg/dL — ABNORMAL HIGH (ref 0.61–1.24)
GFR, Estimated: 6 mL/min — ABNORMAL LOW (ref >60)
Glucose, Bld: 257 mg/dL — ABNORMAL HIGH (ref 70–99)
Phosphorus: 4.3 mg/dL (ref 2.5–4.6)
Potassium: 4.2 mmol/L (ref 3.5–5.1)
Sodium: 138 mmol/L (ref 135–145)

## 2020-05-12 LAB — IRON AND TIBC
Iron: 50 ug/dL (ref 45–182)
Saturation Ratios: 29 % (ref 17.9–39.5)
TIBC: 175 ug/dL — ABNORMAL LOW (ref 250–450)
UIBC: 125 ug/dL

## 2020-05-12 LAB — CBC
HCT: 23.3 % — ABNORMAL LOW (ref 39.0–52.0)
Hemoglobin: 7.2 g/dL — ABNORMAL LOW (ref 13.0–17.0)
MCH: 23.7 pg — ABNORMAL LOW (ref 26.0–34.0)
MCHC: 30.9 g/dL (ref 30.0–36.0)
MCV: 76.6 fL — ABNORMAL LOW (ref 80.0–100.0)
Platelets: 265 10*3/uL (ref 150–400)
RBC: 3.04 MIL/uL — ABNORMAL LOW (ref 4.22–5.81)
RDW: 17.4 % — ABNORMAL HIGH (ref 11.5–15.5)
WBC: 3.6 10*3/uL — ABNORMAL LOW (ref 4.0–10.5)
nRBC: 0 % (ref 0.0–0.2)

## 2020-05-12 LAB — HEMOGLOBIN A1C
Hgb A1c MFr Bld: 9.8 % — ABNORMAL HIGH (ref 4.8–5.6)
Mean Plasma Glucose: 234.56 mg/dL

## 2020-05-12 MED ORDER — LIDOCAINE HCL (PF) 1 % IJ SOLN: 5.0000 mL | INTRAMUSCULAR | Status: DC | PRN

## 2020-05-12 MED ORDER — SODIUM CHLORIDE 0.9 % IV SOLN: 100.0000 mL | INTRAVENOUS | Status: DC | PRN

## 2020-05-12 MED ORDER — LIDOCAINE-PRILOCAINE 2.5-2.5 % EX CREA: 1.0000 "application " | TOPICAL_CREAM | CUTANEOUS | Status: DC | PRN

## 2020-05-12 MED ORDER — ALTEPLASE 2 MG IJ SOLR: 2.0000 mg | Freq: Once | INTRAMUSCULAR | Status: DC | PRN

## 2020-05-12 MED ORDER — FERRIC CITRATE 1 GM 210 MG(FE) PO TABS
630.0000 mg | ORAL_TABLET | Freq: Three times a day (TID) | ORAL | Status: DC
  Administered 2020-05-12 – 2020-05-14 (×4): 630 mg via ORAL
  Filled 2020-05-12 (×6): qty 3

## 2020-05-12 MED ORDER — HEPARIN SODIUM (PORCINE) 1000 UNIT/ML DIALYSIS: 1000.0000 [IU] | INTRAMUSCULAR | Status: DC | PRN

## 2020-05-12 MED ORDER — INSULIN GLARGINE 100 UNIT/ML ~~LOC~~ SOLN
10.0000 [IU] | Freq: Every day | SUBCUTANEOUS | Status: DC
  Administered 2020-05-12 – 2020-05-14 (×3): 10 [IU] via SUBCUTANEOUS
  Filled 2020-05-12 (×3): qty 0.1

## 2020-05-12 MED ORDER — PENTAFLUOROPROP-TETRAFLUOROETH EX AERO: 1.0000 "application " | INHALATION_SPRAY | CUTANEOUS | Status: DC | PRN

## 2020-05-12 MED ORDER — HEPARIN SODIUM (PORCINE) 1000 UNIT/ML DIALYSIS: 20.0000 [IU]/kg | INTRAMUSCULAR | Status: DC | PRN

## 2020-05-12 NOTE — Progress Notes (Signed)
LTM Video moved to HD while in dialysis

## 2020-05-12 NOTE — Progress Notes (Signed)
Family Medicine Teaching Service Daily Progress Note Intern Pager: [phone]  Patient name: Jeremiah [name] Medical record number: [id number] Date of birth: December 24, 1965 Age: 54 y.o. Gender: male  Primary Care Provider: Maximiano Coss, NP Assessment and Plan: Jeremiah [name] is a 54 y.o. male presenting with seizure like episode during dialysis. PMH is significant for ESRD (HD on MWF), T2DM, Hypertrophic cardiomyopathy, microcytic anemia, Tension headaches, and HTN.   Loss of Consciousness Seizure like Activity Vs Syncope Pt presented to  emergency department with syncope versus seizure. Pt was at HD  yesterday and he reportedly suddenly became rigid, his eyes rolled back and he seemed to have generalized shaking that lasted 30 seconds approximately 30 minutes into session.  This is Pt's 3rd incident of seizure or syncope while getting HD (11/10, 11/12, and today 11/15).   Neurology was consulted recommended MRI brain, long term monitoring EEG and BP control. Nephrology also consulted for patient's ESRD status. MRI was essentially normal. Pt had HD and EEG yesterday. EEG was normal. He will have HD today too. His BP is still high at 159/82 mm HG.  -Neuro consult. Appreciate recommendation. -Nephrology consult. Appreciate recommendation.  -Continue cardiac monitoring and continuous pulse ox while on O2, OOB w/ assistance -Frequent Neuro checks, Seizure safety precautions. -Monitor vitals -Renal diet -Lorazepam PRN as needed for seizure.  Dyspnea Patient denies any SOB today. Pt O2 sat went down to 80% yesterday, he was given 5 L O2. This is likely due to volume overload. Mild crackles improved from yesterday with no BLEE on exam. Now his saturation is 100% at room air.  - O2 PRN sats > 92%  - monitor for improvement after HD.    Acute on Chronic Anemia CKD Pt's Hb 8.4>7.2 >7.3 with low MCV of 77.0. MCV- 77.0 Baseline hemoglobin around 10 (as of June 2021).  Technically, transfusion  threshold would be 7.  -Redosing ESA on 11/16 with HD per nephrology   -SCD for VTE ppx   -Send Anemia panel.  ESRD on HD (MWF) Pt gets HD at [facility] dialysis center. Compliant with HD sessions, however was not able to complete HD session today and only 1.5 hours of Friday. Pt got HD yesterday and will get again today. His creatinine improved from 14.18 to 9.27. BUN improved from 100 to 51.  ? ESRD nephrology team on board  ? HD meds & supplements per nephrology  ? Daily RFP  ? Renal diet w/ fluid restriction 123mL  ? Avoid continuous fluids   HTN Pt blood pressure has been running high in the ED (BP at admission 210/119). Likely due to shortened HD Current BP is 159/82 mmHg. Pt denies any headache, dizziness, chest pain. Pt takes Amlodipine 10 mg daily and Hydralazine 100 mg TID, losartan 100mg  daily.  -Continue home antihypertensives -Also monitor after HD    Cardiomyopathy  Pt denies any chest pain. Endorses some SOB. B/l breath sounds with mild crackles. Most recent echo in 01/07/20 with moderate LVH, EF 55-60%, mild LA dilation, trivial MR -See HTN plans above    T2DM CBG is at 247. Home meds: Lantus 20 units,  Pt Novolog 5 units TID with meals. -Start sensitive SSI TID AC -Lantus 10 units daily.  -Monitor CBG's   FEN/GI: Renal Diet Prophylaxis: SCDs    Disposition: Med Tele    Subjective:  Pt is comfortably sitting iin his bed. Denies any headache, chest pain, dizziness, sob, nausea, vomiting and abdominal pain. Pt states he is hungry  and wants to eat something.   Objective: Temp:  [97.8 F (36.6 C)-98.8 F (37.1 C)] 98.3 F (36.8 C) (11/16 0829) Pulse Rate:  [72-102] 78 (11/16 0829) Resp:  [14-28] 18 (11/16 0829) BP: (154-228)/(68-119) 154/82 (11/16 0829) SpO2:  [90 %-100 %] 100 % (11/16 0829) Weight:  [79.4 kg] 79.4 kg (11/15 1954) Physical Exam: General: Pt is laying comfortably in his bed. He is not in acute distress. Pt has leads on his head and  compression stockings.  Cardiovascular: RRR, S1S2 normal, No Murmurs, rubs and gallops Respiratory: No wheezes, rhonchi. Mild crackles at bases. Improved from yesterday. Abdomen: soft, No swelling, non tender Extremities: Compression stockings in place. No edema. Neuro: No focal deficits, Laboratory: Recent Labs  Lab 05/11/20 0927 05/11/20 1340 05/12/20 0459  WBC 5.2 5.6 3.6*  3.6*  HGB 8.4* 7.2* 7.3*  7.2*  HCT 27.1* 23.6* 23.1*  23.3*  PLT 291 266 280  265   Recent Labs  Lab 05/08/20 0800 05/11/20 0927 05/12/20 0459  NA 139 140 138  K 4.2 4.8 4.2  CL 96* 97* 99  CO2 28 23 25   BUN 64* 100* 51*  CREATININE 8.51* 14.18* 9.27*  CALCIUM 8.4* 8.5* 8.2*  PROT 6.7 7.1  --   BILITOT 0.7 0.9  --   ALKPHOS 70 64  --   ALT 44 33  --   AST 49* 28  --   GLUCOSE 182* 204* 257*     Imaging/Diagnostic Tests: CT Head Wo Contrast  Result Date: 05/08/2020 CLINICAL DATA:  Seizure EXAM: CT HEAD WITHOUT CONTRAST TECHNIQUE: Contiguous axial images were obtained from the base of the skull through the vertex without intravenous contrast. COMPARISON:  07/04/2019.  01/20/2018. FINDINGS: Brain: The brain shows a normal appearance without evidence of malformation, atrophy, old or acute small or large vessel infarction, mass lesion, hemorrhage, hydrocephalus or extra-axial collection. Vascular: No hyperdense vessel. No evidence of atherosclerotic calcification. Skull: Normal.  No traumatic finding.  No focal bone lesion. Sinuses/Orbits: Sinuses are clear. Orbits appear normal. Mastoids are clear. Other: None significant IMPRESSION: Normal head CT. Electronically Signed   By: Nelson Chimes M.D.   On: 05/08/2020 09:21   MR BRAIN WO CONTRAST  Result Date: 05/11/2020 CLINICAL DATA:  Seizure-like episode EXAM: MRI HEAD WITHOUT CONTRAST TECHNIQUE: Multiplanar, multiecho pulse sequences of the brain and surrounding structures were obtained without intravenous contrast. COMPARISON:  2019 FINDINGS:  Motion artifact is present. Brain: There is no acute infarction or intracranial hemorrhage. There is no intracranial mass, mass effect, or edema. There is no hydrocephalus or extra-axial fluid collection. Ventricles and sulci are normal in size and configuration. Hippocampi demonstrate symmetric normal size and signal. Vascular: Major vessel flow voids at the skull base are preserved. Skull and upper cervical spine: Normal marrow signal is preserved. Sinuses/Orbits: Paranasal sinuses are aerated. Left lens replacement. Other: Sella is unremarkable.  Mastoid air cells are clear. IMPRESSION: Motion degraded study demonstrates no significant abnormality. Electronically Signed   By: Macy Mis M.D.   On: 05/11/2020 15:52   DG Chest Port 1 View  Result Date: 05/11/2020 CLINICAL DATA:  Shortness of breath.  Recent seizure.  On dialysis. EXAM: PORTABLE CHEST 1 VIEW COMPARISON:  05/08/2020 FINDINGS: The heart size and mediastinal contours are within normal limits. Symmetric diffuse interstitial airspace opacity is increased since previous study. This may be due to pulmonary edema or atypical infection. No evidence of pleural effusion. IMPRESSION: Increased symmetric diffuse interstitial and airspace opacity. Differential diagnosis includes pulmonary  edema and atypical infection. Electronically Signed   By: Marlaine Hind M.D.   On: 05/11/2020 12:33   DG Chest Port 1 View  Result Date: 05/08/2020 CLINICAL DATA:  Seizure. EXAM: PORTABLE CHEST 1 VIEW COMPARISON:  12/25/2019 FINDINGS: Generous heart size likely related to technique. Prominent density of the lungs, cannot exclude aspiration in this setting. No edema, effusion, or pneumothorax. IMPRESSION: Hazy density on both sides favors artifact, but aspiration is not excluded in this clinical setting. Electronically Signed   By: Monte Fantasia M.D.   On: 05/08/2020 08:46   Portable EEG  Result Date: 05/11/2020 Lora Havens, MD     05/12/2020  8:29 AM  Patient Name: Jeremiah [name] MRN: [id number] Epilepsy Attending: Lora Havens Referring Physician/Provider: Dr. Zeb Comfort Date: 05/11/2020 Duration: 23 mins Patient history: 54 year old male with seizure-like episodes during dialysis.  EEG to evaluate for seizures. Level of alertness: Awake AEDs during EEG study: Keppra Technical aspects: This EEG study was done with scalp electrodes positioned according to the 10-20 International system of electrode placement. Electrical activity was acquired at a sampling rate of 500Hz  and reviewed with a high frequency filter of 70Hz  and a low frequency filter of 1Hz . EEG data were recorded continuously and digitally stored. Description: The posterior dominant rhythm consists of 9 Hz activity of moderate voltage (25-35 uV) seen predominantly in posterior head regions, symmetric and reactive to eye opening and eye closing.  Hyperventilation and photic stimulation were not performed.   IMPRESSION: This study is within normal limits. No seizures or epileptiform discharges were seen throughout the recording. Leverne Humbles, MD 05/12/2020, 8:45 AM PGY-1, [place] Intern pager: [phone], text pages welcome

## 2020-05-12 NOTE — Procedures (Signed)
Patient Name: Jeremiah [name]  MRN: [id number]  Epilepsy Attending: Lora Havens  Referring Physician/Provider: Dr. Zeb Comfort Duration: 05/11/2020 1710 to 05/12/2020 1710  Patient history: 54 year old male with seizure-like episodes during dialysis.  EEG to evaluate for seizures.  Level of alertness: Awake, asleep  AEDs during EEG study: Keppra  Technical aspects: This EEG study was done with scalp electrodes positioned according to the 10-20 International system of electrode placement. Electrical activity was acquired at a sampling rate of 500Hz  and reviewed with a high frequency filter of 70Hz  and a low frequency filter of 1Hz . EEG data were recorded continuously and digitally stored.   Description: The posterior dominant rhythm consists of 9 Hz activity of moderate voltage (25-35 uV) seen predominantly in posterior head regions, symmetric and reactive to eye opening and eye closing.    Sleep was characterized by vertex waves, sleep spindles (12 to 14 Hz), maximal frontocentral region.  Hyperventilation and photic stimulation were not performed.     IMPRESSION: This study is within normal limits. No seizures or epileptiform discharges were seen throughout the recording.  [name] Barbra Sarks

## 2020-05-12 NOTE — Evaluation (Signed)
Physical Therapy Evaluation Patient Details Name: Jeremiah [name] MRN: [id number] DOB: 1966-03-15 Today's Date: 05/12/2020   History of Present Illness  [name] is a very pleasant 54 yo male who presented to ED after seizure-like activity noted during his HD session today about 30 minutes in.  Clinical Impression  Patient received in bed, continuous EEG running. RN okay with mobility at this time. Patient mobility limited by lines. He is agreeable to move and thankful to get up into recliner. Patient is independent with bed mobility, transfers with supervision and ambulated a few feet from bed to recliner, supervision only. He appears to be at baseline/independent level of function. I don't anticipate any further PT needs for this patient.  He would benefit from further mobilization by nursing staff while here and if needs arise, please re-order PT. Signing off.     Follow Up Recommendations No PT follow up    Equipment Recommendations  None recommended by PT    Recommendations for Other Services       Precautions / Restrictions Precautions Precautions: None Restrictions Weight Bearing Restrictions: No      Mobility  Bed Mobility Overal bed mobility: Independent                  Transfers Overall transfer level: Needs assistance Equipment used: None Transfers: Sit to/from Stand Sit to Stand: Supervision            Ambulation/Gait Ambulation/Gait assistance: Supervision Gait Distance (Feet): 5 Feet Assistive device: None Gait Pattern/deviations: WFL(Within Functional Limits)     General Gait Details: patient able to walk to recliner from bed with supervision. No overt difficulties noted.  Stairs            Wheelchair Mobility    Modified Rankin (Stroke Patients Only)       Balance Overall balance assessment: No apparent balance deficits (not formally assessed)                                           Pertinent  Vitals/Pain Pain Assessment: No/denies pain    Home Living Family/patient expects to be discharged to:: Private residence Living Arrangements: Spouse/significant other Available Help at Discharge: Family Type of Home: House Home Access: Stairs to enter   Technical brewer of Steps: 2 Home Layout: One level Home Equipment: None      Prior Function Level of Independence: Independent               Hand Dominance        Extremity/Trunk Assessment   Upper Extremity Assessment Upper Extremity Assessment: Overall WFL for tasks assessed    Lower Extremity Assessment Lower Extremity Assessment: Overall WFL for tasks assessed    Cervical / Trunk Assessment Cervical / Trunk Assessment: Normal  Communication   Communication: No difficulties  Cognition Arousal/Alertness: Awake/alert Behavior During Therapy: WFL for tasks assessed/performed Overall Cognitive Status: Within Functional Limits for tasks assessed                                        General Comments      Exercises     Assessment/Plan    PT Assessment Patent does not need any further PT services  PT Problem List         PT Treatment  Interventions      PT Goals (Current goals can be found in the Care Plan section)  Acute Rehab PT Goals Patient Stated Goal: to return home PT Goal Formulation: With patient Time For Goal Achievement: 05/16/20 Potential to Achieve Goals: Good    Frequency     Barriers to discharge        Co-evaluation               AM-PAC PT "6 Clicks" Mobility  Outcome Measure Help needed turning from your back to your side while in a flat bed without using bedrails?: None Help needed moving from lying on your back to sitting on the side of a flat bed without using bedrails?: None Help needed moving to and from a bed to a chair (including a wheelchair)?: None Help needed standing up from a chair using your arms (e.g., wheelchair or bedside  chair)?: None Help needed to walk in hospital room?: None Help needed climbing 3-5 steps with a railing? : None 6 Click Score: 24    End of Session   Activity Tolerance: Patient tolerated treatment well;Other (comment) (patient currently having contiuous EEG running, therefore gait limited) Patient left: in chair;with call bell/phone within reach Nurse Communication: Mobility status      Time: 9470-7615 PT Time Calculation (min) (ACUTE ONLY): 18 min   Charges:   PT Evaluation $PT Eval Moderate Complexity: 1 Mod PT Treatments $Therapeutic Activity: 8-22 mins        Browning Southwood, PT, GCS 05/12/20,11:55 AM

## 2020-05-12 NOTE — Progress Notes (Signed)
LTM maintenance completed; no skin breakdown was seen at Fp2, A1, or T8.

## 2020-05-12 NOTE — Progress Notes (Signed)
OT Cancellation Note  Patient Details Name: Jeremiah [name] MRN: [id number] DOB: 1965-07-04   Cancelled Treatment:    Reason Eval/Treat Not Completed: Patient at procedure or test/ unavailable (Pt on continuous EEG. Limited mobility so OTR to see later.)  OT to continue to follow for OT eval.  Jefferey Pica, OTR/L Acute Rehabilitation Services Pager: [phone] Office: [phone]   Alka Falwell C 05/12/2020, 12:10 PM

## 2020-05-12 NOTE — Progress Notes (Signed)
[facility] Progress Note  Subjective: seen in room, no c/o seizures or jerking, has EEG leads on. On HD x 1 year.   Vitals:   05/12/20 1300 05/12/20 1307 05/12/20 1311 05/12/20 1313  BP: (!) 150/72 (!) 147/69 (!) 147/69 (!) 149/67  Pulse:      Resp: 16  12 16   Temp: 99.2 F (37.3 C)     TempSrc: Oral     SpO2: 98%     Weight: 81.3 kg       Exam: General: WDWN man, nad  Head: NCAT sclera not icteric MMM Neck: Supple. No JVD appreciated  Lungs: Crackles at bases, bilaterally  Breathing is unlabored. Heart: RRR with S1 S2 Abdomen: soft non-tender  Lower extremities: trace -1+ LE edema bilaterally  Neuro: A & O  X 3. Moves all extremities spontaneously. Psych:  Responds to questions appropriately with a normal affect. Dialysis Access: R AVF+b   Dialysis:  [facility] MWF    4h   450/800  75kg   2K/2.25Ca  R AVF   Heparin 4000 Mircera 200 (150 mcg on 10/27)    Hectorol  4     Venofer 100 x5  (2/5 doses received )   Assessment/Plan: 1. Seizures -Happening during dialysis x 3.  Neurology following. On HD x 1 year. Will get OP HD info , he states he is fully compliant w/ HD.  BUN was 100 on admit, but 2-3 sessions had been cut short due to these seizures.  2. ESRD -  MWF HD x 1 year.  Has AVF.  3. Hypertension/volume  - Hypertensive on arrival. Home meds amlodipine 10, carvedilol 25, losartan 100. Vol excess+, up 5-6 kg by wts. Lower vol w/ HD today.  4.  Anemia  - Hgb 8.4, Redose ESA with HD 78/67.  5.  Metabolic bone disease -  Ca ok. Continue Hectorol, Auyrixa binder  6.  T2DM - per primary      Jeremiah [name] 05/12/2020, 2:30 PM   Recent Labs  Lab 05/11/20 0927 05/11/20 0927 05/11/20 1340 05/12/20 0459  K 4.8  --   --  4.2  BUN 100*  --   --  51*  CREATININE 14.18*  --   --  9.27*  CALCIUM 8.5*  --   --  8.2*  PHOS  --   --   --  4.3  HGB 8.4*   < > 7.2* 7.3*  7.2*   < > = values in this interval not displayed.   Inpatient medications: .  amLODipine  10 mg Oral Daily  . carvedilol  12.5 mg Oral BID WC  . Chlorhexidine Gluconate Cloth  6 each Topical Q0600  . ferric citrate  630 mg Oral TID WC  . hydrALAZINE  100 mg Oral TID  . insulin aspart  0-9 Units Subcutaneous TID WC  . insulin glargine  10 Units Subcutaneous Daily  . losartan  100 mg Oral Daily   . sodium chloride    . sodium chloride     sodium chloride, sodium chloride, acetaminophen **OR** acetaminophen, alteplase, heparin, heparin, lidocaine (PF), lidocaine-prilocaine, LORazepam, pentafluoroprop-tetrafluoroeth

## 2020-05-12 NOTE — Progress Notes (Signed)
Subjective: No clinical seizures overnight.  Patient states he had HD yesterday and did not have any further episodes.  ROS: negative except above  Examination  Vital signs in last 24 hours: Temp:  [98 F (36.7 C)-99.2 F (37.3 C)] 99.2 F (37.3 C) (11/16 1300) Pulse Rate:  [72-102] 73 (11/16 1123) Resp:  [12-20] 20 (11/16 1513) BP: (137-211)/(63-93) 149/70 (11/16 1513) SpO2:  [90 %-100 %] 98 % (11/16 1300) Weight:  [79.4 kg-81.3 kg] 81.3 kg (11/16 1300)  General: lying in bed, not in apparent distress CVS: pulse-normal rate and rhythm RS: breathing comfortably, CTAB Extremities: normal, warm  Neuro: MS: Alert, oriented, follows commands CN: pupils equal and reactive,  EOMI, face symmetric, tongue midline, normal sensation over face, Motor: 5/5 strength in all 4 extremities Reflexes: 2+ bilaterally over patella, biceps, plantars: flexor Coordination: normal Gait: not tested  Basic Metabolic Panel: Recent Labs  Lab 05/08/20 0800 05/11/20 0927 05/12/20 0459  NA 139 140 138  K 4.2 4.8 4.2  CL 96* 97* 99  CO2 28 23 25   GLUCOSE 182* 204* 257*  BUN 64* 100* 51*  CREATININE 8.51* 14.18* 9.27*  CALCIUM 8.4* 8.5* 8.2*  PHOS  --   --  4.3    CBC: Recent Labs  Lab 05/08/20 0800 05/11/20 0927 05/11/20 1340 05/12/20 0459  WBC 3.5* 5.2 5.6 3.6*  3.6*  NEUTROABS 2.4 3.8  --  2.4  HGB 8.2* 8.4* 7.2* 7.3*  7.2*  HCT 27.0* 27.1* 23.6* 23.1*  23.3*  MCV 78.7* 78.1* 77.9* 77.0*  76.6*  PLT 216 291 266 280  265     Coagulation Studies: No results for input(s): LABPROT, INR in the last 72 hours.  Imaging MRI brain without contrast 05/11/2020: Motion degraded study demonstrates no significant abnormality.  ASSESSMENT AND PLAN: 54 year old male with no prior history of epilepsy who presented with 3 seizure-like episodes each during dialysis sessions.  Convulsions End-stage renal disease on HD Microcytic anemia Hypertensive urgency -Differentials include  seizures versus dialysis disequilibrium syndrome versus syncope   Recommendations: -LTM EEG so far did not show any ictal-interictal abnormality.  We will continue to monitor as patient has another dialysis session today. -Will not start any AEDs unless EEG shows any ictal-interictal abnormality -As needed IV Ativan 2 mg for clinical seizure-like episodes -Seizure precautions -Management of rest of the comorbidities per primary team  I have spent a total of 30 minutes with the patient reviewing hospital notes,  test results, labs and examining the patient as well as establishing an assessment and plan that was discussed personally with the patient.  > 50% of time was spent in direct patient care.   Zeb Comfort Epilepsy [facility] For questions after 5pm please refer to AMION to reach the Neurologist on call

## 2020-05-13 ENCOUNTER — Inpatient Hospital Stay (HOSPITAL_COMMUNITY): Payer: Medicare Other

## 2020-05-13 ENCOUNTER — Encounter (HOSPITAL_COMMUNITY): Payer: Self-pay | Admitting: Psychiatry

## 2020-05-13 DIAGNOSIS — R569 Unspecified convulsions: Secondary | ICD-10-CM | POA: Diagnosis not present

## 2020-05-13 LAB — CBC WITH DIFFERENTIAL/PLATELET
Abs Immature Granulocytes: 0.01 10*3/uL (ref 0.00–0.07)
Basophils Absolute: 0 10*3/uL (ref 0.0–0.1)
Basophils Relative: 1 %
Eosinophils Absolute: 0.1 10*3/uL (ref 0.0–0.5)
Eosinophils Relative: 2 %
HCT: 25.3 % — ABNORMAL LOW (ref 39.0–52.0)
Hemoglobin: 7.8 g/dL — ABNORMAL LOW (ref 13.0–17.0)
Immature Granulocytes: 0 %
Lymphocytes Relative: 21 %
Lymphs Abs: 0.8 10*3/uL (ref 0.7–4.0)
MCH: 24 pg — ABNORMAL LOW (ref 26.0–34.0)
MCHC: 30.8 g/dL (ref 30.0–36.0)
MCV: 77.8 fL — ABNORMAL LOW (ref 80.0–100.0)
Monocytes Absolute: 0.6 10*3/uL (ref 0.1–1.0)
Monocytes Relative: 15 %
Neutro Abs: 2.4 10*3/uL (ref 1.7–7.7)
Neutrophils Relative %: 61 %
Platelets: 298 10*3/uL (ref 150–400)
RBC: 3.25 MIL/uL — ABNORMAL LOW (ref 4.22–5.81)
RDW: 17.3 % — ABNORMAL HIGH (ref 11.5–15.5)
WBC: 3.9 10*3/uL — ABNORMAL LOW (ref 4.0–10.5)
nRBC: 0 % (ref 0.0–0.2)

## 2020-05-13 LAB — GLUCOSE, CAPILLARY
Glucose-Capillary: 187 mg/dL — ABNORMAL HIGH (ref 70–99)
Glucose-Capillary: 170 mg/dL — ABNORMAL HIGH (ref 70–99)
Glucose-Capillary: 295 mg/dL — ABNORMAL HIGH (ref 70–99)
Glucose-Capillary: 189 mg/dL — ABNORMAL HIGH (ref 70–99)
Glucose-Capillary: 62 mg/dL — ABNORMAL LOW (ref 70–99)

## 2020-05-13 LAB — COMPREHENSIVE METABOLIC PANEL
ALT: 25 U/L (ref 0–44)
AST: 20 U/L (ref 15–41)
Albumin: 3 g/dL — ABNORMAL LOW (ref 3.5–5.0)
Alkaline Phosphatase: 50 U/L (ref 38–126)
Anion gap: 12 (ref 5–15)
BUN: 34 mg/dL — ABNORMAL HIGH (ref 6–20)
CO2: 26 mmol/L (ref 22–32)
Calcium: 8.3 mg/dL — ABNORMAL LOW (ref 8.9–10.3)
Chloride: 99 mmol/L (ref 98–111)
Creatinine, Ser: 7.67 mg/dL — ABNORMAL HIGH (ref 0.61–1.24)
GFR, Estimated: 8 mL/min — ABNORMAL LOW (ref >60)
Glucose, Bld: 188 mg/dL — ABNORMAL HIGH (ref 70–99)
Potassium: 4.6 mmol/L (ref 3.5–5.1)
Sodium: 137 mmol/L (ref 135–145)
Total Bilirubin: 0.5 mg/dL (ref 0.3–1.2)
Total Protein: 6 g/dL — ABNORMAL LOW (ref 6.5–8.1)

## 2020-05-13 LAB — HEPATITIS B SURFACE ANTIBODY, QUANTITATIVE: Hep B S AB Quant (Post): >1000 m[IU]/mL (ref >9.9)

## 2020-05-13 MED ORDER — DARBEPOETIN ALFA 200 MCG/0.4ML IJ SOSY
PREFILLED_SYRINGE | INTRAMUSCULAR | Status: AC
  Filled 2020-05-13: qty 0.4

## 2020-05-13 MED ORDER — HEPARIN SODIUM (PORCINE) 1000 UNIT/ML DIALYSIS: 4000.0000 [IU] | Freq: Once | INTRAMUSCULAR | Status: AC

## 2020-05-13 MED ORDER — HEPARIN SODIUM (PORCINE) 1000 UNIT/ML IJ SOLN
INTRAMUSCULAR | Status: AC
  Administered 2020-05-13: 4000 [IU] via INTRAVENOUS_CENTRAL
  Filled 2020-05-13: qty 4

## 2020-05-13 MED ORDER — DARBEPOETIN ALFA 200 MCG/0.4ML IJ SOSY
200.0000 ug | PREFILLED_SYRINGE | INTRAMUSCULAR | Status: DC
  Administered 2020-05-13: 200 ug via INTRAVENOUS
  Filled 2020-05-13: qty 0.4

## 2020-05-13 MED ORDER — PEG 3350-KCL-NA BICARB-NACL 420 G PO SOLR
4000.0000 mL | Freq: Once | ORAL | Status: AC
  Administered 2020-05-13: 4000 mL via ORAL
  Filled 2020-05-13 (×2): qty 4000

## 2020-05-13 NOTE — Progress Notes (Addendum)
[facility] Progress Note  Subjective: seen in room, no seizures on HD yest or Monday. BP's good here on 4 home BP meds.    Vitals:   05/12/20 1942 05/12/20 2318 05/13/20 0347 05/13/20 0750  BP: 140/70 139/71 (!) 166/75 (!) 148/66  Pulse: 75 70 70 72  Resp: 20 18 19 18   Temp: 98.6 F (37 C) 98.8 F (37.1 C) 98.1 F (36.7 C) 99.1 F (37.3 C)  TempSrc: Oral Oral Oral Oral  SpO2: 99% 99% 98% 100%  Weight:        Exam: General: WDWN man, nad  Head: NCAT sclera not icteric MMM Neck: Supple. No JVD appreciated  Lungs: Crackles at bases, bilaterally  Breathing is unlabored. Heart: RRR with S1 S2 Abdomen: soft non-tender  Lower extremities: trace -1+ LE edema bilaterally  Neuro: A & O  X 3. Moves all extremities spontaneously. Psych:  Responds to questions appropriately with a normal affect. Dialysis Access: R AVF+b   Dialysis:  [facility] MWF    4h   450/800  75kg   2K/2.25Ca  R AVF   Heparin 4000 Mircera 200 (150 mcg on 10/27)    Hectorol  4     Venofer 100 x5  (2/5 doses received )   Assessment/Plan: 1. Seizures -Happening during dialysis x 3.  EEG here negative. On HD x 1 year. Outpt records noted large UF goals 4- 7kg, some high BP's coming in. Dry wt 75.  BP's here are good on home meds x 4. BS's on arrival were 200's so low BS unlikely cause, no other metabolic or medication related cause found here. Per pmd and neuro.  2. ESRD -  MWF HD x 1 year.  Has AVF. Will see how he does today w/ full session of HD. UF to dry wt.  3. Hypertension/volume - as above 4.  Anemia  - Hgb 8.4 > 7's, Redosing ESA with HD today 11/17, not sure cause of anemia, Hb down from 10 a few mos ago. Is on high dose ESA at OP unit.   Guiac stool ordered. Would not dc w/ dropping Hb.  5.  Metabolic bone disease -  Ca ok. Continue Hectorol, Auyrixa binder  6.  T2DM - per primary      Rob Kalika Smay 05/13/2020, 11:16 AM   Recent Labs  Lab 05/12/20 0459 05/13/20 0346  K 4.2 4.6  BUN  51* 34*  CREATININE 9.27* 7.67*  CALCIUM 8.2* 8.3*  PHOS 4.3  --   HGB 7.3*  7.2* 7.8*   Inpatient medications: . amLODipine  10 mg Oral Daily  . carvedilol  12.5 mg Oral BID WC  . Chlorhexidine Gluconate Cloth  6 each Topical Q0600  . darbepoetin (ARANESP) injection - DIALYSIS  200 mcg Intravenous Q Wed-HD  . ferric citrate  630 mg Oral TID WC  . hydrALAZINE  100 mg Oral TID  . insulin aspart  0-9 Units Subcutaneous TID WC  . insulin glargine  10 Units Subcutaneous Daily  . losartan  100 mg Oral Daily    acetaminophen **OR** acetaminophen, LORazepam

## 2020-05-13 NOTE — Progress Notes (Signed)
OT Cancellation Note  Patient Details Name: Jeremiah [name] MRN: [id number] DOB: 1965/11/24   Cancelled Treatment:    Reason Eval/Treat Not Completed: Patient at procedure or test/ unavailable (HD) Transport in room to take patient to HD at this time. Ot to check back at the next most appropriate time.  Billey Chang, OTR/L  Acute Rehabilitation Services Pager: [phone] Office: [phone] .  05/13/2020, 11:42 AM

## 2020-05-13 NOTE — Progress Notes (Addendum)
Interim Progress Note   Anemia  Patient notes dark tarry stool x a few months on interview this morning. He receives Aranesp q Thursday with HD. Concern for GIB given declining hemoglobin over the last few months. Patient's last colonoscopy in 2013. Obtained records from Dr. Ulyses Amor office for coloscopy from 10/20/2011. Impression: 1) normal colon, 2) medium hemorrhoids.   VSS.   CBC: Recent Labs  Lab 05/08/20 0800 05/11/20 0927 05/11/20 1340 05/12/20 0459 05/13/20 0346  WBC 3.5* 5.2 5.6 3.6*  3.6* 3.9*  NEUTROABS 2.4 3.8  --  2.4 2.4  HGB 8.2* 8.4* 7.2* 7.3*  7.2* 7.8*  HCT 27.0* 27.1* 23.6* 23.1*  23.3* 25.3*  MCV 78.7* 78.1* 77.9* 77.0*  76.6* 77.8*  PLT 216 291 266 280  265 298    FOBT pending. Hemoglobin baseline ~ 10. Nephrology recommendations to work up anemia prior to discharge.  Transfuse is Hgb <7.  Holding home ASA since admission. No VTE ppx on since admission. Will consult GI to evaluate patient for colonoscopy who will see patient tomorrow morning.   Wilber Oliphant, M.D.  3:01 PM 05/13/2020

## 2020-05-13 NOTE — Hospital Course (Addendum)
Recs per neruology:  He has had 3 dialysis sessions well admitted and did not have recurrence of his episodes. His MRI did not show any acute abnormality and his EEG did not show any epileptiform discharges. At this point, it is unlikely that he has epileptic seizures. His episodes could have been provoked seizure during dialysis versus dialysis disequilibrium kind of syndrome. We do not need to start him on AEDs. However he does need to follow seizure precautions including do not drive for 6 months.

## 2020-05-13 NOTE — Progress Notes (Signed)
vEEG LTM complete. No skin breakdown

## 2020-05-13 NOTE — Progress Notes (Addendum)
Family Medicine Teaching Service Daily Progress Note Intern Pager: [phone]  Patient name: Jeremiah [name] Medical record number: [id number] Date of birth: 1965/08/03 Age: 54 y.o. Gender: male  Primary Care Provider: Maximiano Coss, NP Consultants: Tommie Raymond , Neuro Code Status: Full  Assessment and Plan: Jeremiah [name] is a 54 y.o. male presenting with seizure like episode during dialysis. PMH is significant for ESRD (HD on MWF), T2DM, Hypertrophic cardiomyopathy, microcytic anemia, Tension headaches, and HTN.   Loss of Consciousness Seizure like Activity Vs Syncope Pt presented to  emergency department with syncope versus seizure. Pt was at HD on 11/15 and he reportedly had seizure like activity for 30 sec. This was Pt's 3rd incident of seizure or syncope while getting HD (11/10, 11/12, and 11/15).   Neurology was consulted recommended MRI brain, long term monitoring EEG and BP control. Nephrology also consulted for patient's ESRD status. MRI was essentially normal. Pt had HD on 11/15 and 11/16 and EEG yesterday. EEG was normal. His BP is still high at 148/66 mm HG. Neuro is following. The plan is to discharge him when Neuro sign off.  -Neuro following.  Appreciate recommendation. -Nephrology consulted. Appreciate recommendation and management.  -Continue cardiac monitoring and continuous pulse ox while on O2, OOB w/ assistance -Frequent Neuro checks, Seizure safety precautions. -Monitor vitals -Renal diet -Lorazepam PRN as needed for seizure.   Dyspnea Patient denies any SOB today. Sating at 98% at room air. This is likely due to volume overload. Mild crackles improved from yesterday with no BLEE on exam.  - O2 PRN sats > 92%  - monitor for improvement after HD.    Acute on Chronic Anemia CKD Pt's Hb 8.4>7.2 >7.3>7.8 with low MCV of 77.8. Baseline hemoglobin around 10 (as of June 2021). transfusion threshold would be 7. Iron studies shows low TIBC of 175 and normal Iron. -SCD  for VTE ppx   -Repeat CBC tomorrow. Transfusion threshold 7. -F/U with GI for Endoscopy to look for potential bleeding source.   ESRD on HD (MWF) Pt gets HD at [facility] center.. Pt got HD on 11/15 and 11/16. His creatinine improved from 14.18 to 7.67. BUN improved from 100 to 34.  ? ESRD nephrology team on board  ? HD meds & supplements per nephrology  ? Daily RFP  ? Renal diet w/ fluid restriction 1218mL  ? Avoid continuous fluids   HTN Pt blood pressure has been running high in the ED (BP at admission 210/119). Likely due to shortened HD Current BP is 166/75 mmHg. Pt denies any headache, dizziness, chest pain. Pt takes Amlodipine 10 mg daily and Hydralazine 100 mg TID, losartan 100mg  daily.  -Continue home antihypertensives -Also monitor after HD    Cardiomyopathy  Pt denies any chest pain. Endorses some SOB. B/l breath sounds with mild crackles. Most recent echo in 01/07/20 with moderate LVH, EF 55-60%, mild LA dilation, trivial MR -See HTN plans above    T2DM CBG is at 170. Home meds: Lantus 20 units,  Pt Novolog 5 units TID with meals. -Continue sensitive SSI TID AC -Lantus 10 units daily.  -Monitor CBG's   FEN/GI: Renal Diet Prophylaxis: SCDs    Disposition: Med Tele   Subjective:  Pt is laying comfortablly on his bed. Denies any complaints. Denies headache, dizziness, chest pain, diarrhea, vomiting and nausea.   Objective: Temp:  [98.1 F (36.7 C)-99.2 F (37.3 C)] 99.1 F (37.3 C) (11/17 0750) Pulse Rate:  [70-75] 72 (11/17 0750) Resp:  [12-20]  18 (11/17 0750) BP: (137-166)/(63-77) 148/66 (11/17 0750) SpO2:  [98 %-100 %] 100 % (11/17 0750) Weight:  [77.8 kg-81.3 kg] 77.8 kg (11/16 1611) Physical Exam: General: Pt is laying comfortably in his bed. He is not in acute distress. Pt has leads on his head and compression stockings.  Cardiovascular: RRR, S1S2 normal, No Murmurs, rubs and gallops Respiratory: No wheezes, rhonchi. Mild crackles at bases.  Improved from admission. Abdomen: soft, No swelling, non tender Extremities: Compression stockings in place. No edema. Neuro:No focal deficits  Laboratory: Recent Labs  Lab 05/11/20 1340 05/12/20 0459 05/13/20 0346  WBC 5.6 3.6*  3.6* 3.9*  HGB 7.2* 7.3*  7.2* 7.8*  HCT 23.6* 23.1*  23.3* 25.3*  PLT 266 280  265 298   Recent Labs  Lab 05/08/20 0800 05/08/20 0800 05/11/20 0927 05/12/20 0459 05/13/20 0346  NA 139   < > 140 138 137  K 4.2   < > 4.8 4.2 4.6  CL 96*   < > 97* 99 99  CO2 28   < > 23 25 26   BUN 64*   < > 100* 51* 34*  CREATININE 8.51*   < > 14.18* 9.27* 7.67*  CALCIUM 8.4*   < > 8.5* 8.2* 8.3*  PROT 6.7  --  7.1  --  6.0*  BILITOT 0.7  --  0.9  --  0.5  ALKPHOS 70  --  64  --  50  ALT 44  --  33  --  25  AST 49*  --  28  --  20  GLUCOSE 182*   < > 204* 257* 188*   < > = values in this interval not displayed.     Imaging/Diagnostic Tests: No new results.  Armando Reichert, MD 05/13/2020, 9:37 AM PGY-1, [place] Intern pager: [phone], text pages welcome
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
## 2020-05-13 NOTE — Progress Notes (Addendum)
Subjective: No events during HD yesterday.  ROS: negative except above  Examination  Vital signs in last 24 hours: Temp:  [98.1 F (36.7 C)-99.2 F (37.3 C)] 99.2 F (37.3 C) (11/17 1155) Pulse Rate:  [70-75] 71 (11/17 1300) Resp:  [13-20] 13 (11/17 1300) BP: (137-172)/(63-77) 166/71 (11/17 1300) SpO2:  [98 %-100 %] 100 % (11/17 1300) Weight:  [76.9 kg-77.8 kg] 76.9 kg (11/17 1155)  General: lying in bed, not in apparent distress CVS: pulse-normal rate and rhythm RS: breathing comfortably, CTAB Extremities: normal, warm  Neuro: AOx3, cranial nerves II to XII grossly intact, 5/5 in upper extremities  Basic Metabolic Panel: Recent Labs  Lab 05/08/20 0800 05/08/20 0800 05/11/20 0927 05/12/20 0459 05/13/20 0346  NA 139  --  140 138 137  K 4.2  --  4.8 4.2 4.6  CL 96*  --  97* 99 99  CO2 28  --  23 25 26   GLUCOSE 182*  --  204* 257* 188*  BUN 64*  --  100* 51* 34*  CREATININE 8.51*  --  14.18* 9.27* 7.67*  CALCIUM 8.4*   < > 8.5* 8.2* 8.3*  PHOS  --   --   --  4.3  --    < > = values in this interval not displayed.    CBC: Recent Labs  Lab 05/08/20 0800 05/11/20 0927 05/11/20 1340 05/12/20 0459 05/13/20 0346  WBC 3.5* 5.2 5.6 3.6*  3.6* 3.9*  NEUTROABS 2.4 3.8  --  2.4 2.4  HGB 8.2* 8.4* 7.2* 7.3*  7.2* 7.8*  HCT 27.0* 27.1* 23.6* 23.1*  23.3* 25.3*  MCV 78.7* 78.1* 77.9* 77.0*  76.6* 77.8*  PLT 216 291 266 280  265 298     Coagulation Studies: No results for input(s): LABPROT, INR in the last 72 hours.  Imaging No new brain imaging overnight  ASSESSMENT AND PLAN:  54 year old male with no prior history of epilepsy who presented with 3 seizure-like episodes each during dialysis sessions.  Convulsions End-stage renal disease on HD Microcytic anemia Hypertensive urgency -Differentials include seizures versus dialysis disequilibrium syndromeversus syncope   Recommendations: -LTM EEG so far did not show any ictal-interictal  abnormality. -Spoke with nephrologist Dr. Jonnie Finner who stated that for the last 2 days patient underwent mild dialysis sessions.  However, he will have his regular HD session today.  We will continue EEG monitoring during HD to capture the event. -However, if EEG continues to be negative (does not show any ictal-interictal abnormality), I am hesitant to start any AEDs because all the episodes happened during HD, normal MRI and EEG and therefore it is unlikely that these episodes were epileptic.  Even if these were seizures, it is possible that these were provoked seizures and therefore we will hold off AEDs at this point. -As needed IV Ativan 2 mg for clinical seizure-like episodes -Seizure precautions -Management of rest of the comorbidities per primary team   ADDENDUM -No events during HD again today.  At this point, it is unlikely that patient has epileptic seizures.  Differentials for his events include dialysis disequilibrium syndrome versus provoked seizures. -Patient does not need to be on antiepileptic drugs at this point given normal MRI and EEG. -However if these episodes recur especially outside of dialysis, can consider starting Keppra 500 mg twice daily. -Given episodes of alteration of awareness, we would still recommend seizure precautions including do not drive for 6 months  Thank you for allowing [place] to participate in the care of this  patient.  Neurology will sign off.  Please contact us if you have any further questions.  I have spent a total of25 minuteswith the patient reviewing hospitalnotes,  test results, labs and examining the patient as well as establishing an assessment and plan that was discussed personally with the patient.>50% of time was spent in direct patient care.    Zeb Comfort Epilepsy [facility] For questions after 5pm please refer to AMION to reach the Neurologist on call

## 2020-05-13 NOTE — Procedures (Addendum)
Patient Name:Jeremiah [name] YFV:[id number] Epilepsy Attending:Kinzleigh Kandler Barbra Sarks Referring Physician/Provider:Dr. Zeb Comfort Duration: 05/12/2020 1710 to 05/13/2020 0858 05/13/2020 1211 to 4  Patient history:54 year old male with seizure-like episodes during dialysis. EEG to evaluate for seizures.  Level of alertness:Awake, asleep  AEDs during EEG study:Keppra  Technical aspects: This EEG study was done with scalp electrodes positioned according to the 10-20 International system of electrode placement. Electrical activity was acquired at a sampling rate of 500Hz  and reviewed with a high frequency filter of 70Hz  and a low frequency filter of 1Hz . EEG data were recorded continuously and digitally stored.   Description: The posterior dominant rhythm consists of 9 Hz activity of moderate voltage (25-35 uV) seen predominantly in posterior head regions, symmetric and reactive to eye opening and eye closing. Sleep was characterized by vertex waves, sleep spindles (12 to 14 Hz), maximal frontocentral region. Hyperventilation and photic stimulation were not performed.   IMPRESSION: This study is within normal limits. No seizures or epileptiform discharges were seen throughout the recording.  Jeremiah [name] Barbra Sarks

## 2020-05-13 NOTE — Progress Notes (Signed)
Prolonged EEG while in HD started. Gave patient event button. Notified Neuro

## 2020-05-14 ENCOUNTER — Encounter (HOSPITAL_COMMUNITY): Payer: Self-pay | Admitting: Psychiatry

## 2020-05-14 LAB — RENAL FUNCTION PANEL
Albumin: 3.1 g/dL — ABNORMAL LOW (ref 3.5–5.0)
Anion gap: 10 (ref 5–15)
BUN: 16 mg/dL (ref 6–20)
CO2: 27 mmol/L (ref 22–32)
Calcium: 8.4 mg/dL — ABNORMAL LOW (ref 8.9–10.3)
Chloride: 101 mmol/L (ref 98–111)
Creatinine, Ser: 5.38 mg/dL — ABNORMAL HIGH (ref 0.61–1.24)
GFR, Estimated: 12 mL/min — ABNORMAL LOW (ref >60)
Glucose, Bld: 115 mg/dL — ABNORMAL HIGH (ref 70–99)
Phosphorus: 4 mg/dL (ref 2.5–4.6)
Potassium: 4.2 mmol/L (ref 3.5–5.1)
Sodium: 138 mmol/L (ref 135–145)

## 2020-05-14 LAB — CBC
HCT: 26.5 % — ABNORMAL LOW (ref 39.0–52.0)
Hemoglobin: 8 g/dL — ABNORMAL LOW (ref 13.0–17.0)
MCH: 23.7 pg — ABNORMAL LOW (ref 26.0–34.0)
MCHC: 30.2 g/dL (ref 30.0–36.0)
MCV: 78.6 fL — ABNORMAL LOW (ref 80.0–100.0)
Platelets: 344 10*3/uL (ref 150–400)
RBC: 3.37 MIL/uL — ABNORMAL LOW (ref 4.22–5.81)
RDW: 17.3 % — ABNORMAL HIGH (ref 11.5–15.5)
WBC: 3.9 10*3/uL — ABNORMAL LOW (ref 4.0–10.5)
nRBC: 0 % (ref 0.0–0.2)

## 2020-05-14 LAB — SURGICAL PCR SCREEN
MRSA, PCR: NEGATIVE
Staphylococcus aureus: NEGATIVE

## 2020-05-14 LAB — GLUCOSE, CAPILLARY
Glucose-Capillary: 120 mg/dL — ABNORMAL HIGH (ref 70–99)
Glucose-Capillary: 103 mg/dL — ABNORMAL HIGH (ref 70–99)

## 2020-05-14 LAB — SARS CORONAVIRUS 2 (TAT 6-24 HRS): SARS Coronavirus 2: NEGATIVE

## 2020-05-14 LAB — OCCULT BLOOD X 1 CARD TO LAB, STOOL: Fecal Occult Bld: POSITIVE — AB

## 2020-05-14 SURGERY — COLONOSCOPY
Anesthesia: Moderate Sedation

## 2020-05-14 NOTE — Progress Notes (Addendum)
Family Medicine Teaching Service Daily Progress Note Intern Pager: [phone]  Patient name: Jeremiah [name] Medical record number: [id number] Date of birth: 21-Jul-1965 Age: 54 y.o. Gender: male  Primary Care Provider: Maximiano Coss, NP Consultants: Tommie Raymond , Neuro, GI Code Status: Full   Assessment and Plan: Jeremiah [name] is a 54 y.o. male presenting with seizure like episode during dialysis. PMH is significant for ESRD (HD on MWF), T2DM, microcytic anemia, Tension headaches, and HTN.   Loss of Consciousness Seizure like Activity Vs Syncope Pt presented to  emergency department with syncope versus seizure. Pt was at HD on 11/15 and he reportedly had seizure like activity for 30 sec. This was Pt's 3rd incident of seizure or syncope while getting HD (11/10, 11/12, and 11/15).  Neurology was consulted recommended MRI brain, long term monitoring EEG and BP control. Nephrology also consulted for patient's ESRD status. MRI was essentially normal. Pt had HD on 11/15 and 11/16 and EEG yesterday. EEG was normal. His BP is still high at 172/79 mm HG. Pt didn't take any BP meds this morning as he was NPO. Today Pt denies any complaints. Pt wants to go home after colonoscopy.  -Neuro following.  Appreciate recommendation. -Nephrology consulted. Appreciate recommendation and management.  -Continue cardiac monitoring and continuous pulse ox while on O2, OOB w/ assistance -Frequent Neuro checks, Seizure safety precautions. -Monitor vitals -NPO today , renal diet after -Lorazepam PRN as needed for seizure.   Dyspnea (Resolved) Patient denies any SOB today. Sating at 99% at room air. No crackles heard today. - O2 PRN sats > 92%  - monitor for improvement after HD.    Acute on Chronic Anemia CKD Pt's Hb 8.4>7.2 >7.3>7.8 >8.0with low MCV of 78.6. Baseline hemoglobin around 10 (as of June 2021). transfusion threshold would be 7. Iron studies shows low TIBC of 175 and normal Iron. GI was consulted  and recommended Endoscopy and Colonoscopy planned for Today. -GI consulted. Appreciate recommendations. -Endoscopy and Colonoscopy scheduled today. -SCD for VTE ppx   -Repeat CBC tomorrow. Transfusion threshold 7. -NPO, Can take oral BP meds.  ESRD on HD (MWF) Pt gets HD at [facility].. Pt got HD on 11/15 and 11/16. His creatinine improved from 14.18 to 5.38. BUN improved from 100 to 16.  ? ESRD nephrology team on board  ? HD meds & supplements per nephrology  ? Daily RFP  ? NPO now  ? Avoid continuous fluids   HTN Pt blood pressure has been running high in the ED (BP at admission 210/119). Likely due to shortened HD Current BP is 172/79 mmHg. Didn't take meds today as Pt is NPO. Pt denies any headache, dizziness, chest pain. Pt takes Amlodipine 10 mg daily and Hydralazine 100 mg TID, losartan 100mg  daily.  -Continue home antihypertensives -Also monitor after HD    Cardiomyopathy  Pt denies any chest pain. B/l breath sounds with mild crackles. Most recent echo in 01/07/20 with moderate LVH, EF 55-60%, mild LA dilation, trivial MR -See HTN plans above    T2DM CBG is at 115. Home meds: Lantus 20 units,  Pt Novolog 5 units TID with meals. -Continue sensitive SSI TID AC -Lantus 10 units daily.  -Monitor CBG's   FEN/GI:NPO for colonoscopy and Endoscopy Prophylaxis: SCD   Disposition: Med Tele  Subjective:  Today Pt denies any complaints. Pt wants to go home after colonoscopy.   Objective: Temp:  [98.3 F (36.8 C)-99.2 F (37.3 C)] 98.3 F (36.8 C) (11/18 0160) Pulse  Rate:  [69-77] 75 (11/18 0608) Resp:  [11-20] 20 (11/18 0329) BP: (142-175)/(64-80) 162/75 (11/18 0329) SpO2:  [99 %-100 %] 99 % (11/18 0329) Weight:  [75 kg-76.9 kg] 75 kg (11/17 1600) Physical Exam: General:Pt is laying comfortably in his bed. He is not in acute distress. Cardiovascular:RRR, S1S2 normal, No Murmurs, rubs and gallops Respiratory:No wheezes, rhonchi.no crackles, wheezes  and rhonchi. Abdomen:soft, No swelling, non tender Extremities:Compression stockings in place. No edema.  Laboratory: Recent Labs  Lab 05/12/20 0459 05/13/20 0346 05/14/20 0416  WBC 3.6*  3.6* 3.9* 3.9*  HGB 7.3*  7.2* 7.8* 8.0*  HCT 23.1*  23.3* 25.3* 26.5*  PLT 280  265 298 344   Recent Labs  Lab 05/08/20 0800 05/08/20 0800 05/11/20 0927 05/11/20 0927 05/12/20 0459 05/13/20 0346 05/14/20 0416  NA 139   < > 140   < > 138 137 138  K 4.2   < > 4.8   < > 4.2 4.6 4.2  CL 96*   < > 97*   < > 99 99 101  CO2 28   < > 23   < > 25 26 27   BUN 64*   < > 100*   < > 51* 34* 16  CREATININE 8.51*   < > 14.18*   < > 9.27* 7.67* 5.38*  CALCIUM 8.4*   < > 8.5*   < > 8.2* 8.3* 8.4*  PROT 6.7  --  7.1  --   --  6.0*  --   BILITOT 0.7  --  0.9  --   --  0.5  --   ALKPHOS 70  --  64  --   --  50  --   ALT 44  --  33  --   --  25  --   AST 49*  --  28  --   --  20  --   GLUCOSE 182*   < > 204*   < > 257* 188* 115*   < > = values in this interval not displayed.      Imaging/Diagnostic Tests: No new results.  Armando Reichert, MD 05/14/2020, 6:13 AM PGY-1, [place] Intern pager: [phone], text pages welcome

## 2020-05-14 NOTE — Plan of Care (Signed)
  Problem: Clinical Measurements: Goal: Will remain free from infection Outcome: Progressing   Problem: Clinical Measurements: Goal: Ability to maintain clinical measurements within normal limits will improve Outcome: Progressing   Problem: Health Behavior/Discharge Planning: Goal: Ability to manage health-related needs will improve Outcome: Progressing

## 2020-05-14 NOTE — Progress Notes (Signed)
[facility] Progress Note  Subjective: seen in room, wants to go home.  Stool guiac+ yesterday. Hb 8 today. No cramps on HD yesterday, got to his dry wt.    Vitals:   05/14/20 0329 05/14/20 0607 05/14/20 0608 05/14/20 0820  BP: (!) 162/75   (!) 172/79  Pulse: 75  75 77  Resp: 20   18  Temp: 98.3 F (36.8 C)  98.3 F (36.8 C) 98.4 F (36.9 C)  TempSrc: Oral Oral Oral Oral  SpO2: 99%   100%  Weight: 74.8 kg     Height: 5\' 11"  (1.803 m)       Exam: General: WDWN man, nad  Head: NCAT sclera not icteric MMM Neck: Supple. No JVD appreciated  Lungs: Crackles at bases, bilaterally  Breathing is unlabored. Heart: RRR with S1 S2 Abdomen: soft non-tender  Lower extremities: trace -1+ LE edema bilaterally  Neuro: A & O  X 3. Moves all extremities spontaneously. Psych:  Responds to questions appropriately with a normal affect. Dialysis Access: R AVF+b   Dialysis:  [facility] MWF    4h   450/800  75kg   2K/2.25Ca  R AVF   Heparin 4000 Mircera 200 (150 mcg on 10/27)    Hectorol  4     Venofer 100 x5  (2/5 doses received )   Assessment/Plan: 1. Seizures - happened during dialysis x 3 at OP center last week and this Monday. Pt felt presyncopal symptoms prior to all 3 events. His wt gains (and I would assume his UF goals) were 6.1, 6.7 and 4.6 kg on these 3 sessions. He had HD x 3 here but UF goal max here was only 3 kg.  He got to his dry wt finally after HD yest w/o cramping.  BP's here at dry wt on all 3 home meds are high around 160's/ 80-90's.  No seizures here on HD. EEG's here negative. Suspect that pt may be having convulsive syncope related to combination of severe/ chronic HTN / poor autoregulation and large fluid wt goals on HD (4- 7L goals) causing episodic poor cerebral perfusion w/ syncope and assoc seizures. Pt left AMA today it appears.  Will follow up in the OP setting. Have d/w Dr Hollie Salk.  Best would be the pt reducing his fluid intakes so as to lower fluid goals.  Consider also possibly  Increase his dry wt but this can be difficult w/ high wt gainers.  No further suggestions.  2. ESRD -  MWF HD x 1 year.  Has AVF.  3. Hypertension/volume - as above 4.  Anemia  - Hgb 8.4 > 7's, Redosing ESA with HD today 11/17, not sure cause of anemia, Hb down from 10 a few mos ago. Is on high dose ESA at OP unit.   Guiac stool ordered. Would not dc w/ dropping Hb.  5.  Metabolic bone disease -  Ca ok. Continue Hectorol, Auyrixa binder  6.  T2DM - per primary      Rob Keen Ewalt 05/14/2020, 12:08 PM   Recent Labs  Lab 05/12/20 0459 05/12/20 0459 05/13/20 0346 05/14/20 0416  K 4.2   < > 4.6 4.2  BUN 51*   < > 34* 16  CREATININE 9.27*   < > 7.67* 5.38*  CALCIUM 8.2*   < > 8.3* 8.4*  PHOS 4.3  --   --  4.0  HGB 7.3*  7.2*   < > 7.8* 8.0*   < > = values in this  interval not displayed.   Inpatient medications: . amLODipine  10 mg Oral Daily  . carvedilol  12.5 mg Oral BID WC  . Chlorhexidine Gluconate Cloth  6 each Topical Q0600  . darbepoetin (ARANESP) injection - DIALYSIS  200 mcg Intravenous Q Wed-HD  . ferric citrate  630 mg Oral TID WC  . hydrALAZINE  100 mg Oral TID  . insulin aspart  0-9 Units Subcutaneous TID WC  . insulin glargine  10 Units Subcutaneous Daily  . losartan  100 mg Oral Daily    acetaminophen **OR** acetaminophen, LORazepam

## 2020-05-14 NOTE — Discharge Summary (Addendum)
[facility] Discharge Summary  Patient name: Jeremiah [name] Medical record number: [id number] Date of birth: [birth year] Age: 54 y.o. Gender: male Date of Admission: 05/11/2020  Date of Discharge: 05/14/20 Admitting Physician: Armando Reichert, MD  Primary Care Provider: Maximiano Coss, NP Consultants: Neurology, Nephrology, GI  Indication for Hospitalization: Seizure /Syncope episode  Discharge Diagnoses/Problem List:  Seizure like episode (dialysis disequilibrium syndrome vs provoked seizures) ESRD T2DM Microcytic Anemia  Disposition: PT LEFT AMA.  Discharge Condition: Stable  Discharge Exam: General: He is not in acute distress. Cardiovascular: RRR, S1S2 normal, No Murmurs, rubs and gallops Respiratory: No wheezes, rhonchi. no crackles, wheezes and rhonchi. Abdomen: soft, No swelling, non tender Extremities: Compression stockings in place. No edema  Brief Hospital Course:  Assessment and Plan: Jeremiah [name] is a 54 y.o. male presenting with seizure like episode during dialysis. PMH is significant for ESRD (HD on MWF), T2DM, microcytic anemia, Tension headaches, and HTN.  Seizure like Activity Vs Syncope Pt was admitted with seizure like activity lasting 30 sec while at HD. He was not able to complete HD. Pt had 2 previous similar episodes of seizure or syncope while getting HD on 11/10, and 11/12. On arrival to the ED, BP was elevated to 221/102, pulse 90, O2 saturation was 89% which improved with 2.5 L O2 [place]. Labs significant for BUN of 100, creatinine 14.18 and anion gap of 20 and his GFR 4. X ray showed Increased symmetric diffuse interstitial and airspace opacity. He was given 1,000 mg Keppra . Neurology consulted. MRI brain was normal. EEG did not show any ictal-interictal abnormality. Neurology didn't recommend starting Antiepileptic drugs at this point but will consider starting Keppra if these episodes continues. Recommended Seizure safety  precautions. Nephrology was consulted. HD was done on 05/11/20 and 05/12/20. His creatinine improved from 14.18 to 5.38. BUN improved from 100 to 16 at discharge. His BP was controlled by continuing his home medications Amlodipine, Hydralazine, and losartan.  Acute on Chronic Anemia CKD Pt's Hb at admission was 8.4. Pt admitted to having black tarry stools. GI was consulted and recommended Endoscopy and Colonoscopy. Pt was prepped overnight however Pt left AMA so Endoscopy and Colonoscopy wasn't done. Hb at discharge was 8.0.  Issues for Follow Up:  F/U with GI for Colonoscopy and Endoscopy to find source of GI bleeding Seizure safety precautions. F/U with Neurology if seizure like episode continues. F/U with Nephrology for ESRD.  Significant Procedures: MRI Brain- Normal EEG- Normal  Significant Labs and Imaging:  No results for input(s): WBC, HGB, HCT, PLT in the last 168 hours. No results for input(s): NA, K, CL, CO2, GLUCOSE, BUN, CREATININE, CALCIUM, MG, PHOS, ALKPHOS, AST, ALT, ALBUMIN, PROTEIN in the last 168 hours.  Invalid input(s): TBILI   Results/Tests Pending at Time of Discharge: None  Discharge Medications:  Allergies as of 05/14/2020   No Known Allergies      Medication List     ASK your doctor about these medications    Accu-Chek Aviva Plus test strip Generic drug: glucose blood Use as instructed   accu-chek multiclix lancets Use as instructed three times daily.   Alcohol Pads 70 % Pads 1 each by Does not apply route 4 (four) times daily as needed.   amLODipine 10 MG tablet Commonly known as: NORVASC TAKE 1 TABLET BY MOUTH ONCE DAILY   aspirin EC 81 MG tablet Take 1 tablet (81 mg total) by mouth daily.   atorvastatin 40 MG tablet Commonly known  as: LIPITOR Take 1 tablet (40 mg total) by mouth daily.   Auryxia 1 GM 210 MG(Fe) tablet Generic drug: ferric citrate Take 630 mg by mouth in the morning, at noon, and at bedtime.   Basaglar KwikPen  100 UNIT/ML Inject 0.2 mLs (20 Units total) into the skin daily.   blood glucose meter kit and supplies Kit Dispense based on patient and insurance preference. Use up to four times daily as directed. (FOR ICD-9 250.00, 250.01).   carvedilol 12.5 MG tablet Commonly known as: COREG Take 1 tablet (12.5 mg total) by mouth 2 (two) times daily with a meal.   cyclobenzaprine 5 MG tablet Commonly known as: FLEXERIL Take 1 tablet (5 mg total) by mouth in the morning and at bedtime.   Darbepoetin Alfa 60 MCG/0.3ML Sosy injection Commonly known as: ARANESP Inject 0.3 mLs (60 mcg total) into the vein every Thursday with hemodialysis.   Dialyvite 800 0.8 MG Tabs Take 1 tablet by mouth daily.   DULoxetine 20 MG capsule Commonly known as: Cymbalta Take 1 capsule (20 mg total) by mouth daily.   gabapentin 100 MG capsule Commonly known as: NEURONTIN Take 1 capsule (100 mg total) by mouth 3 (three) times daily.   hydrALAZINE 100 MG tablet Commonly known as: APRESOLINE Take 100 mg by mouth 3 (three) times daily. Ask about: Which instructions should I use?   losartan 100 MG tablet Commonly known as: COZAAR Take 100 mg by mouth daily.   NovoLOG FlexPen 100 UNIT/ML FlexPen Generic drug: insulin aspart Inject 5 Units into the skin 3 (three) times daily with meals.   Pen Needles 32G X 4 MM Misc 100 pens by Does not apply route 4 (four) times daily. Use to inject insulin four times daily        Discharge Instructions: Please refer to Patient Instructions section of EMR for full details.  Patient was counseled important signs and symptoms that should prompt return to medical care, changes in medications, dietary instructions, activity restrictions, and follow up appointments.   Follow-Up Appointments:Pt left AMA.    Armando Reichert, MD 06/07/2020, 1:17 PM PGY-1, [place] Upper-Level Resident Addendum I have discussed the above with the original author and agree  with their documentation. My edits for correction/addition/clarification are included. Please see also any attending notes.   Wilber Oliphant, M.D.  PGY-3 06/11/2020 6:43 AM

## 2020-05-14 NOTE — Progress Notes (Signed)
Pt started colon prep at 2130 and completed most of it by 1; 20am.  About 1/8 of the gallon of prep remaining.   Stool is watery and slightly clear.

## 2020-05-15 ENCOUNTER — Telehealth: Payer: Self-pay | Admitting: Nephrology

## 2020-05-15 NOTE — Telephone Encounter (Signed)
Transition of care contact from inpatient facility  Date of discharge: 05/14/20 Left AMA  Date of contact: 05/15/20 Method: Phone Spoke to: Patient's wife   Patient contacted to discuss transition of care from recent inpatient hospitalization. Patient was admitted to [facility] from 11/15-11/18/21 with discharge diagnosis of seizure like activity vs syncope  Medication reconciliation pending   Patient will follow up with his/her outpatient HD unit on: He went today dialysis today.

## 2020-06-08 ENCOUNTER — Other Ambulatory Visit: Payer: Self-pay | Admitting: Gastroenterology

## 2020-06-29 ENCOUNTER — Encounter (HOSPITAL_COMMUNITY): Payer: Self-pay | Admitting: Gastroenterology

## 2020-07-04 ENCOUNTER — Observation Stay (HOSPITAL_COMMUNITY)
Admission: EM | Admit: 2020-07-04 | Discharge: 2020-07-05 | Disposition: A | Discharge status: Home / Self Care | Payer: Medicare Other | Attending: Internal Medicine | Admitting: Internal Medicine

## 2020-07-04 ENCOUNTER — Encounter (HOSPITAL_COMMUNITY): Payer: Self-pay

## 2020-07-04 ENCOUNTER — Emergency Department (HOSPITAL_COMMUNITY): Payer: Medicare Other

## 2020-07-04 ENCOUNTER — Other Ambulatory Visit: Payer: Self-pay

## 2020-07-04 DIAGNOSIS — R739 Hyperglycemia, unspecified: Secondary | ICD-10-CM | POA: Diagnosis present

## 2020-07-04 DIAGNOSIS — J810 Acute pulmonary edema: Secondary | ICD-10-CM

## 2020-07-04 DIAGNOSIS — E119 Type 2 diabetes mellitus without complications: Secondary | ICD-10-CM | POA: Diagnosis not present

## 2020-07-04 DIAGNOSIS — I161 Hypertensive emergency: Secondary | ICD-10-CM | POA: Diagnosis not present

## 2020-07-04 DIAGNOSIS — Z992 Dependence on renal dialysis: Secondary | ICD-10-CM | POA: Diagnosis not present

## 2020-07-04 DIAGNOSIS — E8770 Fluid overload, unspecified: Secondary | ICD-10-CM | POA: Diagnosis present

## 2020-07-04 DIAGNOSIS — Z7982 Long term (current) use of aspirin: Secondary | ICD-10-CM | POA: Insufficient documentation

## 2020-07-04 DIAGNOSIS — N186 End stage renal disease: Secondary | ICD-10-CM | POA: Insufficient documentation

## 2020-07-04 DIAGNOSIS — E1165 Type 2 diabetes mellitus with hyperglycemia: Secondary | ICD-10-CM | POA: Diagnosis present

## 2020-07-04 DIAGNOSIS — I120 Hypertensive chronic kidney disease with stage 5 chronic kidney disease or end stage renal disease: Secondary | ICD-10-CM | POA: Insufficient documentation

## 2020-07-04 DIAGNOSIS — Z79899 Other long term (current) drug therapy: Secondary | ICD-10-CM | POA: Diagnosis not present

## 2020-07-04 DIAGNOSIS — Z20822 Contact with and (suspected) exposure to covid-19: Secondary | ICD-10-CM | POA: Diagnosis not present

## 2020-07-04 DIAGNOSIS — Z794 Long term (current) use of insulin: Secondary | ICD-10-CM | POA: Diagnosis not present

## 2020-07-04 DIAGNOSIS — E1010 Type 1 diabetes mellitus with ketoacidosis without coma: Secondary | ICD-10-CM

## 2020-07-04 LAB — URINALYSIS, ROUTINE W REFLEX MICROSCOPIC
Bacteria, UA: NONE SEEN
Bilirubin Urine: NEGATIVE
Glucose, UA: >=500 mg/dL — AB
Hgb urine dipstick: NEGATIVE
Ketones, ur: NEGATIVE mg/dL
Leukocytes,Ua: NEGATIVE
Nitrite: NEGATIVE
Protein, ur: >=300 mg/dL — AB
Specific Gravity, Urine: 1.009 (ref 1.005–1.030)
pH: 9 — ABNORMAL HIGH (ref 5.0–8.0)

## 2020-07-04 LAB — I-STAT VENOUS BLOOD GAS, ED
Acid-Base Excess: 5 mmol/L — ABNORMAL HIGH (ref 0.0–2.0)
Bicarbonate: 29.5 mmol/L — ABNORMAL HIGH (ref 20.0–28.0)
Calcium, Ion: 0.9 mmol/L — ABNORMAL LOW (ref 1.15–1.40)
HCT: 31 % — ABNORMAL LOW (ref 39.0–52.0)
Hemoglobin: 10.5 g/dL — ABNORMAL LOW (ref 13.0–17.0)
O2 Saturation: 89 %
Potassium: 5.5 mmol/L — ABNORMAL HIGH (ref 3.5–5.1)
Sodium: 132 mmol/L — ABNORMAL LOW (ref 135–145)
TCO2: 31 mmol/L (ref 22–32)
pCO2, Ven: 40.3 mmHg — ABNORMAL LOW (ref 44.0–60.0)
pH, Ven: 7.473 — ABNORMAL HIGH (ref 7.250–7.430)
pO2, Ven: 52 mmHg — ABNORMAL HIGH (ref 32.0–45.0)

## 2020-07-04 LAB — RAPID URINE DRUG SCREEN, HOSP PERFORMED
Amphetamines: NOT DETECTED
Barbiturates: NOT DETECTED
Benzodiazepines: NOT DETECTED
Cocaine: NOT DETECTED
Opiates: NOT DETECTED
Tetrahydrocannabinol: NOT DETECTED

## 2020-07-04 LAB — BASIC METABOLIC PANEL
Anion gap: 16 — ABNORMAL HIGH (ref 5–15)
Anion gap: 16 — ABNORMAL HIGH (ref 5–15)
Anion gap: 14 (ref 5–15)
BUN: 50 mg/dL — ABNORMAL HIGH (ref 6–20)
BUN: 55 mg/dL — ABNORMAL HIGH (ref 6–20)
BUN: 53 mg/dL — ABNORMAL HIGH (ref 6–20)
CO2: 26 mmol/L (ref 22–32)
CO2: 26 mmol/L (ref 22–32)
CO2: 26 mmol/L (ref 22–32)
Calcium: 8 mg/dL — ABNORMAL LOW (ref 8.9–10.3)
Calcium: 8.6 mg/dL — ABNORMAL LOW (ref 8.9–10.3)
Calcium: 8.6 mg/dL — ABNORMAL LOW (ref 8.9–10.3)
Chloride: 91 mmol/L — ABNORMAL LOW (ref 98–111)
Chloride: 100 mmol/L (ref 98–111)
Chloride: 97 mmol/L — ABNORMAL LOW (ref 98–111)
Creatinine, Ser: 7.73 mg/dL — ABNORMAL HIGH (ref 0.61–1.24)
Creatinine, Ser: 8.19 mg/dL — ABNORMAL HIGH (ref 0.61–1.24)
Creatinine, Ser: 8.74 mg/dL — ABNORMAL HIGH (ref 0.61–1.24)
GFR, Estimated: 8 mL/min — ABNORMAL LOW (ref >60)
GFR, Estimated: 7 mL/min — ABNORMAL LOW (ref >60)
GFR, Estimated: 7 mL/min — ABNORMAL LOW (ref >60)
Glucose, Bld: 739 mg/dL (ref 70–99)
Glucose, Bld: 80 mg/dL (ref 70–99)
Glucose, Bld: 155 mg/dL — ABNORMAL HIGH (ref 70–99)
Potassium: 5.2 mmol/L — ABNORMAL HIGH (ref 3.5–5.1)
Potassium: 4 mmol/L (ref 3.5–5.1)
Potassium: 4.1 mmol/L (ref 3.5–5.1)
Sodium: 133 mmol/L — ABNORMAL LOW (ref 135–145)
Sodium: 142 mmol/L (ref 135–145)
Sodium: 137 mmol/L (ref 135–145)

## 2020-07-04 LAB — CBC
HCT: 30 % — ABNORMAL LOW (ref 39.0–52.0)
Hemoglobin: 8.9 g/dL — ABNORMAL LOW (ref 13.0–17.0)
MCH: 23.7 pg — ABNORMAL LOW (ref 26.0–34.0)
MCHC: 29.7 g/dL — ABNORMAL LOW (ref 30.0–36.0)
MCV: 79.8 fL — ABNORMAL LOW (ref 80.0–100.0)
Platelets: 308 10*3/uL (ref 150–400)
RBC: 3.76 MIL/uL — ABNORMAL LOW (ref 4.22–5.81)
RDW: 19.9 % — ABNORMAL HIGH (ref 11.5–15.5)
WBC: 4.9 10*3/uL (ref 4.0–10.5)
nRBC: 0.4 % — ABNORMAL HIGH (ref 0.0–0.2)

## 2020-07-04 LAB — CBG MONITORING, ED
Glucose-Capillary: >600 mg/dL (ref 70–99)
Glucose-Capillary: 580 mg/dL (ref 70–99)
Glucose-Capillary: 131 mg/dL — ABNORMAL HIGH (ref 70–99)
Glucose-Capillary: 124 mg/dL — ABNORMAL HIGH (ref 70–99)
Glucose-Capillary: 47 mg/dL — ABNORMAL LOW (ref 70–99)
Glucose-Capillary: 99 mg/dL (ref 70–99)
Glucose-Capillary: 85 mg/dL (ref 70–99)
Glucose-Capillary: 84 mg/dL (ref 70–99)
Glucose-Capillary: 138 mg/dL — ABNORMAL HIGH (ref 70–99)

## 2020-07-04 LAB — BRAIN NATRIURETIC PEPTIDE: B Natriuretic Peptide: 1680.9 pg/mL — ABNORMAL HIGH (ref 0.0–100.0)

## 2020-07-04 LAB — HIV ANTIBODY (ROUTINE TESTING W REFLEX): HIV Screen 4th Generation wRfx: NONREACTIVE

## 2020-07-04 LAB — RESP PANEL BY RT-PCR (FLU A&B, COVID) ARPGX2
Influenza A by PCR: NEGATIVE
Influenza B by PCR: NEGATIVE
SARS Coronavirus 2 by RT PCR: NEGATIVE

## 2020-07-04 LAB — TROPONIN I (HIGH SENSITIVITY)
Troponin I (High Sensitivity): 55 ng/L — ABNORMAL HIGH (ref <18)
Troponin I (High Sensitivity): 58 ng/L — ABNORMAL HIGH (ref <18)

## 2020-07-04 LAB — HEMOGLOBIN A1C
Hgb A1c MFr Bld: 10.7 % — ABNORMAL HIGH (ref 4.8–5.6)
Mean Plasma Glucose: 260.39 mg/dL

## 2020-07-04 LAB — OSMOLALITY: Osmolality: 330 mOsm/kg (ref 275–295)

## 2020-07-04 LAB — BETA-HYDROXYBUTYRIC ACID: Beta-Hydroxybutyric Acid: 0.12 mmol/L (ref 0.05–0.27)

## 2020-07-04 MED ORDER — LABETALOL HCL 5 MG/ML IV SOLN
10.0000 mg | INTRAVENOUS | Status: DC | PRN
  Administered 2020-07-04 – 2020-07-05 (×2): 10 mg via INTRAVENOUS
  Filled 2020-07-04 (×2): qty 4

## 2020-07-04 MED ORDER — CHLORHEXIDINE GLUCONATE CLOTH 2 % EX PADS: 6.0000 | MEDICATED_PAD | Freq: Every day | CUTANEOUS | Status: DC

## 2020-07-04 MED ORDER — DEXTROSE IN LACTATED RINGERS 5 % IV SOLN: INTRAVENOUS | Status: DC

## 2020-07-04 MED ORDER — LACTATED RINGERS IV SOLN: INTRAVENOUS | Status: DC

## 2020-07-04 MED ORDER — DEXTROSE 50 % IV SOLN
0.0000 mL | INTRAVENOUS | Status: DC | PRN
  Administered 2020-07-04: 55 mL via INTRAVENOUS
  Filled 2020-07-04: qty 50

## 2020-07-04 MED ORDER — HYDRALAZINE HCL 50 MG PO TABS
100.0000 mg | ORAL_TABLET | Freq: Three times a day (TID) | ORAL | Status: DC
  Administered 2020-07-04 – 2020-07-05 (×3): 100 mg via ORAL
  Filled 2020-07-04 (×3): qty 2

## 2020-07-04 MED ORDER — AMLODIPINE BESYLATE 5 MG PO TABS
10.0000 mg | ORAL_TABLET | Freq: Every day | ORAL | Status: DC
  Administered 2020-07-05: 10 mg via ORAL
  Filled 2020-07-04 (×2): qty 2

## 2020-07-04 MED ORDER — INSULIN GLARGINE 100 UNIT/ML ~~LOC~~ SOLN
10.0000 [IU] | Freq: Once | SUBCUTANEOUS | Status: AC
  Administered 2020-07-04: 10 [IU] via SUBCUTANEOUS
  Filled 2020-07-04: qty 0.1

## 2020-07-04 MED ORDER — INSULIN REGULAR(HUMAN) IN NACL 100-0.9 UT/100ML-% IV SOLN
INTRAVENOUS | Status: DC
  Administered 2020-07-04: 10 [IU]/h via INTRAVENOUS
  Filled 2020-07-04: qty 100

## 2020-07-04 MED ORDER — LABETALOL HCL 5 MG/ML IV SOLN
20.0000 mg | Freq: Once | INTRAVENOUS | Status: AC
  Administered 2020-07-04: 20 mg via INTRAVENOUS
  Filled 2020-07-04: qty 4

## 2020-07-04 MED ORDER — ASPIRIN EC 81 MG PO TBEC
81.0000 mg | DELAYED_RELEASE_TABLET | Freq: Every day | ORAL | Status: DC
  Administered 2020-07-04 – 2020-07-05 (×2): 81 mg via ORAL
  Filled 2020-07-04 (×2): qty 1

## 2020-07-04 MED ORDER — HYDRALAZINE HCL 25 MG PO TABS
50.0000 mg | ORAL_TABLET | Freq: Once | ORAL | Status: AC
  Administered 2020-07-04: 50 mg via ORAL
  Filled 2020-07-04: qty 2

## 2020-07-04 MED ORDER — INSULIN REGULAR(HUMAN) IN NACL 100-0.9 UT/100ML-% IV SOLN: INTRAVENOUS | Status: DC

## 2020-07-04 MED ORDER — DEXTROSE 50 % IV SOLN: 0.0000 mL | INTRAVENOUS | Status: DC | PRN

## 2020-07-04 MED ORDER — HEPARIN SODIUM (PORCINE) 5000 UNIT/ML IJ SOLN
5000.0000 [IU] | Freq: Three times a day (TID) | INTRAMUSCULAR | Status: DC
  Administered 2020-07-04 – 2020-07-05 (×3): 5000 [IU] via SUBCUTANEOUS
  Filled 2020-07-04 (×3): qty 1
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
## 2020-07-04 NOTE — ED Triage Notes (Signed)
Pt BIB [facility] from home for diaphoretic and AMS, upon EMS arrival pt was eating PB. Pt took a dose of insulin approx 1.5 hrs ago. Dialysis M/W/Fr went yesterday, doesnt miss any appointments. Yesterday the physician informed him he would need to schedule an extra dialysis d/t excess fluid. Pt denies feeling SOB, BBS clear    BP 180/84 HR 100 95% RA 97.7 CBG HIGH

## 2020-07-04 NOTE — ED Notes (Signed)
[place] sandwich and diet gingerale provided. lantus given as ordered

## 2020-07-04 NOTE — ED Notes (Signed)
MD Francesco Sor made aware of CBG of 47. Amp given per endotool, will recheck in 15. Pt remains alert and oriented, satting well, no acute complaints. Diet order placed.
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
## 2020-07-05 DIAGNOSIS — I161 Hypertensive emergency: Secondary | ICD-10-CM

## 2020-07-05 DIAGNOSIS — E8770 Fluid overload, unspecified: Secondary | ICD-10-CM | POA: Diagnosis present

## 2020-07-05 LAB — CBC
HCT: 25.5 % — ABNORMAL LOW (ref 39.0–52.0)
Hemoglobin: 8 g/dL — ABNORMAL LOW (ref 13.0–17.0)
MCH: 24.4 pg — ABNORMAL LOW (ref 26.0–34.0)
MCHC: 31.4 g/dL (ref 30.0–36.0)
MCV: 77.7 fL — ABNORMAL LOW (ref 80.0–100.0)
Platelets: 371 10*3/uL (ref 150–400)
RBC: 3.28 MIL/uL — ABNORMAL LOW (ref 4.22–5.81)
RDW: 20 % — ABNORMAL HIGH (ref 11.5–15.5)
WBC: 5.6 10*3/uL (ref 4.0–10.5)
nRBC: 0.4 % — ABNORMAL HIGH (ref 0.0–0.2)

## 2020-07-05 LAB — CBG MONITORING, ED
Glucose-Capillary: 116 mg/dL — ABNORMAL HIGH (ref 70–99)
Glucose-Capillary: 103 mg/dL — ABNORMAL HIGH (ref 70–99)

## 2020-07-05 LAB — BASIC METABOLIC PANEL
Anion gap: 14 (ref 5–15)
BUN: 24 mg/dL — ABNORMAL HIGH (ref 6–20)
CO2: 25 mmol/L (ref 22–32)
Calcium: 8.3 mg/dL — ABNORMAL LOW (ref 8.9–10.3)
Chloride: 100 mmol/L (ref 98–111)
Creatinine, Ser: 5.14 mg/dL — ABNORMAL HIGH (ref 0.61–1.24)
GFR, Estimated: 12 mL/min — ABNORMAL LOW (ref >60)
Glucose, Bld: 104 mg/dL — ABNORMAL HIGH (ref 70–99)
Potassium: 3.2 mmol/L — ABNORMAL LOW (ref 3.5–5.1)
Sodium: 139 mmol/L (ref 135–145)

## 2020-07-05 MED ORDER — INSULIN DETEMIR 100 UNIT/ML ~~LOC~~ SOLN: 18.0000 [IU] | SUBCUTANEOUS | Status: DC

## 2020-07-05 MED ORDER — INSULIN ASPART 100 UNIT/ML ~~LOC~~ SOLN: 0.0000 [IU] | Freq: Three times a day (TID) | SUBCUTANEOUS | Status: DC

## 2020-07-05 MED ORDER — POTASSIUM CHLORIDE CRYS ER 20 MEQ PO TBCR
20.0000 meq | EXTENDED_RELEASE_TABLET | Freq: Once | ORAL | Status: AC
  Administered 2020-07-05: 20 meq via ORAL
  Filled 2020-07-05: qty 1

## 2020-07-05 MED ORDER — INSULIN ASPART 100 UNIT/ML ~~LOC~~ SOLN: 0.0000 [IU] | Freq: Every day | SUBCUTANEOUS | Status: DC
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
## 2020-07-05 NOTE — Progress Notes (Signed)
Nephrology Follow-Up Consult note   Assessment/Recommendations: Jeremiah [name] is a/an 55 y.o. male with a past medical history significant for ESRD, DM2, and HTN, admitted for volume overload.     Dialysis Orders:  [facility] MWF 4h 450/800 EDW 75kg 2K/2.25Ca AVF Heparin 4000 Hectorol 4 TIW  Mircera 225 q 2 weeks (last 1/3)   Assessment/Plan: 1. Hypertension/volume overload. 4L of UF removed and patient feels much better. BP remains high but likely related to excess volume and near what he normally runs.  Continue medications as ordered 2. ESRD -  Usual HD MWF.    Tolerated extra dialysis session.  We will perform dialysis again tomorrow if he remains inpatient if not he can receive dialysis outpatient 3. DMT2/she has just: Glucose significantly improved 4. Anemia  - Recent ESA as outpatient. Follow trends  today 5. Metabolic bone disease -  Ca ok. Continue binders/Hectorol when eating  6. Dispo: Could consider discharge home today if felt to be stable by hospitalist.  If not we will plan dialysis again tomorrow   Recommendations conveyed to primary service.    [facility] 07/05/2020 10:34 AM  ___________________________________________________________  CC: ESRD  Interval History/Subjective: Patient underwent dialysis last night with about 4 L of ultrafiltration removed.  Blood pressure remains high but patient is feeling much better today.  Hoping to go home.   Medications:  Current Facility-Administered Medications  Medication Dose Route Frequency Provider Last Rate Last Admin  . amLODipine (NORVASC) tablet 10 mg  10 mg Oral Daily Sueanne Margarita, DO   10 mg at 07/05/20 0956  . aspirin EC tablet 81 mg  81 mg Oral Daily Sueanne Margarita, DO   81 mg at 07/05/20 0956  . Chlorhexidine Gluconate Cloth 2 % PADS 6 each  6 each Topical Q0600 Lynnda Child, PA-C      . heparin injection 5,000 Units  5,000 Units Subcutaneous Q8H Sueanne Margarita, DO    5,000 Units at 07/05/20 0846  . hydrALAZINE (APRESOLINE) tablet 100 mg  100 mg Oral TID Sueanne Margarita, DO   100 mg at 07/05/20 0956  . labetalol (NORMODYNE) injection 10 mg  10 mg Intravenous Q4H PRN Sueanne Margarita, DO   10 mg at 07/05/20 0157  . potassium chloride SA (KLOR-CON) CR tablet 20 mEq  20 mEq Oral Once Reesa Chew, MD       Current Outpatient Medications  Medication Sig Dispense Refill  . amLODipine (NORVASC) 10 MG tablet TAKE 1 TABLET BY MOUTH ONCE DAILY (Patient taking differently: Take 10 mg by mouth daily.) 90 tablet 0  . B Complex-C-Folic Acid (DIALYVITE 202) 0.8 MG TABS Take 1 tablet by mouth daily.    . carvedilol (COREG) 12.5 MG tablet Take 1 tablet (12.5 mg total) by mouth 2 (two) times daily with a meal. 60 tablet 0  . hydrALAZINE (APRESOLINE) 100 MG tablet Take 100 mg by mouth 3 (three) times daily.    . insulin aspart (NOVOLOG FLEXPEN) 100 UNIT/ML FlexPen Inject 5 Units into the skin 3 (three) times daily with meals. (Patient taking differently: Inject 5 Units into the skin 3 (three) times daily with meals. Patient only takes 5 units TID with meals if BG is elevated >200s) 15 mL 0  . Insulin Glargine (BASAGLAR KWIKPEN) 100 UNIT/ML Inject 0.2 mLs (20 Units total) into the skin daily. (Patient taking differently: Inject 10-20 Units into the skin daily. 10 units daily (most days). Patient increases to 20 units daily if BG  are 300-400s.) 30 mL 0  . losartan (COZAAR) 100 MG tablet Take 100 mg by mouth daily.    . Alcohol Swabs (ALCOHOL PADS) 70 % PADS 1 each by Does not apply route 4 (four) times daily as needed. 1 each 3  . aspirin EC 81 MG tablet Take 1 tablet (81 mg total) by mouth daily. (Patient not taking: Reported on 07/04/2020) 90 tablet 2  . atorvastatin (LIPITOR) 40 MG tablet Take 1 tablet (40 mg total) by mouth daily. (Patient not taking: No sig reported) 90 tablet 3  . blood glucose meter kit and supplies KIT Dispense based on patient and insurance preference. Use  up to four times daily as directed. (FOR ICD-9 250.00, 250.01). 1 each 6  . cyclobenzaprine (FLEXERIL) 5 MG tablet Take 1 tablet (5 mg total) by mouth in the morning and at bedtime. (Patient not taking: No sig reported) 30 tablet 0  . Darbepoetin Alfa (ARANESP) 60 MCG/0.3ML SOSY injection Inject 0.3 mLs (60 mcg total) into the vein every Thursday with hemodialysis. (Patient not taking: No sig reported) 4.2 mL   . DULoxetine (CYMBALTA) 20 MG capsule Take 1 capsule (20 mg total) by mouth daily. (Patient not taking: No sig reported) 30 capsule 3  . gabapentin (NEURONTIN) 100 MG capsule Take 1 capsule (100 mg total) by mouth 3 (three) times daily. (Patient not taking: No sig reported) 90 capsule 3  . glucose blood (ACCU-CHEK AVIVA PLUS) test strip Use as instructed 100 each 12  . Insulin Pen Needle (PEN NEEDLES) 32G X 4 MM MISC 100 pens by Does not apply route 4 (four) times daily. Use to inject insulin four times daily 100 each 2  . Lancets (ACCU-CHEK MULTICLIX) lancets Use as instructed three times daily. 200 each 12      Review of Systems: 10 systems reviewed and negative except per interval history/subjective  Physical Exam: Vitals:   07/05/20 0816 07/05/20 0845  BP:  (!) 175/86  Pulse:  (!) 104  Resp:  16  Temp: 99.1 F (37.3 C)   SpO2:  97%   No intake/output data recorded.  Intake/Output Summary (Last 24 hours) at 07/05/2020 1034 Last data filed at 07/04/2020 1526 Gross per 24 hour  Intake 283.31 ml  Output --  Net 283.31 ml   Constitutional: No distress, sitting in bed ENMT: ears and nose without scars or lesions, MMM CV: normal rate, no edema Respiratory: Bilateral chest rise, no increased work of breathing Gastrointestinal: soft, non-tender, no palpable masses or hernias Skin: no visible lesions or rashes Psych: alert, judgement/insight appropriate, appropriate mood and affect   Test Results I personally reviewed new and old clinical labs and radiology tests Lab Results   Component Value Date   NA 139 07/05/2020   K 3.2 (L) 07/05/2020   CL 100 07/05/2020   CO2 25 07/05/2020   BUN 24 (H) 07/05/2020   CREATININE 5.14 (H) 07/05/2020   CALCIUM 8.3 (L) 07/05/2020   ALBUMIN 3.1 (L) 05/14/2020   PHOS 4.0 05/14/2020

## 2020-07-05 NOTE — ED Notes (Signed)
Pt returned to ED after Dialysis Treatment

## 2020-07-05 NOTE — Care Management CC44 (Signed)
Condition Code 44 Documentation Completed  Patient Details  Name: Jeremiah [name] MRN: [id number] Date of Birth: 11/12/1965   Condition Code 44 given:  Yes Patient signature on Condition Code 44 notice:  Yes Documentation of 2 MD's agreement:    Code 44 added to claim:       Oretha Milch, LCSW 07/05/2020, 11:44 AM

## 2020-07-09 NOTE — Anesthesia Preprocedure Evaluation (Addendum)
Anesthesia Evaluation  Patient identified by MRN, date of birth, ID band  Reviewed: Allergy & Precautions, NPO status , Patient's Chart, lab work & pertinent test results  Airway Mallampati: II  TM Distance: >3 FB Neck ROM: Full    Dental  (+) Teeth Intact   Pulmonary neg pulmonary ROS,    Pulmonary exam normal        Cardiovascular hypertension, Pt. on medications and Pt. on home beta blockers  Rhythm:Regular Rate:Normal     Neuro/Psych  Headaches, Depression    GI/Hepatic negative GI ROS, Neg liver ROS,   Endo/Other  diabetes, Poorly Controlled, Type 2, Insulin Dependent  Renal/GU Renal disease     Musculoskeletal  (+) Arthritis ,   Abdominal (+)   Bowel sounds: normal.  Peds  Hematology  (+) anemia ,   Anesthesia Other Findings   Reproductive/Obstetrics                           Anesthesia Physical Anesthesia Plan  ASA: III  Anesthesia Plan: MAC   Post-op Pain Management:    Induction: Intravenous  PONV Risk Score and Plan: 1 and Propofol infusion and Ondansetron  Airway Management Planned: Simple Face Mask, Natural Airway and Nasal Cannula  Additional Equipment: None  Intra-op Plan:   Post-operative Plan:   Informed Consent:   Plan Discussed with:   Anesthesia Plan Comments: (DOS FS 389, given 10 units regular insulin. Patient severely hypertensive 202/80, given 46m hydralazine IV Lab Results      Component                Value               Date                      WBC                      5.6                 07/05/2020                HGB                      8.0 (L)             07/05/2020                HCT                      25.5 (L)            07/05/2020                MCV                      77.7 (L)            07/05/2020                PLT                      371                 07/05/2020           Lab Results      Component  Value                Date                      NA                       139                 07/05/2020                K                        3.2 (L)             07/05/2020                CO2                      25                  07/05/2020                GLUCOSE                  104 (H)             07/05/2020                BUN                      24 (H)              07/05/2020                CREATININE               5.14 (H)            07/05/2020                CALCIUM                  8.3 (L)             07/05/2020                GFRNONAA                 12 (L)              07/05/2020                GFRAA                    4 (L)               12/25/2019          )       Anesthesia Quick Evaluation

## 2020-07-10 ENCOUNTER — Encounter (HOSPITAL_COMMUNITY): Payer: Self-pay | Admitting: Gastroenterology

## 2020-07-10 ENCOUNTER — Encounter (HOSPITAL_COMMUNITY)
Admission: RE | Disposition: A | Discharge status: Home / Self Care | Payer: Self-pay | Source: Home / Self Care | Attending: Gastroenterology

## 2020-07-10 ENCOUNTER — Ambulatory Visit (HOSPITAL_COMMUNITY): Payer: Medicare Other | Admitting: Anesthesiology

## 2020-07-10 ENCOUNTER — Ambulatory Visit (HOSPITAL_COMMUNITY)
Admission: RE | Admit: 2020-07-10 | Discharge: 2020-07-10 | Disposition: A | Discharge status: Home / Self Care | Payer: Medicare Other | Attending: Gastroenterology | Admitting: Gastroenterology

## 2020-07-10 ENCOUNTER — Other Ambulatory Visit: Payer: Self-pay

## 2020-07-10 DIAGNOSIS — D123 Benign neoplasm of transverse colon: Secondary | ICD-10-CM | POA: Insufficient documentation

## 2020-07-10 DIAGNOSIS — I1310 Hypertensive heart and chronic kidney disease without heart failure, with stage 1 through stage 4 chronic kidney disease, or unspecified chronic kidney disease: Secondary | ICD-10-CM | POA: Insufficient documentation

## 2020-07-10 DIAGNOSIS — Z8349 Family history of other endocrine, nutritional and metabolic diseases: Secondary | ICD-10-CM | POA: Insufficient documentation

## 2020-07-10 DIAGNOSIS — N189 Chronic kidney disease, unspecified: Secondary | ICD-10-CM | POA: Diagnosis not present

## 2020-07-10 DIAGNOSIS — Z841 Family history of disorders of kidney and ureter: Secondary | ICD-10-CM | POA: Diagnosis not present

## 2020-07-10 DIAGNOSIS — D509 Iron deficiency anemia, unspecified: Secondary | ICD-10-CM | POA: Insufficient documentation

## 2020-07-10 DIAGNOSIS — Z8249 Family history of ischemic heart disease and other diseases of the circulatory system: Secondary | ICD-10-CM | POA: Insufficient documentation

## 2020-07-10 DIAGNOSIS — Z833 Family history of diabetes mellitus: Secondary | ICD-10-CM | POA: Insufficient documentation

## 2020-07-10 DIAGNOSIS — K921 Melena: Secondary | ICD-10-CM | POA: Diagnosis not present

## 2020-07-10 DIAGNOSIS — K254 Chronic or unspecified gastric ulcer with hemorrhage: Secondary | ICD-10-CM | POA: Diagnosis not present

## 2020-07-10 DIAGNOSIS — K2970 Gastritis, unspecified, without bleeding: Secondary | ICD-10-CM | POA: Insufficient documentation

## 2020-07-10 DIAGNOSIS — Z992 Dependence on renal dialysis: Secondary | ICD-10-CM | POA: Diagnosis not present

## 2020-07-10 DIAGNOSIS — E1122 Type 2 diabetes mellitus with diabetic chronic kidney disease: Secondary | ICD-10-CM | POA: Insufficient documentation

## 2020-07-10 DIAGNOSIS — Z823 Family history of stroke: Secondary | ICD-10-CM | POA: Diagnosis not present

## 2020-07-10 DIAGNOSIS — E1140 Type 2 diabetes mellitus with diabetic neuropathy, unspecified: Secondary | ICD-10-CM | POA: Diagnosis not present

## 2020-07-10 HISTORY — PX: BIOPSY: SHX5522

## 2020-07-10 HISTORY — PX: COLONOSCOPY WITH PROPOFOL: SHX5780

## 2020-07-10 HISTORY — PX: POLYPECTOMY: SHX5525

## 2020-07-10 HISTORY — PX: ESOPHAGOGASTRODUODENOSCOPY (EGD) WITH PROPOFOL: SHX5813

## 2020-07-10 LAB — GLUCOSE, CAPILLARY
Glucose-Capillary: 389 mg/dL — ABNORMAL HIGH (ref 70–99)
Glucose-Capillary: 266 mg/dL — ABNORMAL HIGH (ref 70–99)
Glucose-Capillary: 173 mg/dL — ABNORMAL HIGH (ref 70–99)

## 2020-07-10 SURGERY — ESOPHAGOGASTRODUODENOSCOPY (EGD) WITH PROPOFOL
Anesthesia: Monitor Anesthesia Care

## 2020-07-10 MED ORDER — PROPOFOL 500 MG/50ML IV EMUL
INTRAVENOUS | Status: DC | PRN
  Administered 2020-07-10: 100 ug/kg/min via INTRAVENOUS

## 2020-07-10 MED ORDER — PHENYLEPHRINE 40 MCG/ML (10ML) SYRINGE FOR IV PUSH (FOR BLOOD PRESSURE SUPPORT)
PREFILLED_SYRINGE | INTRAVENOUS | Status: DC | PRN
  Administered 2020-07-10: 80 ug via INTRAVENOUS
  Administered 2020-07-10: 40 ug via INTRAVENOUS
  Administered 2020-07-10: 80 ug via INTRAVENOUS
  Administered 2020-07-10 (×2): 120 ug via INTRAVENOUS

## 2020-07-10 MED ORDER — PROPOFOL 10 MG/ML IV BOLUS
INTRAVENOUS | Status: DC | PRN
  Administered 2020-07-10 (×4): 20 mg via INTRAVENOUS

## 2020-07-10 MED ORDER — PROPOFOL 10 MG/ML IV BOLUS
INTRAVENOUS | Status: AC
  Filled 2020-07-10: qty 20

## 2020-07-10 MED ORDER — EPHEDRINE SULFATE-NACL 50-0.9 MG/10ML-% IV SOSY
PREFILLED_SYRINGE | INTRAVENOUS | Status: DC | PRN
  Administered 2020-07-10: 10 mg via INTRAVENOUS

## 2020-07-10 MED ORDER — PROPOFOL 500 MG/50ML IV EMUL
INTRAVENOUS | Status: AC
  Filled 2020-07-10: qty 50

## 2020-07-10 MED ORDER — SODIUM CHLORIDE 0.9 % IV SOLN
INTRAVENOUS | Status: DC
  Administered 2020-07-10: 500 mL via INTRAVENOUS

## 2020-07-10 MED ORDER — INSULIN ASPART 100 UNIT/ML ~~LOC~~ SOLN
SUBCUTANEOUS | Status: AC
  Filled 2020-07-10: qty 1

## 2020-07-10 MED ORDER — LIDOCAINE 2% (20 MG/ML) 5 ML SYRINGE
INTRAMUSCULAR | Status: DC | PRN
  Administered 2020-07-10: 80 mg via INTRAVENOUS

## 2020-07-10 MED ORDER — HYDRALAZINE HCL 20 MG/ML IJ SOLN
INTRAMUSCULAR | Status: DC | PRN
  Administered 2020-07-10: 10 mg via INTRAVENOUS

## 2020-07-10 MED ORDER — INSULIN ASPART 100 UNIT/ML ~~LOC~~ SOLN
10.0000 [IU] | Freq: Once | SUBCUTANEOUS | Status: AC
  Administered 2020-07-10: 10 [IU] via SUBCUTANEOUS

## 2020-07-10 SURGICAL SUPPLY — 25 items

## 2020-07-10 NOTE — Op Note (Signed)
[facility] Patient Name: Jeremiah [name] Procedure Date: 07/10/2020 MRN: [id number] Attending MD: Carol Ada , MD Date of Birth: March 09, 1966 CSN: [id number] Age: 55 Admit Type: Outpatient Procedure:                Colonoscopy Indications:              Melena, Iron deficiency anemia Providers:                Carol Ada, MD, Cleda Daub, RN, Tyrone Apple, Technician Referring MD:              Medicines:                Propofol per Anesthesia Complications:            No immediate complications. Estimated Blood Loss:     Estimated blood loss: none. Procedure:                Pre-Anesthesia Assessment:                           - Prior to the procedure, a History and Physical                            was performed, and patient medications and                            allergies were reviewed. The patient's tolerance of                            previous anesthesia was also reviewed. The risks                            and benefits of the procedure and the sedation                            options and risks were discussed with the patient.                            All questions were answered, and informed consent                            was obtained. Prior Anticoagulants: The patient has                            taken no previous anticoagulant or antiplatelet                            agents. ASA Grade Assessment: III - A patient with                            severe systemic disease. After reviewing the risks                            and  benefits, the patient was deemed in                            satisfactory condition to undergo the procedure.                           - Sedation was administered by an anesthesia                            professional. Deep sedation was attained.                           After obtaining informed consent, the colonoscope                            was passed under direct vision.  Throughout the                            procedure, the patient's blood pressure, pulse, and                            oxygen saturations were monitored continuously. The                            PCF-H190DL ([id number]) Olympus pediatric colonscope                            was introduced through the anus and advanced to the                            the cecum, identified by appendiceal orifice and                            ileocecal valve. The colonoscopy was performed                            without difficulty. The patient tolerated the                            procedure well. The quality of the bowel                            preparation was good. The ileocecal valve,                            appendiceal orifice, and rectum were photographed. Scope In: 7:51:35 AM Scope Out: 8:05:49 AM Scope Withdrawal Time: 0 hours 10 minutes 16 seconds  Total Procedure Duration: 0 hours 14 minutes 14 seconds  Findings:      A 3 mm polyp was found in the hepatic flexure. The polyp was sessile.       The polyp was removed with a cold snare. Resection and retrieval were       complete. Impression:               - One 3 mm polyp at  the hepatic flexure, removed                            with a cold snare. Resected and retrieved. Moderate Sedation:      Not Applicable - Patient had care per Anesthesia. Recommendation:           - Patient has a contact number available for                            emergencies. The signs and symptoms of potential                            delayed complications were discussed with the                            patient. Return to normal activities tomorrow.                            Written discharge instructions were provided to the                            patient.                           - Return to normal activities tomorrow.                           - Resume previous diet.                           - Continue present medications.                            - Await pathology results.                           - Repeat colonoscopy in 7 years for surveillance. Procedure Code(s):        --- Professional ---                           [phone], Colonoscopy, flexible; with removal of                            tumor(s), polyp(s), or other lesion(s) by snare                            technique Diagnosis Code(s):        --- Professional ---                           K63.5, Polyp of colon                           K92.1, Melena (includes Hematochezia)                           D50.9, Iron deficiency anemia, unspecified  CPT copyright 2019 [employer]. All rights reserved. The codes documented in this report are preliminary and upon coder review may  be revised to meet current compliance requirements. Carol Ada, MD Carol Ada, MD 07/10/2020 8:20:04 AM This report has been signed electronically. Number of Addenda: 0

## 2020-07-10 NOTE — Anesthesia Postprocedure Evaluation (Signed)
Anesthesia Post Note  Patient: Jeremiah [name]  Procedure(s) Performed: ESOPHAGOGASTRODUODENOSCOPY (EGD) WITH PROPOFOL (N/A ) COLONOSCOPY WITH PROPOFOL (N/A ) BIOPSY POLYPECTOMY     Patient location during evaluation: Endoscopy Anesthesia Type: MAC Level of consciousness: awake and alert Pain management: pain level controlled Vital Signs Assessment: post-procedure vital signs reviewed and stable Respiratory status: spontaneous breathing, nonlabored ventilation, respiratory function stable and patient connected to nasal cannula oxygen Cardiovascular status: stable and blood pressure returned to baseline Postop Assessment: no apparent nausea or vomiting Anesthetic complications: no   No complications documented.  Last Vitals:  Vitals:   07/10/20 0850 07/10/20 0900  BP: (!) 201/83 (!) 201/90  Pulse: 78 80  Resp: 15 20  Temp:    SpO2: 98% 97%    Last Pain:  Vitals:   07/10/20 0900  TempSrc:   PainSc: 0-No pain                 Belenda Cruise P Delontae Lamm

## 2020-07-10 NOTE — Op Note (Signed)
[facility] Patient Name: Jeremiah [name] Procedure Date: 07/10/2020 MRN: [id number] Attending MD: Carol Ada , MD Date of Birth: September 26, 1965 CSN: [id number] Age: 55 Admit Type: Outpatient Procedure:                Upper GI endoscopy Indications:              Iron deficiency anemia, Melena Providers:                Carol Ada, MD, Cleda Daub, RN, Tyrone Apple, Technician Referring MD:              Medicines:                Propofol per Anesthesia Complications:            No immediate complications. Estimated Blood Loss:     Estimated blood loss was minimal. Procedure:                Pre-Anesthesia Assessment:                           - Sedation was administered by an anesthesia                            professional. Deep sedation was attained.                           After obtaining informed consent, the endoscope was                            passed under direct vision. Throughout the                            procedure, the patient's blood pressure, pulse, and                            oxygen saturations were monitored continuously. The                            PCF-H190DL ([id number]) Olympus pediatric colonscope                            was introduced through the mouth, and advanced to                            the second part of duodenum. The upper GI endoscopy                            was accomplished without difficulty. The patient                            tolerated the procedure well. Scope In: Scope Out: Findings:      The esophagus was normal.      Multiple dispersed small erosions with stigmata of recent bleeding were       found in  the gastric fundus. Biopsies were taken with a cold forceps for       Helicobacter pylori testing.      The examined duodenum was normal.      Some erosions associated with hematin and surrouding gastritis was       identified. This may be the source of his recent melenic  stools and his       IDA. Impression:               - Normal esophagus.                           - Erosive gastropathy with stigmata of recent                            bleeding. Biopsied.                           - Normal examined duodenum. Moderate Sedation:      Not Applicable - Patient had care per Anesthesia. Recommendation:           - Patient has a contact number available for                            emergencies. The signs and symptoms of potential                            delayed complications were discussed with the                            patient. Return to normal activities tomorrow.                            Written discharge instructions were provided to the                            patient.                           - Resume previous diet.                           - Continue present medications.                           - Await pathology results.                           - Return to GI clinic in 6 weeks. Procedure Code(s):        --- Professional ---                           [id number], Esophagogastroduodenoscopy, flexible,                            transoral; with biopsy, single or multiple Diagnosis Code(s):        --- Professional ---  K92.2, Gastrointestinal hemorrhage, unspecified                           D50.9, Iron deficiency anemia, unspecified                           K92.1, Melena (includes Hematochezia) CPT copyright 2019 American [employer] Association. All rights reserved. The codes documented in this report are preliminary and upon coder review may  be revised to meet current compliance requirements. Carol Ada, MD Carol Ada, MD 07/10/2020 8:16:17 AM This report has been signed electronically. Number of Addenda: 0

## 2020-07-10 NOTE — Discharge Instructions (Signed)

## 2020-07-10 NOTE — Transfer of Care (Signed)
Immediate Anesthesia Transfer of Care Note  Patient: Jeremiah [name]  Procedure(s) Performed: ESOPHAGOGASTRODUODENOSCOPY (EGD) WITH PROPOFOL (N/A ) COLONOSCOPY WITH PROPOFOL (N/A ) BIOPSY POLYPECTOMY  Patient Location: Endoscopy Unit  Anesthesia Type:MAC  Level of Consciousness: drowsy and patient cooperative  Airway & Oxygen Therapy: Patient Spontanous Breathing and Patient connected to face mask oxygen  Post-op Assessment: Report given to RN and Post -op Vital signs reviewed and stable  Post vital signs: Reviewed and stable  Last Vitals:  Vitals Value Taken Time  BP    Temp    Pulse    Resp 14 07/10/20 0817  SpO2    Vitals shown include unvalidated device data.  Last Pain:  Vitals:   07/10/20 0717  TempSrc: Oral  PainSc: 0-No pain         Complications: No complications documented.
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
## 2020-07-13 ENCOUNTER — Other Ambulatory Visit: Payer: Self-pay

## 2020-07-14 ENCOUNTER — Encounter (HOSPITAL_COMMUNITY): Payer: Self-pay | Admitting: Gastroenterology

## 2020-07-14 LAB — SURGICAL PATHOLOGY

## 2020-07-20 ENCOUNTER — Inpatient Hospital Stay (HOSPITAL_COMMUNITY)
Admission: EM | Admit: 2020-07-20 | Discharge: 2020-07-25 | DRG: 377 | Disposition: A | Discharge status: Home / Self Care | Payer: Medicare Other | Attending: Family Medicine | Admitting: Family Medicine

## 2020-07-20 ENCOUNTER — Inpatient Hospital Stay (HOSPITAL_COMMUNITY): Payer: Medicare Other

## 2020-07-20 ENCOUNTER — Emergency Department (HOSPITAL_COMMUNITY): Payer: Medicare Other

## 2020-07-20 ENCOUNTER — Other Ambulatory Visit: Payer: Self-pay

## 2020-07-20 ENCOUNTER — Encounter (HOSPITAL_COMMUNITY): Payer: Self-pay | Admitting: Emergency Medicine

## 2020-07-20 DIAGNOSIS — Z79899 Other long term (current) drug therapy: Secondary | ICD-10-CM

## 2020-07-20 DIAGNOSIS — K269 Duodenal ulcer, unspecified as acute or chronic, without hemorrhage or perforation: Secondary | ICD-10-CM | POA: Diagnosis present

## 2020-07-20 DIAGNOSIS — J9601 Acute respiratory failure with hypoxia: Secondary | ICD-10-CM | POA: Diagnosis not present

## 2020-07-20 DIAGNOSIS — E11649 Type 2 diabetes mellitus with hypoglycemia without coma: Secondary | ICD-10-CM | POA: Diagnosis not present

## 2020-07-20 DIAGNOSIS — R9431 Abnormal electrocardiogram [ECG] [EKG]: Secondary | ICD-10-CM | POA: Diagnosis present

## 2020-07-20 DIAGNOSIS — E872 Acidosis: Secondary | ICD-10-CM | POA: Diagnosis not present

## 2020-07-20 DIAGNOSIS — Z7982 Long term (current) use of aspirin: Secondary | ICD-10-CM

## 2020-07-20 DIAGNOSIS — D649 Anemia, unspecified: Secondary | ICD-10-CM

## 2020-07-20 DIAGNOSIS — E8889 Other specified metabolic disorders: Secondary | ICD-10-CM | POA: Diagnosis present

## 2020-07-20 DIAGNOSIS — I469 Cardiac arrest, cause unspecified: Secondary | ICD-10-CM | POA: Diagnosis not present

## 2020-07-20 DIAGNOSIS — E1142 Type 2 diabetes mellitus with diabetic polyneuropathy: Secondary | ICD-10-CM | POA: Diagnosis present

## 2020-07-20 DIAGNOSIS — E113319 Type 2 diabetes mellitus with moderate nonproliferative diabetic retinopathy with macular edema, unspecified eye: Secondary | ICD-10-CM | POA: Diagnosis present

## 2020-07-20 DIAGNOSIS — N186 End stage renal disease: Secondary | ICD-10-CM | POA: Diagnosis not present

## 2020-07-20 DIAGNOSIS — Z823 Family history of stroke: Secondary | ICD-10-CM

## 2020-07-20 DIAGNOSIS — Z833 Family history of diabetes mellitus: Secondary | ICD-10-CM | POA: Diagnosis not present

## 2020-07-20 DIAGNOSIS — N2581 Secondary hyperparathyroidism of renal origin: Secondary | ICD-10-CM | POA: Diagnosis present

## 2020-07-20 DIAGNOSIS — K2970 Gastritis, unspecified, without bleeding: Secondary | ICD-10-CM | POA: Diagnosis present

## 2020-07-20 DIAGNOSIS — I132 Hypertensive heart and chronic kidney disease with heart failure and with stage 5 chronic kidney disease, or end stage renal disease: Secondary | ICD-10-CM | POA: Diagnosis present

## 2020-07-20 DIAGNOSIS — M1990 Unspecified osteoarthritis, unspecified site: Secondary | ICD-10-CM | POA: Diagnosis present

## 2020-07-20 DIAGNOSIS — I468 Cardiac arrest due to other underlying condition: Secondary | ICD-10-CM | POA: Diagnosis present

## 2020-07-20 DIAGNOSIS — Z83438 Family history of other disorder of lipoprotein metabolism and other lipidemia: Secondary | ICD-10-CM

## 2020-07-20 DIAGNOSIS — D62 Acute posthemorrhagic anemia: Secondary | ICD-10-CM | POA: Diagnosis present

## 2020-07-20 DIAGNOSIS — E1122 Type 2 diabetes mellitus with diabetic chronic kidney disease: Secondary | ICD-10-CM | POA: Diagnosis present

## 2020-07-20 DIAGNOSIS — E1140 Type 2 diabetes mellitus with diabetic neuropathy, unspecified: Secondary | ICD-10-CM | POA: Diagnosis present

## 2020-07-20 DIAGNOSIS — G931 Anoxic brain damage, not elsewhere classified: Secondary | ICD-10-CM | POA: Diagnosis not present

## 2020-07-20 DIAGNOSIS — G8929 Other chronic pain: Secondary | ICD-10-CM | POA: Diagnosis present

## 2020-07-20 DIAGNOSIS — Z9119 Patient's noncompliance with other medical treatment and regimen: Secondary | ICD-10-CM

## 2020-07-20 DIAGNOSIS — Z992 Dependence on renal dialysis: Secondary | ICD-10-CM | POA: Diagnosis not present

## 2020-07-20 DIAGNOSIS — E1165 Type 2 diabetes mellitus with hyperglycemia: Secondary | ICD-10-CM | POA: Diagnosis present

## 2020-07-20 DIAGNOSIS — Z841 Family history of disorders of kidney and ureter: Secondary | ICD-10-CM

## 2020-07-20 DIAGNOSIS — G9341 Metabolic encephalopathy: Secondary | ICD-10-CM | POA: Diagnosis present

## 2020-07-20 DIAGNOSIS — I5022 Chronic systolic (congestive) heart failure: Secondary | ICD-10-CM | POA: Diagnosis present

## 2020-07-20 DIAGNOSIS — Z8616 Personal history of COVID-19: Secondary | ICD-10-CM

## 2020-07-20 DIAGNOSIS — Z20822 Contact with and (suspected) exposure to covid-19: Secondary | ICD-10-CM | POA: Diagnosis present

## 2020-07-20 DIAGNOSIS — K922 Gastrointestinal hemorrhage, unspecified: Secondary | ICD-10-CM | POA: Diagnosis present

## 2020-07-20 DIAGNOSIS — Z794 Long term (current) use of insulin: Secondary | ICD-10-CM

## 2020-07-20 DIAGNOSIS — D631 Anemia in chronic kidney disease: Secondary | ICD-10-CM | POA: Diagnosis present

## 2020-07-20 DIAGNOSIS — I5020 Unspecified systolic (congestive) heart failure: Secondary | ICD-10-CM

## 2020-07-20 DIAGNOSIS — E785 Hyperlipidemia, unspecified: Secondary | ICD-10-CM | POA: Diagnosis present

## 2020-07-20 DIAGNOSIS — Z8719 Personal history of other diseases of the digestive system: Secondary | ICD-10-CM

## 2020-07-20 DIAGNOSIS — K921 Melena: Principal | ICD-10-CM | POA: Diagnosis present

## 2020-07-20 DIAGNOSIS — Z8249 Family history of ischemic heart disease and other diseases of the circulatory system: Secondary | ICD-10-CM

## 2020-07-20 DIAGNOSIS — I10 Essential (primary) hypertension: Secondary | ICD-10-CM | POA: Diagnosis present

## 2020-07-20 DIAGNOSIS — J9600 Acute respiratory failure, unspecified whether with hypoxia or hypercapnia: Secondary | ICD-10-CM

## 2020-07-20 DIAGNOSIS — R001 Bradycardia, unspecified: Secondary | ICD-10-CM | POA: Diagnosis present

## 2020-07-20 DIAGNOSIS — Z86010 Personal history of colonic polyps: Secondary | ICD-10-CM

## 2020-07-20 LAB — CBC
HCT: 16.6 % — ABNORMAL LOW (ref 39.0–52.0)
HCT: 19.9 % — ABNORMAL LOW (ref 39.0–52.0)
Hemoglobin: 5.2 g/dL — CL (ref 13.0–17.0)
Hemoglobin: 6.1 g/dL — CL (ref 13.0–17.0)
MCH: 25.1 pg — ABNORMAL LOW (ref 26.0–34.0)
MCH: 25 pg — ABNORMAL LOW (ref 26.0–34.0)
MCHC: 31.3 g/dL (ref 30.0–36.0)
MCHC: 30.7 g/dL (ref 30.0–36.0)
MCV: 80.2 fL (ref 80.0–100.0)
MCV: 81.6 fL (ref 80.0–100.0)
Platelets: 278 10*3/uL (ref 150–400)
Platelets: 307 10*3/uL (ref 150–400)
RBC: 2.07 MIL/uL — ABNORMAL LOW (ref 4.22–5.81)
RBC: 2.44 MIL/uL — ABNORMAL LOW (ref 4.22–5.81)
RDW: 21.1 % — ABNORMAL HIGH (ref 11.5–15.5)
RDW: 21.1 % — ABNORMAL HIGH (ref 11.5–15.5)
WBC: 5.7 10*3/uL (ref 4.0–10.5)
WBC: 6.4 10*3/uL (ref 4.0–10.5)
nRBC: 1.9 % — ABNORMAL HIGH (ref 0.0–0.2)
nRBC: 0.8 % — ABNORMAL HIGH (ref 0.0–0.2)

## 2020-07-20 LAB — SARS CORONAVIRUS 2 BY RT PCR (HOSPITAL ORDER, PERFORMED IN ~~LOC~~ HOSPITAL LAB): SARS Coronavirus 2: NEGATIVE

## 2020-07-20 LAB — COMPREHENSIVE METABOLIC PANEL
ALT: 36 U/L (ref 0–44)
AST: 46 U/L — ABNORMAL HIGH (ref 15–41)
Albumin: 3 g/dL — ABNORMAL LOW (ref 3.5–5.0)
Alkaline Phosphatase: 70 U/L (ref 38–126)
Anion gap: 17 — ABNORMAL HIGH (ref 5–15)
BUN: 92 mg/dL — ABNORMAL HIGH (ref 6–20)
CO2: 24 mmol/L (ref 22–32)
Calcium: 8.1 mg/dL — ABNORMAL LOW (ref 8.9–10.3)
Chloride: 93 mmol/L — ABNORMAL LOW (ref 98–111)
Creatinine, Ser: 11.27 mg/dL — ABNORMAL HIGH (ref 0.61–1.24)
GFR, Estimated: 5 mL/min — ABNORMAL LOW (ref >60)
Glucose, Bld: 342 mg/dL — ABNORMAL HIGH (ref 70–99)
Potassium: 4.3 mmol/L (ref 3.5–5.1)
Sodium: 134 mmol/L — ABNORMAL LOW (ref 135–145)
Total Bilirubin: 0.5 mg/dL (ref 0.3–1.2)
Total Protein: 5.8 g/dL — ABNORMAL LOW (ref 6.5–8.1)

## 2020-07-20 LAB — HEMOGLOBIN AND HEMATOCRIT, BLOOD
HCT: 18 % — ABNORMAL LOW (ref 39.0–52.0)
HCT: 21.4 % — ABNORMAL LOW (ref 39.0–52.0)
Hemoglobin: 5.4 g/dL — CL (ref 13.0–17.0)
Hemoglobin: 7.1 g/dL — ABNORMAL LOW (ref 13.0–17.0)

## 2020-07-20 LAB — I-STAT ARTERIAL BLOOD GAS, ED
Acid-Base Excess: 0 mmol/L (ref 0.0–2.0)
Bicarbonate: 23.3 mmol/L (ref 20.0–28.0)
Calcium, Ion: 1.01 mmol/L — ABNORMAL LOW (ref 1.15–1.40)
HCT: 24 % — ABNORMAL LOW (ref 39.0–52.0)
Hemoglobin: 8.2 g/dL — ABNORMAL LOW (ref 13.0–17.0)
O2 Saturation: 98 %
Patient temperature: 97.6
Potassium: 5.1 mmol/L (ref 3.5–5.1)
Sodium: 132 mmol/L — ABNORMAL LOW (ref 135–145)
TCO2: 24 mmol/L (ref 22–32)
pCO2 arterial: 31.7 mmHg — ABNORMAL LOW (ref 32.0–48.0)
pH, Arterial: 7.473 — ABNORMAL HIGH (ref 7.350–7.450)
pO2, Arterial: 103 mmHg (ref 83.0–108.0)

## 2020-07-20 LAB — PREPARE RBC (CROSSMATCH)

## 2020-07-20 LAB — ABO/RH: ABO/RH(D): O POS

## 2020-07-20 LAB — CBG MONITORING, ED
Glucose-Capillary: 328 mg/dL — ABNORMAL HIGH (ref 70–99)
Glucose-Capillary: 382 mg/dL — ABNORMAL HIGH (ref 70–99)

## 2020-07-20 LAB — PROTIME-INR
INR: 1.2 (ref 0.8–1.2)
Prothrombin Time: 14.2 seconds (ref 11.4–15.2)

## 2020-07-20 MED ORDER — CAMPHOR-MENTHOL 0.5-0.5 % EX LOTN
1.0000 "application " | TOPICAL_LOTION | Freq: Three times a day (TID) | CUTANEOUS | Status: DC | PRN
  Filled 2020-07-20: qty 222

## 2020-07-20 MED ORDER — POLYETHYLENE GLYCOL 3350 17 G PO PACK: 17.0000 g | PACK | Freq: Every day | ORAL | Status: DC | PRN

## 2020-07-20 MED ORDER — NEPRO/CARBSTEADY PO LIQD
237.0000 mL | Freq: Three times a day (TID) | ORAL | Status: DC | PRN
  Filled 2020-07-20: qty 237

## 2020-07-20 MED ORDER — PROPOFOL 1000 MG/100ML IV EMUL
INTRAVENOUS | Status: AC
  Filled 2020-07-20: qty 100

## 2020-07-20 MED ORDER — INSULIN ASPART 100 UNIT/ML ~~LOC~~ SOLN: 0.0000 [IU] | Freq: Three times a day (TID) | SUBCUTANEOUS | Status: DC

## 2020-07-20 MED ORDER — ACETAMINOPHEN 325 MG PO TABS: 650.0000 mg | ORAL_TABLET | ORAL | Status: DC | PRN

## 2020-07-20 MED ORDER — ZOLPIDEM TARTRATE 5 MG PO TABS
5.0000 mg | ORAL_TABLET | Freq: Every evening | ORAL | Status: DC | PRN
  Administered 2020-07-25: 5 mg via ORAL
  Filled 2020-07-20: qty 1

## 2020-07-20 MED ORDER — FENTANYL BOLUS VIA INFUSION
50.0000 ug | INTRAVENOUS | Status: DC | PRN
  Filled 2020-07-20: qty 50

## 2020-07-20 MED ORDER — CHLORHEXIDINE GLUCONATE CLOTH 2 % EX PADS
6.0000 | MEDICATED_PAD | Freq: Every day | CUTANEOUS | Status: DC
  Administered 2020-07-21: 6 via TOPICAL

## 2020-07-20 MED ORDER — SORBITOL 70 % SOLN
30.0000 mL | Status: DC | PRN
  Filled 2020-07-20: qty 30

## 2020-07-20 MED ORDER — ACETAMINOPHEN 650 MG RE SUPP: 650.0000 mg | Freq: Four times a day (QID) | RECTAL | Status: DC | PRN

## 2020-07-20 MED ORDER — HYDROXYZINE HCL 25 MG PO TABS: 25.0000 mg | ORAL_TABLET | Freq: Three times a day (TID) | ORAL | Status: DC | PRN

## 2020-07-20 MED ORDER — DOCUSATE SODIUM 283 MG RE ENEM
1.0000 | ENEMA | RECTAL | Status: DC | PRN
  Filled 2020-07-20: qty 1

## 2020-07-20 MED ORDER — HYDRALAZINE HCL 20 MG/ML IJ SOLN: 5.0000 mg | INTRAMUSCULAR | Status: DC | PRN

## 2020-07-20 MED ORDER — AMLODIPINE BESYLATE 10 MG PO TABS
10.0000 mg | ORAL_TABLET | Freq: Every day | ORAL | Status: DC
  Administered 2020-07-21 – 2020-07-23 (×3): 10 mg via ORAL
  Filled 2020-07-20 (×3): qty 1

## 2020-07-20 MED ORDER — IPRATROPIUM-ALBUTEROL 0.5-2.5 (3) MG/3ML IN SOLN: 3.0000 mL | Freq: Four times a day (QID) | RESPIRATORY_TRACT | Status: DC | PRN

## 2020-07-20 MED ORDER — ACETAMINOPHEN 325 MG PO TABS: 650.0000 mg | ORAL_TABLET | Freq: Four times a day (QID) | ORAL | Status: DC | PRN

## 2020-07-20 MED ORDER — SODIUM CHLORIDE 0.9 % IV SOLN
8.0000 mg/h | INTRAVENOUS | Status: DC
  Administered 2020-07-20 – 2020-07-22 (×6): 8 mg/h via INTRAVENOUS
  Filled 2020-07-20 (×7): qty 80

## 2020-07-20 MED ORDER — FENTANYL CITRATE (PF) 100 MCG/2ML IJ SOLN
50.0000 ug | Freq: Once | INTRAMUSCULAR | Status: AC
  Administered 2020-07-20: 50 ug via INTRAVENOUS

## 2020-07-20 MED ORDER — CARVEDILOL 25 MG PO TABS
25.0000 mg | ORAL_TABLET | Freq: Every day | ORAL | Status: DC
  Administered 2020-07-21 – 2020-07-25 (×4): 25 mg via ORAL
  Filled 2020-07-20 (×4): qty 1

## 2020-07-20 MED ORDER — PROPOFOL 1000 MG/100ML IV EMUL
5.0000 ug/kg/min | INTRAVENOUS | Status: DC
  Administered 2020-07-20: 80 ug/kg/min via INTRAVENOUS
  Administered 2020-07-20: 5 ug/kg/min via INTRAVENOUS
  Administered 2020-07-21: 45 ug/kg/min via INTRAVENOUS
  Administered 2020-07-21: 40 ug/kg/min via INTRAVENOUS
  Filled 2020-07-20 (×4): qty 100

## 2020-07-20 MED ORDER — SODIUM CHLORIDE 0.9 % IV SOLN
10.0000 mL/h | Freq: Once | INTRAVENOUS | Status: AC
  Administered 2020-07-20: 10 mL/h via INTRAVENOUS

## 2020-07-20 MED ORDER — CALCIUM CARBONATE ANTACID 1250 MG/5ML PO SUSP
500.0000 mg | Freq: Four times a day (QID) | ORAL | Status: DC | PRN
  Administered 2020-07-20: 500 mg via ORAL
  Filled 2020-07-20: qty 5

## 2020-07-20 MED ORDER — LOSARTAN POTASSIUM 50 MG PO TABS: 100.0000 mg | ORAL_TABLET | Freq: Every day | ORAL | Status: DC

## 2020-07-20 MED ORDER — DOCUSATE SODIUM 50 MG/5ML PO LIQD
100.0000 mg | Freq: Two times a day (BID) | ORAL | Status: DC | PRN
  Filled 2020-07-20: qty 10

## 2020-07-20 MED ORDER — FERRIC CITRATE 1 GM 210 MG(FE) PO TABS
210.0000 mg | ORAL_TABLET | Freq: Three times a day (TID) | ORAL | Status: DC
  Administered 2020-07-20 – 2020-07-25 (×10): 210 mg via ORAL
  Filled 2020-07-20 (×16): qty 1

## 2020-07-20 MED ORDER — ONDANSETRON HCL 4 MG PO TABS: 4.0000 mg | ORAL_TABLET | Freq: Four times a day (QID) | ORAL | Status: DC | PRN

## 2020-07-20 MED ORDER — RENA-VITE PO TABS
1.0000 | ORAL_TABLET | Freq: Three times a day (TID) | ORAL | Status: DC
  Administered 2020-07-21 – 2020-07-25 (×9): 1 via ORAL
  Filled 2020-07-20 (×12): qty 1

## 2020-07-20 MED ORDER — SODIUM CHLORIDE 0.9 % IV SOLN
80.0000 mg | Freq: Once | INTRAVENOUS | Status: AC
  Administered 2020-07-20: 80 mg via INTRAVENOUS
  Filled 2020-07-20: qty 80

## 2020-07-20 MED ORDER — ONDANSETRON HCL 4 MG/2ML IJ SOLN
4.0000 mg | Freq: Four times a day (QID) | INTRAMUSCULAR | Status: DC | PRN
  Filled 2020-07-20: qty 2

## 2020-07-20 MED ORDER — FENTANYL 2500MCG IN NS 250ML (10MCG/ML) PREMIX INFUSION
25.0000 ug/h | INTRAVENOUS | Status: DC
  Administered 2020-07-20: 50 ug/h via INTRAVENOUS
  Filled 2020-07-20 (×2): qty 250

## 2020-07-20 MED ORDER — DOCUSATE SODIUM 100 MG PO CAPS: 100.0000 mg | ORAL_CAPSULE | Freq: Two times a day (BID) | ORAL | Status: DC | PRN

## 2020-07-20 MED ORDER — INSULIN ASPART 100 UNIT/ML ~~LOC~~ SOLN
0.0000 [IU] | Freq: Every day | SUBCUTANEOUS | Status: DC
  Administered 2020-07-20: 5 [IU] via SUBCUTANEOUS

## 2020-07-20 MED ORDER — HYDRALAZINE HCL 50 MG PO TABS
100.0000 mg | ORAL_TABLET | Freq: Three times a day (TID) | ORAL | Status: DC
  Administered 2020-07-21 – 2020-07-25 (×13): 100 mg via ORAL
  Filled 2020-07-20 (×16): qty 2

## 2020-07-20 NOTE — Code Documentation (Signed)
Placed pt p on zoll.

## 2020-07-20 NOTE — Code Documentation (Signed)
1st unit of O positive started at this time.

## 2020-07-20 NOTE — Code Documentation (Signed)
Critical care at bedside  

## 2020-07-20 NOTE — ED Notes (Signed)
RT at bedside for ABG

## 2020-07-20 NOTE — Code Documentation (Signed)
CBG 328 
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
## 2020-07-20 NOTE — Code Documentation (Signed)
Secretions suctioned from oral airway.

## 2020-07-20 NOTE — Code Documentation (Signed)
Etomidate 10 given.

## 2020-07-20 NOTE — ED Notes (Signed)
2nd unit of blood done  

## 2020-07-20 NOTE — Code Documentation (Signed)
Per pharmacy - fentanyl and PPI not compatible. Pt only has 2 IVs at this time.

## 2020-07-20 NOTE — ED Notes (Signed)
Per Dr. Duwayne Heck - pt does not need to have a foley cath at this time.

## 2020-07-20 NOTE — Code Documentation (Signed)
2nd unit of blood transfusing at this time.
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
## 2020-07-20 NOTE — Code Documentation (Signed)
1mg  epinephrine given IV at this time.

## 2020-07-20 NOTE — Code Documentation (Addendum)
Pt states noticed to vomit, this RN and Brandi at bedside pt states "check my sugar". Went unresponsive. Code blue called. Providers at bedside - Dr. Reubin Milan and PA Evette Cristal, pharmacy, several RNs.
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
## 2020-07-20 NOTE — Code Documentation (Signed)
Pulse check pt with agonal respirations.

## 2020-07-20 NOTE — Code Documentation (Signed)
2units to be given to pt per Dr. Roslynn Amble.

## 2020-07-20 NOTE — ED Triage Notes (Signed)
Pt here from home with c/o dark tarry stools over the last few days , pt  Had  A colonoscopy  2 weeks ago but does not know the results

## 2020-07-20 NOTE — ED Notes (Signed)
Called for vitals by nightshift and twice at this time by dayshift for vitals.

## 2020-07-20 NOTE — Code Documentation (Addendum)
CPR started. Pt placed on bag valve mask ventilation.

## 2020-07-20 NOTE — ED Provider Notes (Addendum)
Update note  55 year old male admitted for ongoing melena, anemia, on PPI, EGD for tomorrow.  CODE BLUE called by RN, patient when suddenly unresponsive, not breathing. When I arrived in room, faint pulse, apneic receiving rescue breathing via amb bag then deteriorated to asystole and chest compressions were initiated. Received 1 epi, ROSC achieved. Proceeded with RSI for airway protection and respiratory failure. Patient appears extremely pale, given code in setting of GI bleed, concern for shock from blood loss. No vtach or vfib.  Asked RN to transfuse 2 units emergency release blood, start additional 2 large-bore IVs. Discussed with critical care service and they will send someone down to assume care. Discussed with Dr. Earnest Conroy with the hospitalist service. She will ensure family is notified. Dr. Patsey Berthold came to bedside.   .Critical Care Performed by: Lucrezia Starch, MD Authorized by: Lucrezia Starch, MD   Critical care provider statement:    Critical care time (minutes):  40   Critical care was necessary to treat or prevent imminent or life-threatening deterioration of the following conditions:  Shock   Critical care was time spent personally by me on the following activities:  Discussions with consultants, evaluation of patient's response to treatment, examination of patient, ordering and performing treatments and interventions, ordering and review of laboratory studies, ordering and review of radiographic studies, pulse oximetry, re-evaluation of patient's condition, obtaining history from patient or surrogate and review of old charts Comments:      \  CPR  Date/Time: 07/20/2020 8:17 PM Performed by: Lucrezia Starch, MD Authorized by: Lucrezia Starch, MD  CPR Procedure Details:    ACLS/BLS initiated by EMS: No     CPR/ACLS performed in the ED: Yes     Duration of CPR (minutes):  5   Outcome: ROSC obtained    CPR performed via ACLS guidelines under my direct supervision.  See RN  documentation for details including defibrillator use, medications, doses and timing. Comments:      PEA and asystole - no shockable rhythms    Procedure Name: Intubation Date/Time: 07/20/2020 8:17 PM Performed by: Lucrezia Starch, MD Pre-anesthesia Checklist: Patient identified and Timeout performed Oxygen Delivery Method: Ambu bag Induction Type: Rapid sequence Ventilation: Oral airway inserted - appropriate to patient size and Two handed mask ventilation required Laryngoscope Size: Glidescope and 4 Grade View: Grade I Tube size: 8.0 mm Number of attempts: 1 Airway Equipment and Method: Rigid stylet Secured at: 24 cm Tube secured with: Tape Comments:           Lucrezia Starch, MD 07/20/20 2019    Lucrezia Starch, MD 07/20/20 2023

## 2020-07-20 NOTE — ED Notes (Signed)
P[facility] [place] for Dr. Roslynn Amble at 7:58pm

## 2020-07-20 NOTE — ED Notes (Signed)
Dr. Patsey Berthold made aware pt noticed to be biting at ET. Will order for propofol.
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
## 2020-07-20 NOTE — ED Notes (Signed)
Consulted MD. Collene Mares at the bedside.

## 2020-07-20 NOTE — ED Notes (Signed)
Bladder scan done with 66ml urine.

## 2020-07-20 NOTE — Code Documentation (Signed)
100mg rocuronium given

## 2020-07-20 NOTE — ED Provider Notes (Signed)
I provided a substantive portion of the care of this patient.  I personally performed the entirety of the history for this encounter.   EKG Interpretation  Date/Time:  Monday July 20 2020 20:32:39 EST Ventricular Rate:  97 PR Interval:    QRS Duration: 99 QT Interval:  421 QTC Calculation: 535 R Axis:   24 Text Interpretation: Sinus rhythm Prolonged QT interval When compared with ECG of 07/04/2020, QT has lengthened Confirmed by Delora Fuel ([id number]) on 07/20/2020 11:51:13 PM Also confirmed by Delora Fuel ([id number]), editor [address], [place] [phone])  on 07/21/2020 8:50:05 AM  Patient with ESRD on dialysis.  Known history of erosive gastropathy with recent bleeding.  She has had no continued black stools and epigastric pain.  He has had increasing weakness and fatigue.  He is short of breath with activity now.  Patient is alert.  Mental status clear.  No respiratory distress at rest.  Abdomen soft without guarding, mild epigastric discomfort to palpation.   Jeremiah Shanks, MD 07/21/20 1630
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
## 2020-07-20 NOTE — ED Notes (Signed)
pt has right arm restrict -Called lab for straight stick.

## 2020-07-21 ENCOUNTER — Encounter (HOSPITAL_COMMUNITY)
Admission: EM | Disposition: A | Discharge status: Home / Self Care | Payer: Self-pay | Source: Home / Self Care | Attending: Internal Medicine

## 2020-07-21 ENCOUNTER — Inpatient Hospital Stay (HOSPITAL_COMMUNITY): Payer: Medicare Other

## 2020-07-21 DIAGNOSIS — D62 Acute posthemorrhagic anemia: Secondary | ICD-10-CM | POA: Diagnosis not present

## 2020-07-21 DIAGNOSIS — N186 End stage renal disease: Secondary | ICD-10-CM | POA: Diagnosis not present

## 2020-07-21 DIAGNOSIS — K922 Gastrointestinal hemorrhage, unspecified: Secondary | ICD-10-CM

## 2020-07-21 DIAGNOSIS — I469 Cardiac arrest, cause unspecified: Secondary | ICD-10-CM

## 2020-07-21 HISTORY — PX: ESOPHAGOGASTRODUODENOSCOPY (EGD) WITH PROPOFOL: SHX5813

## 2020-07-21 LAB — CBC
HCT: 20.9 % — ABNORMAL LOW (ref 39.0–52.0)
HCT: 26.3 % — ABNORMAL LOW (ref 39.0–52.0)
Hemoglobin: 7.2 g/dL — ABNORMAL LOW (ref 13.0–17.0)
Hemoglobin: 8.5 g/dL — ABNORMAL LOW (ref 13.0–17.0)
MCH: 27.7 pg (ref 26.0–34.0)
MCH: 26.3 pg (ref 26.0–34.0)
MCHC: 34.4 g/dL (ref 30.0–36.0)
MCHC: 32.3 g/dL (ref 30.0–36.0)
MCV: 80.4 fL (ref 80.0–100.0)
MCV: 81.4 fL (ref 80.0–100.0)
Platelets: 269 10*3/uL (ref 150–400)
Platelets: 273 10*3/uL (ref 150–400)
RBC: 2.6 MIL/uL — ABNORMAL LOW (ref 4.22–5.81)
RBC: 3.23 MIL/uL — ABNORMAL LOW (ref 4.22–5.81)
RDW: 19.9 % — ABNORMAL HIGH (ref 11.5–15.5)
RDW: 20.2 % — ABNORMAL HIGH (ref 11.5–15.5)
WBC: 5.6 10*3/uL (ref 4.0–10.5)
WBC: 5.4 10*3/uL (ref 4.0–10.5)
nRBC: 1.3 % — ABNORMAL HIGH (ref 0.0–0.2)
nRBC: 1.7 % — ABNORMAL HIGH (ref 0.0–0.2)

## 2020-07-21 LAB — BASIC METABOLIC PANEL
Anion gap: 21 — ABNORMAL HIGH (ref 5–15)
Anion gap: 20 — ABNORMAL HIGH (ref 5–15)
BUN: 103 mg/dL — ABNORMAL HIGH (ref 6–20)
BUN: 108 mg/dL — ABNORMAL HIGH (ref 6–20)
CO2: 19 mmol/L — ABNORMAL LOW (ref 22–32)
CO2: 22 mmol/L (ref 22–32)
Calcium: 7.7 mg/dL — ABNORMAL LOW (ref 8.9–10.3)
Calcium: 7.9 mg/dL — ABNORMAL LOW (ref 8.9–10.3)
Chloride: 94 mmol/L — ABNORMAL LOW (ref 98–111)
Chloride: 91 mmol/L — ABNORMAL LOW (ref 98–111)
Creatinine, Ser: 11.91 mg/dL — ABNORMAL HIGH (ref 0.61–1.24)
Creatinine, Ser: 12.88 mg/dL — ABNORMAL HIGH (ref 0.61–1.24)
GFR, Estimated: 5 mL/min — ABNORMAL LOW (ref >60)
GFR, Estimated: 4 mL/min — ABNORMAL LOW (ref >60)
Glucose, Bld: 409 mg/dL — ABNORMAL HIGH (ref 70–99)
Glucose, Bld: 315 mg/dL — ABNORMAL HIGH (ref 70–99)
Potassium: 5.3 mmol/L — ABNORMAL HIGH (ref 3.5–5.1)
Potassium: 4.7 mmol/L (ref 3.5–5.1)
Sodium: 134 mmol/L — ABNORMAL LOW (ref 135–145)
Sodium: 133 mmol/L — ABNORMAL LOW (ref 135–145)

## 2020-07-21 LAB — GLUCOSE, CAPILLARY
Glucose-Capillary: 105 mg/dL — ABNORMAL HIGH (ref 70–99)
Glucose-Capillary: 119 mg/dL — ABNORMAL HIGH (ref 70–99)
Glucose-Capillary: 129 mg/dL — ABNORMAL HIGH (ref 70–99)
Glucose-Capillary: 238 mg/dL — ABNORMAL HIGH (ref 70–99)
Glucose-Capillary: 27 mg/dL — CL (ref 70–99)
Glucose-Capillary: 305 mg/dL — ABNORMAL HIGH (ref 70–99)
Glucose-Capillary: 45 mg/dL — ABNORMAL LOW (ref 70–99)
Glucose-Capillary: 95 mg/dL (ref 70–99)
Glucose-Capillary: 96 mg/dL (ref 70–99)

## 2020-07-21 LAB — BRAIN NATRIURETIC PEPTIDE: B Natriuretic Peptide: 1269.5 pg/mL — ABNORMAL HIGH (ref 0.0–100.0)

## 2020-07-21 LAB — TRIGLYCERIDES: Triglycerides: 145 mg/dL (ref <150)

## 2020-07-21 LAB — ECHOCARDIOGRAM COMPLETE
Area-P 1/2: 1.89 cm2
Height: 71 in
S' Lateral: 4.1 cm
Weight: 2959.46 oz

## 2020-07-21 LAB — BLOOD PRODUCT ORDER (VERBAL) VERIFICATION

## 2020-07-21 LAB — TROPONIN I (HIGH SENSITIVITY)
Troponin I (High Sensitivity): 48 ng/L — ABNORMAL HIGH (ref <18)
Troponin I (High Sensitivity): 54 ng/L — ABNORMAL HIGH (ref <18)
Troponin I (High Sensitivity): 63 ng/L — ABNORMAL HIGH (ref <18)

## 2020-07-21 LAB — MRSA PCR SCREENING: MRSA by PCR: NEGATIVE

## 2020-07-21 SURGERY — ESOPHAGOGASTRODUODENOSCOPY (EGD) WITH PROPOFOL
Anesthesia: Monitor Anesthesia Care | Laterality: Left

## 2020-07-21 SURGERY — ESOPHAGOGASTRODUODENOSCOPY (EGD) WITH PROPOFOL
Anesthesia: Moderate Sedation

## 2020-07-21 MED ORDER — MIDAZOLAM HCL (PF) 5 MG/ML IJ SOLN
INTRAMUSCULAR | Status: AC
  Filled 2020-07-21: qty 2

## 2020-07-21 MED ORDER — CHLORHEXIDINE GLUCONATE 0.12% ORAL RINSE (MEDLINE KIT)
15.0000 mL | Freq: Two times a day (BID) | OROMUCOSAL | Status: DC
  Administered 2020-07-21: 15 mL via OROMUCOSAL

## 2020-07-21 MED ORDER — INSULIN ASPART 100 UNIT/ML ~~LOC~~ SOLN
0.0000 [IU] | SUBCUTANEOUS | Status: DC
  Administered 2020-07-21: 4 [IU] via SUBCUTANEOUS

## 2020-07-21 MED ORDER — DEXTROSE-NACL 10-0.45 % IV SOLN
INTRAVENOUS | Status: DC
  Filled 2020-07-21: qty 1000

## 2020-07-21 MED ORDER — DEXMEDETOMIDINE HCL IN NACL 400 MCG/100ML IV SOLN
0.4000 ug/kg/h | INTRAVENOUS | Status: DC
  Administered 2020-07-21: 0.4 ug/kg/h via INTRAVENOUS
  Administered 2020-07-21: 0.6 ug/kg/h via INTRAVENOUS
  Administered 2020-07-22: 0.7 ug/kg/h via INTRAVENOUS
  Filled 2020-07-21 (×2): qty 100

## 2020-07-21 MED ORDER — DEXTROSE 50 % IV SOLN
INTRAVENOUS | Status: AC
  Administered 2020-07-21: 50 mL
  Filled 2020-07-21: qty 50

## 2020-07-21 MED ORDER — FENTANYL CITRATE (PF) 100 MCG/2ML IJ SOLN
INTRAMUSCULAR | Status: AC
  Filled 2020-07-21: qty 4

## 2020-07-21 MED ORDER — SODIUM CHLORIDE 0.9 % IV SOLN: INTRAVENOUS | Status: DC

## 2020-07-21 MED ORDER — ORAL CARE MOUTH RINSE
15.0000 mL | OROMUCOSAL | Status: DC
  Administered 2020-07-21 – 2020-07-22 (×8): 15 mL via OROMUCOSAL

## 2020-07-21 MED ORDER — DIPHENHYDRAMINE HCL 50 MG/ML IJ SOLN
INTRAMUSCULAR | Status: AC
  Filled 2020-07-21: qty 1

## 2020-07-21 MED ORDER — INSULIN ASPART 100 UNIT/ML ~~LOC~~ SOLN
0.0000 [IU] | SUBCUTANEOUS | Status: DC
  Administered 2020-07-21: 5 [IU] via SUBCUTANEOUS

## 2020-07-21 MED ORDER — CHLORHEXIDINE GLUCONATE CLOTH 2 % EX PADS
6.0000 | MEDICATED_PAD | Freq: Every day | CUTANEOUS | Status: DC
  Administered 2020-07-21 – 2020-07-24 (×4): 6 via TOPICAL

## 2020-07-21 MED ORDER — INSULIN GLARGINE 100 UNIT/ML ~~LOC~~ SOLN
10.0000 [IU] | Freq: Every day | SUBCUTANEOUS | Status: DC
  Administered 2020-07-21: 10 [IU] via SUBCUTANEOUS
  Filled 2020-07-21 (×2): qty 0.1

## 2020-07-21 SURGICAL SUPPLY — 15 items

## 2020-07-21 NOTE — Progress Notes (Signed)
eLink Physician-Brief Progress Note Patient Name: JOURDEN GILSON DOB: 24-Apr-1966 MRN: [id number]   Date of Service  07/21/2020  HPI/Events of Note  Notified of hypoglycemic episodes requiring D50. Remains NPO. ESRD on HD  eICU Interventions  Ordered D10 1/2 NS at 20 /hr     Intervention Category Major Interventions: Other:  Judd Lien 07/21/2020, 8:55 PM

## 2020-07-21 NOTE — Progress Notes (Signed)
[facility] Progress Note Patient Name: Jeremiah [name] DOB: 1966/02/27 MRN: [id number]   Date of Service  07/21/2020  HPI/Events of Note  Multiple issues: 1. Patient intubated and ventilated. NPO. Currently on AC/HS very sensitive Novolog SSI. 2. Nursing request for AM ABG. 3. Troponin = 48 and 54. In setting of post cardiac arrest. EKG reveals sinus rhythm. Prolonged QT interval. When compared with ECG of 07/04/2020, QT has lengthened.   eICU Interventions  Plan: 1. Change to Q 4 hour very sensitive Novolog SSI. 2. ABG at 8 AM. 3. Continue to trend Troponin.     Intervention Category Major Interventions: Hyperglycemia - active titration of insulin therapy;Other:  Jakirah Zaun Cornelia Copa 07/21/2020, 4:04 AM

## 2020-07-21 NOTE — ED Notes (Signed)
Attempted to give reportx1 

## 2020-07-21 NOTE — Progress Notes (Signed)
[facility] Progress Note   Subjective:   Yesterday in the ED, patient vomiting and said he did not fell well, then became bradycardic and went into PEA arrest. ROSC after epi x1, intubated for apnea. Now sedated. Hgb 8.2 > 7.1 this AM. Seen at end of HD, was hypertensive at the start but BP quickly normalized.   Objective Vitals:   07/21/20 0845 07/21/20 0900 07/21/20 0930 07/21/20 0945  BP: (!) 121/49 (!) 121/51 (!) 113/52 (!) 121/97  Pulse: 64 64 65 65  Resp: 18 18 18 18   Temp:      TempSrc:      SpO2: 100% 100% 100% 100%  Weight:      Height:       Physical Exam General: Well developed male, sedated and intubated Heart: RRR, no murmurs, rubs or gallops Lungs: Intubated. CTA anteriorly, no rales auscultated Abdomen: Soft, non-distended, +BS Extremities: No edema b/l lower extremities Dialysis Access:  AVF accessed  Additional Objective Labs: Basic Metabolic Panel: Recent Labs  Lab 07/20/20 0427 07/20/20 2222 07/20/20 2231 07/21/20 0558  NA 134* 132* 134* 133*  K 4.3 5.1 5.3* 4.7  CL 93*  --  94* 91*  CO2 24  --  19* 22  GLUCOSE 342*  --  409* 315*  BUN 92*  --  103* 108*  CREATININE 11.27*  --  11.91* 12.88*  CALCIUM 8.1*  --  7.7* 7.9*   Liver Function Tests: Recent Labs  Lab 07/20/20 0427  AST 46*  ALT 36  ALKPHOS 70  BILITOT 0.5  PROT 5.8*  ALBUMIN 3.0*   No results for input(s): LIPASE, AMYLASE in the last 168 hours. CBC: Recent Labs  Lab 07/20/20 0427 07/20/20 1206 07/20/20 1837 07/20/20 2222 07/20/20 2231 07/21/20 0558  WBC 5.7  --  6.4  --   --  5.6  HGB 5.2*   < > 6.1* 8.2* 7.1* 7.2*  HCT 16.6*   < > 19.9* 24.0* 21.4* 20.9*  MCV 80.2  --  81.6  --   --  80.4  PLT 278  --  307  --   --  269   < > = values in this interval not displayed.   Blood Culture    Component Value Date/Time   SDES BLOOD LEFT ANTECUBITAL 06/17/2019 2011   SPECREQUEST  06/17/2019 2011    BOTTLES DRAWN AEROBIC AND ANAEROBIC Blood Culture  adequate volume   CULT  06/17/2019 2011    NO GROWTH 5 DAYS Performed at [facility], [place] [address] [place], [place] [address]    REPTSTATUS 06/22/2019 FINAL 06/17/2019 2011    Cardiac Enzymes: No results for input(s): CKTOTAL, CKMB, CKMBINDEX, TROPONINI in the last 168 hours. CBG: Recent Labs  Lab 07/20/20 1942 07/20/20 2225 07/21/20 0559 07/21/20 0943  GLUCAP 328* 382* 305* 129*   Iron Studies: No results for input(s): IRON, TIBC, TRANSFERRIN, FERRITIN in the last 72 hours. @lablastinr3 @ Studies/Results: DG Chest Portable 1 View  Result Date: 07/20/2020 CLINICAL DATA:  55 year old male status post intubation. EXAM: PORTABLE CHEST 1 VIEW COMPARISON:  Earlier chest radiograph dated 07/20/2020. FINDINGS: Endotracheal tube with tip approximately 6.5 cm above the carina. Enteric tube extends below the diaphragm with tip beyond the inferior margin of the image. Cardiomegaly with vascular congestion and edema. Superimposed pneumonia is not excluded clinical correlation is recommended. Overall interval progression of pulmonary opacities compared to the earlier radiograph. No pleural effusion pneumothorax. No acute osseous pathology. IMPRESSION: 1. Endotracheal tube above  the carina. 2. Cardiomegaly with findings of CHF. Superimposed pneumonia is not excluded. Clinical correlation is recommended. Electronically Signed   By: Anner Crete M.D.   On: 07/20/2020 20:10   DG Chest Port 1 View  Result Date: 07/20/2020 CLINICAL DATA:  Confusion, no dialysis for several days EXAM: PORTABLE CHEST 1 VIEW COMPARISON:  07/04/2020 FINDINGS: Pulmonary vascular congestion. Improved pulmonary edema. No pleural effusion. Top-normal heart size. IMPRESSION: Improved pulmonary edema with persistent pulmonary vascular congestion. Electronically Signed   By: Macy Mis M.D.   On: 07/20/2020 12:40   Medications: . fentaNYL infusion INTRAVENOUS 100 mcg/hr (07/21/20 0700)  . pantoprozole (PROTONIX)  infusion 6.4 mg/hr (07/21/20 0700)  . propofol (DIPRIVAN) infusion 15 mcg/kg/min (07/21/20 0700)   . amLODipine  10 mg Oral Daily  . carvedilol  25 mg Oral QHS  . Chlorhexidine Gluconate Cloth  6 each Topical Q0600  . ferric citrate  210 mg Oral TID WC  . hydrALAZINE  100 mg Oral TID  . insulin aspart  0-15 Units Subcutaneous Q4H  . insulin glargine  10 Units Subcutaneous Daily  . multivitamin  1 tablet Oral TID WC    Dialysis Orders: Center: [facility] on MWF. 180NRe, 4 hours, BFR 450 DFR 800 EDW 75kg 2K/2.25Ca, AVF 15g, heparin 4000 unit bolus Mircera 225 mcg IV q 2 weeks- last dose 07/13/20 Venofer 50mg  weekly Hectorol 4 mcg IVP q HD  Assessment/Plan: 1.  Cardiac arrest: PEA arrest in the ED on 07/20/20. ?Vasovagal exacerbated by anemia. Now intubated and sedated in ICU. K+ was not significantly elevated. Management Per critical care team.  2. Acute on chronic anemia:  With melena, recent endoscopy and colonoscopy on 07/10/20. Received PRBC on admission. Plan per GI/admitting team. Will hold heparin with HD. Not due for ESA dose yet.  3.  ESRD:  Dialyzes MWF, last HD Friday. HD rolled over to this AM due to high census/staffing shortage. Pt tolerated iHD. continue MWF schedule.   4.  Hypertension/volume: Chronic volume overload with mild edema/congestion on CXR but O2 sat 100% on RA on admission. Repeat CXR consistent with pulm edema. Had HD today with about 3L net UF. BP normalized. Weights still up, likely 2/2 blood transfusion. Further UF with HD tomorrow as tolerated. 5.  Metabolic bone disease: Calcium slightly low, no phos reported yet.Will use higher Ca bath with HD. Continue hectorol and resume binders once eating.  6.  Nutrition:  Currently NPO 7. T2DM: Poorly controlled (BS in 400's at dialysis on Friday) and it seems patient is a bit confused about his insulin regimen. Insulin per admitting team.    Anice Paganini, PA-C 07/21/2020, 10:11 AM  [facility] Pager: [phone]

## 2020-07-21 NOTE — Interval H&P Note (Signed)
History and Physical Interval Note:  07/21/2020 12:07 PM  Jeremiah [name]  has presented today for surgery, with the diagnosis of GI bleed.  The various methods of treatment have been discussed with the patient and family. After consideration of risks, benefits and other options for treatment, the patient has consented to  Procedure(s): ESOPHAGOGASTRODUODENOSCOPY (EGD) WITH PROPOFOL (N/A) as a surgical intervention.  The patient's history has been reviewed, patient examined, no change in status, stable for surgery.  I have reviewed the patient's chart and labs.  Questions were answered to the patient's satisfaction.     Hildreth Orsak D

## 2020-07-21 NOTE — Plan of Care (Signed)

## 2020-07-21 NOTE — Op Note (Addendum)
[facility] Patient Name: Jeremiah [name] Procedure Date : 07/21/2020 MRN: [id number] Attending MD: Carol Ada , MD Date of Birth: 1965/12/04 CSN: [id number] Age: 55 Admit Type: Inpatient Procedure:                Upper GI endoscopy Indications:              Acute post hemorrhagic anemia, Melena Providers:                Carol Ada, MD, Kary Kos RN, RN, Nelia Shi, RN, Lesia Sago, Technician Referring MD:              Medicines:                Propofol per Anesthesia Complications:            No immediate complications. Estimated Blood Loss:     Estimated blood loss: none. Procedure:                Pre-Anesthesia Assessment:                           - Prior to the procedure, a History and Physical                            was performed, and patient medications and                            allergies were reviewed. The patient's tolerance of                            previous anesthesia was also reviewed. The risks                            and benefits of the procedure and the sedation                            options and risks were discussed with the patient.                            All questions were answered, and informed consent                            was obtained. Prior Anticoagulants: The patient has                            taken no previous anticoagulant or antiplatelet                            agents. ASA Grade Assessment: IV - A patient with                            severe systemic disease that is a constant threat  to life. After reviewing the risks and benefits,                            the patient was deemed in satisfactory condition to                            undergo the procedure.                           - Sedation was administered by an anesthesia                            professional. Deep sedation was attained.                           After  obtaining informed consent, the endoscope was                            passed under direct vision. Throughout the                            procedure, the patient's blood pressure, pulse, and                            oxygen saturations were monitored continuously. The                            PCF-H190DL ([id number] Olympus pediatric colonoscope                            was introduced through the mouth, and advanced to                            the second part of duodenum. The upper GI endoscopy                            was accomplished without difficulty. The patient                            tolerated the procedure well. Scope In: Scope Out: Findings:      The esophagus was normal.      Localized moderate inflammation characterized by erythema was found in       the gastric fundus.      One non-bleeding superficial duodenal ulcer with no stigmata of bleeding       was found in the duodenal bulb. The lesion was 3 mm in largest dimension.      There was no evidence of any active or recent bleeding in the examined       upper GI tract. The prior gastritis in the fundus of the stomach was not       bleeding and the biopsies on 07/10/2020 showed a nonspecific acute       gastritis. A small superficial distal duodenal bulb ulcer was found. The       current findings cannot explain this patient's severe anemia. Impression:               -  Normal esophagus.                           - Gastritis.                           - Non-bleeding duodenal ulcer with no stigmata of                            bleeding.                           - No specimens collected. Recommendation:           - Return patient to ICU for ongoing care.                           - NPO.                           - Continue present medications.                           - Monitor HGB and transfuse as necessary.                           - If the patient continues to have a GI bleed,                             while intubated, a bleeding scan needs to be                            performed.                           - If the patient continues to have a GI bleed, but                            he is extubated, a capsule endoscopy needs to be                            performed. Procedure Code(s):        --- Professional ---                           [phone], Esophagogastroduodenoscopy, flexible,                            transoral; diagnostic, including collection of                            specimen(s) by brushing or washing, when performed                            (separate procedure) Diagnosis Code(s):        --- Professional ---  K29.70, Gastritis, unspecified, without bleeding                           K26.9, Duodenal ulcer, unspecified as acute or                            chronic, without hemorrhage or perforation                           D62, Acute posthemorrhagic anemia                           K92.1, Melena (includes Hematochezia) CPT copyright 2019 American Medical Association. All rights reserved. The codes documented in this report are preliminary and upon coder review may  be revised to meet current compliance requirements. Carol Ada, MD Carol Ada, MD 07/21/2020 12:52:09 PM This report has been signed electronically. Number of Addenda: 0

## 2020-07-21 NOTE — Progress Notes (Signed)
Echocardiogram 2D Echocardiogram has been performed.  Oneal Deputy Rhylei Mcquaig 07/21/2020, 11:53 AM

## 2020-07-21 NOTE — Progress Notes (Signed)
Inpatient Diabetes Program Recommendations  AACE/ADA: New Consensus Statement on Inpatient Glycemic Control (2015)  Target Ranges:  Prepandial:   less than 140 mg/dL      Peak postprandial:   less than 180 mg/dL (1-2 hours)      Critically ill patients:  140 - 180 mg/dL   Lab Results  Component Value Date   GLUCAP 119 (H) 07/21/2020   HGBA1C 10.7 (H) 07/04/2020    Review of Glycemic Control  Diabetes history: DM2 Outpatient Diabetes medications: Lantus 10-20 units QAM (10 units CBG< 300 and 20 units if CBG> 300) Current orders for Inpatient glycemic control: Lantus 10 units QD, Novolog 0-15 units Q4h  HgbA1C - 10.7% Hypoglycemia requiring D50 for CBG of 27 and 45 mg/dL.   Inpatient Diabetes Program Recommendations:     Decrease Lantus to 8 units QD Decrease Novolog to 0-9 units Q4H If po's of TFs begin, will need CHO coverage - Novolog 3 units Q4H.  Will follow closely.  Thank you. Lorenda Peck, RD, LDN, CDE Inpatient Diabetes Coordinator [phone]

## 2020-07-21 NOTE — Progress Notes (Signed)
eLink Physician-Brief Progress Note Patient Name: Jeremiah [name] DOB: 09-27-1965 MRN: [id number]   Date of Service  07/21/2020  HPI/Events of Note  55 yo man with hx of melena, came to ED for episode of melena and anemia, weakness and fatigue, bradycardia to PEA arrest in ED. Now intubated and ventilated. Sedated with Propofol IV infusion at 80 mcg/kg/min and Fentanyl IV infusion at 75 mcg/hour. HR = 54. Likely d/t Propofol IV infusion.   eICU Interventions  Plan: 1 . Titrate Fentanyl IV infusion for RASS = 0 to -1. 2. Wean Propofol IV infusion as tolerated.      Intervention Category Evaluation Type: New Patient Evaluation  Lysle Dingwall 07/21/2020, 2:04 AM

## 2020-07-21 NOTE — Progress Notes (Addendum)
NAME:  Jeremiah [name], MRN:  [id number], DOB:  [birth year], LOS: 1 ADMISSION DATE:  07/20/2020, CONSULTATION DATE:  07/20/20 REFERRING MD:  Reita May, CHIEF COMPLAINT:  Melena, cardiac arrest in ED  Brief History:  55 yo man with hx of melena, came to ED for episode of melena, weakness, and fatigue.  Found to be anemic 5.2.  Developed bradycardia leading to PEA arrest in ED.   History of Present Illness:   29 yoM here with melena x 2 days.  Weakness and fatigue.   Recent admission this month for melena, endoscopy revealed erosive  gastropathy.   Intital Hb 5.2 up to 6.1 after 1 unit pRBC While in ED, received 1 unit of blood with appropriating increased hb.   Patient ate a little, then started vomiting.  After emesis, said he didn't feel good, felt like he had low blood sugar.  HR became bradycardic, lost consciousness, lost pulses.  Brief cardiac arrest, ROSC after epi x 1.  Intubated for apnea.   Now waking up, trying to pull out tube  Past Medical History:  Melena (admitted 07/10/20) - gastropathy CKD on HD DMII, diabetic nephropathy HTN OA  Takes norvasc, asa, hydral 100 TID, insulin 10-20 u glargine Significant Hospital Events:  PEA cardiac arrest 1/24  Consults:  GI (previously seen by Dr. Benson Norway)  Procedures:  1/24 ETT >> 1/25 iHD  Significant Diagnostic Tests:  CXR  IMPRESSION: 1. Endotracheal tube above the carina. 2. Cardiomegaly with findings of CHF. Superimposed pneumonia is not excluded. Clinical correlation is recommended.  EKG sinus.    7/2019echo  Study Conclusions  - Left ventricle: The cavity size was normal. There was severe  concentric hypertrophy. Systolic function was vigorous. The  estimated ejection fraction was in the range of 65% to 70%. Wall  motion was normal; there were no regional wall motion  abnormalities. Doppler parameters are consistent with abnormal  left ventricular relaxation (grade 1 diastolic dysfunction). The   E/e&' ratio is between 8-15, suggesting indeterminate LV filling  pressure.  Impressions: - Compared to a prior study in 2016, the LVEF is higher at 65-70%  wtih severe LV wall thicknening. GIven the appearance of the  myocardium, would consider possible work-up for infiltrative  cardioymopathy or consider HCM.   Recent colonoscopy 07/10/20: Normal esophagus.                           - Erosive gastropathy with stigmata of recent                            bleeding. Biopsied.                           - Normal examined duodenum.  Path: FINAL MICROSCOPIC DIAGNOSIS:   A. STOMACH, FUNDUS, BIOPSY:  - Gastric mucosa with hyperemia and focal acute inflammation.  - Warthin-Starry negative for Helicobacter pylori.  - No intestinal metaplasia, dysplasia or carcinoma.   B. COLON, HEPATIC FLEXURE, POLYPECTOMY:  - Colonic mucosa with benign lymphoid aggregate.  - No adenomatous change or carcinoma.   Micro Data:  1/24 SARS >> neg 1/25 MRSA PCR >>   Antimicrobials:  n/a  Interim History / Subjective:  On iHD, goal is 3-3.5L if BP tolerates No further bleeding episodes Hgb trend stable post transfusion 6.1-> 8.2-> 7.1-> 8.2 PPI gtt ongoing  Remains sedated on fentanyl/ propofol, did  wake up overnight wild when sedation reduced, not following commands per bedside RN  Objective   Blood pressure (!) 192/58, pulse 66, temperature 97.6 F (36.4 C), temperature source Oral, resp. rate 18, height 5\' 11"  (1.803 m), weight 83.9 kg, SpO2 100 %.    Vent Mode: PRVC FiO2 (%):  [40 %-100 %] 40 % Set Rate:  [18 bmp] 18 bmp Vt Set:  [600 mL] 600 mL PEEP:  [5 cmH20] 5 cmH20 Plateau Pressure:  [18 cmH20-19 cmH20] 19 cmH20   Intake/Output Summary (Last 24 hours) at 07/21/2020 0730 Last data filed at 07/20/2020 1958 Gross per 24 hour  Intake 600 ml  Output -  Net 600 ml   Filed Weights   07/20/20 1150 07/21/20 0300  Weight: 79.8 kg 83.9 kg   Examination:sedated on propofol 15  mcg/kg/min and fentanyl gtt 100 mcg/hr  General:  Adult male sedated and intubated on MV HEENT: MM pink/moist, ETT/ OGT- with bilious drainage- no visible blood, pupils 3/sluggish  Neuro: sedated, does not f/c CV: rr, SR- SB, no murmur, RUE AVF  PULM:  Non labored, CTA GI: soft, bs active, ND, no foley   Extremities: warm/dry, +2LE edema  Skin: no rashes   Afebrile - temp down to 93.9 overnight  Labs reviewed  no am CXR  Resolved Hospital Problem list     Assessment & Plan:  Cardiac arrest: unclear cause. Vasovagal initially? Exacerbated by anemia? EKG non-acute showing prolonged QTc  - troponin trend flat thus far, repeat this am; EKG repeated- SR 63, prolonged QTc improving 0.535-> 0.525, will need to monitor closely, stable on PPI gtt, avoid other QTc prolonging meds - TTE ordered - remains normotensive, slight bradycardia into the 50's thought associated with propofol but remains sinus in the 60's currently  - Hgb trend stable after transfusion, with no further bleeding    Hypoxic respiratory failure  -Continue MV support, 8cc/kg IBW with goal Pplat <30 and DP<15  -VAP prevention protocol/ PPI -PAD protocol for sedation> minimize propofol and fentanyl gtts -wean FiO2 as able for SpO2 >92%  -intermittent CXR/ ABG -will need to wait for GI's plans, ?possible EGD today prior to decision to extubate, but otherwise expect SBT/ hopeful extubation   - CXR from yesterday reviewed, c/w pulmonary edema, can not rule out component of aspiration but remains afebrile, normal WBC and minimal vent settings.  Will clinically monitor for now   Encephalopathy after cardiac arrest, - making purposeful movements post ROSC - avoid fever - ongoing neuro exams  - minimize sedation     HTN - continue home norvasc 10 mg daily, coreg 25mg  daily, hydralazine 100mg  TID - iHD for volume removal    Melena - recent EGD and colonoscopy by Dr. Benson Norway on 07/10/2020 c/w erosive gastropathy and one  polyp at the hepatic flexure - trend H/H - OGT contents thus far bilious, no further BMs - GI consulted, Dr. Benson Norway, appreciate recs  - continue PPI  - continue sucralfate  ESRD AGMA - per nephrology - iHD today, goal 3- 3.5L UF - trend renal indices - bladder scan q shift, still makes some urine appears    DMII with hyperglycemia  - increased SSI to moderate, added home lantus 10 units daily   Anemia of chronic disease -  Hgb stable/ trend    Best practice (evaluated daily)  Diet: remains NPO Pain/Anxiety/Delirium protocol (if indicated): propofol/fentanyl  VAP protocol (if indicated): yes DVT prophylaxis: SCD GI prophylaxis: Protonix gtt  Glucose control: SSI/ lantus  Mobility: Bed  Disposition:ICU  Goals of Care:  Last date of multidisciplinary goals of care discussion Family and staff present:  Summary of discussion:  Follow up goals of care discussion due:  Code Status: Full code.  Overnight team spoke with Significant other Ms. Ronnald Ramp. Pending update 1/25, no family at bedside.   Labs   CBC: Recent Labs  Lab 07/20/20 0427 07/20/20 1206 07/20/20 1837 07/20/20 2222 07/20/20 2231 07/21/20 0558  WBC 5.7  --  6.4  --   --  5.6  HGB 5.2* 5.4* 6.1* 8.2* 7.1* 7.2*  HCT 16.6* 18.0* 19.9* 24.0* 21.4* 20.9*  MCV 80.2  --  81.6  --   --  80.4  PLT 278  --  307  --   --  735    Basic Metabolic Panel: Recent Labs  Lab 07/20/20 0427 07/20/20 2222 07/20/20 2231 07/21/20 0558  NA 134* 132* 134* 133*  K 4.3 5.1 5.3* 4.7  CL 93*  --  94* 91*  CO2 24  --  19* 22  GLUCOSE 342*  --  409* 315*  BUN 92*  --  103* 108*  CREATININE 11.27*  --  11.91* 12.88*  CALCIUM 8.1*  --  7.7* 7.9*   GFR: Estimated Creatinine Clearance: 6.9 mL/min (A) (by C-G formula based on SCr of 12.88 mg/dL (H)). Recent Labs  Lab 07/20/20 0427 07/20/20 1837 07/21/20 0558  WBC 5.7 6.4 5.6    Liver Function Tests: Recent Labs  Lab 07/20/20 0427  AST 46*  ALT 36  ALKPHOS 70   BILITOT 0.5  PROT 5.8*  ALBUMIN 3.0*   No results for input(s): LIPASE, AMYLASE in the last 168 hours. No results for input(s): AMMONIA in the last 168 hours.  ABG    Component Value Date/Time   PHART 7.473 (H) 07/20/2020 2222   PCO2ART 31.7 (L) 07/20/2020 2222   PO2ART 103 07/20/2020 2222   HCO3 23.3 07/20/2020 2222   TCO2 24 07/20/2020 2222   O2SAT 98.0 07/20/2020 2222     Coagulation Profile: Recent Labs  Lab 07/20/20 1206  INR 1.2    Cardiac Enzymes: No results for input(s): CKTOTAL, CKMB, CKMBINDEX, TROPONINI in the last 168 hours.  HbA1C: HbA1c, POC (controlled diabetic range)  Date/Time Value Ref Range Status  09/03/2019 09:15 AM 10.1 (A) 0.0 - 7.0 % Final  12/04/2018 10:38 AM 9.0 (A) 0.0 - 7.0 % Final   Hgb A1c MFr Bld  Date/Time Value Ref Range Status  07/04/2020 12:52 PM 10.7 (H) 4.8 - 5.6 % Final    Comment:    (NOTE) Pre diabetes:          5.7%-6.4%  Diabetes:              >6.4%  Glycemic control for   <7.0% adults with diabetes   05/12/2020 04:59 AM 9.8 (H) 4.8 - 5.6 % Final    Comment:    (NOTE) Pre diabetes:          5.7%-6.4%  Diabetes:              >6.4%  Glycemic control for   <7.0% adults with diabetes     CBG: Recent Labs  Lab 07/20/20 1942 07/20/20 2225 07/21/20 0559  GLUCAP 328* 382* 305*    Critical care time: 35 minutes     Kennieth Rad, ACNP [facility] 07/21/2020, 7:30 AM

## 2020-07-22 ENCOUNTER — Inpatient Hospital Stay (HOSPITAL_COMMUNITY): Payer: Medicare Other

## 2020-07-22 DIAGNOSIS — E1165 Type 2 diabetes mellitus with hyperglycemia: Secondary | ICD-10-CM

## 2020-07-22 DIAGNOSIS — I469 Cardiac arrest, cause unspecified: Secondary | ICD-10-CM | POA: Diagnosis not present

## 2020-07-22 DIAGNOSIS — K922 Gastrointestinal hemorrhage, unspecified: Secondary | ICD-10-CM | POA: Diagnosis not present

## 2020-07-22 DIAGNOSIS — D62 Acute posthemorrhagic anemia: Secondary | ICD-10-CM | POA: Diagnosis not present

## 2020-07-22 DIAGNOSIS — N186 End stage renal disease: Secondary | ICD-10-CM | POA: Diagnosis not present

## 2020-07-22 LAB — BASIC METABOLIC PANEL
Anion gap: 16 — ABNORMAL HIGH (ref 5–15)
BUN: 66 mg/dL — ABNORMAL HIGH (ref 6–20)
CO2: 23 mmol/L (ref 22–32)
Calcium: 7.9 mg/dL — ABNORMAL LOW (ref 8.9–10.3)
Chloride: 98 mmol/L (ref 98–111)
Creatinine, Ser: 10.36 mg/dL — ABNORMAL HIGH (ref 0.61–1.24)
GFR, Estimated: 5 mL/min — ABNORMAL LOW (ref >60)
Glucose, Bld: 101 mg/dL — ABNORMAL HIGH (ref 70–99)
Potassium: 4 mmol/L (ref 3.5–5.1)
Sodium: 137 mmol/L (ref 135–145)

## 2020-07-22 LAB — CBC
HCT: 26.7 % — ABNORMAL LOW (ref 39.0–52.0)
Hemoglobin: 8.7 g/dL — ABNORMAL LOW (ref 13.0–17.0)
MCH: 26.7 pg (ref 26.0–34.0)
MCHC: 32.6 g/dL (ref 30.0–36.0)
MCV: 81.9 fL (ref 80.0–100.0)
Platelets: 264 10*3/uL (ref 150–400)
RBC: 3.26 MIL/uL — ABNORMAL LOW (ref 4.22–5.81)
RDW: 20.5 % — ABNORMAL HIGH (ref 11.5–15.5)
WBC: 4.7 10*3/uL (ref 4.0–10.5)
nRBC: 1.1 % — ABNORMAL HIGH (ref 0.0–0.2)

## 2020-07-22 LAB — GLUCOSE, CAPILLARY
Glucose-Capillary: 102 mg/dL — ABNORMAL HIGH (ref 70–99)
Glucose-Capillary: 100 mg/dL — ABNORMAL HIGH (ref 70–99)
Glucose-Capillary: 118 mg/dL — ABNORMAL HIGH (ref 70–99)
Glucose-Capillary: 156 mg/dL — ABNORMAL HIGH (ref 70–99)
Glucose-Capillary: 279 mg/dL — ABNORMAL HIGH (ref 70–99)

## 2020-07-22 MED ORDER — HEPARIN SODIUM (PORCINE) 1000 UNIT/ML IJ SOLN
INTRAMUSCULAR | Status: AC
  Filled 2020-07-22: qty 4

## 2020-07-22 MED ORDER — INSULIN ASPART 100 UNIT/ML ~~LOC~~ SOLN
0.0000 [IU] | SUBCUTANEOUS | Status: DC
  Administered 2020-07-22: 3 [IU] via SUBCUTANEOUS
  Administered 2020-07-23 (×2): 2 [IU] via SUBCUTANEOUS
  Administered 2020-07-23 (×3): 3 [IU] via SUBCUTANEOUS

## 2020-07-22 MED ORDER — DOCUSATE SODIUM 100 MG PO CAPS: 100.0000 mg | ORAL_CAPSULE | Freq: Two times a day (BID) | ORAL | Status: DC | PRN

## 2020-07-22 MED ORDER — INSULIN GLARGINE 100 UNIT/ML ~~LOC~~ SOLN
8.0000 [IU] | Freq: Every day | SUBCUTANEOUS | Status: DC
  Administered 2020-07-23 – 2020-07-24 (×2): 8 [IU] via SUBCUTANEOUS
  Filled 2020-07-22 (×3): qty 0.08

## 2020-07-22 MED ORDER — PANTOPRAZOLE SODIUM 40 MG IV SOLR
40.0000 mg | Freq: Two times a day (BID) | INTRAVENOUS | Status: DC
  Administered 2020-07-22 – 2020-07-25 (×7): 40 mg via INTRAVENOUS
  Filled 2020-07-22 (×6): qty 40

## 2020-07-22 MED ORDER — ACETAMINOPHEN 325 MG PO TABS
650.0000 mg | ORAL_TABLET | ORAL | Status: DC | PRN
  Administered 2020-07-22: 650 mg via ORAL
  Filled 2020-07-22: qty 2

## 2020-07-22 MED ORDER — POLYETHYLENE GLYCOL 3350 17 G PO PACK: 17.0000 g | PACK | Freq: Every day | ORAL | Status: DC | PRN

## 2020-07-22 NOTE — Progress Notes (Signed)
Wasted 220ml  Of fentayl in stericycle. witnessed by [employer].

## 2020-07-22 NOTE — Progress Notes (Signed)
Subjective: Events since admission noted when the patient had cardiac arrest/PEA requiring intubation was just extubated this morning.  Patient denies any GI problems at this time.  He has not had a BM since I last saw him on Monday, 07/20/2020.  He is tolerating full liquids well.  He has had a colonoscopy on 07/10/2020 when a small polyp was removed and EGD at that time that revealed some fresh heme in the fundus thought to be from patchy gastritis.  Repeat EGD after admission on Monday, 07/20/2018 revealed a small nonbleeding ulcer in the duodenal bulb and mild gastritis in the fundus with no fresh or old heme on exam.  Objective: Vital signs in last 24 hours: Temp:  [97 F (36.1 C)-98.7 F (37.1 C)] 97.2 F (36.2 C) (01/26 1145) Pulse Rate:  [60-91] 88 (01/26 1400) Resp:  [9-25] 17 (01/26 1315) BP: (78-175)/(48-117) 151/57 (01/26 1400) SpO2:  [98 %-100 %] 99 % (01/26 1400) FiO2 (%):  [40 %] 40 % (01/26 0720) Weight:  [82.1 kg-84.7 kg] 82.1 kg (01/26 1145)    Intake/Output from previous day: 01/25 0701 - 01/26 0700 In: 173 [I.V.:173] Out: 3850 [Emesis/NG output:850] Intake/Output this shift: Total I/O In: 482.2 [I.V.:482.2] Out: 2571 [Other:2571]  General appearance: alert, cooperative, appears stated age and no distress Resp: clear to auscultation bilaterally Cardio: regular rate and rhythm, S1, S2 normal, no murmur, click, rub or gallop GI: soft, non-tender; bowel sounds normal; no masses,  no organomegaly Extremities: extremities normal, atraumatic, no cyanosis or edema  Lab Results: Recent Labs    07/21/20 0558 07/21/20 1723 07/22/20 0634  WBC 5.6 5.4 4.7  HGB 7.2* 8.5* 8.7*  HCT 20.9* 26.3* 26.7*  PLT 269 273 264   BMET Recent Labs    07/20/20 2231 07/21/20 0558 07/22/20 0634  NA 134* 133* 137  K 5.3* 4.7 4.0  CL 94* 91* 98  CO2 19* 22 23  GLUCOSE 409* 315* 101*  BUN 103* 108* 66*  CREATININE 11.91* 12.88* 10.36*  CALCIUM 7.7* 7.9* 7.9*   LFT Recent  Labs    07/20/20 0427  PROT 5.8*  ALBUMIN 3.0*  AST 46*  ALT 36  ALKPHOS 70  BILITOT 0.5   PT/INR Recent Labs    07/20/20 1206  LABPROT 14.2  INR 1.2   Hepatitis Panel No results for input(s): HEPBSAG, HCVAB, HEPAIGM, HEPBIGM in the last 72 hours. C-Diff No results for input(s): CDIFFTOX in the last 72 hours. No results for input(s): CDIFFPCR in the last 72 hours. Fecal Lactopherrin No results for input(s): FECLLACTOFRN in the last 72 hours.  Studies/Results: DG Chest Port 1 View  Result Date: 07/22/2020 CLINICAL DATA:  Intubation.  Respiratory failure. EXAM: PORTABLE CHEST 1 VIEW COMPARISON:  07/20/2020. FINDINGS: Endotracheal tube and NG tube in stable position. Stable cardiomegaly. Interim significant resolution of bilateral pulmonary interstitial infiltrates/edema. Findings suggest improving CHF. Mild residual interstitial prominence noted. No prominent pleural effusion or pneumothorax. Right costophrenic angle incompletely imaged. Degenerative change thoracic spine. IMPRESSION: 1. Endotracheal tube and NG tube in stable position. 2. Stable cardiomegaly. Interim significant resolution of bilateral pulmonary interstitial infiltrates/edema. Mild residual interstitial prominence noted. Findings most consistent with resolving CHF. Electronically Signed   By: Marcello Moores  Register   On: 07/22/2020 06:35   DG Chest Portable 1 View  Result Date: 07/20/2020 CLINICAL DATA:  55 year old male status post intubation. EXAM: PORTABLE CHEST 1 VIEW COMPARISON:  Earlier chest radiograph dated 07/20/2020. FINDINGS: Endotracheal tube with tip approximately 6.5 cm above the carina. Enteric  tube extends below the diaphragm with tip beyond the inferior margin of the image. Cardiomegaly with vascular congestion and edema. Superimposed pneumonia is not excluded clinical correlation is recommended. Overall interval progression of pulmonary opacities compared to the earlier radiograph. No pleural effusion  pneumothorax. No acute osseous pathology. IMPRESSION: 1. Endotracheal tube above the carina. 2. Cardiomegaly with findings of CHF. Superimposed pneumonia is not excluded. Clinical correlation is recommended. Electronically Signed   By: Anner Crete M.D.   On: 07/20/2020 20:10   ECHOCARDIOGRAM COMPLETE  Result Date: 07/21/2020    ECHOCARDIOGRAM REPORT   Patient Name:   Jeremiah [name] Date of Exam: 07/21/2020 Medical Rec #:  [id number]         Height:       71.0 in Accession #:    [id number]        Weight:       185.0 lb Date of Birth:  September 06, 1965        BSA:          2.040 m Patient Age:    55 years          BP:           127/49 mmHg Patient Gender: M                 HR:           62 bpm. Exam Location:  Inpatient Procedure: 2D Echo, 3D Echo, Color Doppler and Cardiac Doppler Indications:    Cardiac Arrest i46.9  History:        Patient has prior history of Echocardiogram examinations, most                 recent 01/20/2018. Risk Factors:Hypertension, Diabetes,                 Dyslipidemia and COVID+ 05/2019.  Sonographer:    Raquel Sarna Senior RDCS Referring Phys: [id number] [place]  1. Left ventricular ejection fraction, by estimation, is 25 to 30%. The left ventricle has severely decreased function. The left ventricle demonstrates global hypokinesis. There is severe left ventricular hypertrophy. Left ventricular diastolic parameters are consistent with Grade I diastolic dysfunction (impaired relaxation). Elevated left atrial pressure.  2. Right ventricular systolic function is mildly reduced. The right ventricular size is normal. There is normal pulmonary artery systolic pressure.  3. Left atrial size was mildly dilated.  4. Moderate pleural effusion in the left lateral region.  5. The mitral valve is normal in structure. No evidence of mitral valve regurgitation. No evidence of mitral stenosis.  6. The aortic valve is tricuspid. Aortic valve regurgitation is not visualized. Mild aortic valve  sclerosis is present, with no evidence of aortic valve stenosis.  7. The inferior vena cava is dilated in size with >50% respiratory variability, suggesting right atrial pressure of 8 mmHg. FINDINGS  Left Ventricle: Left ventricular ejection fraction, by estimation, is 25 to 30%. The left ventricle has severely decreased function. The left ventricle demonstrates global hypokinesis. The left ventricular internal cavity size was normal in size. There is severe left ventricular hypertrophy. Left ventricular diastolic parameters are consistent with Grade I diastolic dysfunction (impaired relaxation). Elevated left atrial pressure. Right Ventricle: The right ventricular size is normal.Right ventricular systolic function is mildly reduced. There is normal pulmonary artery systolic pressure. The tricuspid regurgitant velocity is 1.95 m/s, and with an assumed right atrial pressure of 15 mmHg, the estimated right ventricular systolic pressure is 86.7 mmHg. Left Atrium: Left atrial  size was mildly dilated. Right Atrium: Right atrial size was normal in size. Pericardium: There is no evidence of pericardial effusion. Mitral Valve: The mitral valve is normal in structure. No evidence of mitral valve regurgitation. No evidence of mitral valve stenosis. Tricuspid Valve: The tricuspid valve is normal in structure. Tricuspid valve regurgitation is trivial. No evidence of tricuspid stenosis. Aortic Valve: The aortic valve is tricuspid. Aortic valve regurgitation is not visualized. Mild aortic valve sclerosis is present, with no evidence of aortic valve stenosis. Pulmonic Valve: The pulmonic valve was normal in structure. Pulmonic valve regurgitation is not visualized. No evidence of pulmonic stenosis. Aorta: The aortic root is normal in size and structure. Venous: The inferior vena cava is dilated in size with greater than 50% respiratory variability, suggesting right atrial pressure of 8 mmHg. IAS/Shunts: No atrial level shunt  detected by color flow Doppler. Additional Comments: There is a moderate pleural effusion in the left lateral region.  LEFT VENTRICLE PLAX 2D LVIDd:         5.30 cm  Diastology LVIDs:         4.10 cm  LV e' medial:    4.79 cm/s LV PW:         1.50 cm  LV E/e' medial:  16.8 LV IVS:        1.00 cm  LV e' lateral:   3.92 cm/s LVOT diam:     2.10 cm  LV E/e' lateral: 20.6 LV SV:         64 LV SV Index:   31 LVOT Area:     3.46 cm  RIGHT VENTRICLE RV S prime:     9.03 cm/s TAPSE (M-mode): 2.0 cm LEFT ATRIUM             Index       RIGHT ATRIUM           Index LA diam:        4.30 cm 2.11 cm/m  RA Area:     13.40 cm LA Vol (A2C):   75.2 ml 36.84 ml/m RA Volume:   32.60 ml  15.98 ml/m LA Vol (A4C):   78.4 ml 38.44 ml/m LA Biplane Vol: 78.7 ml 38.58 ml/m  AORTIC VALVE LVOT Vmax:   85.50 cm/s LVOT Vmean:  62.400 cm/s LVOT VTI:    0.185 m  AORTA Ao Asc diam: 3.40 cm MITRAL VALVE               TRICUSPID VALVE MV Area (PHT): 1.89 cm    TR Peak grad:   15.2 mmHg MV Decel Time: 401 msec    TR Vmax:        195.00 cm/s MV E velocity: 80.60 cm/s MV A velocity: 87.40 cm/s  SHUNTS MV E/A ratio:  0.92        Systemic VTI:  0.18 m                            Systemic Diam: 2.10 cm Kirk Ruths MD Electronically signed by Kirk Ruths MD Signature Date/Time: 07/21/2020/1:48:43 PM    Final    Medications: I have reviewed the patient's current medications.  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 His diet will be advanced to renal diet today. 2) S/P PEA/cardiac arrest likely vasovagal compounded by  severe anemia. 3) Acute hypoxic respiratory failure postarrest/metabolic encephalopathy. 4) ESRD/HTN/metabolic bone disease. 5) Poorly controlled diabetes. 6): Noncompliance.   LOS: 2 days   Jeremiah [name] 07/22/2020, 2:56 PM

## 2020-07-22 NOTE — Procedures (Signed)
Extubation Procedure Note  Patient Details:   Name: Jeremiah [name] DOB: August 01, 1965 MRN: [id number]   Airway Documentation:    Vent end date: 07/22/20 Vent end time: 1220   Evaluation  O2 sats: stable throughout Complications: No apparent complications Patient did tolerate procedure well. Bilateral Breath Sounds: Clear   Yes   Patient extubated to 2L nasal cannula per MD order.  Positive cuff leak noted.  No evidence of stridor.  Patient able to speak post extubation.  Sats currenlty 100%.  Vitals are stable.  Will continue to monitor.   Judith Part 07/22/2020, 12:21 PM

## 2020-07-22 NOTE — Progress Notes (Signed)
Inpatient Diabetes Program Recommendations  AACE/ADA: New Consensus Statement on Inpatient Glycemic Control (2015)  Target Ranges:  Prepandial:   less than 140 mg/dL      Peak postprandial:   less than 180 mg/dL (1-2 hours)      Critically ill patients:  140 - 180 mg/dL   Lab Results  Component Value Date   GLUCAP 100 (H) 07/22/2020   HGBA1C 10.7 (H) 07/04/2020    Review of Glycemic Control  Events noted on 1/25. CBGs 27-315 mg/dL on 1/25. CBGs 100-102 mg/dL thus far today   Inpatient Diabetes Program Recommendations:     Decrease Lantus to 6 units QD Novolog 0-6 units Q4H.  Follow closely.  Thank you. Lorenda Peck, RD, LDN, CDE Inpatient Diabetes Coordinator [phone]

## 2020-07-22 NOTE — Progress Notes (Signed)
[facility] Progress Note   Subjective:   Seen in ICU, on HD this am.   Objective Vitals:   07/22/20 1330 07/22/20 1345 07/22/20 1400 07/22/20 1500  BP: (!) 148/56 (!) 152/56 (!) 151/57   Pulse: 85 88 88   Resp:      Temp:    98.5 F (36.9 C)  TempSrc:    Oral  SpO2: 99% 98% 99%   Weight:      Height:       Physical Exam General: Well developed male, sedated and intubated Heart: RRR, no murmurs, rubs or gallops Lungs: Intubated. CTA anteriorly, no rales auscultated Abdomen: Soft, non-distended, +BS Extremities: No edema b/l lower extremities Dialysis Access:  AVF accessed  Additional Objective Labs: Basic Metabolic Panel: Recent Labs  Lab 07/20/20 2231 07/21/20 0558 07/22/20 0634  NA 134* 133* 137  K 5.3* 4.7 4.0  CL 94* 91* 98  CO2 19* 22 23  GLUCOSE 409* 315* 101*  BUN 103* 108* 66*  CREATININE 11.91* 12.88* 10.36*  CALCIUM 7.7* 7.9* 7.9*   Liver Function Tests: Recent Labs  Lab 07/20/20 0427  AST 46*  ALT 36  ALKPHOS 70  BILITOT 0.5  PROT 5.8*  ALBUMIN 3.0*   No results for input(s): LIPASE, AMYLASE in the last 168 hours. CBC: Recent Labs  Lab 07/20/20 0427 07/20/20 1206 07/20/20 1837 07/20/20 2222 07/21/20 0558 07/21/20 1723 07/22/20 0634  WBC 5.7  --  6.4  --  5.6 5.4 4.7  HGB 5.2*   < > 6.1*   < > 7.2* 8.5* 8.7*  HCT 16.6*   < > 19.9*   < > 20.9* 26.3* 26.7*  MCV 80.2  --  81.6  --  80.4 81.4 81.9  PLT 278  --  307  --  269 273 264   < > = values in this interval not displayed.   Blood Culture    Component Value Date/Time   SDES BLOOD LEFT ANTECUBITAL 06/17/2019 2011   SPECREQUEST  06/17/2019 2011    BOTTLES DRAWN AEROBIC AND ANAEROBIC Blood Culture adequate volume   CULT  06/17/2019 2011    NO GROWTH 5 DAYS Performed at [facility], [place] [address], [place], [place] [address]    REPTSTATUS 06/22/2019 FINAL 06/17/2019 2011    Cardiac Enzymes: No results for input(s): CKTOTAL, CKMB, CKMBINDEX, TROPONINI  in the last 168 hours. CBG: Recent Labs  Lab 07/21/20 1953 07/21/20 2047 07/21/20 2337 07/22/20 0333 07/22/20 0748  GLUCAP 45* 119* 96 102* 100*   Iron Studies: No results for input(s): IRON, TIBC, TRANSFERRIN, FERRITIN in the last 72 hours. @lablastinr3 @ Studies/Results: DG Chest Port 1 View  Result Date: 07/22/2020 CLINICAL DATA:  Intubation.  Respiratory failure. EXAM: PORTABLE CHEST 1 VIEW COMPARISON:  07/20/2020. FINDINGS: Endotracheal tube and NG tube in stable position. Stable cardiomegaly. Interim significant resolution of bilateral pulmonary interstitial infiltrates/edema. Findings suggest improving CHF. Mild residual interstitial prominence noted. No prominent pleural effusion or pneumothorax. Right costophrenic angle incompletely imaged. Degenerative change thoracic spine. IMPRESSION: 1. Endotracheal tube and NG tube in stable position. 2. Stable cardiomegaly. Interim significant resolution of bilateral pulmonary interstitial infiltrates/edema. Mild residual interstitial prominence noted. Findings most consistent with resolving CHF. Electronically Signed   By: Marcello Moores  Register   On: 07/22/2020 06:35   DG Chest Portable 1 View  Result Date: 07/20/2020 CLINICAL DATA:  55 year old male status post intubation. EXAM: PORTABLE CHEST 1 VIEW COMPARISON:  Earlier chest radiograph dated 07/20/2020. FINDINGS: Endotracheal tube with  tip approximately 6.5 cm above the carina. Enteric tube extends below the diaphragm with tip beyond the inferior margin of the image. Cardiomegaly with vascular congestion and edema. Superimposed pneumonia is not excluded clinical correlation is recommended. Overall interval progression of pulmonary opacities compared to the earlier radiograph. No pleural effusion pneumothorax. No acute osseous pathology. IMPRESSION: 1. Endotracheal tube above the carina. 2. Cardiomegaly with findings of CHF. Superimposed pneumonia is not excluded. Clinical correlation is recommended.  Electronically Signed   By: Anner Crete M.D.   On: 07/20/2020 20:10   ECHOCARDIOGRAM COMPLETE  Result Date: 07/21/2020    ECHOCARDIOGRAM REPORT   Patient Name:   Jeremiah [name] Date of Exam: 07/21/2020 Medical Rec #:  [id number]         Height:       71.0 in Accession #:    [id number]        Weight:       185.0 lb Date of Birth:  1966-06-08        BSA:          2.040 m Patient Age:    55 years          BP:           127/49 mmHg Patient Gender: M                 HR:           62 bpm. Exam Location:  Inpatient Procedure: 2D Echo, 3D Echo, Color Doppler and Cardiac Doppler Indications:    Cardiac Arrest i46.9  History:        Patient has prior history of Echocardiogram examinations, most                 recent 01/20/2018. Risk Factors:Hypertension, Diabetes,                 Dyslipidemia and COVID+ 05/2019.  Sonographer:    Raquel Sarna Senior RDCS Referring Phys: [id number] [place]  1. Left ventricular ejection fraction, by estimation, is 25 to 30%. The left ventricle has severely decreased function. The left ventricle demonstrates global hypokinesis. There is severe left ventricular hypertrophy. Left ventricular diastolic parameters are consistent with Grade I diastolic dysfunction (impaired relaxation). Elevated left atrial pressure.  2. Right ventricular systolic function is mildly reduced. The right ventricular size is normal. There is normal pulmonary artery systolic pressure.  3. Left atrial size was mildly dilated.  4. Moderate pleural effusion in the left lateral region.  5. The mitral valve is normal in structure. No evidence of mitral valve regurgitation. No evidence of mitral stenosis.  6. The aortic valve is tricuspid. Aortic valve regurgitation is not visualized. Mild aortic valve sclerosis is present, with no evidence of aortic valve stenosis.  7. The inferior vena cava is dilated in size with >50% respiratory variability, suggesting right atrial pressure of 8 mmHg. FINDINGS  Left  Ventricle: Left ventricular ejection fraction, by estimation, is 25 to 30%. The left ventricle has severely decreased function. The left ventricle demonstrates global hypokinesis. The left ventricular internal cavity size was normal in size. There is severe left ventricular hypertrophy. Left ventricular diastolic parameters are consistent with Grade I diastolic dysfunction (impaired relaxation). Elevated left atrial pressure. Right Ventricle: The right ventricular size is normal.Right ventricular systolic function is mildly reduced. There is normal pulmonary artery systolic pressure. The tricuspid regurgitant velocity is 1.95 m/s, and with an assumed right atrial pressure of 15 mmHg, the estimated right ventricular systolic  pressure is 30.2 mmHg. Left Atrium: Left atrial size was mildly dilated. Right Atrium: Right atrial size was normal in size. Pericardium: There is no evidence of pericardial effusion. Mitral Valve: The mitral valve is normal in structure. No evidence of mitral valve regurgitation. No evidence of mitral valve stenosis. Tricuspid Valve: The tricuspid valve is normal in structure. Tricuspid valve regurgitation is trivial. No evidence of tricuspid stenosis. Aortic Valve: The aortic valve is tricuspid. Aortic valve regurgitation is not visualized. Mild aortic valve sclerosis is present, with no evidence of aortic valve stenosis. Pulmonic Valve: The pulmonic valve was normal in structure. Pulmonic valve regurgitation is not visualized. No evidence of pulmonic stenosis. Aorta: The aortic root is normal in size and structure. Venous: The inferior vena cava is dilated in size with greater than 50% respiratory variability, suggesting right atrial pressure of 8 mmHg. IAS/Shunts: No atrial level shunt detected by color flow Doppler. Additional Comments: There is a moderate pleural effusion in the left lateral region.  LEFT VENTRICLE PLAX 2D LVIDd:         5.30 cm  Diastology LVIDs:         4.10 cm  LV e'  medial:    4.79 cm/s LV PW:         1.50 cm  LV E/e' medial:  16.8 LV IVS:        1.00 cm  LV e' lateral:   3.92 cm/s LVOT diam:     2.10 cm  LV E/e' lateral: 20.6 LV SV:         64 LV SV Index:   31 LVOT Area:     3.46 cm  RIGHT VENTRICLE RV S prime:     9.03 cm/s TAPSE (M-mode): 2.0 cm LEFT ATRIUM             Index       RIGHT ATRIUM           Index LA diam:        4.30 cm 2.11 cm/m  RA Area:     13.40 cm LA Vol (A2C):   75.2 ml 36.84 ml/m RA Volume:   32.60 ml  15.98 ml/m LA Vol (A4C):   78.4 ml 38.44 ml/m LA Biplane Vol: 78.7 ml 38.58 ml/m  AORTIC VALVE LVOT Vmax:   85.50 cm/s LVOT Vmean:  62.400 cm/s LVOT VTI:    0.185 m  AORTA Ao Asc diam: 3.40 cm MITRAL VALVE               TRICUSPID VALVE MV Area (PHT): 1.89 cm    TR Peak grad:   15.2 mmHg MV Decel Time: 401 msec    TR Vmax:        195.00 cm/s MV E velocity: 80.60 cm/s MV A velocity: 87.40 cm/s  SHUNTS MV E/A ratio:  0.92        Systemic VTI:  0.18 m                            Systemic Diam: 2.10 cm Kirk Ruths MD Electronically signed by Kirk Ruths MD Signature Date/Time: 07/21/2020/1:48:43 PM    Final    Medications:  . amLODipine  10 mg Oral Daily  . carvedilol  25 mg Oral QHS  . Chlorhexidine Gluconate Cloth  6 each Topical Q0600  . ferric citrate  210 mg Oral TID WC  . heparin sodium (porcine)      . hydrALAZINE  100 mg Oral TID  . insulin aspart  0-6 Units Subcutaneous Q4H  . insulin glargine  8 Units Subcutaneous Daily  . multivitamin  1 tablet Oral TID WC  . pantoprazole (PROTONIX) IV  40 mg Intravenous Q12H    Dialysis Orders: Center: [facility] on MWF. 180NRe, 4 hours, BFR 450 DFR 800 EDW 75kg 2K/2.25Ca, AVF 15g, heparin 4000 unit bolus Mircera 225 mcg IV q 2 weeks- last dose 07/13/20 Venofer 50mg  weekly Hectorol 4 mcg IVP q HD   CXR - no edema , +vasc congestion, 1/26  Assessment/Plan: 1.  Cardiac arrest: PEA arrest in the ED on 07/20/20. ?Vasovagal exacerbated by anemia. Now intubated and  sedated in ICU. K+ was not significantly elevated. Management Per critical care team.  2. Acute on chronic anemia:  With melena, recent endoscopy and colonoscopy on 07/10/20. Received PRBC on admission. Plan per GI/admitting team. Will hold heparin with HD. Not due for ESA dose yet.  3.  ESRD:  Dialyzes MWF, last HD Friday. HD rolled over to this AM due to high census/staffing shortage. Pt had HD here yest and today, 3L and 2.5 L off respectively . If wt's are right, is still 7 kg up.  4.  Hypertension/volume: Chronic volume overload. Significant vol overload by weight, vasc congestion on today's CXR. Cont to lower vol w/ next HD Friday.  5.  Metabolic bone disease: Calcium slightly low, no phos reported yet.Will use higher Ca bath with HD. Continue hectorol and resume binders once eating.  6.  Nutrition:  Currently NPO 7. T2DM: Poorly controlled (BS in 400's at dialysis on Friday) and it seems patient is a bit confused about his insulin regimen. Insulin per admitting team.   Kelly Splinter, MD 07/22/2020, 3:59 PM

## 2020-07-22 NOTE — Progress Notes (Signed)
NAME:  Jeremiah [name], MRN:  [id number], DOB:  [birth year], LOS: 2 ADMISSION DATE:  07/20/2020, CONSULTATION DATE:  07/20/20 REFERRING MD:  Reita May, CHIEF COMPLAINT:  Melena, cardiac arrest in ED  Brief History:  55 yo man with hx of melena, came to ED for episode of melena, weakness, and fatigue.  Found to be anemic 5.2.  Developed bradycardia leading to PEA arrest in ED.   History of Present Illness:   55 yoM here with melena x 2 days.  Weakness and fatigue.   Recent admission this month for melena, endoscopy revealed erosive  gastropathy.   Intital Hb 5.2 up to 6.1 after 1 unit pRBC While in ED, received 1 unit of blood with appropriating increased hb.   Patient ate a little, then started vomiting.  After emesis, said he didn't feel good, felt like he had low blood sugar.  HR became bradycardic, lost consciousness, lost pulses.  Brief cardiac arrest, ROSC after epi x 1.  Intubated for apnea.   Now waking up, trying to pull out tube  Past Medical History:  Melena (admitted 07/10/20) - gastropathy CKD on HD DMII, diabetic nephropathy HTN OA  Takes norvasc, asa, hydral 100 TID, insulin 10-20 u glargine Significant Hospital Events:  PEA cardiac arrest 1/24  Consults:  GI (previously seen by Dr. Benson Norway)  Procedures:  1/24 ETT >> 1/25 iHD  Significant Diagnostic Tests:  CXR  IMPRESSION: 1. Endotracheal tube above the carina. 2. Cardiomegaly with findings of CHF. Superimposed pneumonia is not excluded. Clinical correlation is recommended.  EKG sinus.    7/2019echo  Study Conclusions  - Left ventricle: The cavity size was normal. There was severe  concentric hypertrophy. Systolic function was vigorous. The  estimated ejection fraction was in the range of 65% to 70%. Wall  motion was normal; there were no regional wall motion  abnormalities. Doppler parameters are consistent with abnormal  left ventricular relaxation (grade 1 diastolic dysfunction). The   E/e&' ratio is between 8-15, suggesting indeterminate LV filling  pressure.  Impressions: - Compared to a prior study in 2016, the LVEF is higher at 65-70%  wtih severe LV wall thicknening. GIven the appearance of the  myocardium, would consider possible work-up for infiltrative  cardioymopathy or consider HCM.   Recent colonoscopy 07/10/20: Normal esophagus.                           - Erosive gastropathy with stigmata of recent                            bleeding. Biopsied.                           - Normal examined duodenum.  Path: FINAL MICROSCOPIC DIAGNOSIS:   A. STOMACH, FUNDUS, BIOPSY:  - Gastric mucosa with hyperemia and focal acute inflammation.  - Warthin-Starry negative for Helicobacter pylori.  - No intestinal metaplasia, dysplasia or carcinoma.   B. COLON, HEPATIC FLEXURE, POLYPECTOMY:  - Colonic mucosa with benign lymphoid aggregate.  - No adenomatous change or carcinoma.   1/26 EGD>> - Normal esophagus. - Gastritis. - Non-bleeding duodenal ulcer with no stigmata of bleeding.  Micro Data:  1/24 SARS >> neg 1/25 MRSA PCR >>   Antimicrobials:  n/a  Interim History / Subjective:  iHD (3L off) and EGD yesterday- showing gastritis, non bleeding duodenal ulcer Some hypoglycemia  yesterday, stable overnight No events overnight or further bleeding H/H stable  Currently on low dose precedex, off fentanyl, starting SBT  Plans for iHD this am   Objective   Blood pressure (!) 166/62, pulse 64, temperature (!) 97.3 F (36.3 C), temperature source Oral, resp. rate 18, height 5\' 11"  (1.803 m), weight 82.3 kg, SpO2 100 %.    Vent Mode: PRVC FiO2 (%):  [40 %] 40 % Set Rate:  [18 bmp] 18 bmp Vt Set:  [600 mL] 600 mL PEEP:  [5 cmH20] 5 cmH20 Plateau Pressure:  [18 cmH20-19 cmH20] 18 cmH20   Intake/Output Summary (Last 24 hours) at 07/22/2020 0810 Last data filed at 07/22/2020 0600 Gross per 24 hour  Intake 173.03 ml  Output 3850 ml  Net -3676.97 ml    Filed Weights   07/20/20 1150 07/21/20 0300 07/22/20 0409  Weight: 79.8 kg 83.9 kg 82.3 kg   Examination: General:  Adult male sitting upright in bed in NAD HEENT: MM pink/moist, ETT/ OGT, pupils 4/reactive Neuro: Awake, f/c, MAE CV:rr, NSR, no murmur PULM:  Non labored, CTA, minimal secretions GI: soft, bs active, NT/ ND  Extremities: warm/dry, no LE edema  Skin: no rashes    Afebrile/ normal WBC Labs reviewed CXR- reviewed, ETT/ OGT stable, significant improvement in pulmonary edema from prior [facility]  Resolved Hospital Problem list     Assessment & Plan:  Cardiac arrest: unclear cause. Vasovagal initially? Exacerbated by anemia? EKG non-acute showing prolonged QTc  - no clear cause this far - continue supportive care    Hypoxic respiratory failure  Pulmonary edema - resolved - hopeful for extubation this morning  - will need SLP/ PT/ OT post extubation  - d/c fentanyl gtt, continue low dose precedex- minimize as able  - intermittent CXR   Encephalopathy after cardiac arrest - following commands today - trend neuro exams  HTN - continue home norvasc 10 mg daily, coreg 25mg  daily, hydralazine 100mg  TID - iHD for volume removal    Melena - recent EGD and colonoscopy by Dr. Benson Norway on 07/10/2020 c/w erosive gastropathy and one polyp at the hepatic flexure - EGD repeated 1/25 c/w with gastritis and non bleeding duodenal ulcer - Appreciate GI recommendations  - continue PPI gtt per GI recs  - continue sucralfate   ESRD AGMA- improving  - per nephrology - iHD 1/25 (3L) removed, plans for iHD again today  - trend renal indices - bladder scan q shift, still makes some urine    DMII with hyperglycemia f/b hypoglycemia on 1/25 - decrease SSI to ESRD - decrease Lantus 8 units daily    Anemia of chronic disease -  Hgb stable/ trend, no further signs of bleeding    Prolonged QTc - avoid QTc prolonging meds - telemetry/ monitor   Best practice (evaluated daily)   Diet: remains NPO Pain/Anxiety/Delirium protocol (if indicated): propofol/fentanyl  VAP protocol (if indicated): yes DVT prophylaxis: SCD GI prophylaxis: Protonix gtt  Glucose control: SSI/ lantus Mobility: Bed  Disposition:ICU  Goals of Care:  Last date of multidisciplinary goals of care discussion Family and staff present:  Summary of discussion:  Follow up goals of care discussion due:  Code Status: Full code.  Pending family update 1/25.   Labs   CBC: Recent Labs  Lab 07/20/20 0427 07/20/20 1206 07/20/20 1837 07/20/20 2222 07/20/20 2231 07/21/20 0558 07/21/20 1723 07/22/20 0634  WBC 5.7  --  6.4  --   --  5.6 5.4 4.7  HGB 5.2*   < >  6.1* 8.2* 7.1* 7.2* 8.5* 8.7*  HCT 16.6*   < > 19.9* 24.0* 21.4* 20.9* 26.3* 26.7*  MCV 80.2  --  81.6  --   --  80.4 81.4 81.9  PLT 278  --  307  --   --  269 273 264   < > = values in this interval not displayed.    Basic Metabolic Panel: Recent Labs  Lab 07/20/20 0427 07/20/20 2222 07/20/20 2231 07/21/20 0558 07/22/20 0634  NA 134* 132* 134* 133* 137  K 4.3 5.1 5.3* 4.7 4.0  CL 93*  --  94* 91* 98  CO2 24  --  19* 22 23  GLUCOSE 342*  --  409* 315* 101*  BUN 92*  --  103* 108* 66*  CREATININE 11.27*  --  11.91* 12.88* 10.36*  CALCIUM 8.1*  --  7.7* 7.9* 7.9*   GFR: Estimated Creatinine Clearance: 8.6 mL/min (A) (by C-G formula based on SCr of 10.36 mg/dL (H)). Recent Labs  Lab 07/20/20 1837 07/21/20 0558 07/21/20 1723 07/22/20 0634  WBC 6.4 5.6 5.4 4.7    Liver Function Tests: Recent Labs  Lab 07/20/20 0427  AST 46*  ALT 36  ALKPHOS 70  BILITOT 0.5  PROT 5.8*  ALBUMIN 3.0*   No results for input(s): LIPASE, AMYLASE in the last 168 hours. No results for input(s): AMMONIA in the last 168 hours.  ABG    Component Value Date/Time   PHART 7.473 (H) 07/20/2020 2222   PCO2ART 31.7 (L) 07/20/2020 2222   PO2ART 103 07/20/2020 2222   HCO3 23.3 07/20/2020 2222   TCO2 24 07/20/2020 2222   O2SAT 98.0  07/20/2020 2222     Coagulation Profile: Recent Labs  Lab 07/20/20 1206  INR 1.2    Cardiac Enzymes: No results for input(s): CKTOTAL, CKMB, CKMBINDEX, TROPONINI in the last 168 hours.  HbA1C: HbA1c, POC (controlled diabetic range)  Date/Time Value Ref Range Status  09/03/2019 09:15 AM 10.1 (A) 0.0 - 7.0 % Final  12/04/2018 10:38 AM 9.0 (A) 0.0 - 7.0 % Final   Hgb A1c MFr Bld  Date/Time Value Ref Range Status  07/04/2020 12:52 PM 10.7 (H) 4.8 - 5.6 % Final    Comment:    (NOTE) Pre diabetes:          5.7%-6.4%  Diabetes:              >6.4%  Glycemic control for   <7.0% adults with diabetes   05/12/2020 04:59 AM 9.8 (H) 4.8 - 5.6 % Final    Comment:    (NOTE) Pre diabetes:          5.7%-6.4%  Diabetes:              >6.4%  Glycemic control for   <7.0% adults with diabetes     CBG: Recent Labs  Lab 07/21/20 1606 07/21/20 1953 07/21/20 2047 07/21/20 2337 07/22/20 0333  GLUCAP 95 45* 119* 96 102*    Critical care time: 30 minutes     Kennieth Rad, ACNP [facility] 07/22/2020, 8:10 AM

## 2020-07-23 ENCOUNTER — Encounter (HOSPITAL_COMMUNITY): Payer: Self-pay | Admitting: Gastroenterology

## 2020-07-23 DIAGNOSIS — K922 Gastrointestinal hemorrhage, unspecified: Secondary | ICD-10-CM | POA: Diagnosis not present

## 2020-07-23 LAB — BASIC METABOLIC PANEL
Anion gap: 12 (ref 5–15)
BUN: 42 mg/dL — ABNORMAL HIGH (ref 6–20)
CO2: 24 mmol/L (ref 22–32)
Calcium: 7.6 mg/dL — ABNORMAL LOW (ref 8.9–10.3)
Chloride: 97 mmol/L — ABNORMAL LOW (ref 98–111)
Creatinine, Ser: 8.83 mg/dL — ABNORMAL HIGH (ref 0.61–1.24)
GFR, Estimated: 7 mL/min — ABNORMAL LOW (ref >60)
Glucose, Bld: 324 mg/dL — ABNORMAL HIGH (ref 70–99)
Potassium: 4.2 mmol/L (ref 3.5–5.1)
Sodium: 133 mmol/L — ABNORMAL LOW (ref 135–145)

## 2020-07-23 LAB — CBC
HCT: 25.6 % — ABNORMAL LOW (ref 39.0–52.0)
Hemoglobin: 8.1 g/dL — ABNORMAL LOW (ref 13.0–17.0)
MCH: 26.3 pg (ref 26.0–34.0)
MCHC: 31.6 g/dL (ref 30.0–36.0)
MCV: 83.1 fL (ref 80.0–100.0)
Platelets: 254 10*3/uL (ref 150–400)
RBC: 3.08 MIL/uL — ABNORMAL LOW (ref 4.22–5.81)
RDW: 20.4 % — ABNORMAL HIGH (ref 11.5–15.5)
WBC: 6.8 10*3/uL (ref 4.0–10.5)
nRBC: 0.4 % — ABNORMAL HIGH (ref 0.0–0.2)

## 2020-07-23 LAB — GLUCOSE, CAPILLARY
Glucose-Capillary: 201 mg/dL — ABNORMAL HIGH (ref 70–99)
Glucose-Capillary: 206 mg/dL — ABNORMAL HIGH (ref 70–99)
Glucose-Capillary: 217 mg/dL — ABNORMAL HIGH (ref 70–99)
Glucose-Capillary: 262 mg/dL — ABNORMAL HIGH (ref 70–99)
Glucose-Capillary: 265 mg/dL — ABNORMAL HIGH (ref 70–99)
Glucose-Capillary: 291 mg/dL — ABNORMAL HIGH (ref 70–99)

## 2020-07-23 LAB — HEMOGLOBIN AND HEMATOCRIT, BLOOD
HCT: 24.3 % — ABNORMAL LOW (ref 39.0–52.0)
Hemoglobin: 7.9 g/dL — ABNORMAL LOW (ref 13.0–17.0)

## 2020-07-23 MED ORDER — SODIUM CHLORIDE 0.9 % IV SOLN: INTRAVENOUS | Status: DC

## 2020-07-23 MED ORDER — AMLODIPINE BESYLATE 5 MG PO TABS
5.0000 mg | ORAL_TABLET | Freq: Two times a day (BID) | ORAL | Status: DC
  Administered 2020-07-23 – 2020-07-25 (×4): 5 mg via ORAL
  Filled 2020-07-23 (×4): qty 1

## 2020-07-23 MED ORDER — INSULIN ASPART 100 UNIT/ML ~~LOC~~ SOLN
0.0000 [IU] | Freq: Three times a day (TID) | SUBCUTANEOUS | Status: DC
  Administered 2020-07-24: 7 [IU] via SUBCUTANEOUS
  Administered 2020-07-25 (×2): 5 [IU] via SUBCUTANEOUS

## 2020-07-23 NOTE — Progress Notes (Signed)
Subjective: Feeling well.  No complaints.  No reports of any further GI bleeding.  Objective: Vital signs in last 24 hours: Temp:  [97 F (36.1 C)-100 F (37.8 C)] 98.9 F (37.2 C) (01/27 0640) Pulse Rate:  [67-94] 94 (01/27 0700) Resp:  [9-29] 19 (01/27 0700) BP: (78-175)/(43-100) 155/70 (01/27 0700) SpO2:  [98 %-100 %] 100 % (01/27 0700) Weight:  [80.7 kg-84.7 kg] 80.7 kg (01/27 0448)    Intake/Output from previous day: 01/26 0701 - 01/27 0700 In: 553.1 [I.V.:553.1] Out: 2821 [Urine:250] Intake/Output this shift: No intake/output data recorded.  General appearance: alert and no distress Resp: clear to auscultation bilaterally Cardio: regular rate and rhythm GI: soft, non-tender; bowel sounds normal; no masses,  no organomegaly Extremities: extremities normal, atraumatic, no cyanosis or edema  Lab Results: Recent Labs    07/21/20 1723 07/22/20 0634 07/23/20 0033  WBC 5.4 4.7 6.8  HGB 8.5* 8.7* 8.1*  HCT 26.3* 26.7* 25.6*  PLT 273 264 254   BMET Recent Labs    07/21/20 0558 07/22/20 0634 07/23/20 0033  NA 133* 137 133*  K 4.7 4.0 4.2  CL 91* 98 97*  CO2 22 23 24   GLUCOSE 315* 101* 324*  BUN 108* 66* 42*  CREATININE 12.88* 10.36* 8.83*  CALCIUM 7.9* 7.9* 7.6*   LFT No results for input(s): PROT, ALBUMIN, AST, ALT, ALKPHOS, BILITOT, BILIDIR, IBILI in the last 72 hours. PT/INR Recent Labs    07/20/20 1206  LABPROT 14.2  INR 1.2   Hepatitis Panel No results for input(s): HEPBSAG, HCVAB, HEPAIGM, HEPBIGM in the last 72 hours. C-Diff No results for input(s): CDIFFTOX in the last 72 hours. Fecal Lactopherrin No results for input(s): FECLLACTOFRN in the last 72 hours.  Studies/Results: DG Chest Port 1 View  Result Date: 07/22/2020 CLINICAL DATA:  Intubation.  Respiratory failure. EXAM: PORTABLE CHEST 1 VIEW COMPARISON:  07/20/2020. FINDINGS: Endotracheal tube and NG tube in stable position. Stable cardiomegaly. Interim significant resolution of  bilateral pulmonary interstitial infiltrates/edema. Findings suggest improving CHF. Mild residual interstitial prominence noted. No prominent pleural effusion or pneumothorax. Right costophrenic angle incompletely imaged. Degenerative change thoracic spine. IMPRESSION: 1. Endotracheal tube and NG tube in stable position. 2. Stable cardiomegaly. Interim significant resolution of bilateral pulmonary interstitial infiltrates/edema. Mild residual interstitial prominence noted. Findings most consistent with resolving CHF. Electronically Signed   By: Marcello Moores  Register   On: 07/22/2020 06:35   ECHOCARDIOGRAM COMPLETE  Result Date: 07/21/2020    ECHOCARDIOGRAM REPORT   Patient Name:   Jeremiah [name] Date of Exam: 07/21/2020 Medical Rec #:  [id number]         Height:       71.0 in Accession #:    [id number]        Weight:       185.0 lb Date of Birth:  July 28, 1965        BSA:          2.040 m Patient Age:    55 years          BP:           127/49 mmHg Patient Gender: M                 HR:           62 bpm. Exam Location:  Inpatient Procedure: 2D Echo, 3D Echo, Color Doppler and Cardiac Doppler Indications:    Cardiac Arrest i46.9  History:        Patient has prior history  of Echocardiogram examinations, most                 recent 01/20/2018. Risk Factors:Hypertension, Diabetes,                 Dyslipidemia and COVID+ 05/2019.  Sonographer:    Raquel Sarna Senior RDCS Referring Phys: [id number] [place]  1. Left ventricular ejection fraction, by estimation, is 25 to 30%. The left ventricle has severely decreased function. The left ventricle demonstrates global hypokinesis. There is severe left ventricular hypertrophy. Left ventricular diastolic parameters are consistent with Grade I diastolic dysfunction (impaired relaxation). Elevated left atrial pressure.  2. Right ventricular systolic function is mildly reduced. The right ventricular size is normal. There is normal pulmonary artery systolic pressure.  3. Left  atrial size was mildly dilated.  4. Moderate pleural effusion in the left lateral region.  5. The mitral valve is normal in structure. No evidence of mitral valve regurgitation. No evidence of mitral stenosis.  6. The aortic valve is tricuspid. Aortic valve regurgitation is not visualized. Mild aortic valve sclerosis is present, with no evidence of aortic valve stenosis.  7. The inferior vena cava is dilated in size with >50% respiratory variability, suggesting right atrial pressure of 8 mmHg. FINDINGS  Left Ventricle: Left ventricular ejection fraction, by estimation, is 25 to 30%. The left ventricle has severely decreased function. The left ventricle demonstrates global hypokinesis. The left ventricular internal cavity size was normal in size. There is severe left ventricular hypertrophy. Left ventricular diastolic parameters are consistent with Grade I diastolic dysfunction (impaired relaxation). Elevated left atrial pressure. Right Ventricle: The right ventricular size is normal.Right ventricular systolic function is mildly reduced. There is normal pulmonary artery systolic pressure. The tricuspid regurgitant velocity is 1.95 m/s, and with an assumed right atrial pressure of 15 mmHg, the estimated right ventricular systolic pressure is 33.2 mmHg. Left Atrium: Left atrial size was mildly dilated. Right Atrium: Right atrial size was normal in size. Pericardium: There is no evidence of pericardial effusion. Mitral Valve: The mitral valve is normal in structure. No evidence of mitral valve regurgitation. No evidence of mitral valve stenosis. Tricuspid Valve: The tricuspid valve is normal in structure. Tricuspid valve regurgitation is trivial. No evidence of tricuspid stenosis. Aortic Valve: The aortic valve is tricuspid. Aortic valve regurgitation is not visualized. Mild aortic valve sclerosis is present, with no evidence of aortic valve stenosis. Pulmonic Valve: The pulmonic valve was normal in structure. Pulmonic  valve regurgitation is not visualized. No evidence of pulmonic stenosis. Aorta: The aortic root is normal in size and structure. Venous: The inferior vena cava is dilated in size with greater than 50% respiratory variability, suggesting right atrial pressure of 8 mmHg. IAS/Shunts: No atrial level shunt detected by color flow Doppler. Additional Comments: There is a moderate pleural effusion in the left lateral region.  LEFT VENTRICLE PLAX 2D LVIDd:         5.30 cm  Diastology LVIDs:         4.10 cm  LV e' medial:    4.79 cm/s LV PW:         1.50 cm  LV E/e' medial:  16.8 LV IVS:        1.00 cm  LV e' lateral:   3.92 cm/s LVOT diam:     2.10 cm  LV E/e' lateral: 20.6 LV SV:         64 LV SV Index:   31 LVOT Area:  3.46 cm  RIGHT VENTRICLE RV S prime:     9.03 cm/s TAPSE (M-mode): 2.0 cm LEFT ATRIUM             Index       RIGHT ATRIUM           Index LA diam:        4.30 cm 2.11 cm/m  RA Area:     13.40 cm LA Vol (A2C):   75.2 ml 36.84 ml/m RA Volume:   32.60 ml  15.98 ml/m LA Vol (A4C):   78.4 ml 38.44 ml/m LA Biplane Vol: 78.7 ml 38.58 ml/m  AORTIC VALVE LVOT Vmax:   85.50 cm/s LVOT Vmean:  62.400 cm/s LVOT VTI:    0.185 m  AORTA Ao Asc diam: 3.40 cm MITRAL VALVE               TRICUSPID VALVE MV Area (PHT): 1.89 cm    TR Peak grad:   15.2 mmHg MV Decel Time: 401 msec    TR Vmax:        195.00 cm/s MV E velocity: 80.60 cm/s MV A velocity: 87.40 cm/s  SHUNTS MV E/A ratio:  0.92        Systemic VTI:  0.18 m                            Systemic Diam: 2.10 cm Kirk Ruths MD Electronically signed by Kirk Ruths MD Signature Date/Time: 07/21/2020/1:48:43 PM    Final     Medications:  Scheduled: . amLODipine  10 mg Oral Daily  . carvedilol  25 mg Oral QHS  . Chlorhexidine Gluconate Cloth  6 each Topical Q0600  . ferric citrate  210 mg Oral TID WC  . hydrALAZINE  100 mg Oral TID  . insulin aspart  0-6 Units Subcutaneous Q4H  . insulin glargine  8 Units Subcutaneous Daily  . multivitamin  1 tablet  Oral TID WC  . pantoprazole (PROTONIX) IV  40 mg Intravenous Q12H   Continuous:   Assessment/Plan: 1) Melena. 2) Anemia. 3) ESRD.   The patient is clinically stable.  His HGB is relatively stable in the 8 g/dL range.  A VCE will be performed, but it will be performed tomorrow AM.  He ate breakfast just now and it will preclude visualization with the capsule endo.  Plan: 1) VCE in the AM tomorrow. 2) Monitor HGB and transfuse as necessary. 3) NPO after midnight.  LOS: 3 days   Maximilien Hayashi D 07/23/2020, 7:28 AM

## 2020-07-23 NOTE — Progress Notes (Signed)
[facility] Progress Note   Subjective:   Seen in ICU, extubated and up in the chair. UF 3 L again wed on HD.    Objective Vitals:   07/23/20 1200 07/23/20 1213 07/23/20 1300 07/23/20 1327  BP: (!) 163/85  135/72 (!) 153/86  Pulse: 98  96   Resp: 15  19 18   Temp:  98.8 F (37.1 C)  98.7 F (37.1 C)  TempSrc:  Oral  Oral  SpO2: 99%  98% 100%  Weight:      Height:       Physical Exam General: Well developed male, nad, alert Heart: RRR, no murmurs, rubs or gallops Lungs: CTA anteriorly, no rales auscultated Abdomen: Soft, non-distended, +BS Extremities: No edema b/l lower extremities Dialysis Access:  AVF +bruit  Additional Objective Labs: Basic Metabolic Panel: Recent Labs  Lab 07/21/20 0558 07/22/20 0634 07/23/20 0033  NA 133* 137 133*  K 4.7 4.0 4.2  CL 91* 98 97*  CO2 22 23 24   GLUCOSE 315* 101* 324*  BUN 108* 66* 42*  CREATININE 12.88* 10.36* 8.83*  CALCIUM 7.9* 7.9* 7.6*   Liver Function Tests: Recent Labs  Lab 07/20/20 0427  AST 46*  ALT 36  ALKPHOS 70  BILITOT 0.5  PROT 5.8*  ALBUMIN 3.0*   No results for input(s): LIPASE, AMYLASE in the last 168 hours. CBC: Recent Labs  Lab 07/20/20 1837 07/20/20 2222 07/21/20 0558 07/21/20 1723 07/22/20 0634 07/23/20 0033  WBC 6.4  --  5.6 5.4 4.7 6.8  HGB 6.1*   < > 7.2* 8.5* 8.7* 8.1*  HCT 19.9*   < > 20.9* 26.3* 26.7* 25.6*  MCV 81.6  --  80.4 81.4 81.9 83.1  PLT 307  --  269 273 264 254   < > = values in this interval not displayed.   Blood Culture    Component Value Date/Time   SDES BLOOD LEFT ANTECUBITAL 06/17/2019 2011   SPECREQUEST  06/17/2019 2011    BOTTLES DRAWN AEROBIC AND ANAEROBIC Blood Culture adequate volume   CULT  06/17/2019 2011    NO GROWTH 5 DAYS Performed at [facility], [place], [place] [address], [place], [place] [address]    REPTSTATUS 06/22/2019 FINAL 06/17/2019 2011    Cardiac Enzymes: No results for input(s): CKTOTAL, CKMB, CKMBINDEX, TROPONINI in the  last 168 hours. CBG: Recent Labs  Lab 07/22/20 2008 07/23/20 0000 07/23/20 0423 07/23/20 0643 07/23/20 1211  GLUCAP 279* 291* 265* 201* 262*   Iron Studies: No results for input(s): IRON, TIBC, TRANSFERRIN, FERRITIN in the last 72 hours. @lablastinr3 @ Studies/Results: DG Chest Port 1 View  Result Date: 07/22/2020 CLINICAL DATA:  Intubation.  Respiratory failure. EXAM: PORTABLE CHEST 1 VIEW COMPARISON:  07/20/2020. FINDINGS: Endotracheal tube and NG tube in stable position. Stable cardiomegaly. Interim significant resolution of bilateral pulmonary interstitial infiltrates/edema. Findings suggest improving CHF. Mild residual interstitial prominence noted. No prominent pleural effusion or pneumothorax. Right costophrenic angle incompletely imaged. Degenerative change thoracic spine. IMPRESSION: 1. Endotracheal tube and NG tube in stable position. 2. Stable cardiomegaly. Interim significant resolution of bilateral pulmonary interstitial infiltrates/edema. Mild residual interstitial prominence noted. Findings most consistent with resolving CHF. Electronically Signed   By: Marcello Moores  Register   On: 07/22/2020 06:35   Medications:  . amLODipine  10 mg Oral Daily  . carvedilol  25 mg Oral QHS  . Chlorhexidine Gluconate Cloth  6 each Topical Q0600  . ferric citrate  210 mg Oral TID WC  . hydrALAZINE  100  mg Oral TID  . insulin aspart  0-6 Units Subcutaneous Q4H  . insulin glargine  8 Units Subcutaneous Daily  . multivitamin  1 tablet Oral TID WC  . pantoprazole (PROTONIX) IV  40 mg Intravenous Q12H    OP HD:  [place] MWF   4h  450/800  75kg  2/2.25 bath  AVF 15 ga  Hep 4000  Mircera 225 mcg IV q 2 weeks- last dose 07/13/20, due on 1/31 Venofer 50mg  weekly Hectorol 4 mcg IVP q HD   CXR - no edema , +vasc congestion, 1/26  Assessment/Plan: 1.  Cardiac arrest: PEA arrest in the ED on 07/20/20. ?Vasovagal exacerbated by anemia. Now intubated and sedated in ICU. K+ was not significantly elevated.  Extubated now.  2. Recurrent GIB : seen by GI, EGD done in ICU 1/25 showed GU and DU w/o active bleeding. On PPI Rx.  3. Acute on chronic anemia:  With melena, sp recent endoscopy and colonoscopy on 07/10/20. Received PRBC on admission. Hb 6's on admit > up to low 8's now. Plan per GI/admitting team. Hold heparin with HD for another few days. Due for esa on 1/31.  4.  ESRD:  [facility] MWF, last HD Friday. Had HD here Tues and Wed, next HD tomorrow off schedule. NO heparin for 1-2 wks w/ GIB.  5.  Hypertension/volume: Chronic volume overload. Significant vol overload by weight, vasc congestion on admit CXR. Cont to lower vol w/ HD tomorrow.  6.  Metabolic bone disease: Calcium slightly low, no phos reported yet.Will use higher Ca bath with HD. Continue hectorol and resume binders once eating.  7.  Nutrition:  Currently NPO 8. T2DM: improved.   Kelly Splinter, MD 07/23/2020, 2:19 PM

## 2020-07-23 NOTE — Progress Notes (Signed)
PROGRESS NOTE    Jeremiah [name]  HQI:[id number]  DOB: [birth year]  DOA: 07/20/2020 PCP: Maximiano Coss, NP Outpatient Specialists:   Hospital course:  55 year old male with end-stage renal disease on hemodialysis who presented with melena, acute blood loss anemia, complicated with PEA cardiac arrest.  Patient was extubated 07/22/2020 and transferred to care of [facility].   Subjective:  Patient states that he feels good.  He has no complaints.  He does not know if he still bleeding.  Denies any confusion.   Objective: Vitals:   07/23/20 1213 07/23/20 1300 07/23/20 1327 07/23/20 1703  BP:  135/72 (!) 153/86 (!) 148/84  Pulse:  96  96  Resp:  19 18 19   Temp: 98.8 F (37.1 C)  98.7 F (37.1 C) 98.2 F (36.8 C)  TempSrc: Oral  Oral Oral  SpO2:  98% 100% 100%  Weight:      Height:        Intake/Output Summary (Last 24 hours) at 07/23/2020 1847 Last data filed at 07/23/2020 1200 Gross per 24 hour  Intake 310.89 ml  Output 250 ml  Net 60.89 ml   Filed Weights   07/22/20 0845 07/22/20 1145 07/23/20 0448  Weight: 84.7 kg 82.1 kg 80.7 kg     Exam:  General: Remarkably well-appearing man sitting up in bed talking to his wife on the phone in no acute distress. Eyes: sclera anicteric, conjuctiva mild injection bilaterally CVS: S1-S2, regular  Respiratory:  decreased air entry bilaterally secondary to decreased inspiratory effort, rales at bases  GI: NABS, soft, NT  LE: No edema.  Neuro: A/O x 3, Moving all extremities equally with normal strength, CN 3-12 intact, grossly nonfocal.  Psych: patient is logical and coherent, judgement and insight appear normal, mood and affect appropriate to situation.   Assessment & Plan:   GI bleed Patient underwent EGD yesterday which showed gastric and duodenal ulcer but no active bleeding. Continue IV Protonix twice daily H&H relatively stable, will transfuse if hemoglobin less than 8  ESRD HD per  nephrology  DM2 Blood sugars in the mid to high 200s on very sensitive SSI Change to sensitive SSI and follow Continue glargine at 8 units  HTN Blood pressure under reasonable control on home doses of amlodipine, carvedilol and oral hydralazine  Status post PEA arrest likely vasovagal versus prolonged QT Patient is doing well after extubation with no confusion Will repeat EKG   DVT prophylaxis: SCD Code Status: Full Family Communication: Spoke with wife on the phone Disposition Plan:   Patient is from: Home  Anticipated Discharge Location: Home  Barriers to Discharge: Following CBC  Is patient medically stable for Discharge: Not yet   Consultants:  GI  Nephrology  Procedures:  Hemodialysis  Antimicrobials:  None   Data Reviewed:  Basic Metabolic Panel: Recent Labs  Lab 07/20/20 0427 07/20/20 2222 07/20/20 2231 07/21/20 0558 07/22/20 0634 07/23/20 0033  NA 134* 132* 134* 133* 137 133*  K 4.3 5.1 5.3* 4.7 4.0 4.2  CL 93*  --  94* 91* 98 97*  CO2 24  --  19* 22 23 24   GLUCOSE 342*  --  409* 315* 101* 324*  BUN 92*  --  103* 108* 66* 42*  CREATININE 11.27*  --  11.91* 12.88* 10.36* 8.83*  CALCIUM 8.1*  --  7.7* 7.9* 7.9* 7.6*   Liver Function Tests: Recent Labs  Lab 07/20/20 0427  AST 46*  ALT 36  ALKPHOS 70  BILITOT 0.5  PROT  5.8*  ALBUMIN 3.0*   No results for input(s): LIPASE, AMYLASE in the last 168 hours. No results for input(s): AMMONIA in the last 168 hours. CBC: Recent Labs  Lab 07/20/20 1837 07/20/20 2222 07/20/20 2231 07/21/20 0558 07/21/20 1723 07/22/20 0634 07/23/20 0033  WBC 6.4  --   --  5.6 5.4 4.7 6.8  HGB 6.1*   < > 7.1* 7.2* 8.5* 8.7* 8.1*  HCT 19.9*   < > 21.4* 20.9* 26.3* 26.7* 25.6*  MCV 81.6  --   --  80.4 81.4 81.9 83.1  PLT 307  --   --  269 273 264 254   < > = values in this interval not displayed.   Cardiac Enzymes: No results for input(s): CKTOTAL, CKMB, CKMBINDEX, TROPONINI in the last 168 hours. BNP  (last 3 results) No results for input(s): PROBNP in the last 8760 hours. CBG: Recent Labs  Lab 07/23/20 0000 07/23/20 0423 07/23/20 0643 07/23/20 1211 07/23/20 1540  GLUCAP 291* 265* 201* 262* 217*    Recent Results (from the past 240 hour(s))  SARS Coronavirus 2 by RT PCR (hospital order, performed in [facility] hospital lab) Nasopharyngeal Nasopharyngeal Swab     Status: None   Collection Time: 07/20/20 12:31 PM   Specimen: Nasopharyngeal Swab  Result Value Ref Range Status   SARS Coronavirus 2 NEGATIVE NEGATIVE Final    Comment: (NOTE) SARS-CoV-2 target nucleic acids are NOT DETECTED.  The SARS-CoV-2 RNA is generally detectable in upper and lower respiratory specimens during the acute phase of infection. The lowest concentration of SARS-CoV-2 viral copies this assay can detect is 250 copies / mL. A negative result does not preclude SARS-CoV-2 infection and should not be used as the sole basis for treatment or other patient management decisions.  A negative result may occur with improper specimen collection / handling, submission of specimen other than nasopharyngeal swab, presence of viral mutation(s) within the areas targeted by this assay, and inadequate number of viral copies (<250 copies / mL). A negative result must be combined with clinical observations, patient history, and epidemiological information.  Fact Sheet for Patients:   StrictlyIdeas.no  Fact Sheet for Healthcare Providers: BankingDealers.co.za  This test is not yet approved or  cleared by the [place] FDA and has been authorized for detection and/or diagnosis of SARS-CoV-2 by FDA under an Emergency Use Authorization (EUA).  This EUA will remain in effect (meaning this test can be used) for the duration of the COVID-19 declaration under Section 564(b)(1) of the Act, 21 U.S.C. section 360bbb-3(b)(1), unless the authorization is terminated or revoked  sooner.  Performed at [facility], [place] [address] [place], [place] [address]   MRSA PCR Screening     Status: None   Collection Time: 07/21/20  9:23 AM   Specimen: Nasal Mucosa; Nasopharyngeal  Result Value Ref Range Status   MRSA by PCR NEGATIVE NEGATIVE Final    Comment:        The GeneXpert MRSA Assay (FDA approved for NASAL specimens only), is one component of a comprehensive MRSA colonization surveillance program. It is not intended to diagnose MRSA infection nor to guide or monitor treatment for MRSA infections. Performed at [facility], [place] [address], [place], [place] [address]       Studies: DG Chest Port 1 View  Result Date: 07/22/2020 CLINICAL DATA:  Intubation.  Respiratory failure. EXAM: PORTABLE CHEST 1 VIEW COMPARISON:  07/20/2020. FINDINGS: Endotracheal tube and NG tube in stable position.  Stable cardiomegaly. Interim significant resolution of bilateral pulmonary interstitial infiltrates/edema. Findings suggest improving CHF. Mild residual interstitial prominence noted. No prominent pleural effusion or pneumothorax. Right costophrenic angle incompletely imaged. Degenerative change thoracic spine. IMPRESSION: 1. Endotracheal tube and NG tube in stable position. 2. Stable cardiomegaly. Interim significant resolution of bilateral pulmonary interstitial infiltrates/edema. Mild residual interstitial prominence noted. Findings most consistent with resolving CHF. Electronically Signed   By: Marcello Moores  Register   On: 07/22/2020 06:35     Scheduled Meds: . amLODipine  5 mg Oral BID  . carvedilol  25 mg Oral QHS  . Chlorhexidine Gluconate Cloth  6 each Topical Q0600  . ferric citrate  210 mg Oral TID WC  . hydrALAZINE  100 mg Oral TID  . insulin aspart  0-6 Units Subcutaneous Q4H  . insulin glargine  8 Units Subcutaneous Daily  . multivitamin  1 tablet Oral TID WC  . pantoprazole (PROTONIX) IV  40 mg Intravenous Q12H   Continuous Infusions: . sodium  chloride      Principal Problem:   Acute upper GI bleeding Active Problems:   Poorly controlled type 2 diabetes mellitus ([place])   Hypertension   HLD (hyperlipidemia)   ESRD (end stage renal disease) (HCC)   ABLA (acute blood loss anemia)   Cardiac arrest ([place])     Steffan Caniglia Derek Jack, [facility]  If 7PM-7AM, please contact night-coverage [email or web address] Password [facility] 07/23/2020, 6:47 PM    LOS: 3 days

## 2020-07-24 ENCOUNTER — Encounter (HOSPITAL_COMMUNITY)
Admission: EM | Disposition: A | Discharge status: Home / Self Care | Payer: Self-pay | Source: Home / Self Care | Attending: Internal Medicine

## 2020-07-24 DIAGNOSIS — K922 Gastrointestinal hemorrhage, unspecified: Secondary | ICD-10-CM | POA: Diagnosis not present

## 2020-07-24 HISTORY — PX: GIVENS CAPSULE STUDY: SHX5432

## 2020-07-24 LAB — BASIC METABOLIC PANEL
Anion gap: 14 (ref 5–15)
BUN: 59 mg/dL — ABNORMAL HIGH (ref 6–20)
CO2: 22 mmol/L (ref 22–32)
Calcium: 7.2 mg/dL — ABNORMAL LOW (ref 8.9–10.3)
Chloride: 95 mmol/L — ABNORMAL LOW (ref 98–111)
Creatinine, Ser: 11.48 mg/dL — ABNORMAL HIGH (ref 0.61–1.24)
GFR, Estimated: 5 mL/min — ABNORMAL LOW (ref >60)
Glucose, Bld: 390 mg/dL — ABNORMAL HIGH (ref 70–99)
Potassium: 5.1 mmol/L (ref 3.5–5.1)
Sodium: 131 mmol/L — ABNORMAL LOW (ref 135–145)

## 2020-07-24 LAB — GLUCOSE, CAPILLARY
Glucose-Capillary: 143 mg/dL — ABNORMAL HIGH (ref 70–99)
Glucose-Capillary: 175 mg/dL — ABNORMAL HIGH (ref 70–99)
Glucose-Capillary: 23 mg/dL — CL (ref 70–99)
Glucose-Capillary: 319 mg/dL — ABNORMAL HIGH (ref 70–99)
Glucose-Capillary: 63 mg/dL — ABNORMAL LOW (ref 70–99)
Glucose-Capillary: 68 mg/dL — ABNORMAL LOW (ref 70–99)
Glucose-Capillary: 88 mg/dL (ref 70–99)
Glucose-Capillary: 94 mg/dL (ref 70–99)

## 2020-07-24 LAB — BPAM RBC
Blood Product Expiration Date: 202202082359
Blood Product Expiration Date: 202202192359
Blood Product Expiration Date: 202202242359
Blood Product Expiration Date: 202202242359
Blood Product Expiration Date: 202202252359
ISSUE DATE / TIME: 202201241320
ISSUE DATE / TIME: 202201241951
ISSUE DATE / TIME: 202201241951
Unit Type and Rh: 5100
Unit Type and Rh: 5100
Unit Type and Rh: 5100
Unit Type and Rh: 5100
Unit Type and Rh: 5100

## 2020-07-24 LAB — CBC
HCT: 24.5 % — ABNORMAL LOW (ref 39.0–52.0)
Hemoglobin: 7.8 g/dL — ABNORMAL LOW (ref 13.0–17.0)
MCH: 26.2 pg (ref 26.0–34.0)
MCHC: 31.8 g/dL (ref 30.0–36.0)
MCV: 82.2 fL (ref 80.0–100.0)
Platelets: 230 10*3/uL (ref 150–400)
RBC: 2.98 MIL/uL — ABNORMAL LOW (ref 4.22–5.81)
RDW: 19.7 % — ABNORMAL HIGH (ref 11.5–15.5)
WBC: 5.4 10*3/uL (ref 4.0–10.5)
nRBC: 0.6 % — ABNORMAL HIGH (ref 0.0–0.2)

## 2020-07-24 LAB — TYPE AND SCREEN
ABO/RH(D): O POS
Antibody Screen: NEGATIVE
Unit division: 0
Unit division: 0
Unit division: 0
Unit division: 0
Unit division: 0

## 2020-07-24 SURGERY — IMAGING PROCEDURE, GI TRACT, INTRALUMINAL, VIA CAPSULE
Anesthesia: LOCAL

## 2020-07-24 MED ORDER — INSULIN GLARGINE 100 UNIT/ML ~~LOC~~ SOLN
12.0000 [IU] | Freq: Every day | SUBCUTANEOUS | Status: DC
  Filled 2020-07-24: qty 0.12

## 2020-07-24 MED ORDER — INSULIN GLARGINE 100 UNIT/ML ~~LOC~~ SOLN
4.0000 [IU] | SUBCUTANEOUS | Status: AC
  Administered 2020-07-24: 4 [IU] via SUBCUTANEOUS
  Filled 2020-07-24: qty 0.04

## 2020-07-24 SURGICAL SUPPLY — 1 items: TOWEL COTTON PACK 4EA (MISCELLANEOUS) ×4 IMPLANT

## 2020-07-24 NOTE — Progress Notes (Signed)
Inpatient Diabetes Program Recommendations  AACE/ADA: New Consensus Statement on Inpatient Glycemic Control (2015)  Target Ranges:  Prepandial:   less than 140 mg/dL      Peak postprandial:   less than 180 mg/dL (1-2 hours)      Critically ill patients:  140 - 180 mg/dL   Lab Results  Component Value Date   GLUCAP 88 07/24/2020   HGBA1C 10.7 (H) 07/04/2020    Review of Glycemic Control Results for TABITHA, TUPPER (MRN [id number]) as of 07/24/2020 12:41  Ref. Range 07/23/2020 12:11 07/23/2020 15:40 07/23/2020 21:28 07/24/2020 06:26 07/24/2020 12:29  Glucose-Capillary Latest Ref Range: 70 - 99 mg/dL 262 (H) 217 (H) 206 (H) 319 (H) 88   Diabetes history: DM2 Outpatient Diabetes medications: Lantus 10-20 units QAM (10 units CBG< 300 and 20 units if CBG> 300) Current orders for Inpatient glycemic control: Lantus 12 units daily, Novolog sensitive tid with meals Inpatient Diabetes Program Recommendations:   Agree with current orders. Referral received.  Will follow.   Thanks  Adah Perl, RN, BC-ADM Inpatient Diabetes Coordinator Pager [phone] (8a-5p)

## 2020-07-24 NOTE — Progress Notes (Signed)
Pt swallowed givens capsule at 1000 with no problems. Pt educated and verbalized understanding.

## 2020-07-24 NOTE — Plan of Care (Signed)
  Problem: Clinical Measurements: Goal: Will remain free from infection Outcome: Progressing   Problem: Health Behavior/Discharge Planning: Goal: Ability to manage health-related needs will improve Outcome: Not Progressing   

## 2020-07-24 NOTE — Progress Notes (Signed)
Patients blood sugar was 88 mg/dL at 1229. Patient stated he felt his blood sugar dropping further. Blood sugar at 1350 was 63 mg/dL. Provided patient milk and graham crackers. Will check blood sugar again in 15 minutes.  Patient had capsule study started at 1000 and is now able to consume a snack at 1400.   Daymon Larsen, RN

## 2020-07-24 NOTE — Progress Notes (Signed)
Subjective: No acute events.  Objective: Vital signs in last 24 hours: Temp:  [98 F (36.7 C)-98.9 F (37.2 C)] 98.6 F (37 C) (01/28 1227) Pulse Rate:  [76-96] 78 (01/28 1227) Resp:  [17-20] 17 (01/28 1227) BP: (130-155)/(68-88) 146/68 (01/28 1227) SpO2:  [93 %-100 %] 93 % (01/28 1227) Weight:  [79.9 kg] 79.9 kg (01/28 0951) Last BM Date: 07/23/20  Intake/Output from previous day: 01/27 0701 - 01/28 0700 In: 434.1 [P.O.:360; I.V.:74.1] Out: 150 [Urine:150] Intake/Output this shift: Total I/O In: 120 [P.O.:120] Out: 100 [Urine:100]  General appearance: sleeping soundly  Lab Results: Recent Labs    07/22/20 0634 07/23/20 0033 07/23/20 1914 07/24/20 0212  WBC 4.7 6.8  --  5.4  HGB 8.7* 8.1* 7.9* 7.8*  HCT 26.7* 25.6* 24.3* 24.5*  PLT 264 254  --  230   BMET Recent Labs    07/22/20 0634 07/23/20 0033 07/24/20 0212  NA 137 133* 131*  K 4.0 4.2 5.1  CL 98 97* 95*  CO2 23 24 22   GLUCOSE 101* 324* 390*  BUN 66* 42* 59*  CREATININE 10.36* 8.83* 11.48*  CALCIUM 7.9* 7.6* 7.2*   LFT No results for input(s): PROT, ALBUMIN, AST, ALT, ALKPHOS, BILITOT, BILIDIR, IBILI in the last 72 hours. PT/INR No results for input(s): LABPROT, INR in the last 72 hours. Hepatitis Panel No results for input(s): HEPBSAG, HCVAB, HEPAIGM, HEPBIGM in the last 72 hours. C-Diff No results for input(s): CDIFFTOX in the last 72 hours. Fecal Lactopherrin No results for input(s): FECLLACTOFRN in the last 72 hours.  Studies/Results: No results found.  Medications:  Scheduled: . amLODipine  5 mg Oral BID  . carvedilol  25 mg Oral QHS  . Chlorhexidine Gluconate Cloth  6 each Topical Q0600  . ferric citrate  210 mg Oral TID WC  . hydrALAZINE  100 mg Oral TID  . insulin aspart  0-9 Units Subcutaneous TID WC  . [START ON 07/25/2020] insulin glargine  12 Units Subcutaneous Daily  . multivitamin  1 tablet Oral TID WC  . pantoprazole (PROTONIX) IV  40 mg Intravenous Q12H    Continuous:   Assessment/Plan: 1) Melena. 2) Anemia. 3) S/p PEA arrest. 4) ESRD.   His HGB is stable.  He is currently undergoing the VCE.  The capsule will be read tomorrow in the late morning to the afternoon.  LOS: 4 days   Marilyn Nihiser D 07/24/2020, 1:26 PM

## 2020-07-24 NOTE — Progress Notes (Signed)
PROGRESS NOTE    Jeremiah [name]  JSE:[id number]  DOB: 1966-06-22  DOA: 07/20/2020 PCP: Maximiano Coss, NP Outpatient Specialists:   Hospital course:  55 year old male with end-stage renal disease on hemodialysis who presented with melena, acute blood loss anemia, complicated with PEA cardiac arrest.  Patient was extubated 07/22/2020 and transferred to care of [facility]ist.   Subjective:  Patient states " my blood sugar was low, just getting to eat something now".  Patient notes he feels tired and like he does when his blood sugar is low.   Objective: Vitals:   07/24/20 0741 07/24/20 0848 07/24/20 0951 07/24/20 1227  BP: (!) 155/78 130/85  (!) 146/68  Pulse: 76 76  78  Resp: 20 18  17   Temp: 98 F (36.7 C)   98.6 F (37 C)  TempSrc: Oral   Oral  SpO2: 93%   93%  Weight:   79.9 kg   Height:   5\' 11"  (1.803 m)     Intake/Output Summary (Last 24 hours) at 07/24/2020 1618 Last data filed at 07/24/2020 1233 Gross per 24 hour  Intake 314.11 ml  Output 250 ml  Net 64.11 ml   Filed Weights   07/23/20 0448 07/24/20 0500 07/24/20 0951  Weight: 80.7 kg 79.9 kg 79.9 kg     Exam:  General: Somewhat sleepy and groggy appearing man sitting up in bed eating apple juice and eating some graham crackers.   Eyes: sclera anicteric, conjuctiva mild injection bilaterally CVS: S1-S2, regular  Respiratory:  decreased air entry bilaterally secondary to decreased inspiratory effort, rales at bases  GI: NABS, soft, NT  LE: No edema.  Neuro: grossly nonfocal.  Psych: Patient is hyperglycemic clinically.   Assessment & Plan:    Hypoglycemia Secondary to being n.p.o. for part of the day for his capsule endoscopy initiated today Patient will be able to eat in a couple of hours. Continue to follow blood sugars and encourage oral sugar/juice consumption as needed.  GI bleed Patient underwent EGD yesterday which showed gastric and duodenal ulcer but no active  bleeding. Appreciate GI consultation, patient is undergoing capsule endoscopy today Continue IV Protonix twice daily H&H relatively stable, will transfuse if hemoglobin less than 8  ESRD HD per nephrology  DM2 As noted above blood sugars have been low due to patient being n.p.o. Increase glargine to 12 units tomorrow per recommendations of diabetes coordinator. Continue SSI sensitive scale  HTN Blood pressure under reasonable control on home doses of amlodipine, carvedilol and oral hydralazine  Status post PEA arrest  Patient is doing well after extubation with no confusion EKG ordered for tomorrow morning to check QTC.   DVT prophylaxis: SCD Code Status: Full Family Communication: Spoke with wife on the phone yesterday Disposition Plan:   Patient is from: Home  Anticipated Discharge Location: Home  Barriers to Discharge: Work-up of GI bleed   Is patient medically stable for Discharge: Not yet   Consultants:  GI  Nephrology  Procedures:  Hemodialysis  Antimicrobials:  None   Data Reviewed:  Basic Metabolic Panel: Recent Labs  Lab 07/20/20 2231 07/21/20 0558 07/22/20 0634 07/23/20 0033 07/24/20 0212  NA 134* 133* 137 133* 131*  K 5.3* 4.7 4.0 4.2 5.1  CL 94* 91* 98 97* 95*  CO2 19* 22 23 24 22   GLUCOSE 409* 315* 101* 324* 390*  BUN 103* 108* 66* 42* 59*  CREATININE 11.91* 12.88* 10.36* 8.83* 11.48*  CALCIUM 7.7* 7.9* 7.9* 7.6* 7.2*   Liver  Function Tests: Recent Labs  Lab 07/20/20 0427  AST 46*  ALT 36  ALKPHOS 70  BILITOT 0.5  PROT 5.8*  ALBUMIN 3.0*   No results for input(s): LIPASE, AMYLASE in the last 168 hours. No results for input(s): AMMONIA in the last 168 hours. CBC: Recent Labs  Lab 07/21/20 0558 07/21/20 1723 07/22/20 0634 07/23/20 0033 07/23/20 1914 07/24/20 0212  WBC 5.6 5.4 4.7 6.8  --  5.4  HGB 7.2* 8.5* 8.7* 8.1* 7.9* 7.8*  HCT 20.9* 26.3* 26.7* 25.6* 24.3* 24.5*  MCV 80.4 81.4 81.9 83.1  --  82.2  PLT 269 273  264 254  --  230   Cardiac Enzymes: No results for input(s): CKTOTAL, CKMB, CKMBINDEX, TROPONINI in the last 168 hours. BNP (last 3 results) No results for input(s): PROBNP in the last 8760 hours. CBG: Recent Labs  Lab 07/24/20 0626 07/24/20 1229 07/24/20 1350 07/24/20 1426 07/24/20 1454  GLUCAP 319* 88 63* 68* 94    Recent Results (from the past 240 hour(s))  SARS Coronavirus 2 by RT PCR (hospital order, performed in [facility] hospital lab) Nasopharyngeal Nasopharyngeal Swab     Status: None   Collection Time: 07/20/20 12:31 PM   Specimen: Nasopharyngeal Swab  Result Value Ref Range Status   SARS Coronavirus 2 NEGATIVE NEGATIVE Final    Comment: (NOTE) SARS-CoV-2 target nucleic acids are NOT DETECTED.  The SARS-CoV-2 RNA is generally detectable in upper and lower respiratory specimens during the acute phase of infection. The lowest concentration of SARS-CoV-2 viral copies this assay can detect is 250 copies / mL. A negative result does not preclude SARS-CoV-2 infection and should not be used as the sole basis for treatment or other patient management decisions.  A negative result may occur with improper specimen collection / handling, submission of specimen other than nasopharyngeal swab, presence of viral mutation(s) within the areas targeted by this assay, and inadequate number of viral copies (<250 copies / mL). A negative result must be combined with clinical observations, patient history, and epidemiological information.  Fact Sheet for Patients:   StrictlyIdeas.no  Fact Sheet for Healthcare Providers: BankingDealers.co.za  This test is not yet approved or  cleared by the [place] FDA and has been authorized for detection and/or diagnosis of SARS-CoV-2 by FDA under an Emergency Use Authorization (EUA).  This EUA will remain in effect (meaning this test can be used) for the duration of the COVID-19 declaration  under Section 564(b)(1) of the Act, 21 U.S.C. section 360bbb-3(b)(1), unless the authorization is terminated or revoked sooner.  Performed at [facility], [place] [address] [place], [place] [address]   MRSA PCR Screening     Status: None   Collection Time: 07/21/20  9:23 AM   Specimen: Nasal Mucosa; Nasopharyngeal  Result Value Ref Range Status   MRSA by PCR NEGATIVE NEGATIVE Final    Comment:        The GeneXpert MRSA Assay (FDA approved for NASAL specimens only), is one component of a comprehensive MRSA colonization surveillance program. It is not intended to diagnose MRSA infection nor to guide or monitor treatment for MRSA infections. Performed at [facility], [place] [address], [place], [place] [address]       Studies: No results found.   Scheduled Meds: . amLODipine  5 mg Oral BID  . carvedilol  25 mg Oral QHS  . Chlorhexidine Gluconate Cloth  6 each Topical Q0600  . ferric citrate  210 mg Oral TID  WC  . hydrALAZINE  100 mg Oral TID  . insulin aspart  0-9 Units Subcutaneous TID WC  . [START ON 07/25/2020] insulin glargine  12 Units Subcutaneous Daily  . multivitamin  1 tablet Oral TID WC  . pantoprazole (PROTONIX) IV  40 mg Intravenous Q12H   Continuous Infusions:   Principal Problem:   Acute upper GI bleeding Active Problems:   Poorly controlled type 2 diabetes mellitus ([place])   Hypertension   HLD (hyperlipidemia)   ESRD (end stage renal disease) (HCC)   ABLA (acute blood loss anemia)   Cardiac arrest ([place])     Constantinos Krempasky Derek Jack, [facility]  If 7PM-7AM, please contact night-coverage [email or web address] Password [email or web address] 07/24/2020, 4:18 PM    LOS: 4 days

## 2020-07-24 NOTE — Progress Notes (Signed)
[facility] Progress Note   Subjective: Seen in room. No C/Os.    Objective Vitals:   07/24/20 0353 07/24/20 0500 07/24/20 0741 07/24/20 0848  BP: (!) 154/81  (!) 155/78 130/85  Pulse: 90  76 76  Resp: 20  20 18   Temp: 98.2 F (36.8 C)  98 F (36.7 C)   TempSrc: Oral  Oral   SpO2: 100%  93%   Weight:  79.9 kg    Height:       Physical Exam General: WN,WD NAD HEENT: Periorbital edema present Heart: S1,S2 RRR SR on monitor.  Lungs: CTAB Abdomen: active BS Extremities: No LE edema Dialysis Access: AVF +T/B    Additional Objective Labs: Basic Metabolic Panel: Recent Labs  Lab 07/22/20 0634 07/23/20 0033 07/24/20 0212  NA 137 133* 131*  K 4.0 4.2 5.1  CL 98 97* 95*  CO2 23 24 22   GLUCOSE 101* 324* 390*  BUN 66* 42* 59*  CREATININE 10.36* 8.83* 11.48*  CALCIUM 7.9* 7.6* 7.2*   Liver Function Tests: Recent Labs  Lab 07/20/20 0427  AST 46*  ALT 36  ALKPHOS 70  BILITOT 0.5  PROT 5.8*  ALBUMIN 3.0*   No results for input(s): LIPASE, AMYLASE in the last 168 hours. CBC: Recent Labs  Lab 07/21/20 0558 07/21/20 1723 07/22/20 0634 07/23/20 0033 07/23/20 1914 07/24/20 0212  WBC 5.6 5.4 4.7 6.8  --  5.4  HGB 7.2* 8.5* 8.7* 8.1* 7.9* 7.8*  HCT 20.9* 26.3* 26.7* 25.6* 24.3* 24.5*  MCV 80.4 81.4 81.9 83.1  --  82.2  PLT 269 273 264 254  --  230   Blood Culture    Component Value Date/Time   SDES BLOOD LEFT ANTECUBITAL 06/17/2019 2011   SPECREQUEST  06/17/2019 2011    BOTTLES DRAWN AEROBIC AND ANAEROBIC Blood Culture adequate volume   CULT  06/17/2019 2011    NO GROWTH 5 DAYS Performed at [facility], [place] [address], [place], [place] [address]    REPTSTATUS 06/22/2019 FINAL 06/17/2019 2011    Cardiac Enzymes: No results for input(s): CKTOTAL, CKMB, CKMBINDEX, TROPONINI in the last 168 hours. CBG: Recent Labs  Lab 07/23/20 0643 07/23/20 1211 07/23/20 1540 07/23/20 2128 07/24/20 0626  GLUCAP 201* 262* 217* 206* 319*    Iron Studies: No results for input(s): IRON, TIBC, TRANSFERRIN, FERRITIN in the last 72 hours. @lablastinr3 @ Studies/Results: No results found. Medications: . sodium chloride 20 mL/hr at 07/23/20 2104   . amLODipine  5 mg Oral BID  . carvedilol  25 mg Oral QHS  . Chlorhexidine Gluconate Cloth  6 each Topical Q0600  . ferric citrate  210 mg Oral TID WC  . hydrALAZINE  100 mg Oral TID  . insulin aspart  0-9 Units Subcutaneous TID WC  . [START ON 07/25/2020] insulin glargine  12 Units Subcutaneous Daily  . insulin glargine  4 Units Subcutaneous NOW  . multivitamin  1 tablet Oral TID WC  . pantoprazole (PROTONIX) IV  40 mg Intravenous Q12H     OP HD:  [place] MWF   4h  450/800  75kg  2/2.25 bath  AVF 15 ga  Hep 4000  Mircera 225 mcg IV q 2 weeks- last dose 07/13/20, due on 1/31 Venofer 50mg  weekly Hectorol 4 mcg IVP q HD   CXR - no edema , +vasc congestion, 1/26  Assessment/Plan: 1. Cardiac arrest: PEA arrest in the ED on 07/20/20. ?Vasovagal exacerbated by anemia. Now extubated, transferred out of unit. 2. Recurrent  GIB : seen by GI, EGD done in ICU 1/25 showed GU and DU w/o active bleeding. On PPI Rx.  3. Acute on chronic anemia:With melena, sp recent endoscopy and colonoscopy on 07/10/20. Received PRBC on admission. Hb 6's on admit > up to low 8's now. Plan per GI/admitting team. Hold heparin with HD for another few days. Due for esa on 1/31. 4. ESRD:[facility] MWF, last HD Friday. Had HD here Tues and Wed, next HD today. NO heparin for 1-2 wks w/ GIB.  5. Hypertension/volume:Chronic volume overload. Significant vol overload by weight, vasc congestion on admit CXR. Cont to lower vol w/ HD tomorrow.  6. Metabolic bone disease:Calcium slightly low, no phos reported yet.Will use higher Ca bath with HD. Continue hectorol and resume binders once eating. 7. Nutrition:Currently NPO 8. T2DM:improved.   Rita H. Brown NP-C 07/24/2020, 9:54 AM  [employer] [phone]

## 2020-07-25 LAB — CBC
HCT: 26.6 % — ABNORMAL LOW (ref 39.0–52.0)
Hemoglobin: 8.5 g/dL — ABNORMAL LOW (ref 13.0–17.0)
MCH: 26.2 pg (ref 26.0–34.0)
MCHC: 32 g/dL (ref 30.0–36.0)
MCV: 81.8 fL (ref 80.0–100.0)
Platelets: 276 10*3/uL (ref 150–400)
RBC: 3.25 MIL/uL — ABNORMAL LOW (ref 4.22–5.81)
RDW: 19.1 % — ABNORMAL HIGH (ref 11.5–15.5)
WBC: 6.1 10*3/uL (ref 4.0–10.5)
nRBC: 0 % (ref 0.0–0.2)

## 2020-07-25 LAB — GLUCOSE, CAPILLARY
Glucose-Capillary: 408 mg/dL — ABNORMAL HIGH (ref 70–99)
Glucose-Capillary: 424 mg/dL — ABNORMAL HIGH (ref 70–99)
Glucose-Capillary: 266 mg/dL — ABNORMAL HIGH (ref 70–99)
Glucose-Capillary: 281 mg/dL — ABNORMAL HIGH (ref 70–99)

## 2020-07-25 LAB — BASIC METABOLIC PANEL
Anion gap: 13 (ref 5–15)
BUN: 31 mg/dL — ABNORMAL HIGH (ref 6–20)
CO2: 24 mmol/L (ref 22–32)
Calcium: 7.4 mg/dL — ABNORMAL LOW (ref 8.9–10.3)
Chloride: 94 mmol/L — ABNORMAL LOW (ref 98–111)
Creatinine, Ser: 7.81 mg/dL — ABNORMAL HIGH (ref 0.61–1.24)
GFR, Estimated: 8 mL/min — ABNORMAL LOW (ref >60)
Glucose, Bld: 337 mg/dL — ABNORMAL HIGH (ref 70–99)
Potassium: 4 mmol/L (ref 3.5–5.1)
Sodium: 131 mmol/L — ABNORMAL LOW (ref 135–145)

## 2020-07-25 LAB — GLUCOSE, RANDOM: Glucose, Bld: 432 mg/dL — ABNORMAL HIGH (ref 70–99)

## 2020-07-25 MED ORDER — INSULIN GLARGINE 100 UNIT/ML ~~LOC~~ SOLN
16.0000 [IU] | Freq: Every day | SUBCUTANEOUS | Status: DC
  Administered 2020-07-25: 16 [IU] via SUBCUTANEOUS
  Filled 2020-07-25: qty 0.16

## 2020-07-25 MED ORDER — INSULIN ASPART 100 UNIT/ML ~~LOC~~ SOLN
10.0000 [IU] | Freq: Once | SUBCUTANEOUS | Status: AC
  Administered 2020-07-25: 10 [IU] via SUBCUTANEOUS

## 2020-07-25 MED ORDER — PANTOPRAZOLE SODIUM 40 MG PO TBEC: 40.0000 mg | DELAYED_RELEASE_TABLET | Freq: Every day | ORAL | 1 refills | Status: DC

## 2020-07-25 NOTE — Discharge Summary (Signed)
Physician Discharge Summary  Jeremiah [name] IRW:[id number] DOB: 12/12/1965 DOA: 07/20/2020  PCP: Maximiano Coss, NP  Admit date: 07/20/2020 Discharge date: 07/25/2020  Admitted From: Home  Disposition:  Home   Recommendations for Outpatient Follow-up:  1. Follow up with PCP in 1-2 weeks 2. Recommends repeat CBC next 2 to 3 days and repeat metabolic panel in coming weeks. 3. Recommend follow-up with cardiology 4. Recommend follow-up with gastroenterology for acute blood loss anemia 5. Avoid QT prolonging medications    Home Health: None   Equipment/Devices:None  Discharge Condition Improved  CODE STATUS:Full  Diet recommendation: Renal   Brief/Interim Summary:   55 year old with history significant for end-stage renal disease on hemodialysis, difficult to control diabetes on insulin therapy and recent admission for anemia who presented with melena.  Patient presented to the emergency room on 24 January with melena and hemoglobin 5.4. Patient presented to the ER and was receiving blood when he began to vomit became bradycardic. Patient lost consciousness and went in to pulseless electrical activity. He achieved return of spontaneous circulation after 1 round of epinephrine. He was intubated at the time for apnea. After intubation he is awake and pulling at his endotracheal tube. He was evaluated in the ICU during which time he underwent EGD to explore the cause of his ongoing anemia. This revealed gastritis with a very small duodenal ulcer. Capsule endoscopy was performed which additionally did not search show any cause. The patient received a total of 3 units of packed red blood cells and his hemoglobin remained stable since 25 January. Ultimately he was easily extubated and weaned to room air. Hemoglobin remained stable and he underwent a capsule endoscopy which did not reveal a cause of his bleeding. He is to follow-up with gastroenterology for this as he has recently also had a  colonoscopy and endoscopy earlier in the month of January for the same concern. Consider hemolytic anemia work-up and abdominal CT to evaluate for cause of bleeding if anemia persist, suspect this is due to ESRD and GI blood loss given melena.   The cause of the patient's cardiac arrest is thought to be due to either severe anemia or vasovagal episode related to emesis. He did have a prolonged QT but this is improved at discharge. Given findings on echocardiogram recommend cardiology consultation at follow-up. Discussed with patient at length.  Patient achieved his dry weight prior to discharge which is 160 pounds. Nephrology was consulted throughout his hospital stay. He is also evaluated by physical therapy who recommended no further follow-up.  Discharge Diagnoses:  Principal Problem:   Acute upper GI bleeding Active Problems:   Poorly controlled type 2 diabetes mellitus (HCC)   Hypertension   HLD (hyperlipidemia)   ESRD (end stage renal disease) (HCC)   ABLA (acute blood loss anemia)   Cardiac arrest (HCC) Newly diagnosed heart failure with reduced ejection fraction, now 25% after cardiac arrest recommend repeat as I anticipate this will improve remained asymptomatic and without exacerbation at this  Discharge Instructions  Discharge Instructions    Ambulatory referral to Cardiology   Complete by: As directed    History of PEA due to acute blood loss Newly reduced HFrEF   Call MD for:   Complete by: As directed    Return to Emergency room for any bleeding   Call MD for:  difficulty breathing, headache or visual disturbances   Complete by: As directed    Call MD for:  extreme fatigue   Complete by:  As directed    Call MD for:  hives   Complete by: As directed    Call MD for:  persistant dizziness or light-headedness   Complete by: As directed    Call MD for:  persistant nausea and vomiting   Complete by: As directed    Call MD for:  redness, tenderness, or signs of infection  (pain, swelling, redness, odor or green/yellow discharge around incision site)   Complete by: As directed    Call MD for:  severe uncontrolled pain   Complete by: As directed    Call MD for:  temperature >100.4   Complete by: As directed    Diet - low sodium heart healthy   Complete by: As directed    Increase activity slowly   Complete by: As directed      Allergies as of 07/25/2020   No Known Allergies     Medication List    STOP taking these medications   losartan 100 MG tablet Commonly known as: COZAAR     TAKE these medications   Accu-Chek Aviva Plus test strip Generic drug: glucose blood Use as instructed   accu-chek multiclix lancets Use as instructed three times daily.   acetaminophen 500 MG tablet Commonly known as: TYLENOL Take 1,000 mg by mouth every 6 (six) hours as needed for headache (pain).   Alcohol Pads 70 % Pads 1 each by Does not apply route 4 (four) times daily as needed.   amLODipine 10 MG tablet Commonly known as: NORVASC TAKE 1 TABLET BY MOUTH ONCE DAILY   Auryxia 1 GM 210 MG(Fe) tablet Generic drug: ferric citrate Take 210 mg by mouth 3 (three) times daily with meals.   Basaglar KwikPen 100 UNIT/ML Inject 0.2 mLs (20 Units total) into the skin daily. What changed:   how much to take  when to take this  additional instructions   carvedilol 25 MG tablet Commonly known as: COREG Take 25 mg by mouth at bedtime.   Dialyvite 800 0.8 MG Tabs Take 1 tablet by mouth 3 (three) times daily with meals.   hydrALAZINE 100 MG tablet Commonly known as: APRESOLINE Take 100 mg by mouth 3 (three) times daily.   NovoLOG FlexPen 100 UNIT/ML FlexPen Generic drug: insulin aspart Inject 5 Units into the skin 3 (three) times daily with meals. What changed:   when to take this  reasons to take this   pantoprazole 40 MG tablet Commonly known as: Protonix Take 1 tablet (40 mg total) by mouth daily.   Pen Needles 32G X 4 MM Misc 100 pens by  Does not apply route 4 (four) times daily. Use to inject insulin four times daily       No Known Allergies  Consultations:  Nephrology  Dr. Benson Norway [facility]   Procedures/Studies: DG Chest Port 1 View  Result Date: 07/22/2020 CLINICAL DATA:  Intubation.  Respiratory failure. EXAM: PORTABLE CHEST 1 VIEW COMPARISON:  07/20/2020. FINDINGS: Endotracheal tube and NG tube in stable position. Stable cardiomegaly. Interim significant resolution of bilateral pulmonary interstitial infiltrates/edema. Findings suggest improving CHF. Mild residual interstitial prominence noted. No prominent pleural effusion or pneumothorax. Right costophrenic angle incompletely imaged. Degenerative change thoracic spine. IMPRESSION: 1. Endotracheal tube and NG tube in stable position. 2. Stable cardiomegaly. Interim significant resolution of bilateral pulmonary interstitial infiltrates/edema. Mild residual interstitial prominence noted. Findings most consistent with resolving CHF. Electronically Signed   By: Marcello Moores  Register   On: 07/22/2020 06:35   DG Chest Portable 1 View  Result Date: 07/20/2020 CLINICAL DATA:  55 year old male status post intubation. EXAM: PORTABLE CHEST 1 VIEW COMPARISON:  Earlier chest radiograph dated 07/20/2020. FINDINGS: Endotracheal tube with tip approximately 6.5 cm above the Patricio Popwell. Enteric tube extends below the diaphragm with tip beyond the inferior margin of the image. Cardiomegaly with vascular congestion and edema. Superimposed pneumonia is not excluded clinical correlation is recommended. Overall interval progression of pulmonary opacities compared to the earlier radiograph. No pleural effusion pneumothorax. No acute osseous pathology. IMPRESSION: 1. Endotracheal tube above the Haralambos Yeatts. 2. Cardiomegaly with findings of CHF. Superimposed pneumonia is not excluded. Clinical correlation is recommended. Electronically Signed   By: Anner Crete M.D.   On: 07/20/2020 20:10   DG Chest Port 1  View  Result Date: 07/20/2020 CLINICAL DATA:  Confusion, no dialysis for several days EXAM: PORTABLE CHEST 1 VIEW COMPARISON:  07/04/2020 FINDINGS: Pulmonary vascular congestion. Improved pulmonary edema. No pleural effusion. Top-normal heart size. IMPRESSION: Improved pulmonary edema with persistent pulmonary vascular congestion. Electronically Signed   By: Macy Mis M.D.   On: 07/20/2020 12:40   DG Chest Port 1 View  Result Date: 07/04/2020 CLINICAL DATA:  55 year old male with altered mental status. EXAM: PORTABLE CHEST 1 VIEW COMPARISON:  05/11/2020 FINDINGS: Similar appearing moderate cardiomegaly. Cephalization of pulmonary vasculature. Scattered, basal predominant patchy pulmonary opacities. No pleural effusion or pneumothorax. No acute osseous abnormality. IMPRESSION: Mild pulmonary edema, likely cardiogenic given similar appearing moderate cardiomegaly. Electronically Signed   By: Ruthann Cancer MD   On: 07/04/2020 10:47   ECHOCARDIOGRAM COMPLETE  Result Date: 07/21/2020    ECHOCARDIOGRAM REPORT   Patient Name:   ANTHONE PRIEUR Date of Exam: 07/21/2020 Medical Rec #:  [id number]         Height:       71.0 in Accession #:    [id number]        Weight:       185.0 lb Date of Birth:  Jan 26, 1966        BSA:          2.040 m Patient Age:    23 years          BP:           127/49 mmHg Patient Gender: M                 HR:           62 bpm. Exam Location:  Inpatient Procedure: 2D Echo, 3D Echo, Color Doppler and Cardiac Doppler Indications:    Cardiac Arrest i46.9  History:        Patient has prior history of Echocardiogram examinations, most                 recent 01/20/2018. Risk Factors:Hypertension, Diabetes,                 Dyslipidemia and COVID+ 05/2019.  Sonographer:    Raquel Sarna Senior RDCS Referring Phys: [id number] [place]  1. Left ventricular ejection fraction, by estimation, is 25 to 30%. The left ventricle has severely decreased function. The left ventricle demonstrates  global hypokinesis. There is severe left ventricular hypertrophy. Left ventricular diastolic parameters are consistent with Grade I diastolic dysfunction (impaired relaxation). Elevated left atrial pressure.  2. Right ventricular systolic function is mildly reduced. The right ventricular size is normal. There is normal pulmonary artery systolic pressure.  3. Left atrial size was mildly dilated.  4. Moderate pleural effusion in the left lateral region.  5.  The mitral valve is normal in structure. No evidence of mitral valve regurgitation. No evidence of mitral stenosis.  6. The aortic valve is tricuspid. Aortic valve regurgitation is not visualized. Mild aortic valve sclerosis is present, with no evidence of aortic valve stenosis.  7. The inferior vena cava is dilated in size with >50% respiratory variability, suggesting right atrial pressure of 8 mmHg. FINDINGS  Left Ventricle: Left ventricular ejection fraction, by estimation, is 25 to 30%. The left ventricle has severely decreased function. The left ventricle demonstrates global hypokinesis. The left ventricular internal cavity size was normal in size. There is severe left ventricular hypertrophy. Left ventricular diastolic parameters are consistent with Grade I diastolic dysfunction (impaired relaxation). Elevated left atrial pressure. Right Ventricle: The right ventricular size is normal.Right ventricular systolic function is mildly reduced. There is normal pulmonary artery systolic pressure. The tricuspid regurgitant velocity is 1.95 m/s, and with an assumed right atrial pressure of 15 mmHg, the estimated right ventricular systolic pressure is 15.4 mmHg. Left Atrium: Left atrial size was mildly dilated. Right Atrium: Right atrial size was normal in size. Pericardium: There is no evidence of pericardial effusion. Mitral Valve: The mitral valve is normal in structure. No evidence of mitral valve regurgitation. No evidence of mitral valve stenosis. Tricuspid  Valve: The tricuspid valve is normal in structure. Tricuspid valve regurgitation is trivial. No evidence of tricuspid stenosis. Aortic Valve: The aortic valve is tricuspid. Aortic valve regurgitation is not visualized. Mild aortic valve sclerosis is present, with no evidence of aortic valve stenosis. Pulmonic Valve: The pulmonic valve was normal in structure. Pulmonic valve regurgitation is not visualized. No evidence of pulmonic stenosis. Aorta: The aortic root is normal in size and structure. Venous: The inferior vena cava is dilated in size with greater than 50% respiratory variability, suggesting right atrial pressure of 8 mmHg. IAS/Shunts: No atrial level shunt detected by color flow Doppler. Additional Comments: There is a moderate pleural effusion in the left lateral region.  LEFT VENTRICLE PLAX 2D LVIDd:         5.30 cm  Diastology LVIDs:         4.10 cm  LV e' medial:    4.79 cm/s LV PW:         1.50 cm  LV E/e' medial:  16.8 LV IVS:        1.00 cm  LV e' lateral:   3.92 cm/s LVOT diam:     2.10 cm  LV E/e' lateral: 20.6 LV SV:         64 LV SV Index:   31 LVOT Area:     3.46 cm  RIGHT VENTRICLE RV S prime:     9.03 cm/s TAPSE (M-mode): 2.0 cm LEFT ATRIUM             Index       RIGHT ATRIUM           Index LA diam:        4.30 cm 2.11 cm/m  RA Area:     13.40 cm LA Vol (A2C):   75.2 ml 36.84 ml/m RA Volume:   32.60 ml  15.98 ml/m LA Vol (A4C):   78.4 ml 38.44 ml/m LA Biplane Vol: 78.7 ml 38.58 ml/m  AORTIC VALVE LVOT Vmax:   85.50 cm/s LVOT Vmean:  62.400 cm/s LVOT VTI:    0.185 m  AORTA Ao Asc diam: 3.40 cm MITRAL VALVE  TRICUSPID VALVE MV Area (PHT): 1.89 cm    TR Peak grad:   15.2 mmHg MV Decel Time: 401 msec    TR Vmax:        195.00 cm/s MV E velocity: 80.60 cm/s MV A velocity: 87.40 cm/s  SHUNTS MV E/A ratio:  0.92        Systemic VTI:  0.18 m                            Systemic Diam: 2.10 cm Kirk Ruths MD Electronically signed by Kirk Ruths MD Signature Date/Time:  07/21/2020/1:48:43 PM    Final       Subjective:   Discharge Exam: Vitals:   07/25/20 1102 07/25/20 1708  BP: (!) 157/83 (!) 156/77  Pulse: 79 78  Resp: 15 16  Temp: 98.4 F (36.9 C) 98.9 F (37.2 C)  SpO2: 98% 99%   Vitals:   07/25/20 0555 07/25/20 0900 07/25/20 1102 07/25/20 1708  BP:  (!) 161/72 (!) 157/83 (!) 156/77  Pulse:  79 79 78  Resp: 18 13 15 16   Temp:  98.4 F (36.9 C) 98.4 F (36.9 C) 98.9 F (37.2 C)  TempSrc:  Oral Oral Oral  SpO2:  98% 98% 99%  Weight: 76.6 kg     Height:        General: Pt is alert, awake, not in acute distress Cardiovascular: RRR, S1/S2 +, no rubs, no gallops Respiratory: CTA bilaterally, no wheezing, no rhonchi Abdominal: Soft, NT, ND, bowel sounds + Extremities: normal ROM  Alert oriented and appropriate walking throughout room talking to relatives on phone    The results of significant diagnostics from this hospitalization (including imaging, microbiology, ancillary and laboratory) are listed below for reference.     Microbiology: Recent Results (from the past 240 hour(s))  SARS Coronavirus 2 by RT PCR (hospital order, performed in [facility] hospital lab) Nasopharyngeal Nasopharyngeal Swab     Status: None   Collection Time: 07/20/20 12:31 PM   Specimen: Nasopharyngeal Swab  Result Value Ref Range Status   SARS Coronavirus 2 NEGATIVE NEGATIVE Final    Comment: (NOTE) SARS-CoV-2 target nucleic acids are NOT DETECTED.  The SARS-CoV-2 RNA is generally detectable in upper and lower respiratory specimens during the acute phase of infection. The lowest concentration of SARS-CoV-2 viral copies this assay can detect is 250 copies / mL. A negative result does not preclude SARS-CoV-2 infection and should not be used as the sole basis for treatment or other patient management decisions.  A negative result may occur with improper specimen collection / handling, submission of specimen other than nasopharyngeal swab, presence of  viral mutation(s) within the areas targeted by this assay, and inadequate number of viral copies (<250 copies / mL). A negative result must be combined with clinical observations, patient history, and epidemiological information.  Fact Sheet for Patients:   StrictlyIdeas.no  Fact Sheet for Healthcare Providers: BankingDealers.co.za  This test is not yet approved or  cleared by the [place] FDA and has been authorized for detection and/or diagnosis of SARS-CoV-2 by FDA under an Emergency Use Authorization (EUA).  This EUA will remain in effect (meaning this test can be used) for the duration of the COVID-19 declaration under Section 564(b)(1) of the Act, 21 U.S.C. section 360bbb-3(b)(1), unless the authorization is terminated or revoked sooner.  Performed at [facility], [place] [address], [place], [place] [address]   MRSA PCR Screening  Status: None   Collection Time: 07/21/20  9:23 AM   Specimen: Nasal Mucosa; Nasopharyngeal  Result Value Ref Range Status   MRSA by PCR NEGATIVE NEGATIVE Final    Comment:        The GeneXpert MRSA Assay (FDA approved for NASAL specimens only), is one component of a comprehensive MRSA colonization surveillance program. It is not intended to diagnose MRSA infection nor to guide or monitor treatment for MRSA infections. Performed at [facility], [place] [address] [place], [place] [address]      Labs: BNP (last 3 results) Recent Labs    07/04/20 1252 07/20/20 2109  BNP 1,680.9* 1,660.6*   Basic Metabolic Panel: Recent Labs  Lab 07/21/20 0558 07/22/20 3016 07/23/20 0033 07/24/20 0212 07/25/20 0229 07/25/20 0755  NA 133* 137 133* 131* 131*  --   K 4.7 4.0 4.2 5.1 4.0  --   CL 91* 98 97* 95* 94*  --   CO2 22 23 24 22 24   --   GLUCOSE 315* 101* 324* 390* 337* 432*  BUN 108* 66* 42* 59* 31*  --   CREATININE 12.88* 10.36* 8.83* 11.48* 7.81*  --   CALCIUM 7.9* 7.9*  7.6* 7.2* 7.4*  --    Liver Function Tests: Recent Labs  Lab 07/20/20 0427  AST 46*  ALT 36  ALKPHOS 70  BILITOT 0.5  PROT 5.8*  ALBUMIN 3.0*   No results for input(s): LIPASE, AMYLASE in the last 168 hours. No results for input(s): AMMONIA in the last 168 hours. CBC: Recent Labs  Lab 07/21/20 1723 07/22/20 0634 07/23/20 0033 07/23/20 1914 07/24/20 0212 07/25/20 0229  WBC 5.4 4.7 6.8  --  5.4 6.1  HGB 8.5* 8.7* 8.1* 7.9* 7.8* 8.5*  HCT 26.3* 26.7* 25.6* 24.3* 24.5* 26.6*  MCV 81.4 81.9 83.1  --  82.2 81.8  PLT 273 264 254  --  230 276   Cardiac Enzymes: No results for input(s): CKTOTAL, CKMB, CKMBINDEX, TROPONINI in the last 168 hours. BNP: Invalid input(s): POCBNP CBG: Recent Labs  Lab 07/24/20 2358 07/25/20 0552 07/25/20 0633 07/25/20 1100 07/25/20 1709  GLUCAP 175* 408* 424* 266* 281*   D-Dimer No results for input(s): DDIMER in the last 72 hours. Hgb A1c No results for input(s): HGBA1C in the last 72 hours. Lipid Profile No results for input(s): CHOL, HDL, LDLCALC, TRIG, CHOLHDL, LDLDIRECT in the last 72 hours. Thyroid function studies No results for input(s): TSH, T4TOTAL, T3FREE, THYROIDAB in the last 72 hours.  Invalid input(s): FREET3 Anemia work up No results for input(s): VITAMINB12, FOLATE, FERRITIN, TIBC, IRON, RETICCTPCT in the last 72 hours. Urinalysis    Component Value Date/Time   COLORURINE STRAW (A) 07/04/2020 1932   APPEARANCEUR CLEAR 07/04/2020 1932   LABSPEC 1.009 07/04/2020 1932   PHURINE 9.0 (H) 07/04/2020 1932   GLUCOSEU >=500 (A) 07/04/2020 1932   HGBUR NEGATIVE 07/04/2020 [place] NEGATIVE 07/04/2020 1932   KETONESUR NEGATIVE 07/04/2020 1932   PROTEINUR >=300 (A) 07/04/2020 1932   UROBILINOGEN 0.2 04/11/2012 0555   NITRITE NEGATIVE 07/04/2020 1932   LEUKOCYTESUR NEGATIVE 07/04/2020 1932   Sepsis Labs Invalid input(s): PROCALCITONIN,  WBC,  LACTICIDVEN Microbiology Recent Results (from the past 240 hour(s))   SARS Coronavirus 2 by RT PCR (hospital order, performed in [facility] hospital lab) Nasopharyngeal Nasopharyngeal Swab     Status: None   Collection Time: 07/20/20 12:31 PM   Specimen: Nasopharyngeal Swab  Result Value Ref Range Status   SARS  Coronavirus 2 NEGATIVE NEGATIVE Final    Comment: (NOTE) SARS-CoV-2 target nucleic acids are NOT DETECTED.  The SARS-CoV-2 RNA is generally detectable in upper and lower respiratory specimens during the acute phase of infection. The lowest concentration of SARS-CoV-2 viral copies this assay can detect is 250 copies / mL. A negative result does not preclude SARS-CoV-2 infection and should not be used as the sole basis for treatment or other patient management decisions.  A negative result may occur with improper specimen collection / handling, submission of specimen other than nasopharyngeal swab, presence of viral mutation(s) within the areas targeted by this assay, and inadequate number of viral copies (<250 copies / mL). A negative result must be combined with clinical observations, patient history, and epidemiological information.  Fact Sheet for Patients:   StrictlyIdeas.no  Fact Sheet for Healthcare Providers: BankingDealers.co.za  This test is not yet approved or  cleared by the [place] FDA and has been authorized for detection and/or diagnosis of SARS-CoV-2 by FDA under an Emergency Use Authorization (EUA).  This EUA will remain in effect (meaning this test can be used) for the duration of the COVID-19 declaration under Section 564(b)(1) of the Act, 21 U.S.C. section 360bbb-3(b)(1), unless the authorization is terminated or revoked sooner.  Performed at [facility], [place] [address] [place], [place] [address]   MRSA PCR Screening     Status: None   Collection Time: 07/21/20  9:23 AM   Specimen: Nasal Mucosa; Nasopharyngeal  Result Value Ref Range Status   MRSA by PCR  NEGATIVE NEGATIVE Final    Comment:        The GeneXpert MRSA Assay (FDA approved for NASAL specimens only), is one component of a comprehensive MRSA colonization surveillance program. It is not intended to diagnose MRSA infection nor to guide or monitor treatment for MRSA infections. Performed at [facility], [place] [address] [place], [place] [address]      Time coordinating discharge: Over 30 minutes  SIGNED:   Raiford Simmonds. Owens Shark,  MD  [facility] 07/25/2020, 6:26 PM Pager [phone]   If 7PM-7AM, please contact night-coverage [email or web address] Password [email or web address]

## 2020-07-25 NOTE — Discharge Instructions (Signed)
Thank you for letting us care for you during your stay.  You were admitted to the [facility] were admitted for your heart stopping--we think this related to blood loss. This was called a cardiac arrest.  Your blood count was quite low on admission but normal for the past few days.   We found the following during your stay  - Gastritis- irritation of stomach--avoid alcohol, ibuprofen, BC powder, naproxen. Take a pill called pantoprazole dialy .   We recommend follow up specifically for your heart--your heart had slightly reduced from as compared to prior--we recommend follow up with Cardiology  We also recommend follow up with Dr. Benson Norway in 1-2 weeks (GastroenterologY-  Be sure to attend dialysis  We stopped your losartan--a blood pressure pill--restart per your Kidney Doctor when able     Please follow up with your primary care physician in 1 week. We recommend a repeat blood count on early this week (Monday, Tuesday, or Wednesday)    If your symptoms worsen or return, please return to the hospital.  Please let us know if you have questions about your stay at [facility].

## 2020-07-25 NOTE — Progress Notes (Signed)
Inpatient Diabetes Program Recommendations  AACE/ADA: New Consensus Statement on Inpatient Glycemic Control (2015)  Target Ranges:  Prepandial:   less than 140 mg/dL      Peak postprandial:   less than 180 mg/dL (1-2 hours)      Critically ill patients:  140 - 180 mg/dL   Lab Results  Component Value Date   GLUCAP 266 (H) 07/25/2020   HGBA1C 10.7 (H) 07/04/2020    Review of Glycemic Control Results for Jeremiah [name], Jeremiah [name] (MRN [id number]) as of 07/25/2020 11:07  Ref. Range 07/24/2020 06:26 07/24/2020 12:29 07/24/2020 13:50 07/24/2020 14:26 07/24/2020 14:54 07/24/2020 16:19 07/24/2020 23:58 07/25/2020 05:52 07/25/2020 06:33 07/25/2020 11:00  Glucose-Capillary Latest Ref Range: 70 - 99 mg/dL 319 (H) 88 63 (L) 68 (L) 94 143 (H) 175 (H) 408 (H) 424 (H) 266 (H)  Diabetes history:DM2 Outpatient Diabetes medications:Lantus 10-20 units QAM (10 units CBG< 300 and 20 units if CBG> 300) Current orders for Inpatient glycemic control:Lantus 16 units daily, Novolog sensitive tid with meals  Inpatient Diabetes Program Recommendations:    Note blood sugars continue to be labile. May need to split basal dose to ensure it is lasting 24 hours.    Consider d/c of Lantus and add Levemir 6 units bid.   Thanks,  Adah Perl, RN, BC-ADM Inpatient Diabetes Coordinator Pager [phone] (8a-5p)

## 2020-07-25 NOTE — Progress Notes (Addendum)
[facility] Progress Note   Subjective: Seen in room. No C/Os. Net UF in HD 01/28 4.5 L.   Objective Vitals:   07/25/20 0402 07/25/20 0555 07/25/20 0900 07/25/20 1102  BP: 106/66  (!) 161/72 (!) 157/83  Pulse: 97  79 79  Resp: 20 18 13 15   Temp: 99.5 F (37.5 C)  98.4 F (36.9 C) 98.4 F (36.9 C)  TempSrc: Oral  Oral Oral  SpO2: 98%  98% 98%  Weight:  76.6 kg    Height:       Physical Exam General: WN,WD NAD HEENT: Periorbital edema present Heart: S1,S2 RRR SR on monitor.  Lungs: CTAB Abdomen: active BS Extremities: No LE edema Dialysis Access: AVF +T/B  Additional Objective Labs: Basic Metabolic Panel: Recent Labs  Lab 07/23/20 0033 07/24/20 0212 07/25/20 0229 07/25/20 0755  NA 133* 131* 131*  --   K 4.2 5.1 4.0  --   CL 97* 95* 94*  --   CO2 24 22 24   --   GLUCOSE 324* 390* 337* 432*  BUN 42* 59* 31*  --   CREATININE 8.83* 11.48* 7.81*  --   CALCIUM 7.6* 7.2* 7.4*  --    Liver Function Tests: Recent Labs  Lab 07/20/20 0427  AST 46*  ALT 36  ALKPHOS 70  BILITOT 0.5  PROT 5.8*  ALBUMIN 3.0*   No results for input(s): LIPASE, AMYLASE in the last 168 hours. CBC: Recent Labs  Lab 07/21/20 1723 07/22/20 0634 07/23/20 0033 07/23/20 1914 07/24/20 0212 07/25/20 0229  WBC 5.4 4.7 6.8  --  5.4 6.1  HGB 8.5* 8.7* 8.1* 7.9* 7.8* 8.5*  HCT 26.3* 26.7* 25.6* 24.3* 24.5* 26.6*  MCV 81.4 81.9 83.1  --  82.2 81.8  PLT 273 264 254  --  230 276   Blood Culture    Component Value Date/Time   SDES BLOOD LEFT ANTECUBITAL 06/17/2019 2011   SPECREQUEST  06/17/2019 2011    BOTTLES DRAWN AEROBIC AND ANAEROBIC Blood Culture adequate volume   CULT  06/17/2019 2011    NO GROWTH 5 DAYS Performed at [facility], [place] [address], [place], [place] [address]    REPTSTATUS 06/22/2019 FINAL 06/17/2019 2011    Cardiac Enzymes: No results for input(s): CKTOTAL, CKMB, CKMBINDEX, TROPONINI in the last 168 hours. CBG: Recent Labs  Lab  07/24/20 1619 07/24/20 2358 07/25/20 0552 07/25/20 0633 07/25/20 1100  GLUCAP 143* 175* 408* 424* 266*   Iron Studies: No results for input(s): IRON, TIBC, TRANSFERRIN, FERRITIN in the last 72 hours. @lablastinr3 @ Studies/Results: No results found. Medications:  . amLODipine  5 mg Oral BID  . carvedilol  25 mg Oral QHS  . Chlorhexidine Gluconate Cloth  6 each Topical Q0600  . ferric citrate  210 mg Oral TID WC  . hydrALAZINE  100 mg Oral TID  . insulin aspart  0-9 Units Subcutaneous TID WC  . insulin glargine  16 Units Subcutaneous Daily  . multivitamin  1 tablet Oral TID WC  . pantoprazole (PROTONIX) IV  40 mg Intravenous Q12H     OP EG:BTDVV MWF 4h 450/800 75kg 2/2.25 bath AVF 15 ga Hep 4000  Mircera 225 mcg IV q 2 weeks- last dose 07/13/20, due on 1/31 Venofer 50mg  weekly Hectorol 4 mcg IVP q HD  CXR - no edema , +vasc congestion, 1/26  Assessment/Plan: 1. Cardiac arrest: PEA arrest in the ED on 07/20/20. ?Vasovagal exacerbated by anemia. Now extubated, transferred out of unit. 2. Recurrent  GIB : seen by GI, EGD done in ICU 1/25 showed GU and DU w/o active bleeding. On PPI Rx. 3. Acute on chronic anemia:With melena,sprecent endoscopy and colonoscopy on 07/10/20. Received PRBC on admission.Hb 6's on admit > up to low 8's now.EGD 01/27 non-bleeding gastric and duodenal ulcer. Capsule study 01/28. DC heparin on discharge.  4. ESRD:Dialyzes MWF, last HD Friday.Had HD here Tues and Wed, and Friday. Extra HD today for vol overload. Down to dry wt now. DC heparin.  5. Hypertension/volume:Chronic volume overload. HD 01/28 Net UF 4.5 now 1.6 K above OP EDW. Looks much better. Continue lowering volume as tolerated.  6. Metabolic bone disease:Calcium slightly low, no phos reported yet.Will use higher Ca bath with HD. Continue hectorol and resume binders once eating. 7. Nutrition:Renal diet, no recent albumin. Add nepro, renal vit.  8. T2DM:Episode of  hypoglycemia 01/28. Per primary.     Rita H. Brown NP-C 07/25/2020, 12:43 PM  [facility] [phone]  Pt seen, examined and agree w assess/plan as above with additions as indicated.  [facility] 07/25/2020, 3:06 PM

## 2020-07-25 NOTE — Progress Notes (Signed)
Discharged to home with family office visits in place teaching done  

## 2020-07-25 NOTE — Progress Notes (Addendum)
PROGRESS NOTE    Jeremiah [name]  UGQ:[id number]  DOB: Feb 19, 1966  DOA: 07/20/2020 PCP: Maximiano Coss, NP Outpatient Specialists:   Hospital course:  55 year old male with end-stage renal disease on hemodialysis who presented with melena, acute blood loss anemia, complicated with PEA cardiac arrest.  Patient was extubated 07/22/2020 and transferred to care of [facility] now completing evaluation of anemia.    Subjective: Patient reports he would like to go home today.  His significant other Steele Sizer is a She reports that he is far too weak for this.  He is alert and oriented to person and place but cannot adequately describe the reason for hospital admission or the most recent events of the last 24 hours.  The patient is completing his capsule study at this time.  He reports he has not yet passed the capsule  Objective: Vitals:   07/25/20 0402 07/25/20 0555 07/25/20 0900 07/25/20 1102  BP: 106/66  (!) 161/72 (!) 157/83  Pulse: 97  79 79  Resp: 20 18 13 15   Temp: 99.5 F (37.5 C)  98.4 F (36.9 C) 98.4 F (36.9 C)  TempSrc: Oral  Oral Oral  SpO2: 98%  98% 98%  Weight:  76.6 kg    Height:        Intake/Output Summary (Last 24 hours) at 07/25/2020 1333 Last data filed at 07/25/2020 1102 Gross per 24 hour  Intake 840 ml  Output 4500 ml  Net -3660 ml   Filed Weights   07/24/20 0500 07/24/20 0951 07/25/20 0555  Weight: 79.9 kg 79.9 kg 76.6 kg     Exam:  General: Pleasant awake alert sitting by window Eyes: sclera anicteric, conjuctiva mild injection bilaterally mild right-sided as compared to left-sided periorbital edema CVS: S1-S2, regular  Respiratory:  decreased air entry bilaterally secondary to decreased inspiratory effort, rales at bases  GI: NABS, soft, NT  LE: No edema.  Neuro: grossly nonfocal.  Psych: Patient is hyperglycemic clinically.   Assessment & Plan:    Type 2 diabetes, with both hyper and hypoglycemia over the last 24  hours -Will gently increase Lantus and monitor for symptoms of hypoglycemia.   -Continue to monitor closely  Acute blood loss anemia likely due to gastrointestinal bleeding Patient underwent EGD 1/25 which showed gastritis and a nonbleeding ulcer Followed biopsies for H. Pylori Transfuse for hemoglobin less than 8 Patient is complaining capsule study today  ESRD HD per nephrology  HTN Blood pressure under reasonable control on home doses of amlodipine, carvedilol and oral hydralazine  Status post PEA arrest  Patient is doing well after extubation mental status markedly improving  Periorbital edema no conjunctival injection or surrounding erythema concerning for cellulitis, no recent head trauma suspect due to slight volume overload and low serum protein continue to monitor for similar vascular imaging if worsens as localized to right side primarily  DVT prophylaxis: SCD Code Status: Full Family Communication: Spoke with wife on the phone yesterday Disposition Plan:   Patient is from: Home  Anticipated Discharge Location: Home vs SNF pending PT evaluation  Barriers to Discharge: Work-up of GI bleed   Is patient medically stable for Discharge: Can possibly go home January 30 or 31. At this time we are waiting the results of the capsule study to determine next steps will need to also transition to oral PPI therapy.   Consultants:  GI  Nephrology  Procedures:  Hemodialysis  Antimicrobials:  None   Data Reviewed:  Basic Metabolic Panel: Recent Labs  Lab 07/21/20 0558 07/22/20 2458 07/23/20 0033 07/24/20 0212 07/25/20 0229 07/25/20 0755  NA 133* 137 133* 131* 131*  --   K 4.7 4.0 4.2 5.1 4.0  --   CL 91* 98 97* 95* 94*  --   CO2 22 23 24 22 24   --   GLUCOSE 315* 101* 324* 390* 337* 432*  BUN 108* 66* 42* 59* 31*  --   CREATININE 12.88* 10.36* 8.83* 11.48* 7.81*  --   CALCIUM 7.9* 7.9* 7.6* 7.2* 7.4*  --    Liver Function Tests: Recent Labs  Lab  07/20/20 0427  AST 46*  ALT 36  ALKPHOS 70  BILITOT 0.5  PROT 5.8*  ALBUMIN 3.0*   No results for input(s): LIPASE, AMYLASE in the last 168 hours. No results for input(s): AMMONIA in the last 168 hours. CBC: Recent Labs  Lab 07/21/20 1723 07/22/20 0634 07/23/20 0033 07/23/20 1914 07/24/20 0212 07/25/20 0229  WBC 5.4 4.7 6.8  --  5.4 6.1  HGB 8.5* 8.7* 8.1* 7.9* 7.8* 8.5*  HCT 26.3* 26.7* 25.6* 24.3* 24.5* 26.6*  MCV 81.4 81.9 83.1  --  82.2 81.8  PLT 273 264 254  --  230 276   Cardiac Enzymes: No results for input(s): CKTOTAL, CKMB, CKMBINDEX, TROPONINI in the last 168 hours. BNP (last 3 results) No results for input(s): PROBNP in the last 8760 hours. CBG: Recent Labs  Lab 07/24/20 1619 07/24/20 2358 07/25/20 0552 07/25/20 0633 07/25/20 1100  GLUCAP 143* 175* 408* 424* 266*    Recent Results (from the past 240 hour(s))  SARS Coronavirus 2 by RT PCR (hospital order, performed in [facility] hospital lab) Nasopharyngeal Nasopharyngeal Swab     Status: None   Collection Time: 07/20/20 12:31 PM   Specimen: Nasopharyngeal Swab  Result Value Ref Range Status   SARS Coronavirus 2 NEGATIVE NEGATIVE Final    Comment: (NOTE) SARS-CoV-2 target nucleic acids are NOT DETECTED.  The SARS-CoV-2 RNA is generally detectable in upper and lower respiratory specimens during the acute phase of infection. The lowest concentration of SARS-CoV-2 viral copies this assay can detect is 250 copies / mL. A negative result does not preclude SARS-CoV-2 infection and should not be used as the sole basis for treatment or other patient management decisions.  A negative result may occur with improper specimen collection / handling, submission of specimen other than nasopharyngeal swab, presence of viral mutation(s) within the areas targeted by this assay, and inadequate number of viral copies (<250 copies / mL). A negative result must be combined with clinical observations, patient history,  and epidemiological information.  Fact Sheet for Patients:   StrictlyIdeas.no  Fact Sheet for Healthcare Providers: BankingDealers.co.za  This test is not yet approved or  cleared by the [place] FDA and has been authorized for detection and/or diagnosis of SARS-CoV-2 by FDA under an Emergency Use Authorization (EUA).  This EUA will remain in effect (meaning this test can be used) for the duration of the COVID-19 declaration under Section 564(b)(1) of the Act, 21 U.S.C. section 360bbb-3(b)(1), unless the authorization is terminated or revoked sooner.  Performed at [facility], [place] [address] [place], [place] [address]   MRSA PCR Screening     Status: None   Collection Time: 07/21/20  9:23 AM   Specimen: Nasal Mucosa; Nasopharyngeal  Result Value Ref Range Status   MRSA by PCR NEGATIVE NEGATIVE Final    Comment:        The GeneXpert MRSA Assay (  FDA approved for NASAL specimens only), is one component of a comprehensive MRSA colonization surveillance program. It is not intended to diagnose MRSA infection nor to guide or monitor treatment for MRSA infections. Performed at [facility], [place] [address], [place], [place] [address]       Studies: No results found.   Scheduled Meds: . amLODipine  5 mg Oral BID  . carvedilol  25 mg Oral QHS  . Chlorhexidine Gluconate Cloth  6 each Topical Q0600  . ferric citrate  210 mg Oral TID WC  . hydrALAZINE  100 mg Oral TID  . insulin aspart  0-9 Units Subcutaneous TID WC  . insulin glargine  16 Units Subcutaneous Daily  . multivitamin  1 tablet Oral TID WC  . pantoprazole (PROTONIX) IV  40 mg Intravenous Q12H   Continuous Infusions:   Principal Problem:   Acute upper GI bleeding Active Problems:   Poorly controlled type 2 diabetes mellitus (HCC)   Hypertension   HLD (hyperlipidemia)   ESRD (end stage renal disease) (HCC)   ABLA (acute blood loss anemia)    Cardiac arrest ([place])     Berneda Piccininni Evern Core, [facility]  If 7PM-7AM, please contact night-coverage [email or web address] Password [email or web address] 07/25/2020, 1:33 PM    LOS: 5 days

## 2020-07-25 NOTE — Evaluation (Signed)
Physical Therapy Evaluation Patient Details Name: Jeremiah [name] MRN: [id number] DOB: [birth year] Today's Date: 07/25/2020   History of Present Illness  55 year old male with end-stage renal disease on hemodialysis who presented with melena, acute blood loss anemia, complicated with PEA cardiac arrest.  Patient was extubated 07/22/2020  Clinical Impression  Pt admitted with above complications. Ambulates without assistive device >150 feet, performing higher level dynamic gait challenges, no loss of balance with these activities. Mild DOE but VSS throughout. Adequate for D/c from mobility standpoint, however will continue to monitor for any changes during admission.  Follow Up Recommendations No PT follow up    Equipment Recommendations  Other (comment) (Pt requests shower chair due to fatigue and neuropathy)    Recommendations for Other Services       Precautions / Restrictions Precautions Precautions: None Restrictions Weight Bearing Restrictions: No      Mobility  Bed Mobility Overal bed mobility: Independent                  Transfers Overall transfer level: Modified independent Equipment used: Rolling walker (2 wheeled);None             General transfer comment: performed with and without RW. No over instability noted.  Ambulation/Gait Ambulation/Gait assistance: Modified independent (Device/Increase time) Gait Distance (Feet): 175 Feet Assistive device: Rolling walker (2 wheeled);None Gait Pattern/deviations: Step-through pattern;Wide base of support   Gait velocity interpretation: >4.37 ft/sec, indicative of normal walking speed General Gait Details: Initially with RW for safety as he states he has not ambulated since admission. Pt without instability transitioned to no AD. Performed higher level dynamic gait challenges without LOB. Mild DOE, HR in 80s. SpO2 in mid-upper 90s on room air. Denies discomfort, feels a little weaker than  baseline.  Stairs Stairs:  (declines)          Wheelchair Mobility    Modified Rankin (Stroke Patients Only)       Balance Overall balance assessment: No apparent balance deficits (not formally assessed)                               Standardized Balance Assessment Standardized Balance Assessment : Dynamic Gait Index   Dynamic Gait Index Level Surface: Normal Change in Gait Speed: Normal Gait with Horizontal Head Turns: Normal Gait with Vertical Head Turns: Normal Gait and Pivot Turn: Normal Step Over Obstacle: Mild Impairment Step Around Obstacles: Normal Steps: Mild Impairment Total Score: 22       Pertinent Vitals/Pain Pain Assessment: No/denies pain    Home Living Family/patient expects to be discharged to:: Private residence Living Arrangements: Spouse/significant other Available Help at Discharge: Family;Available 24 hours/day ([place]) Type of Home: House Home Access: Stairs to enter Entrance Stairs-Rails: None Entrance Stairs-Number of Steps: 2 Home Layout: One level Home Equipment: None      Prior Function Level of Independence: Independent               Hand Dominance   Dominant Hand: Left    Extremity/Trunk Assessment   Upper Extremity Assessment Upper Extremity Assessment: Defer to OT evaluation    Lower Extremity Assessment Lower Extremity Assessment: Generalized weakness       Communication   Communication: No difficulties  Cognition Arousal/Alertness: Awake/alert Behavior During Therapy: WFL for tasks assessed/performed Overall Cognitive Status: Impaired/Different from baseline Area of Impairment: Orientation  Orientation Level: Disoriented to;Situation             General Comments: Unaware of events during admission. States admitted for elevated blood glucose and GI bleed.      General Comments General comments (skin integrity, edema, etc.): VSS    Exercises      Assessment/Plan    PT Assessment Patient needs continued PT services  PT Problem List Decreased strength;Cardiopulmonary status limiting activity       PT Treatment Interventions Gait training;DME instruction;Stair training;Functional mobility training;Therapeutic activities;Therapeutic exercise;Balance training;Cognitive remediation;Patient/family education    PT Goals (Current goals can be found in the Care Plan section)  Acute Rehab PT Goals Patient Stated Goal: Home soon PT Goal Formulation: With patient Time For Goal Achievement: 08/08/20 Potential to Achieve Goals: Good    Frequency Min 3X/week   Barriers to discharge        Co-evaluation               AM-PAC PT "6 Clicks" Mobility  Outcome Measure Help needed turning from your back to your side while in a flat bed without using bedrails?: None Help needed moving from lying on your back to sitting on the side of a flat bed without using bedrails?: None Help needed moving to and from a bed to a chair (including a wheelchair)?: None Help needed standing up from a chair using your arms (e.g., wheelchair or bedside chair)?: None Help needed to walk in hospital room?: None Help needed climbing 3-5 steps with a railing? : None 6 Click Score: 24    End of Session Equipment Utilized During Treatment: Gait belt Activity Tolerance: Patient tolerated treatment well Patient left: in bed   PT Visit Diagnosis: Other abnormalities of gait and mobility (R26.89);Muscle weakness (generalized) (M62.81)    Time: 1542-1600 PT Time Calculation (min) (ACUTE ONLY): 18 min   Charges:   PT Evaluation $PT Eval Low Complexity: 1 Low          Ellouise Newer, PT, DPT  Ellouise Newer 07/25/2020, 4:15 PM

## 2020-07-25 NOTE — Progress Notes (Signed)
Capsule endoscopy showed scant evidence of blood in the gastric lumen and a mild gastritis.  The gastritis is not a new finding.  Follow HGB and transfuse as necessary.  Signing off.  Call with any questions.  He can follow up in the office in 1-2 weeks.

## 2020-07-26 ENCOUNTER — Encounter (HOSPITAL_COMMUNITY): Payer: Self-pay | Admitting: Gastroenterology

## 2020-07-26 ENCOUNTER — Telehealth: Payer: Self-pay | Admitting: Nurse Practitioner

## 2020-07-26 NOTE — Telephone Encounter (Signed)
Transition of care contact from inpatient facility  Date of discharge: 07/25/2020 Date of contact: 07/26/2020 Method: Phone Spoke to: Patient  Patient contacted to discuss transition of care from recent inpatient hospitalization. Patient was admitted to [facility] from 01/24-01/29/2022 with discharge diagnosis of Acute upper GI Bleed, PEA arrest, New finding of HFrEF now 25%.   Medication changes were reviewed. Stressed importance of following up with cardiology post hospitalization and on compliance with hemodialysis.   Patient will follow up with his/her outpatient HD unit on: 07/27/2020.

## 2020-07-28 ENCOUNTER — Other Ambulatory Visit: Payer: Self-pay

## 2020-07-28 ENCOUNTER — Encounter (HOSPITAL_COMMUNITY): Payer: Self-pay | Admitting: Emergency Medicine

## 2020-07-28 ENCOUNTER — Emergency Department (HOSPITAL_COMMUNITY)
Admission: EM | Admit: 2020-07-28 | Discharge: 2020-07-29 | Disposition: A | Discharge status: Home / Self Care | Payer: Medicare Other | Attending: Emergency Medicine | Admitting: Emergency Medicine

## 2020-07-28 DIAGNOSIS — I120 Hypertensive chronic kidney disease with stage 5 chronic kidney disease or end stage renal disease: Secondary | ICD-10-CM | POA: Diagnosis not present

## 2020-07-28 DIAGNOSIS — E875 Hyperkalemia: Secondary | ICD-10-CM | POA: Insufficient documentation

## 2020-07-28 DIAGNOSIS — E1165 Type 2 diabetes mellitus with hyperglycemia: Secondary | ICD-10-CM | POA: Diagnosis not present

## 2020-07-28 DIAGNOSIS — N186 End stage renal disease: Secondary | ICD-10-CM | POA: Insufficient documentation

## 2020-07-28 DIAGNOSIS — Z992 Dependence on renal dialysis: Secondary | ICD-10-CM | POA: Insufficient documentation

## 2020-07-28 DIAGNOSIS — K921 Melena: Secondary | ICD-10-CM | POA: Diagnosis present

## 2020-07-28 DIAGNOSIS — R739 Hyperglycemia, unspecified: Secondary | ICD-10-CM

## 2020-07-28 DIAGNOSIS — Z794 Long term (current) use of insulin: Secondary | ICD-10-CM | POA: Diagnosis not present

## 2020-07-28 DIAGNOSIS — Z79899 Other long term (current) drug therapy: Secondary | ICD-10-CM | POA: Insufficient documentation

## 2020-07-28 LAB — COMPREHENSIVE METABOLIC PANEL
ALT: 54 U/L — ABNORMAL HIGH (ref 0–44)
AST: 61 U/L — ABNORMAL HIGH (ref 15–41)
Albumin: 2.7 g/dL — ABNORMAL LOW (ref 3.5–5.0)
Alkaline Phosphatase: 106 U/L (ref 38–126)
Anion gap: 12 (ref 5–15)
BUN: 57 mg/dL — ABNORMAL HIGH (ref 6–20)
CO2: 27 mmol/L (ref 22–32)
Calcium: 7.2 mg/dL — ABNORMAL LOW (ref 8.9–10.3)
Chloride: 93 mmol/L — ABNORMAL LOW (ref 98–111)
Creatinine, Ser: 9.63 mg/dL — ABNORMAL HIGH (ref 0.61–1.24)
GFR, Estimated: 6 mL/min — ABNORMAL LOW (ref >60)
Glucose, Bld: 615 mg/dL (ref 70–99)
Potassium: 6.1 mmol/L — ABNORMAL HIGH (ref 3.5–5.1)
Sodium: 132 mmol/L — ABNORMAL LOW (ref 135–145)
Total Bilirubin: 0.2 mg/dL — ABNORMAL LOW (ref 0.3–1.2)
Total Protein: 6.2 g/dL — ABNORMAL LOW (ref 6.5–8.1)

## 2020-07-28 LAB — CBC
HCT: 24.5 % — ABNORMAL LOW (ref 39.0–52.0)
Hemoglobin: 7.6 g/dL — ABNORMAL LOW (ref 13.0–17.0)
MCH: 26.3 pg (ref 26.0–34.0)
MCHC: 31 g/dL (ref 30.0–36.0)
MCV: 84.8 fL (ref 80.0–100.0)
Platelets: 292 10*3/uL (ref 150–400)
RBC: 2.89 MIL/uL — ABNORMAL LOW (ref 4.22–5.81)
RDW: 19.5 % — ABNORMAL HIGH (ref 11.5–15.5)
WBC: 5.9 10*3/uL (ref 4.0–10.5)
nRBC: 0 % (ref 0.0–0.2)

## 2020-07-28 LAB — TYPE AND SCREEN
ABO/RH(D): O POS
Antibody Screen: NEGATIVE

## 2020-07-28 LAB — PROTIME-INR
INR: 1.1 (ref 0.8–1.2)
Prothrombin Time: 14 seconds (ref 11.4–15.2)

## 2020-07-28 LAB — POC OCCULT BLOOD, ED: Fecal Occult Bld: NEGATIVE

## 2020-07-28 MED ORDER — INSULIN ASPART 100 UNIT/ML ~~LOC~~ SOLN
10.0000 [IU] | Freq: Once | SUBCUTANEOUS | Status: AC
  Administered 2020-07-28: 10 [IU] via INTRAVENOUS

## 2020-07-28 MED ORDER — CALCIUM GLUCONATE-NACL 1-0.675 GM/50ML-% IV SOLN
1.0000 g | Freq: Once | INTRAVENOUS | Status: AC
  Administered 2020-07-29: 1000 mg via INTRAVENOUS
  Filled 2020-07-28: qty 50

## 2020-07-28 MED ORDER — SODIUM BICARBONATE 8.4 % IV SOLN
50.0000 meq | Freq: Once | INTRAVENOUS | Status: AC
  Administered 2020-07-28: 50 meq via INTRAVENOUS
  Filled 2020-07-28: qty 50

## 2020-07-28 MED ORDER — SODIUM ZIRCONIUM CYCLOSILICATE 10 G PO PACK
10.0000 g | PACK | Freq: Once | ORAL | Status: AC
  Administered 2020-07-28: 10 g via ORAL
  Filled 2020-07-28: qty 1

## 2020-07-28 MED ORDER — ALBUTEROL SULFATE HFA 108 (90 BASE) MCG/ACT IN AERS
8.0000 | INHALATION_SPRAY | Freq: Once | RESPIRATORY_TRACT | Status: AC
  Administered 2020-07-28: 8 via RESPIRATORY_TRACT
  Filled 2020-07-28: qty 6.7
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
## 2020-07-28 NOTE — ED Triage Notes (Signed)
Pt states he was just dc this weekend from hospital for GI bleed and he continues to have dark stool.

## 2020-07-28 NOTE — ED Notes (Signed)
NA for vitals  °

## 2020-07-29 DIAGNOSIS — E1165 Type 2 diabetes mellitus with hyperglycemia: Secondary | ICD-10-CM | POA: Diagnosis not present

## 2020-07-29 LAB — CBG MONITORING, ED
Glucose-Capillary: 226 mg/dL — ABNORMAL HIGH (ref 70–99)
Glucose-Capillary: 362 mg/dL — ABNORMAL HIGH (ref 70–99)
Glucose-Capillary: 472 mg/dL — ABNORMAL HIGH (ref 70–99)

## 2020-07-29 MED ORDER — INSULIN ASPART 100 UNIT/ML ~~LOC~~ SOLN
2.0000 [IU] | Freq: Once | SUBCUTANEOUS | Status: AC
  Administered 2020-07-29: 2 [IU] via SUBCUTANEOUS

## 2020-07-29 NOTE — ED Notes (Signed)
Patient verbalizes understanding of discharge instructions. Opportunity for questioning and answers were provided. Armband removed by staff, pt discharged from ED via wheelchair to lobby to wait on his son to pick him up.

## 2020-07-29 NOTE — Discharge Instructions (Signed)
You were seen in the emergency department today for concern for dark stool.  Your blood counts are fairly similar to prior blood work you have had done and your stool sample did not show any blood which is reassuring.  Your potassium and blood sugar were very high, we gave you medicines for these.  It is very important that you go to dialysis today with your high potassium levels.  Please be sure to check your blood sugars at home and take your medications as prescribed.  Please follow-up with your primary care provider as well as with your GI doctor closely.  Please make each of them aware of your ED visit and schedule a follow-up for soon as possible.  Return to the ER for new or worsening symptoms including but not limited to fever, dizziness, lightheadedness, passing out, trouble breathing, chest pain, bright red blood in your stool, change in your stool, blood in your vomit, or any other concerns.

## 2020-07-30 ENCOUNTER — Telehealth: Payer: Self-pay

## 2020-07-30 NOTE — Telephone Encounter (Signed)
Transition Care Management Follow-up Telephone Call  Date of discharge and from where: 07/29/20 from   How have you been since you were released from the hospital? Pt stated that he is feeling well and has follow up appt that he will keep.   Any questions or concerns? No  Items Reviewed:  Did the pt receive and understand the discharge instructions provided? Yes   Medications obtained and verified? Yes   Other? No   Any new allergies since your discharge? No   Dietary orders reviewed? Yes but patient is going to do "Prep" this weekend.   Do you have support at home? Yes   Functional Questionnaire: (I = Independent and D = Dependent) ADLs: I  Bathing/Dressing- I  Meal Prep- I  Eating- I  Maintaining continence- I  Transferring/Ambulation- I  Managing Meds- I   Follow up appointments reviewed:   PCP Hospital f/u appt confirmed? No  Pt will call to schedule  Specialist Hospital f/u appt confirmed? Yes  Scheduled to see Dr. [facility] on 08/03/2020 @ Not sure of the time.   Are transportation arrangements needed? No  If their condition worsens, is the pt aware to call PCP or go to the Emergency Dept.? Yes Was the patient provided with contact information for the PCP's office or ED? Yes Was to pt encouraged to call back with questions or concerns? Yes

## 2020-08-06 ENCOUNTER — Encounter (HOSPITAL_COMMUNITY): Payer: Self-pay | Admitting: Emergency Medicine

## 2020-08-06 ENCOUNTER — Emergency Department (HOSPITAL_COMMUNITY): Payer: Medicare Other

## 2020-08-06 ENCOUNTER — Other Ambulatory Visit: Payer: Self-pay

## 2020-08-06 ENCOUNTER — Observation Stay (HOSPITAL_COMMUNITY)
Admission: EM | Admit: 2020-08-06 | Discharge: 2020-08-07 | Disposition: A | Discharge status: Home / Self Care | Payer: Medicare Other | Attending: Internal Medicine | Admitting: Internal Medicine

## 2020-08-06 DIAGNOSIS — Z20822 Contact with and (suspected) exposure to covid-19: Secondary | ICD-10-CM | POA: Insufficient documentation

## 2020-08-06 DIAGNOSIS — R9431 Abnormal electrocardiogram [ECG] [EKG]: Secondary | ICD-10-CM | POA: Diagnosis present

## 2020-08-06 DIAGNOSIS — Z992 Dependence on renal dialysis: Secondary | ICD-10-CM

## 2020-08-06 DIAGNOSIS — R55 Syncope and collapse: Principal | ICD-10-CM | POA: Insufficient documentation

## 2020-08-06 DIAGNOSIS — I132 Hypertensive heart and chronic kidney disease with heart failure and with stage 5 chronic kidney disease, or end stage renal disease: Secondary | ICD-10-CM | POA: Insufficient documentation

## 2020-08-06 DIAGNOSIS — N186 End stage renal disease: Secondary | ICD-10-CM | POA: Diagnosis not present

## 2020-08-06 DIAGNOSIS — E1165 Type 2 diabetes mellitus with hyperglycemia: Secondary | ICD-10-CM

## 2020-08-06 DIAGNOSIS — R778 Other specified abnormalities of plasma proteins: Secondary | ICD-10-CM | POA: Diagnosis not present

## 2020-08-06 DIAGNOSIS — I5030 Unspecified diastolic (congestive) heart failure: Secondary | ICD-10-CM | POA: Insufficient documentation

## 2020-08-06 DIAGNOSIS — Z79899 Other long term (current) drug therapy: Secondary | ICD-10-CM | POA: Insufficient documentation

## 2020-08-06 DIAGNOSIS — I5020 Unspecified systolic (congestive) heart failure: Secondary | ICD-10-CM

## 2020-08-06 DIAGNOSIS — I160 Hypertensive urgency: Secondary | ICD-10-CM | POA: Diagnosis present

## 2020-08-06 DIAGNOSIS — Z794 Long term (current) use of insulin: Secondary | ICD-10-CM | POA: Insufficient documentation

## 2020-08-06 DIAGNOSIS — E1122 Type 2 diabetes mellitus with diabetic chronic kidney disease: Secondary | ICD-10-CM | POA: Diagnosis not present

## 2020-08-06 DIAGNOSIS — R739 Hyperglycemia, unspecified: Secondary | ICD-10-CM

## 2020-08-06 LAB — CBC WITH DIFFERENTIAL/PLATELET
Abs Immature Granulocytes: 0.02 10*3/uL (ref 0.00–0.07)
Basophils Absolute: 0 10*3/uL (ref 0.0–0.1)
Basophils Relative: 1 %
Eosinophils Absolute: 0 10*3/uL (ref 0.0–0.5)
Eosinophils Relative: 1 %
HCT: 27.1 % — ABNORMAL LOW (ref 39.0–52.0)
Hemoglobin: 8 g/dL — ABNORMAL LOW (ref 13.0–17.0)
Immature Granulocytes: 1 %
Lymphocytes Relative: 15 %
Lymphs Abs: 0.6 10*3/uL — ABNORMAL LOW (ref 0.7–4.0)
MCH: 25.4 pg — ABNORMAL LOW (ref 26.0–34.0)
MCHC: 29.5 g/dL — ABNORMAL LOW (ref 30.0–36.0)
MCV: 86 fL (ref 80.0–100.0)
Monocytes Absolute: 0.3 10*3/uL (ref 0.1–1.0)
Monocytes Relative: 8 %
Neutro Abs: 3.1 10*3/uL (ref 1.7–7.7)
Neutrophils Relative %: 74 %
Platelets: 298 10*3/uL (ref 150–400)
RBC: 3.15 MIL/uL — ABNORMAL LOW (ref 4.22–5.81)
RDW: 21 % — ABNORMAL HIGH (ref 11.5–15.5)
WBC: 4.1 10*3/uL (ref 4.0–10.5)
nRBC: 0.7 % — ABNORMAL HIGH (ref 0.0–0.2)

## 2020-08-06 LAB — URINALYSIS, ROUTINE W REFLEX MICROSCOPIC
Bilirubin Urine: NEGATIVE
Glucose, UA: >=500 mg/dL — AB
Ketones, ur: NEGATIVE mg/dL
Leukocytes,Ua: NEGATIVE
Nitrite: NEGATIVE
Protein, ur: >300 mg/dL — AB
Specific Gravity, Urine: 1.02 (ref 1.005–1.030)
pH: 8 (ref 5.0–8.0)

## 2020-08-06 LAB — COMPREHENSIVE METABOLIC PANEL
ALT: 74 U/L — ABNORMAL HIGH (ref 0–44)
AST: 70 U/L — ABNORMAL HIGH (ref 15–41)
Albumin: 2.8 g/dL — ABNORMAL LOW (ref 3.5–5.0)
Alkaline Phosphatase: 208 U/L — ABNORMAL HIGH (ref 38–126)
Anion gap: 17 — ABNORMAL HIGH (ref 5–15)
BUN: 51 mg/dL — ABNORMAL HIGH (ref 6–20)
CO2: 25 mmol/L (ref 22–32)
Calcium: 8.1 mg/dL — ABNORMAL LOW (ref 8.9–10.3)
Chloride: 96 mmol/L — ABNORMAL LOW (ref 98–111)
Creatinine, Ser: 6.78 mg/dL — ABNORMAL HIGH (ref 0.61–1.24)
GFR, Estimated: 9 mL/min — ABNORMAL LOW (ref >60)
Glucose, Bld: 436 mg/dL — ABNORMAL HIGH (ref 70–99)
Potassium: 4.1 mmol/L (ref 3.5–5.1)
Sodium: 138 mmol/L (ref 135–145)
Total Bilirubin: 0.6 mg/dL (ref 0.3–1.2)
Total Protein: 6.3 g/dL — ABNORMAL LOW (ref 6.5–8.1)

## 2020-08-06 LAB — CBG MONITORING, ED
Glucose-Capillary: 419 mg/dL — ABNORMAL HIGH (ref 70–99)
Glucose-Capillary: 334 mg/dL — ABNORMAL HIGH (ref 70–99)
Glucose-Capillary: 365 mg/dL — ABNORMAL HIGH (ref 70–99)

## 2020-08-06 LAB — RESP PANEL BY RT-PCR (FLU A&B, COVID) ARPGX2
Influenza A by PCR: NEGATIVE
Influenza B by PCR: NEGATIVE
SARS Coronavirus 2 by RT PCR: NEGATIVE

## 2020-08-06 LAB — TROPONIN I (HIGH SENSITIVITY)
Troponin I (High Sensitivity): 52 ng/L — ABNORMAL HIGH (ref <18)
Troponin I (High Sensitivity): 62 ng/L — ABNORMAL HIGH (ref <18)

## 2020-08-06 LAB — URINALYSIS, MICROSCOPIC (REFLEX)

## 2020-08-06 LAB — GLUCOSE, CAPILLARY
Glucose-Capillary: 295 mg/dL — ABNORMAL HIGH (ref 70–99)
Glucose-Capillary: 110 mg/dL — ABNORMAL HIGH (ref 70–99)
Glucose-Capillary: 64 mg/dL — ABNORMAL LOW (ref 70–99)

## 2020-08-06 LAB — HEMOGLOBIN AND HEMATOCRIT, BLOOD
HCT: 24.7 % — ABNORMAL LOW (ref 39.0–52.0)
Hemoglobin: 8.1 g/dL — ABNORMAL LOW (ref 13.0–17.0)

## 2020-08-06 MED ORDER — RENA-VITE PO TABS
1.0000 | ORAL_TABLET | Freq: Every day | ORAL | Status: DC
  Administered 2020-08-06 – 2020-08-07 (×2): 1 via ORAL
  Filled 2020-08-06 (×3): qty 1

## 2020-08-06 MED ORDER — INSULIN GLARGINE 100 UNIT/ML ~~LOC~~ SOLN
20.0000 [IU] | Freq: Every day | SUBCUTANEOUS | Status: DC
  Administered 2020-08-06 – 2020-08-07 (×2): 20 [IU] via SUBCUTANEOUS
  Filled 2020-08-06 (×3): qty 0.2

## 2020-08-06 MED ORDER — ACETAMINOPHEN 325 MG PO TABS
650.0000 mg | ORAL_TABLET | Freq: Four times a day (QID) | ORAL | Status: DC | PRN
  Administered 2020-08-06: 650 mg via ORAL
  Filled 2020-08-06: qty 2

## 2020-08-06 MED ORDER — SODIUM CHLORIDE 0.9 % IV SOLN: 100.0000 mg | INTRAVENOUS | Status: DC

## 2020-08-06 MED ORDER — INSULIN ASPART 100 UNIT/ML ~~LOC~~ SOLN
8.0000 [IU] | Freq: Once | SUBCUTANEOUS | Status: AC
  Administered 2020-08-06: 8 [IU] via INTRAVENOUS

## 2020-08-06 MED ORDER — AMLODIPINE BESYLATE 10 MG PO TABS
10.0000 mg | ORAL_TABLET | Freq: Every day | ORAL | Status: DC
  Administered 2020-08-06 – 2020-08-07 (×2): 10 mg via ORAL
  Filled 2020-08-06: qty 2
  Filled 2020-08-06: qty 1

## 2020-08-06 MED ORDER — HEPARIN SODIUM (PORCINE) 5000 UNIT/ML IJ SOLN
5000.0000 [IU] | Freq: Three times a day (TID) | INTRAMUSCULAR | Status: DC
  Administered 2020-08-06 – 2020-08-07 (×4): 5000 [IU] via SUBCUTANEOUS
  Filled 2020-08-06 (×4): qty 1

## 2020-08-06 MED ORDER — INSULIN ASPART 100 UNIT/ML ~~LOC~~ SOLN
0.0000 [IU] | Freq: Three times a day (TID) | SUBCUTANEOUS | Status: DC
  Administered 2020-08-06: 11 [IU] via SUBCUTANEOUS
  Administered 2020-08-06: 8 [IU] via SUBCUTANEOUS
  Administered 2020-08-07: 3 [IU] via SUBCUTANEOUS

## 2020-08-06 MED ORDER — NEPRO/CARBSTEADY PO LIQD
237.0000 mL | Freq: Two times a day (BID) | ORAL | Status: DC
  Administered 2020-08-07 (×2): 237 mL via ORAL

## 2020-08-06 MED ORDER — OXYCODONE HCL 5 MG PO TABS
5.0000 mg | ORAL_TABLET | Freq: Once | ORAL | Status: AC | PRN
  Administered 2020-08-06: 5 mg via ORAL
  Filled 2020-08-06: qty 1

## 2020-08-06 MED ORDER — FERRIC CITRATE 1 GM 210 MG(FE) PO TABS
420.0000 mg | ORAL_TABLET | Freq: Three times a day (TID) | ORAL | Status: DC
Start: 2020-08-07 — End: 2020-08-07
  Administered 2020-08-07: 420 mg via ORAL
  Filled 2020-08-06 (×4): qty 2

## 2020-08-06 MED ORDER — DOXERCALCIFEROL 4 MCG/2ML IV SOLN: 5.0000 ug | INTRAVENOUS | Status: DC

## 2020-08-06 MED ORDER — PHENOL 1.4 % MT LIQD
1.0000 | OROMUCOSAL | Status: DC | PRN
  Filled 2020-08-06 (×2): qty 177

## 2020-08-06 MED ORDER — ALBUTEROL SULFATE (2.5 MG/3ML) 0.083% IN NEBU: 2.5000 mg | INHALATION_SOLUTION | Freq: Four times a day (QID) | RESPIRATORY_TRACT | Status: DC | PRN

## 2020-08-06 MED ORDER — ACETAMINOPHEN 650 MG RE SUPP: 650.0000 mg | Freq: Four times a day (QID) | RECTAL | Status: DC | PRN

## 2020-08-06 MED ORDER — MELATONIN 5 MG PO TABS
5.0000 mg | ORAL_TABLET | Freq: Every day | ORAL | Status: DC
  Administered 2020-08-06: 5 mg via ORAL
  Filled 2020-08-06 (×2): qty 1

## 2020-08-06 MED ORDER — HYDRALAZINE HCL 50 MG PO TABS
100.0000 mg | ORAL_TABLET | Freq: Three times a day (TID) | ORAL | Status: DC
  Administered 2020-08-06 – 2020-08-07 (×4): 100 mg via ORAL
  Filled 2020-08-06 (×2): qty 2
  Filled 2020-08-06: qty 4
  Filled 2020-08-06: qty 2
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
## 2020-08-06 NOTE — ED Triage Notes (Signed)
Patient arrived with EMS from home reports elevated blood sugar this morning CBG= 475 by EMS , hemodialysis treatment q Tues/Thurs/Sat. Denies fever or chills , patient did not take his insulin last night .

## 2020-08-06 NOTE — ED Notes (Signed)
Pt refusing blood work and requesting to leave. This RN paged Dr. Tamala Julian, Dr. Tamala Julian currently at bedside talking to the pt. Will continue to monitor.

## 2020-08-06 NOTE — Progress Notes (Addendum)
Inpatient Diabetes Program Recommendations  AACE/ADA: New Consensus Statement on Inpatient Glycemic Control (2015)  Target Ranges:  Prepandial:   less than 140 mg/dL      Peak postprandial:   less than 180 mg/dL (1-2 hours)      Critically ill patients:  140 - 180 mg/dL   Lab Results  Component Value Date   GLUCAP 334 (H) 08/06/2020   HGBA1C 10.7 (H) 07/04/2020    Review of Glycemic Control  Diabetes history: DM 2 Outpatient Diabetes medications: Basaglar 10-20 units Daily, Novolog 5 units tid Current orders for Inpatient glycemic control:  Lantus 20 units Daily Novolog 0-15 units tid  Inpatient Diabetes Program Recommendations:    Spoke with pt about his glucose control at home. Pt reports needing new test strips and a new meter at time of d/c. Pt also wants a sleep medication at home.   D/C:  Glucose meter kit order # [id number]  Thanks,  Tama Headings RN, MSN, BC-ADM Inpatient Diabetes Coordinator Team Pager [phone] (8a-5p)
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
## 2020-08-06 NOTE — ED Notes (Signed)
Pt ambulated well to the BR

## 2020-08-07 ENCOUNTER — Observation Stay (HOSPITAL_COMMUNITY): Payer: Medicare Other

## 2020-08-07 DIAGNOSIS — Z992 Dependence on renal dialysis: Secondary | ICD-10-CM

## 2020-08-07 DIAGNOSIS — N186 End stage renal disease: Secondary | ICD-10-CM | POA: Diagnosis not present

## 2020-08-07 DIAGNOSIS — R55 Syncope and collapse: Secondary | ICD-10-CM | POA: Diagnosis not present

## 2020-08-07 LAB — CBC
HCT: 25.1 % — ABNORMAL LOW (ref 39.0–52.0)
Hemoglobin: 8.3 g/dL — ABNORMAL LOW (ref 13.0–17.0)
MCH: 26.5 pg (ref 26.0–34.0)
MCHC: 33.1 g/dL (ref 30.0–36.0)
MCV: 80.2 fL (ref 80.0–100.0)
Platelets: 302 10*3/uL (ref 150–400)
RBC: 3.13 MIL/uL — ABNORMAL LOW (ref 4.22–5.81)
RDW: 21.3 % — ABNORMAL HIGH (ref 11.5–15.5)
WBC: 6.6 10*3/uL (ref 4.0–10.5)
nRBC: 0 % (ref 0.0–0.2)

## 2020-08-07 LAB — RENAL FUNCTION PANEL
Albumin: 2.9 g/dL — ABNORMAL LOW (ref 3.5–5.0)
Anion gap: 16 — ABNORMAL HIGH (ref 5–15)
BUN: 68 mg/dL — ABNORMAL HIGH (ref 6–20)
CO2: 26 mmol/L (ref 22–32)
Calcium: 8.3 mg/dL — ABNORMAL LOW (ref 8.9–10.3)
Chloride: 97 mmol/L — ABNORMAL LOW (ref 98–111)
Creatinine, Ser: 9.22 mg/dL — ABNORMAL HIGH (ref 0.61–1.24)
GFR, Estimated: 6 mL/min — ABNORMAL LOW (ref >60)
Glucose, Bld: 112 mg/dL — ABNORMAL HIGH (ref 70–99)
Phosphorus: 6.1 mg/dL — ABNORMAL HIGH (ref 2.5–4.6)
Potassium: 3.5 mmol/L (ref 3.5–5.1)
Sodium: 139 mmol/L (ref 135–145)

## 2020-08-07 LAB — GLUCOSE, CAPILLARY
Glucose-Capillary: 52 mg/dL — ABNORMAL LOW (ref 70–99)
Glucose-Capillary: 92 mg/dL (ref 70–99)
Glucose-Capillary: 174 mg/dL — ABNORMAL HIGH (ref 70–99)
Glucose-Capillary: 312 mg/dL — ABNORMAL HIGH (ref 70–99)
Glucose-Capillary: 330 mg/dL — ABNORMAL HIGH (ref 70–99)

## 2020-08-07 LAB — ECHOCARDIOGRAM LIMITED
Height: 71 in
S' Lateral: 3.7 cm
Weight: 2768.98 oz

## 2020-08-07 MED ORDER — BASAGLAR KWIKPEN 100 UNIT/ML ~~LOC~~ SOPN: 10.0000 [IU] | PEN_INJECTOR | SUBCUTANEOUS | 0 refills | Status: AC

## 2020-08-07 MED ORDER — MELATONIN 5 MG PO TABS: 5.0000 mg | ORAL_TABLET | Freq: Every day | ORAL | 0 refills | Status: DC

## 2020-08-07 MED ORDER — SODIUM CHLORIDE 0.9 % IV SOLN: 100.0000 mL | INTRAVENOUS | Status: DC | PRN

## 2020-08-07 MED ORDER — PENTAFLUOROPROP-TETRAFLUOROETH EX AERO: 1.0000 "application " | INHALATION_SPRAY | CUTANEOUS | Status: DC | PRN

## 2020-08-07 MED ORDER — HEPARIN SODIUM (PORCINE) 1000 UNIT/ML DIALYSIS: 4000.0000 [IU] | INTRAMUSCULAR | Status: DC | PRN

## 2020-08-07 MED ORDER — DEXTROSE 50 % IV SOLN: 25.0000 g | INTRAVENOUS | Status: AC

## 2020-08-07 MED ORDER — LIDOCAINE-PRILOCAINE 2.5-2.5 % EX CREA: 1.0000 "application " | TOPICAL_CREAM | CUTANEOUS | Status: DC | PRN

## 2020-08-07 MED ORDER — HEPARIN SODIUM (PORCINE) 1000 UNIT/ML DIALYSIS: 1000.0000 [IU] | INTRAMUSCULAR | Status: DC | PRN

## 2020-08-07 MED ORDER — BLOOD GLUCOSE MONITOR KIT: PACK | 0 refills | Status: AC

## 2020-08-07 MED ORDER — ISOSORBIDE DINITRATE 30 MG PO TABS: 30.0000 mg | ORAL_TABLET | Freq: Three times a day (TID) | ORAL | 1 refills | Status: DC

## 2020-08-07 MED ORDER — SODIUM CHLORIDE 0.9 % IV SOLN
125.0000 mg | INTRAVENOUS | Status: DC
  Administered 2020-08-07: 125 mg via INTRAVENOUS
  Filled 2020-08-07: qty 10

## 2020-08-07 MED ORDER — LIDOCAINE HCL (PF) 1 % IJ SOLN: 5.0000 mL | INTRAMUSCULAR | Status: DC | PRN

## 2020-08-07 MED ORDER — CARVEDILOL 25 MG PO TABS: 25.0000 mg | ORAL_TABLET | Freq: Two times a day (BID) | ORAL | 1 refills | Status: DC

## 2020-08-07 MED ORDER — DOXERCALCIFEROL 4 MCG/2ML IV SOLN
INTRAVENOUS | Status: AC
  Administered 2020-08-07: 5 ug via INTRAVENOUS
  Filled 2020-08-07: qty 4

## 2020-08-07 MED ORDER — ALTEPLASE 2 MG IJ SOLR: 2.0000 mg | Freq: Once | INTRAMUSCULAR | Status: DC | PRN

## 2020-08-07 MED ORDER — DEXTROSE 50 % IV SOLN
INTRAVENOUS | Status: AC
  Filled 2020-08-07: qty 50

## 2020-08-07 NOTE — Discharge Summary (Signed)
Physician Discharge Summary  Jeremiah [name] XTG:[id number] DOB: 11-23-1965 DOA: 08/06/2020  PCP: Maximiano Coss, NP  Admit date: 08/06/2020 Discharge date: 08/07/2020  Admitted From: Home Discharge disposition: Home   Recommendations for Outpatient Follow-Up:   1. Needs to be compliant with his blood sugar as well as blood pressure medications 2. Close follow-up with cardiology 3. Repeat echo shows preserved EF   Discharge Diagnosis:   Principal Problem:   Syncope Active Problems:   Poorly controlled type 2 diabetes mellitus (HCC)   ESRD (end stage renal disease) (HCC)   Elevated troponin   Hypertensive urgency   Prolonged QT interval   Heart failure with reduced ejection fraction [facility])    Discharge Condition: Improved.  Diet recommendation: Low sodium, heart healthy.  Carbohydrate-modified.   Wound care: None.  Code status: Full.   History of Present Illness:   Jeremiah [name] is a 55 y.o. male with medical history significant of ESRD on HD (MWF), HTN,  uncontrolled IDDM, GERD, GI bleed secondary to duodenal ulcer and gastritis presents after passing out at home.  He had gone to take a shower this morning and was sitting on the mode when he felt dizzy.  Patient tried to get up and told his wife and other room, but passed out on the floor prior to doing so.  Denies any significant trauma to his head and does not know how long he was out.  When he came to he reported feeling short of breath and he told his wife to call EMS. Denies having any chest pain, dark stools, palpitations, nausea, vomiting, or diarrhea.  He does not use any NSAIDs. At home his blood sugar had been elevated and patient noted he had not taken his insulin last night.  He had had hemodialysis yesterday.  Normally his dry weight is supposed to be around 175 pounds, but his weight was up to 184 pounds prior to dialysis yesterday, and only down to 180 following his treatment.  Patient had  just recently been hospitalized from 1/24-1/29 after presenting with GI bleed with hemoglobin down to 5.4 g/dL.  Patient had episode of vomiting and became bradycardic with loss of consciousness and went into PEA arrest requiring at least one round CPR and was subsequently intubated.  EGD revealed gastritis with small duodenal ulcer as likely cause of symptoms.  He had received 3 units of packed red blood cells during his hospitalization with hemoglobin noted to be stable at 8.5 prior to discharge.  Echocardiogram revealed EF of around 25-30% with grade 1 diastolic dysfunction postarrest.  Patient had been recommended to follow-up with cardiology, but had been not been able to do so yet.    Hospital Course by Problem:   Neurocardiogenic syncope, patient clearly had premonitory symptoms.  Do not suspect arrhythmic etiology especially since his EF has normalized  Diabetes mellitus type 2, uncontrolled with hyperglycemia: Patient presents with glucose initially elevated up to 436, but reported missing Lantus dose last night.  -sent in new scripts for patient  Hypertensive urgency/emergency: Acute.   -resume home meds-- encourage compliance   ESRD on HD: Last had hemodialysis on 2/9.  Normally dialyzes Monday, Wednesday, and Friday.  He reports that his weight had increased up to 184 pounds, but his dry weight is supposed be around 175 pounds.  He appears to be a little fluid overloaded but in no acute distress at this time. -s/p HD on 2/11  Recent GI bleed secondary to duodenal  ulcer and gastritis: During last hospitalization patient presented with hemoglobin down to 5.4 requiring transfusion with 3 units of packed red blood cells.  EGD by Dr. Benson Norway revealed a duodenal ulcer and gastritis.  He is supposed to be on Protonix 40 mg daily.  Prolonged QT interval: QT prolonged admission at 547. -Avoid QT prolonging medication -Improved to 495 upon discharge  Transaminitis:  - Question if this is  secondary to passive hepatic congestion due to decreased heart function and patient acutely retaining fluid. -Continue to monitor outpatient   Medical Consultants:   Cardiology Nephrology   Discharge Exam:   Vitals:   08/07/20 1416 08/07/20 1518  BP: (!) 150/92 (!) 164/78  Pulse:    Resp: 16   Temp: 98.1 F (36.7 C)   SpO2: 100%    Vitals:   08/07/20 1330 08/07/20 1400 08/07/20 1416 08/07/20 1518  BP: (!) 180/98 (!) 181/98 (!) 150/92 (!) 164/78  Pulse: 99 (!) 103    Resp:  16 16   Temp:   98.1 F (36.7 C)   TempSrc:   Oral   SpO2:   100%   Weight:   78.5 kg   Height:        General exam: Appears calm and comfortable.     The results of significant diagnostics from this hospitalization (including imaging, microbiology, ancillary and laboratory) are listed below for reference.     Procedures and Diagnostic Studies:   DG Chest Portable 1 View  Result Date: 08/06/2020 CLINICAL DATA:  55 year old male with syncope. EXAM: PORTABLE CHEST 1 VIEW COMPARISON:  Portable chest 07/22/2020 and earlier. FINDINGS: Portable AP semi upright view at 0722 hours. Extubated and enteric tube removed since last month. Mildly lower lung volumes. Chronic cardiomegaly. Other mediastinal contours are within normal limits. Visualized tracheal air column is within normal limits. Pulmonary vascularity at the upper limits of normal, no overt edema. Otherwise negative portable appearance of the lungs. No pneumothorax. No acute osseous abnormality identified. IMPRESSION: Cardiomegaly with pulmonary vascular congestion but no overt edema. Electronically Signed   By: Genevie Ann M.D.   On: 08/06/2020 07:42     Labs:   Basic Metabolic Panel: Recent Labs  Lab 08/06/20 0444 08/07/20 0330  NA 138 139  K 4.1 3.5  CL 96* 97*  CO2 25 26  GLUCOSE 436* 112*  BUN 51* 68*  CREATININE 6.78* 9.22*  CALCIUM 8.1* 8.3*  PHOS  --  6.1*   GFR Estimated Creatinine Clearance: 9.6 mL/min (A) (by C-G formula  based on SCr of 9.22 mg/dL (H)). Liver Function Tests: Recent Labs  Lab 08/06/20 0444 08/07/20 0330  AST 70*  --   ALT 74*  --   ALKPHOS 208*  --   BILITOT 0.6  --   PROT 6.3*  --   ALBUMIN 2.8* 2.9*   No results for input(s): LIPASE, AMYLASE in the last 168 hours. No results for input(s): AMMONIA in the last 168 hours. Coagulation profile No results for input(s): INR, PROTIME in the last 168 hours.  CBC: Recent Labs  Lab 08/06/20 0444 08/06/20 1826 08/07/20 0330  WBC 4.1  --  6.6  NEUTROABS 3.1  --   --   HGB 8.0* 8.1* 8.3*  HCT 27.1* 24.7* 25.1*  MCV 86.0  --  80.2  PLT 298  --  302   Cardiac Enzymes: No results for input(s): CKTOTAL, CKMB, CKMBINDEX, TROPONINI in the last 168 hours. BNP: Invalid input(s): POCBNP CBG: Recent Labs  Lab 08/06/20  2222 08/07/20 0251 08/07/20 0314 08/07/20 0627 08/07/20 1108  GLUCAP 110* 52* 92 174* 312*   D-Dimer No results for input(s): DDIMER in the last 72 hours. Hgb A1c No results for input(s): HGBA1C in the last 72 hours. Lipid Profile No results for input(s): CHOL, HDL, LDLCALC, TRIG, CHOLHDL, LDLDIRECT in the last 72 hours. Thyroid function studies No results for input(s): TSH, T4TOTAL, T3FREE, THYROIDAB in the last 72 hours.  Invalid input(s): FREET3 Anemia work up No results for input(s): VITAMINB12, FOLATE, FERRITIN, TIBC, IRON, RETICCTPCT in the last 72 hours. Microbiology Recent Results (from the past 240 hour(s))  Resp Panel by RT-PCR (Flu A&B, Covid) Nasopharyngeal Swab     Status: None   Collection Time: 08/06/20  8:29 AM   Specimen: Nasopharyngeal Swab; Nasopharyngeal(NP) swabs in vial transport medium  Result Value Ref Range Status   SARS Coronavirus 2 by RT PCR NEGATIVE NEGATIVE Final    Comment: (NOTE) SARS-CoV-2 target nucleic acids are NOT DETECTED.  The SARS-CoV-2 RNA is generally detectable in upper respiratory specimens during the acute phase of infection. The lowest concentration of  SARS-CoV-2 viral copies this assay can detect is 138 copies/mL. A negative result does not preclude SARS-Cov-2 infection and should not be used as the sole basis for treatment or other patient management decisions. A negative result may occur with  improper specimen collection/handling, submission of specimen other than nasopharyngeal swab, presence of viral mutation(s) within the areas targeted by this assay, and inadequate number of viral copies(<138 copies/mL). A negative result must be combined with clinical observations, patient history, and epidemiological information. The expected result is Negative.  Fact Sheet for Patients:  EntrepreneurPulse.com.au  Fact Sheet for Healthcare Providers:  IncredibleEmployment.be  This test is no t yet approved or cleared by the [place] FDA and  has been authorized for detection and/or diagnosis of SARS-CoV-2 by FDA under an Emergency Use Authorization (EUA). This EUA will remain  in effect (meaning this test can be used) for the duration of the COVID-19 declaration under Section 564(b)(1) of the Act, 21 U.S.C.section 360bbb-3(b)(1), unless the authorization is terminated  or revoked sooner.       Influenza A by PCR NEGATIVE NEGATIVE Final   Influenza B by PCR NEGATIVE NEGATIVE Final    Comment: (NOTE) The Xpert Xpress SARS-CoV-2/FLU/RSV plus assay is intended as an aid in the diagnosis of influenza from Nasopharyngeal swab specimens and should not be used as a sole basis for treatment. Nasal washings and aspirates are unacceptable for Xpert Xpress SARS-CoV-2/FLU/RSV testing.  Fact Sheet for Patients: EntrepreneurPulse.com.au  Fact Sheet for Healthcare Providers: IncredibleEmployment.be  This test is not yet approved or cleared by the [place] FDA and has been authorized for detection and/or diagnosis of SARS-CoV-2 by FDA under an Emergency Use  Authorization (EUA). This EUA will remain in effect (meaning this test can be used) for the duration of the COVID-19 declaration under Section 564(b)(1) of the Act, 21 U.S.C. section 360bbb-3(b)(1), unless the authorization is terminated or revoked.  Performed at [facility], [place] [address], [place], [place] [address]      Discharge Instructions:   Discharge Instructions    (HEART FAILURE PATIENTS) Call MD:  Anytime you have any of the following symptoms: 1) 3 pound weight gain in 24 hours or 5 pounds in 1 week 2) shortness of breath, with or without a dry hacking cough 3) swelling in the hands, feet or stomach 4) if you have to sleep on extra pillows  at night in order to breathe.   Complete by: As directed    Discharge instructions   Complete by: As directed    Renal/carb mod diet Be sure to take your insulin as prescribed   Heart Failure patients record your daily weight using the same scale at the same time of day   Complete by: As directed    Increase activity slowly   Complete by: As directed      Allergies as of 08/07/2020   No Known Allergies     Medication List    STOP taking these medications   Accu-Chek Aviva Plus test strip Generic drug: glucose blood   accu-chek multiclix lancets   amLODipine 10 MG tablet Commonly known as: NORVASC     TAKE these medications   acetaminophen 500 MG tablet Commonly known as: TYLENOL Take 1,000 mg by mouth every 6 (six) hours as needed for headache or mild pain.   Alcohol Pads 70 % Pads 1 each by Does not apply route 4 (four) times daily as needed.   Basaglar KwikPen 100 UNIT/ML Inject 10-20 Units into the skin See admin instructions. Injects (10-20 units) subcutaneously every morning (10 units CBG <300 mg/dL, 20 units CBG >300 mg/dL)   blood glucose meter kit and supplies Kit Dispense based on patient and insurance preference. Use up to four times daily as directed. (FOR ICD-9 250.00, 250.01).   carvedilol 25  MG tablet Commonly known as: COREG Take 1 tablet (25 mg total) by mouth 2 (two) times daily with a meal. What changed: when to take this   Dialyvite 800 0.8 MG Tabs Take 1 tablet by mouth 3 (three) times daily with meals.   ferric citrate 1 GM 210 MG(Fe) tablet Commonly known as: AURYXIA Take 630 mg by mouth 3 (three) times daily with meals.   hydrALAZINE 100 MG tablet Commonly known as: APRESOLINE Take 100 mg by mouth 3 (three) times daily.   isosorbide dinitrate 30 MG tablet Commonly known as: ISORDIL Take 1 tablet (30 mg total) by mouth 3 (three) times daily.   losartan 100 MG tablet Commonly known as: COZAAR Take 100 mg by mouth daily.   melatonin 5 MG Tabs Take 1 tablet (5 mg total) by mouth at bedtime.   NovoLOG FlexPen 100 UNIT/ML FlexPen Generic drug: insulin aspart Inject 5 Units into the skin 3 (three) times daily with meals. What changed:   when to take this  reasons to take this   pantoprazole 40 MG tablet Commonly known as: Protonix Take 1 tablet (40 mg total) by mouth daily.   Pen Needles 32G X 4 MM Misc 100 pens by Does not apply route 4 (four) times daily. Use to inject insulin four times daily         Time coordinating discharge: 35 min  Signed:  Geradine Girt DO  [facility] 08/07/2020, 3:29 PM

## 2020-08-07 NOTE — Progress Notes (Signed)
RN gave pt discharge instructions and he stated understanding. IV has been removed and the patient is getting dressed. Prescription given to the patient

## 2020-08-07 NOTE — Plan of Care (Signed)
  Problem: Education: Goal: Knowledge of condition and prescribed therapy will improve Outcome: Progressing   Problem: Cardiac: Goal: Will achieve and/or maintain adequate cardiac output Outcome: Progressing   
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
## 2020-08-07 NOTE — Progress Notes (Signed)
Hypoglycemic Event  CBG: 64  Treatment: Grape Juice   Symptoms: None   Follow-up CBG: Time:22:22pm  CBG Result: 110  Possible Reasons for Event: Poor oral intake  Comments/MD notified: Hypoglycemic protocol initiated    Deborah Dondero B Takako Minckler

## 2020-08-07 NOTE — Progress Notes (Signed)
Hypoglycemic Event Blood sugar check by patient's request.   CBG: 52  Treatment: patient refused dextrose 50% solution, snack box and italian ice given by patient's request.  Symptoms: Lethargy  Follow-up CBG: Time: 3:17 am  CBG Result: 92  Possible Reasons for Event: Poor oral intake  Comments/MD notified: Hypoglycemic protocol initiated.     Suzy Bouchard, RN.

## 2020-08-07 NOTE — Progress Notes (Incomplete)
  Echocardiogram 2D Echocardiogram has been performed.  Jeremiah [name] 08/07/2020, 3:42 PM

## 2020-08-07 NOTE — Progress Notes (Signed)
Pt transported to dialysis

## 2020-08-08 ENCOUNTER — Telehealth: Payer: Self-pay | Admitting: Cardiology

## 2020-08-08 ENCOUNTER — Telehealth: Payer: Self-pay | Admitting: Physician Assistant

## 2020-08-08 NOTE — Telephone Encounter (Signed)
Transition of care contact from inpatient facility  Date of Discharge: 08/07/20 Date of Contact: 08/08/20 Method of contact: Phone  Attempted to contact patient to discuss transition of care from inpatient admission. Patient did not answer the phone. Unable to leave message, will attempt to contact the patient again at a later time.   Anice Paganini, PA-C 08/08/2020, 12:11 PM  [employer]

## 2020-08-11 NOTE — Telephone Encounter (Signed)
08/20/2020 10:30 AM appointment

## 2020-08-14 ENCOUNTER — Emergency Department (HOSPITAL_COMMUNITY): Payer: Medicare Other

## 2020-08-14 ENCOUNTER — Inpatient Hospital Stay (HOSPITAL_COMMUNITY)
Admission: EM | Admit: 2020-08-14 | Discharge: 2020-08-16 | DRG: 154 | Discharge status: Against Medical Advice | Payer: Medicare Other | Attending: Internal Medicine | Admitting: Internal Medicine

## 2020-08-14 ENCOUNTER — Encounter (HOSPITAL_COMMUNITY): Payer: Self-pay | Admitting: Emergency Medicine

## 2020-08-14 ENCOUNTER — Other Ambulatory Visit: Payer: Self-pay

## 2020-08-14 DIAGNOSIS — Z841 Family history of disorders of kidney and ureter: Secondary | ICD-10-CM | POA: Diagnosis not present

## 2020-08-14 DIAGNOSIS — Z992 Dependence on renal dialysis: Secondary | ICD-10-CM

## 2020-08-14 DIAGNOSIS — K219 Gastro-esophageal reflux disease without esophagitis: Secondary | ICD-10-CM | POA: Diagnosis present

## 2020-08-14 DIAGNOSIS — Z8719 Personal history of other diseases of the digestive system: Secondary | ICD-10-CM | POA: Diagnosis not present

## 2020-08-14 DIAGNOSIS — Z79899 Other long term (current) drug therapy: Secondary | ICD-10-CM

## 2020-08-14 DIAGNOSIS — Z20822 Contact with and (suspected) exposure to covid-19: Secondary | ICD-10-CM | POA: Diagnosis present

## 2020-08-14 DIAGNOSIS — E8770 Fluid overload, unspecified: Secondary | ICD-10-CM

## 2020-08-14 DIAGNOSIS — Z794 Long term (current) use of insulin: Secondary | ICD-10-CM

## 2020-08-14 DIAGNOSIS — I120 Hypertensive chronic kidney disease with stage 5 chronic kidney disease or end stage renal disease: Secondary | ICD-10-CM | POA: Diagnosis present

## 2020-08-14 DIAGNOSIS — Z8249 Family history of ischemic heart disease and other diseases of the circulatory system: Secondary | ICD-10-CM | POA: Diagnosis not present

## 2020-08-14 DIAGNOSIS — M898X9 Other specified disorders of bone, unspecified site: Secondary | ICD-10-CM | POA: Diagnosis present

## 2020-08-14 DIAGNOSIS — G8929 Other chronic pain: Secondary | ICD-10-CM | POA: Diagnosis present

## 2020-08-14 DIAGNOSIS — R1313 Dysphagia, pharyngeal phase: Secondary | ICD-10-CM | POA: Diagnosis present

## 2020-08-14 DIAGNOSIS — J1282 Pneumonia due to coronavirus disease 2019: Secondary | ICD-10-CM | POA: Diagnosis present

## 2020-08-14 DIAGNOSIS — N186 End stage renal disease: Secondary | ICD-10-CM | POA: Diagnosis present

## 2020-08-14 DIAGNOSIS — R1310 Dysphagia, unspecified: Secondary | ICD-10-CM

## 2020-08-14 DIAGNOSIS — I160 Hypertensive urgency: Secondary | ICD-10-CM | POA: Diagnosis present

## 2020-08-14 DIAGNOSIS — R0603 Acute respiratory distress: Secondary | ICD-10-CM | POA: Diagnosis present

## 2020-08-14 DIAGNOSIS — J3800 Paralysis of vocal cords and larynx, unspecified: Secondary | ICD-10-CM

## 2020-08-14 DIAGNOSIS — E1122 Type 2 diabetes mellitus with diabetic chronic kidney disease: Secondary | ICD-10-CM | POA: Diagnosis present

## 2020-08-14 DIAGNOSIS — J384 Edema of larynx: Secondary | ICD-10-CM | POA: Diagnosis present

## 2020-08-14 DIAGNOSIS — Z833 Family history of diabetes mellitus: Secondary | ICD-10-CM

## 2020-08-14 DIAGNOSIS — J3801 Paralysis of vocal cords and larynx, unilateral: Secondary | ICD-10-CM | POA: Diagnosis present

## 2020-08-14 DIAGNOSIS — M1990 Unspecified osteoarthritis, unspecified site: Secondary | ICD-10-CM | POA: Diagnosis present

## 2020-08-14 DIAGNOSIS — I469 Cardiac arrest, cause unspecified: Secondary | ICD-10-CM | POA: Diagnosis not present

## 2020-08-14 DIAGNOSIS — G931 Anoxic brain damage, not elsewhere classified: Secondary | ICD-10-CM | POA: Diagnosis not present

## 2020-08-14 DIAGNOSIS — E1165 Type 2 diabetes mellitus with hyperglycemia: Secondary | ICD-10-CM | POA: Diagnosis present

## 2020-08-14 DIAGNOSIS — J189 Pneumonia, unspecified organism: Secondary | ICD-10-CM

## 2020-08-14 DIAGNOSIS — Z8674 Personal history of sudden cardiac arrest: Secondary | ICD-10-CM

## 2020-08-14 DIAGNOSIS — D509 Iron deficiency anemia, unspecified: Secondary | ICD-10-CM | POA: Diagnosis present

## 2020-08-14 DIAGNOSIS — J690 Pneumonitis due to inhalation of food and vomit: Secondary | ICD-10-CM | POA: Diagnosis not present

## 2020-08-14 DIAGNOSIS — E1140 Type 2 diabetes mellitus with diabetic neuropathy, unspecified: Secondary | ICD-10-CM | POA: Diagnosis present

## 2020-08-14 LAB — RESP PANEL BY RT-PCR (FLU A&B, COVID) ARPGX2
Influenza A by PCR: NEGATIVE
Influenza B by PCR: NEGATIVE
SARS Coronavirus 2 by RT PCR: NEGATIVE

## 2020-08-14 LAB — COMPREHENSIVE METABOLIC PANEL
ALT: 65 U/L — ABNORMAL HIGH (ref 0–44)
AST: 35 U/L (ref 15–41)
Albumin: 3 g/dL — ABNORMAL LOW (ref 3.5–5.0)
Alkaline Phosphatase: 175 U/L — ABNORMAL HIGH (ref 38–126)
Anion gap: 15 (ref 5–15)
BUN: 56 mg/dL — ABNORMAL HIGH (ref 6–20)
CO2: 25 mmol/L (ref 22–32)
Calcium: 8.3 mg/dL — ABNORMAL LOW (ref 8.9–10.3)
Chloride: 95 mmol/L — ABNORMAL LOW (ref 98–111)
Creatinine, Ser: 9.56 mg/dL — ABNORMAL HIGH (ref 0.61–1.24)
GFR, Estimated: 6 mL/min — ABNORMAL LOW (ref >60)
Glucose, Bld: 395 mg/dL — ABNORMAL HIGH (ref 70–99)
Potassium: 4.5 mmol/L (ref 3.5–5.1)
Sodium: 135 mmol/L (ref 135–145)
Total Bilirubin: 1.1 mg/dL (ref 0.3–1.2)
Total Protein: 6.8 g/dL (ref 6.5–8.1)

## 2020-08-14 LAB — CBG MONITORING, ED
Glucose-Capillary: 247 mg/dL — ABNORMAL HIGH (ref 70–99)
Glucose-Capillary: 226 mg/dL — ABNORMAL HIGH (ref 70–99)

## 2020-08-14 LAB — CBC WITH DIFFERENTIAL/PLATELET
Abs Immature Granulocytes: 0.02 10*3/uL (ref 0.00–0.07)
Basophils Absolute: 0.1 10*3/uL (ref 0.0–0.1)
Basophils Relative: 1 %
Eosinophils Absolute: 0 10*3/uL (ref 0.0–0.5)
Eosinophils Relative: 1 %
HCT: 25.3 % — ABNORMAL LOW (ref 39.0–52.0)
Hemoglobin: 7.9 g/dL — ABNORMAL LOW (ref 13.0–17.0)
Immature Granulocytes: 0 %
Lymphocytes Relative: 9 %
Lymphs Abs: 0.5 10*3/uL — ABNORMAL LOW (ref 0.7–4.0)
MCH: 26.1 pg (ref 26.0–34.0)
MCHC: 31.2 g/dL (ref 30.0–36.0)
MCV: 83.5 fL (ref 80.0–100.0)
Monocytes Absolute: 0.6 10*3/uL (ref 0.1–1.0)
Monocytes Relative: 10 %
Neutro Abs: 5.1 10*3/uL (ref 1.7–7.7)
Neutrophils Relative %: 79 %
Platelets: 358 10*3/uL (ref 150–400)
RBC: 3.03 MIL/uL — ABNORMAL LOW (ref 4.22–5.81)
RDW: 20.5 % — ABNORMAL HIGH (ref 11.5–15.5)
WBC: 6.4 10*3/uL (ref 4.0–10.5)
nRBC: 0 % (ref 0.0–0.2)

## 2020-08-14 LAB — BRAIN NATRIURETIC PEPTIDE: B Natriuretic Peptide: 3969.8 pg/mL — ABNORMAL HIGH (ref 0.0–100.0)

## 2020-08-14 MED ORDER — CHLORHEXIDINE GLUCONATE CLOTH 2 % EX PADS
6.0000 | MEDICATED_PAD | Freq: Every day | CUTANEOUS | Status: DC
  Administered 2020-08-15: 6 via TOPICAL

## 2020-08-14 MED ORDER — PHENOL 1.4 % MT LIQD
1.0000 | OROMUCOSAL | Status: DC | PRN
  Administered 2020-08-14 – 2020-08-15 (×2): 1 via OROMUCOSAL
  Filled 2020-08-14: qty 177

## 2020-08-14 MED ORDER — IOHEXOL 300 MG/ML  SOLN
75.0000 mL | Freq: Once | INTRAMUSCULAR | Status: AC | PRN
  Administered 2020-08-14: 75 mL via INTRAVENOUS

## 2020-08-14 MED ORDER — SODIUM CHLORIDE 0.9 % IV SOLN
1.0000 g | Freq: Once | INTRAVENOUS | Status: AC
  Administered 2020-08-14: 1 g via INTRAVENOUS
  Filled 2020-08-14: qty 10

## 2020-08-14 MED ORDER — LOSARTAN POTASSIUM 50 MG PO TABS
100.0000 mg | ORAL_TABLET | Freq: Every day | ORAL | Status: DC
  Administered 2020-08-15: 100 mg via ORAL
  Filled 2020-08-14 (×2): qty 2

## 2020-08-14 MED ORDER — HEPARIN SODIUM (PORCINE) 5000 UNIT/ML IJ SOLN
5000.0000 [IU] | Freq: Three times a day (TID) | INTRAMUSCULAR | Status: DC
  Administered 2020-08-14 – 2020-08-15 (×2): 5000 [IU] via SUBCUTANEOUS
  Filled 2020-08-14 (×2): qty 1

## 2020-08-14 MED ORDER — CARVEDILOL 25 MG PO TABS
25.0000 mg | ORAL_TABLET | Freq: Two times a day (BID) | ORAL | Status: DC
  Administered 2020-08-15 (×2): 25 mg via ORAL
  Filled 2020-08-14 (×2): qty 1
  Filled 2020-08-14: qty 2

## 2020-08-14 MED ORDER — ALBUTEROL SULFATE (2.5 MG/3ML) 0.083% IN NEBU: 2.5000 mg | INHALATION_SOLUTION | Freq: Four times a day (QID) | RESPIRATORY_TRACT | Status: DC | PRN

## 2020-08-14 MED ORDER — FERRIC CITRATE 1 GM 210 MG(FE) PO TABS
630.0000 mg | ORAL_TABLET | Freq: Three times a day (TID) | ORAL | Status: DC
  Administered 2020-08-15 (×3): 630 mg via ORAL
  Filled 2020-08-14 (×4): qty 3

## 2020-08-14 MED ORDER — INSULIN ASPART 100 UNIT/ML ~~LOC~~ SOLN
0.0000 [IU] | Freq: Three times a day (TID) | SUBCUTANEOUS | Status: DC
  Administered 2020-08-14: 3 [IU] via SUBCUTANEOUS
  Administered 2020-08-15: 1 [IU] via SUBCUTANEOUS
  Administered 2020-08-15 (×2): 2 [IU] via SUBCUTANEOUS

## 2020-08-14 MED ORDER — ALBUTEROL SULFATE HFA 108 (90 BASE) MCG/ACT IN AERS
4.0000 | INHALATION_SPRAY | Freq: Once | RESPIRATORY_TRACT | Status: AC
  Administered 2020-08-14: 4 via RESPIRATORY_TRACT
  Filled 2020-08-14: qty 6.7

## 2020-08-14 MED ORDER — INSULIN GLARGINE 100 UNIT/ML ~~LOC~~ SOLN
20.0000 [IU] | Freq: Every day | SUBCUTANEOUS | Status: DC
  Administered 2020-08-14 – 2020-08-15 (×2): 20 [IU] via SUBCUTANEOUS
  Filled 2020-08-14 (×3): qty 0.2

## 2020-08-14 MED ORDER — HYDRALAZINE HCL 20 MG/ML IJ SOLN: 10.0000 mg | INTRAMUSCULAR | Status: DC | PRN

## 2020-08-14 MED ORDER — DIALYVITE 800 0.8 MG PO TABS: 1.0000 | ORAL_TABLET | Freq: Three times a day (TID) | ORAL | Status: DC

## 2020-08-14 MED ORDER — DEXAMETHASONE SODIUM PHOSPHATE 10 MG/ML IJ SOLN
10.0000 mg | Freq: Once | INTRAMUSCULAR | Status: AC
  Administered 2020-08-14: 10 mg via INTRAVENOUS
  Filled 2020-08-14: qty 1

## 2020-08-14 MED ORDER — RENA-VITE PO TABS
1.0000 | ORAL_TABLET | Freq: Three times a day (TID) | ORAL | Status: DC
  Administered 2020-08-15 (×3): 1 via ORAL
  Filled 2020-08-14 (×4): qty 1

## 2020-08-14 MED ORDER — ACETAMINOPHEN 650 MG RE SUPP: 650.0000 mg | Freq: Four times a day (QID) | RECTAL | Status: DC | PRN

## 2020-08-14 MED ORDER — PANTOPRAZOLE SODIUM 40 MG PO TBEC
40.0000 mg | DELAYED_RELEASE_TABLET | Freq: Every day | ORAL | Status: DC
Start: 2020-08-14 — End: 2020-08-16
  Administered 2020-08-15: 40 mg via ORAL
  Filled 2020-08-14 (×2): qty 1

## 2020-08-14 MED ORDER — SODIUM CHLORIDE 0.9 % IV SOLN
500.0000 mg | INTRAVENOUS | Status: DC
  Administered 2020-08-15: 500 mg via INTRAVENOUS
  Filled 2020-08-14 (×2): qty 500

## 2020-08-14 MED ORDER — SODIUM CHLORIDE 0.9% FLUSH
3.0000 mL | Freq: Two times a day (BID) | INTRAVENOUS | Status: DC
  Administered 2020-08-14 – 2020-08-15 (×3): 3 mL via INTRAVENOUS

## 2020-08-14 MED ORDER — ACETAMINOPHEN 325 MG PO TABS: 650.0000 mg | ORAL_TABLET | Freq: Four times a day (QID) | ORAL | Status: DC | PRN

## 2020-08-14 MED ORDER — SODIUM CHLORIDE 0.9 % IV SOLN
1.0000 g | INTRAVENOUS | Status: DC
  Administered 2020-08-15: 1 g via INTRAVENOUS
  Filled 2020-08-14: qty 1
  Filled 2020-08-14: qty 10

## 2020-08-14 MED ORDER — ISOSORBIDE DINITRATE 30 MG PO TABS
30.0000 mg | ORAL_TABLET | Freq: Three times a day (TID) | ORAL | Status: DC
  Administered 2020-08-14 – 2020-08-15 (×4): 30 mg via ORAL
  Filled 2020-08-14 (×6): qty 1

## 2020-08-14 MED ORDER — HYDRALAZINE HCL 25 MG PO TABS
100.0000 mg | ORAL_TABLET | Freq: Three times a day (TID) | ORAL | Status: DC
  Administered 2020-08-14 – 2020-08-15 (×4): 100 mg via ORAL
  Filled 2020-08-14 (×5): qty 4

## 2020-08-14 MED ORDER — MELATONIN 5 MG PO TABS
5.0000 mg | ORAL_TABLET | Freq: Every day | ORAL | Status: DC
  Administered 2020-08-14 – 2020-08-15 (×2): 5 mg via ORAL
  Filled 2020-08-14 (×3): qty 1

## 2020-08-14 MED ORDER — SODIUM CHLORIDE 0.9 % IV SOLN
500.0000 mg | Freq: Once | INTRAVENOUS | Status: AC
  Administered 2020-08-14: 500 mg via INTRAVENOUS
  Filled 2020-08-14: qty 500

## 2020-08-14 NOTE — ED Notes (Signed)
Patient provided with apple sauce per his request. Patient is feeding self at this time and swallowing & tolerating without issue.

## 2020-08-14 NOTE — ED Notes (Signed)
Patient provided with apple juice per his request. Patient drinking same with mild cough noted after ingestion.Patient remains aware of aspiration risk. HOB remains elevated.

## 2020-08-14 NOTE — Progress Notes (Signed)
  [facility] Progress Note   Subjective:  Discharged from [facility] 08/07/20 following syncopal episode. Earlier admission in January with severe anemia/PEA arrest/intubation. Now admitted with respiratory distress/stridor. CT neck showed supraglottic larynx soft tissue swelling. Right airspace opacity on CXR. He endorses SOB, sore throat, cough since being intubated in January. Couldn't sleep last night d/t difficulty breathing was afraid he wouldn't wake up.   Last dialysis was Wednesday. Net 4.4L and left 1.8kg over dry weight.   Objective Vitals:   08/14/20 1307 08/14/20 1315 08/14/20 1330 08/14/20 1400  BP: (!) 163/85 (!) 154/88 (!) 159/90 (!) 141/81  Pulse: 69 71 71 71  Resp: 12 12 13 18   Temp:      TempSrc:      SpO2: 100% 100% 100% 100%  Weight:      Height:         Additional Objective Labs: Basic Metabolic Panel: Recent Labs  Lab 08/14/20 1024  NA 135  K 4.5  CL 95*  CO2 25  GLUCOSE 395*  BUN 56*  CREATININE 9.56*  CALCIUM 8.3*   CBC: Recent Labs  Lab 08/14/20 1024  WBC 6.4  NEUTROABS 5.1  HGB 7.9*  HCT 25.3*  MCV 83.5  PLT 358   Blood Culture    Component Value Date/Time   SDES BLOOD LEFT ANTECUBITAL 06/17/2019 2011   [place]  06/17/2019 2011    BOTTLES DRAWN AEROBIC AND ANAEROBIC Blood Culture adequate volume   CULT  06/17/2019 2011    NO GROWTH 5 DAYS Performed at [facility], [place] [address], [place], [place] [address]    REPTSTATUS 06/22/2019 FINAL 06/17/2019 2011     Physical Exam General: Nontoxic appearing, hoarse voice  Heart: RRR  Lungs: Coarse breath sounds/rhonchi throughout  Abdomen: soft non-tender  Extremities: No sig LE edema  Dialysis Access: RUE AVF +bruit    Dialysis Orders:  [facility] MWF 4h 450/600 EDW  77kg  2K/2.25 Ca  R AVF No heparin Mircera 225 q 2 weeks (last 2/14) Venofer 100 x6 (until 2/25)  Hectorol 6 TIW   Assessment/Plan: 1. Dyspnea/Pharyngitis. IV antibiotics/steroids ordered in ED.  May be some component of volume contributing. HD tonight for volume removal.  2. ESRD - HD MWF. Will write orders for HD today.  3. HTN/volume- BP elevated on admission. Does appear to have some volume excess on exam. UF 3L on HD  4. Anemia: H/o GI bleeding. On high dose ESA, iron as outpatient. Follow trends here.  5. MBD/CKD -  Corr Ca ok. Continue Hectorol/Auryxia binder  6. Nutrition - Renal diet/fluid restrictions   Lynnda Child PA-C [facility] 08/14/2020,3:15 PM
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
## 2020-08-14 NOTE — ED Notes (Signed)
Tamala Julian, MD at bedside. Pt took a sip of water to take pills. Unable to tolerate PO liquids. PT NPO, including ice chips per provider. Hold all PO meds at this time.

## 2020-08-14 NOTE — ED Notes (Signed)
Report called, waiting for room to be cleaned.
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
## 2020-08-14 NOTE — ED Triage Notes (Signed)
Pt arrives to ED with chief complaint of difficultly breathing, cough and swelling in airway that has been ongoing and worse since being intubated following a cardiac arrest 3 weeks ago and was admitted to this hospital.  He states recently he feels like throat is so swollen he has trouble swallowing water.

## 2020-08-14 NOTE — ED Notes (Signed)
Called dietary due to not receiving tray yet, they are going to order another.

## 2020-08-14 NOTE — ED Notes (Signed)
Received call from floor stating room was clean, transport requested.

## 2020-08-14 NOTE — ED Notes (Signed)
Pt requesting something to eat despite issues with swallowing and the potential for aspiration. Pt was informed of current NPO status and reasons for that diet status. Pt requesting to speak with MD. Tamala Julian, MD notified via chat. Tamala Julian, MD is ok with diet being changed back renal and carb modified diet if the pt understands and is ok with possibility of aspiration to occur if he eats. Pt acknowledges an understanding of aspiration risks and still wants to have something to eat. Diet order changed. Dietary called to order dinner tray.
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
## 2020-08-14 NOTE — ED Notes (Addendum)
Diet tray arrived. Patient feeding self and swallowing without issue at this time.

## 2020-08-14 NOTE — ED Notes (Signed)
Dinner Order Placed 

## 2020-08-14 NOTE — ED Notes (Signed)
Attempted to call report, no answer on unit at this time.

## 2020-08-14 NOTE — ED Notes (Signed)
Called and spoke with dietary who confirmed dinner tray has been sent.

## 2020-08-15 ENCOUNTER — Encounter (HOSPITAL_COMMUNITY): Payer: Self-pay | Admitting: Internal Medicine

## 2020-08-15 ENCOUNTER — Inpatient Hospital Stay (HOSPITAL_COMMUNITY): Payer: Medicare Other

## 2020-08-15 DIAGNOSIS — E1165 Type 2 diabetes mellitus with hyperglycemia: Secondary | ICD-10-CM

## 2020-08-15 DIAGNOSIS — J3800 Paralysis of vocal cords and larynx, unspecified: Secondary | ICD-10-CM | POA: Diagnosis not present

## 2020-08-15 DIAGNOSIS — R1310 Dysphagia, unspecified: Secondary | ICD-10-CM | POA: Diagnosis not present

## 2020-08-15 DIAGNOSIS — J690 Pneumonitis due to inhalation of food and vomit: Secondary | ICD-10-CM

## 2020-08-15 LAB — CBC
HCT: 23.7 % — ABNORMAL LOW (ref 39.0–52.0)
Hemoglobin: 7.7 g/dL — ABNORMAL LOW (ref 13.0–17.0)
MCH: 26.1 pg (ref 26.0–34.0)
MCHC: 32.5 g/dL (ref 30.0–36.0)
MCV: 80.3 fL (ref 80.0–100.0)
Platelets: 384 10*3/uL (ref 150–400)
RBC: 2.95 MIL/uL — ABNORMAL LOW (ref 4.22–5.81)
RDW: 20 % — ABNORMAL HIGH (ref 11.5–15.5)
WBC: 7.1 10*3/uL (ref 4.0–10.5)
nRBC: 0.6 % — ABNORMAL HIGH (ref 0.0–0.2)

## 2020-08-15 LAB — RENAL FUNCTION PANEL
Albumin: 2.6 g/dL — ABNORMAL LOW (ref 3.5–5.0)
Anion gap: 11 (ref 5–15)
BUN: 29 mg/dL — ABNORMAL HIGH (ref 6–20)
CO2: 29 mmol/L (ref 22–32)
Calcium: 8.6 mg/dL — ABNORMAL LOW (ref 8.9–10.3)
Chloride: 97 mmol/L — ABNORMAL LOW (ref 98–111)
Creatinine, Ser: 6.46 mg/dL — ABNORMAL HIGH (ref 0.61–1.24)
GFR, Estimated: 9 mL/min — ABNORMAL LOW (ref >60)
Glucose, Bld: 140 mg/dL — ABNORMAL HIGH (ref 70–99)
Phosphorus: 3.8 mg/dL (ref 2.5–4.6)
Potassium: 3.4 mmol/L — ABNORMAL LOW (ref 3.5–5.1)
Sodium: 137 mmol/L (ref 135–145)

## 2020-08-15 LAB — GLUCOSE, CAPILLARY
Glucose-Capillary: 172 mg/dL — ABNORMAL HIGH (ref 70–99)
Glucose-Capillary: 145 mg/dL — ABNORMAL HIGH (ref 70–99)
Glucose-Capillary: 169 mg/dL — ABNORMAL HIGH (ref 70–99)
Glucose-Capillary: 92 mg/dL (ref 70–99)
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
## 2020-08-15 NOTE — Plan of Care (Signed)

## 2020-08-15 NOTE — Progress Notes (Signed)
[facility] PROGRESS NOTE   Jeremiah [name] SAY:[id number] DOB: 1965/08/02 DOA: 08/14/2020  PCP: Maximiano Coss, NP  Brief History/Interval Summary: 55 y.o. male with medical history significant of ESRD on HD (MWF), hypertension, uncontrolled diabetes mellitus type 2, GERD, GI bleed secondary to duodenal ulcer/gastritis and syncope who presented with complaints of throat pain and shortness of breath.  Recent hospital admissions include 1/24-1/29 for GI bleed with subsequent PEA arrest requiring intubation, and 2/10-2/11 for suspected neurocardiogenic syncope.  Patient reports having severe throat discomfort since requiring intubation approximately 3 weeks ago.    In the emergency department he was noted to be afebrile.  Chest x-ray was noted to have a right lower lobe opacity similar to previous films.  CT scan of the neck was subsequently done which showed supraglottic laryngeal inflammation.  ENT was consulted.  Patient was placed on antibiotics.  He was hospitalized.   Consultants: Nephrology.  ENT  Procedures: Hemodialysis  Antibiotics: Anti-infectives (From admission, onward)   Start     Dose/Rate Route Frequency Ordered Stop   08/15/20 1200  cefTRIAXone (ROCEPHIN) 1 g in sodium chloride 0.9 % 100 mL IVPB        1 g 200 mL/hr over 30 Minutes Intravenous Every 24 hours 08/14/20 1735     08/15/20 1200  azithromycin (ZITHROMAX) 500 mg in sodium chloride 0.9 % 250 mL IVPB        500 mg 250 mL/hr over 60 Minutes Intravenous Every 24 hours 08/14/20 1735     08/14/20 1200  cefTRIAXone (ROCEPHIN) 1 g in sodium chloride 0.9 % 100 mL IVPB        1 g 200 mL/hr over 30 Minutes Intravenous  Once 08/14/20 1150 08/14/20 1354   08/14/20 1200  azithromycin (ZITHROMAX) 500 mg in sodium chloride 0.9 % 250 mL IVPB        500 mg 250 mL/hr over 60 Minutes Intravenous  Once 08/14/20 1150 08/14/20 1502      Subjective/Interval History: Patient states that his throat is feeling slightly  better today compared to yesterday.  Denies any other symptoms including shortness of breath nausea or vomiting.     Assessment/Plan:  Vocal cord paralysis/dysphagia/supraglottic laryngeal inflammation CT scan did not show any abscess or masses.  Patient seen by ENT.  No indication for surgical intervention currently.  Patient waiting on evaluation by speech therapy since he is at high risk for aspiration due to his vocal cord paralysis.  Patient also started on ceftriaxone and azithromycin due to concern for infection which will be continued for now.  Discussed with Dr. Constance Holster with ENT.  End-stage renal disease on hemodialysis He is on a Monday Wednesday Friday schedule.  There was some concern for fluid overload yesterday.  Patient was dialyzed.  Nephrology is following.  Diabetes mellitus type 2, uncontrolled with hyperglycemia Patient currently on Lantus and SSI.  CBGs were quite elevated yesterday but seems to be better this morning.  Continue to monitor.  HbA1c 10.7 in January.  Accelerated hypertension Noted to have significantly elevated blood pressure with systolics in the 235T.  After dialysis this has improved.  He was also started back on his home medication regimen including hydralazine, Isordil, carvedilol and losartan.  Iron deficiency anemia No evidence of overt bleeding.  Continue iron supplements.  Hemoglobin is stable.  Recent GI bleed secondary to duodenal ulcer/GERD Continue PPI.  DVT Prophylaxis: Use SCDs for now Code Status: Full code Family Communication: Discussed with the patient Disposition Plan: Hopefully  return home when improved  Status is: Inpatient  Remains inpatient appropriate because:IV treatments appropriate due to intensity of illness or inability to take PO and Inpatient level of care appropriate due to severity of illness   Dispo: The patient is from: Home              Anticipated d/c is to: Home              Anticipated d/c date is: 1 day               Patient currently is not medically stable to d/c.   Difficult to place patient No     Medications:  Scheduled: . carvedilol  25 mg Oral BID WC  . Chlorhexidine Gluconate Cloth  6 each Topical Q0600  . ferric citrate  630 mg Oral TID WC  . heparin  5,000 Units Subcutaneous Q8H  . hydrALAZINE  100 mg Oral TID  . insulin aspart  0-9 Units Subcutaneous TID WC  . insulin glargine  20 Units Subcutaneous Daily  . isosorbide dinitrate  30 mg Oral TID  . losartan  100 mg Oral Daily  . melatonin  5 mg Oral QHS  . multivitamin  1 tablet Oral TID WC  . pantoprazole  40 mg Oral Daily  . sodium chloride flush  3 mL Intravenous Q12H   Continuous: . azithromycin    . cefTRIAXone (ROCEPHIN)  IV     PYK:[id number] **OR** acetaminophen, albuterol, hydrALAZINE, phenol   Objective:  Vital Signs  Vitals:   08/15/20 0338 08/15/20 0340 08/15/20 0520 08/15/20 0728  BP: (!) 160/82 (!) 160/82 (!) 147/79 (!) 145/68  Pulse: 92 92 94 75  Resp: 20 15 20 15   Temp:  98.4 F (36.9 C) 98 F (36.7 C) 98.5 F (36.9 C)  TempSrc:  Oral Oral Oral  SpO2: 95% 95% 100% 99%  Weight:      Height:        Intake/Output Summary (Last 24 hours) at 08/15/2020 1104 Last data filed at 08/15/2020 0340 Gross per 24 hour  Intake 350 ml  Output 3500 ml  Net -3150 ml   Filed Weights   08/14/20 1107  Weight: 79.4 kg    General appearance: Awake alert.  In no distress Resp: Clear to auscultation bilaterally.  Normal effort Cardio: S1-S2 is normal regular.  No S3-S4.  No rubs murmurs or bruit GI: Abdomen is soft.  Nontender nondistended.  Bowel sounds are present normal.  No masses organomegaly Alert and oriented x3.  No focal neurological deficits    Lab Results:  Data Reviewed: I have personally reviewed following labs and imaging studies  CBC: Recent Labs  Lab 08/14/20 1024 08/15/20 0632  WBC 6.4 7.1  NEUTROABS 5.1  --   HGB 7.9* 7.7*  HCT 25.3* 23.7*  MCV 83.5 80.3  PLT 358 384     Basic Metabolic Panel: Recent Labs  Lab 08/14/20 1024 08/15/20 0632  NA 135 137  K 4.5 3.4*  CL 95* 97*  CO2 25 29  GLUCOSE 395* 140*  BUN 56* 29*  CREATININE 9.56* 6.46*  CALCIUM 8.3* 8.6*  PHOS  --  3.8    GFR: Estimated Creatinine Clearance: 13.8 mL/min (A) (by C-G formula based on SCr of 6.46 mg/dL (H)).  Liver Function Tests: Recent Labs  Lab 08/14/20 1024 08/15/20 0632  AST 35  --   ALT 65*  --   ALKPHOS 175*  --   BILITOT 1.1  --  PROT 6.8  --   ALBUMIN 3.0* 2.6*    CBG: Recent Labs  Lab 08/14/20 1628 08/14/20 1811 08/15/20 0736  GLUCAP 247* 226* 172*     Recent Results (from the past 240 hour(s))  Resp Panel by RT-PCR (Flu A&B, Covid) Nasopharyngeal Swab     Status: None   Collection Time: 08/06/20  8:29 AM   Specimen: Nasopharyngeal Swab; Nasopharyngeal(NP) swabs in vial transport medium  Result Value Ref Range Status   SARS Coronavirus 2 by RT PCR NEGATIVE NEGATIVE Final    Comment: (NOTE) SARS-CoV-2 target nucleic acids are NOT DETECTED.  The SARS-CoV-2 RNA is generally detectable in upper respiratory specimens during the acute phase of infection. The lowest concentration of SARS-CoV-2 viral copies this assay can detect is 138 copies/mL. A negative result does not preclude SARS-Cov-2 infection and should not be used as the sole basis for treatment or other patient management decisions. A negative result may occur with  improper specimen collection/handling, submission of specimen other than nasopharyngeal swab, presence of viral mutation(s) within the areas targeted by this assay, and inadequate number of viral copies(<138 copies/mL). A negative result must be combined with clinical observations, patient history, and epidemiological information. The expected result is Negative.  Fact Sheet for Patients:  EntrepreneurPulse.com.au  Fact Sheet for Healthcare Providers:   IncredibleEmployment.be  This test is no t yet approved or cleared by the [place] FDA and  has been authorized for detection and/or diagnosis of SARS-CoV-2 by FDA under an Emergency Use Authorization (EUA). This EUA will remain  in effect (meaning this test can be used) for the duration of the COVID-19 declaration under Section 564(b)(1) of the Act, 21 U.S.C.section 360bbb-3(b)(1), unless the authorization is terminated  or revoked sooner.       Influenza A by PCR NEGATIVE NEGATIVE Final   Influenza B by PCR NEGATIVE NEGATIVE Final    Comment: (NOTE) The Xpert Xpress SARS-CoV-2/FLU/RSV plus assay is intended as an aid in the diagnosis of influenza from Nasopharyngeal swab specimens and should not be used as a sole basis for treatment. Nasal washings and aspirates are unacceptable for Xpert Xpress SARS-CoV-2/FLU/RSV testing.  Fact Sheet for Patients: EntrepreneurPulse.com.au  Fact Sheet for Healthcare Providers: IncredibleEmployment.be  This test is not yet approved or cleared by the [place] FDA and has been authorized for detection and/or diagnosis of SARS-CoV-2 by FDA under an Emergency Use Authorization (EUA). This EUA will remain in effect (meaning this test can be used) for the duration of the COVID-19 declaration under Section 564(b)(1) of the Act, 21 U.S.C. section 360bbb-3(b)(1), unless the authorization is terminated or revoked.  Performed at [facility], [place] [address], [place], [place] [address]   Resp Panel by RT-PCR (Flu A&B, Covid) Nasopharyngeal Swab     Status: None   Collection Time: 08/14/20 11:12 AM   Specimen: Nasopharyngeal Swab; Nasopharyngeal(NP) swabs in vial transport medium  Result Value Ref Range Status   SARS Coronavirus 2 by RT PCR NEGATIVE NEGATIVE Final    Comment: (NOTE) SARS-CoV-2 target nucleic acids are NOT DETECTED.  The SARS-CoV-2 RNA is generally detectable in  upper respiratory specimens during the acute phase of infection. The lowest concentration of SARS-CoV-2 viral copies this assay can detect is 138 copies/mL. A negative result does not preclude SARS-Cov-2 infection and should not be used as the sole basis for treatment or other patient management decisions. A negative result may occur with  improper specimen collection/handling, submission of specimen other than nasopharyngeal  swab, presence of viral mutation(s) within the areas targeted by this assay, and inadequate number of viral copies(<138 copies/mL). A negative result must be combined with clinical observations, patient history, and epidemiological information. The expected result is Negative.  Fact Sheet for Patients:  EntrepreneurPulse.com.au  Fact Sheet for Healthcare Providers:  IncredibleEmployment.be  This test is no t yet approved or cleared by the [place] FDA and  has been authorized for detection and/or diagnosis of SARS-CoV-2 by FDA under an Emergency Use Authorization (EUA). This EUA will remain  in effect (meaning this test can be used) for the duration of the COVID-19 declaration under Section 564(b)(1) of the Act, 21 U.S.C.section 360bbb-3(b)(1), unless the authorization is terminated  or revoked sooner.       Influenza A by PCR NEGATIVE NEGATIVE Final   Influenza B by PCR NEGATIVE NEGATIVE Final    Comment: (NOTE) The Xpert Xpress SARS-CoV-2/FLU/RSV plus assay is intended as an aid in the diagnosis of influenza from Nasopharyngeal swab specimens and should not be used as a sole basis for treatment. Nasal washings and aspirates are unacceptable for Xpert Xpress SARS-CoV-2/FLU/RSV testing.  Fact Sheet for Patients: EntrepreneurPulse.com.au  Fact Sheet for Healthcare Providers: IncredibleEmployment.be  This test is not yet approved or cleared by the [place] FDA and has been  authorized for detection and/or diagnosis of SARS-CoV-2 by FDA under an Emergency Use Authorization (EUA). This EUA will remain in effect (meaning this test can be used) for the duration of the COVID-19 declaration under Section 564(b)(1) of the Act, 21 U.S.C. section 360bbb-3(b)(1), unless the authorization is terminated or revoked.  Performed at [facility], [place] [address], [place], [place] [address]       Radiology Studies: DG Chest 2 View  Result Date: 08/14/2020 CLINICAL DATA:  Shortness of breath with cough EXAM: CHEST - 2 VIEW COMPARISON:  August 06, 2020 FINDINGS: There is ill-defined opacity in the right lower lung region, similar to recent prior study. Lungs elsewhere clear. There is cardiomegaly with pulmonary vascularity within normal limits. No adenopathy. No bone lesions. Probable osteophyte on the right in the midthoracic region, stable. IMPRESSION: Ill-defined airspace opacity right lower lobe region concerning for pneumonia. Atypical organism pneumonia could present in this manner. No consolidation in this area. No new opacity evident. Stable cardiomegaly.  No adenopathy evident. Electronically Signed   By: Lowella Grip III M.D.   On: 08/14/2020 10:55   CT Soft Tissue Neck W Contrast  Result Date: 08/14/2020 CLINICAL DATA:  Laryngeal edema, recent intubation, assess for subglottic stenosis EXAM: CT NECK WITH CONTRAST TECHNIQUE: Multidetector CT imaging of the neck was performed using the standard protocol following the bolus administration of intravenous contrast. CONTRAST:  47mL OMNIPAQUE IOHEXOL 300 MG/ML  SOLN COMPARISON:  None. FINDINGS: Pharynx and larynx: Soft tissue swelling at the level of supraglottic larynx. Subglottic airway is patent. Airway is patent with luminal narrowing at the time of imaging. Salivary glands: Unremarkable. Thyroid: Normal. Lymph nodes: No enlarged nodes. Vascular: Major neck vessels are patent. Mild calcified plaque at the ICA  origins. Limited intracranial: No abnormal enhancement. Visualized orbits: Unremarkable. Mastoids and visualized paranasal sinuses: Visualized right sphenoid sinus is opacified. Mastoid air cells are clear. Skeleton: Cervical spine degenerative changes primarily at C5-C6. Upper chest: Included upper lungs are clear. Other: None. IMPRESSION: Supraglottic larynx soft tissue swelling.  Patent subglottic airway. Electronically Signed   By: Macy Mis M.D.   On: 08/14/2020 14:36       LOS:  1 day   [facility] Pager on [email or web address]  08/15/2020, 11:04 AM

## 2020-08-15 NOTE — Progress Notes (Signed)
Patient ID: Jeremiah [name], male   DOB: Jan 31, 1966, 55 y.o.   MRN: [id number] [facility] Progress Note   Assessment/ Plan:   1.  Dyspnea/pharyngitis/dysphagia: Seen by ENT surgery yesterday with a diagnosis made of right-sided unilateral cord paralysis possibly related to recent intubation.  Awaiting SLP evaluation for dysphagia. 2. ESRD: Underwent hemodialysis yesterday with aggressive ultrafiltration-tolerated 3.5 L fluid removal and denies any shortness of breath at this time.  We will continue to follow closely over the weekend for acute dialysis need and maintain him on a MWF schedule. 3. Anemia: With low hemoglobin and hematocrit and a background history of recent GI bleed.  He remains on high-dose ESA with intermittent intravenous iron supplementation 4. CKD-MBD: Calcium and phosphorus level currently at goal, continue Hectorol for PTH suppression and Auryxia for phosphorus binding. 5. Nutrition: Resume renal diet when permitted by SLP following swallow evaluation. 6. Hypertension: Blood pressures are marginally elevated and appear to have improved with UF.  Subjective:   Without acute event overnight, feeling tired this morning.   Objective:   BP (!) 145/68 (BP Location: Left Arm)   Pulse 75   Temp 98.5 F (36.9 C) (Oral)   Resp 15   Ht 5\' 11"  (1.803 m)   Wt 79.4 kg Comment: dry wt  SpO2 99%   BMI 24.41 kg/m   Physical Exam: Gen: Sleeping, resting comfortably in bed CVS: Pulse regular rhythm, normal rate, S1 and S2 normal Resp: Coarse/transmitted breath sounds bilaterally without any distinct rales or wheeze Abd: Soft, flat, nontender, bowel sounds normal Ext: No lower extremity edema, right upper arm AV fistula with intact dressings  Labs: BMET Recent Labs  Lab 08/14/20 1024 08/15/20 0632  NA 135 137  K 4.5 3.4*  CL 95* 97*  CO2 25 29  GLUCOSE 395* 140*  BUN 56* 29*  CREATININE 9.56* 6.46*  CALCIUM 8.3* 8.6*  PHOS  --  3.8   CBC Recent  Labs  Lab 08/14/20 1024 08/15/20 0632  WBC 6.4 7.1  NEUTROABS 5.1  --   HGB 7.9* 7.7*  HCT 25.3* 23.7*  MCV 83.5 80.3  PLT 358 384     Medications:    . carvedilol  25 mg Oral BID WC  . Chlorhexidine Gluconate Cloth  6 each Topical Q0600  . ferric citrate  630 mg Oral TID WC  . heparin  5,000 Units Subcutaneous Q8H  . hydrALAZINE  100 mg Oral TID  . insulin aspart  0-9 Units Subcutaneous TID WC  . insulin glargine  20 Units Subcutaneous Daily  . isosorbide dinitrate  30 mg Oral TID  . losartan  100 mg Oral Daily  . melatonin  5 mg Oral QHS  . multivitamin  1 tablet Oral TID WC  . pantoprazole  40 mg Oral Daily  . sodium chloride flush  3 mL Intravenous Q12H   Elmarie Shiley, MD 08/15/2020, 8:23 AM

## 2020-08-15 NOTE — Progress Notes (Addendum)
Modified Barium Swallow Progress Note  Patient Details  Name: Jeremiah [name] MRN: [id number] Date of Birth: September 25, 1965  Today's Date: 08/15/2020  Modified Barium Swallow completed.  Full report located under Chart Review in the Imaging Section.  Brief recommendations include the following:  Clinical Impression  Mild pharyngeal dysphagia characterized by decreased laryngeal closure, vocal cord paresis per ENT,  allowing mild laryngeal penetration of thin liquids in approx 3/7 swallows.  Cues to clear his throat cleared trace penetration 90% of opportunities and pt with a single episode of reflexive throat clearing.  Pharyngeal swallow is strong without retention.  Various postures including chin tuck and head turn right *right vocal fold paresis* did not prevent penetration and worsened penetration with chin tuck posture.     Recommend pt continue diet and implement occasional throat clear after few swallows to clear larynx.  Of note, pt consumed water following exam without indication of aspiration.    Despite SLP's best efforts to faciliate mild aspiration via instructing pt to consume large sequential boluses - aspiration did not occur.  Pt also admits to vomiting prior to admission - causing SLP to question if this could contribute to his pulmonary issues.    Using teach back, video feedback and written handout for instruction - education completed.  Will follow up to provide pt with RMST to improve laryngeal closure/airway protection with po intake.  Thanks for this consult.   Swallow Evaluation Recommendations       SLP Diet Recommendations: Regular solids;Thin liquid   Liquid Administration via: Cup;Straw   Medication Administration: Other (Comment) (as tolerated)   Supervision: Intermittent supervision to cue for compensatory strategies   Compensations: Slow rate;Small sips/bites;Clear throat intermittently   Postural Changes: Seated upright at 90 degrees;Remain  semi-upright after after feeds/meals (Comment)   Oral Care Recommendations: Oral care BID      Kathleen Lime, MS [facility] SLP Acute Rehab Services Office [phone] Pager [phone]    Macario Golds 08/15/2020,2:53 PM

## 2020-08-16 DIAGNOSIS — N186 End stage renal disease: Secondary | ICD-10-CM

## 2020-08-16 NOTE — Discharge Summary (Signed)
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         Height:       71.0 in Accession #:    [id number]        Weight:       173.1 lb Date of Birth:  1965-11-20        BSA:          1.983 m Patient Age:    48 years          BP:           150/92 mmHg Patient Gender: M                 HR:           103 bpm. Exam Location:  Inpatient Procedure: Limited Echo Indications:     Syncope  History:         Patient has prior history of Echocardiogram examinations, most  recent 07/21/2020. Risk Factors:Hypertension, Diabetes and                  Dyslipidemia.  Sonographer:     Clayton Lefort RDCS (AE) Referring Phys:  [place] Diagnosing Phys: Adrian Prows MD IMPRESSIONS  1. Left ventricular ejection fraction, by estimation, is 50 to 55%. The left ventricle has normal function. The left ventricle has no regional wall motion abnormalities. There is severe left ventricular hypertrophy. Left ventricular diastolic function could not be evaluated.  2. Right ventricular systolic function is normal. The right ventricular size is  normal.  3. Left atrial size was mildly dilated.  4. The mitral valve is normal in structure. No evidence of mitral stenosis.  5. The aortic valve is normal in structure. No aortic stenosis is present.  6. The inferior vena cava is normal in size with greater than 50% respiratory variability, suggesting right atrial pressure of 3 mmHg. FINDINGS  Left Ventricle: Speckled pattern, consider infiltrative heart disease. Left ventricular ejection fraction, by estimation, is 50 to 55%. The left ventricle has normal function. The left ventricle has no regional wall motion abnormalities. The left ventricular internal cavity size was normal in size. There is severe left ventricular hypertrophy. Left ventricular diastolic function could not be evaluated. Right Ventricle: The right ventricular size is normal. No increase in right ventricular wall thickness. Right ventricular systolic function is normal. Left Atrium: Left atrial size was mildly dilated. Right Atrium: Right atrial size was normal in size. Pericardium: There is no evidence of pericardial effusion. Mitral Valve: The mitral valve is normal in structure. No evidence of mitral valve stenosis. Tricuspid Valve: The tricuspid valve is normal in structure. No evidence of tricuspid stenosis. Aortic Valve: The aortic valve is normal in structure. No aortic stenosis is present. Pulmonic Valve: The pulmonic valve was normal in structure. No evidence of pulmonic stenosis. Aorta: The aortic root is normal in size and structure. Venous: The inferior vena cava is normal in size with greater than 50% respiratory variability, suggesting right atrial pressure of 3 mmHg. IAS/Shunts: The interatrial septum was not assessed. LEFT VENTRICLE PLAX 2D LVIDd:         4.90 cm LVIDs:         3.70 cm LV PW:         1.80 cm LV IVS:        1.70 cm LVOT diam:     2.20 cm LVOT Area:     3.80 cm  LEFT ATRIUM         Index LA diam:    4.60 cm 2.32 cm/m   AORTA Ao Root diam: 3.10 cm  SHUNTS  Systemic Diam: 2.20 cm Adrian Prows MD Electronically signed by Adrian Prows MD Signature Date/Time: 08/07/2020/6:24:24 PM    Final    Bonnielee Haff   PATIENT LEFT AGAINST MEDICAL ADVICE   [facility] Pager on [email or web address]  08/16/2020, 11:07 AM

## 2020-08-16 NOTE — Progress Notes (Addendum)
Patient called in Nurse to room to inform that he is leaving, ready to go, he removed his IV and telemetry leads off. Encourage patient to stay and educated but patient is adamant to go. MD Hospitalist Darrell Jewel and Charge Nurse made aware. Patient left unit at [facility]

## 2020-08-17 ENCOUNTER — Telehealth: Payer: Self-pay | Admitting: *Deleted

## 2020-08-17 NOTE — Telephone Encounter (Signed)
Error

## 2020-08-20 ENCOUNTER — Ambulatory Visit: Payer: Medicare Other | Admitting: Student

## 2020-08-20 ENCOUNTER — Encounter: Payer: Self-pay | Admitting: Student

## 2020-08-20 ENCOUNTER — Other Ambulatory Visit: Payer: Self-pay

## 2020-08-20 VITALS — BP 200/108 | HR 89 | Temp 97.2°F | Ht 71.5 in | Wt 184.0 lb

## 2020-08-20 DIAGNOSIS — I5181 Takotsubo syndrome: Secondary | ICD-10-CM

## 2020-08-20 DIAGNOSIS — I10 Essential (primary) hypertension: Secondary | ICD-10-CM

## 2020-08-20 DIAGNOSIS — Z992 Dependence on renal dialysis: Secondary | ICD-10-CM

## 2020-08-20 DIAGNOSIS — N186 End stage renal disease: Secondary | ICD-10-CM

## 2020-08-20 MED ORDER — ISOSORBIDE DINITRATE 30 MG PO TABS: 30.0000 mg | ORAL_TABLET | Freq: Three times a day (TID) | ORAL | 3 refills | Status: AC

## 2020-08-20 MED ORDER — LOSARTAN POTASSIUM 100 MG PO TABS: 100.0000 mg | ORAL_TABLET | Freq: Every day | ORAL | 3 refills | Status: AC

## 2020-08-20 MED ORDER — CARVEDILOL 25 MG PO TABS
25.0000 mg | ORAL_TABLET | Freq: Two times a day (BID) | ORAL | 3 refills | Status: AC
Start: 2020-08-20 — End: ?

## 2020-08-20 MED ORDER — MELATONIN 5 MG PO TABS: 5.0000 mg | ORAL_TABLET | Freq: Every day | ORAL | 3 refills | Status: AC

## 2020-08-20 MED ORDER — HYDRALAZINE HCL 100 MG PO TABS: 100.0000 mg | ORAL_TABLET | Freq: Three times a day (TID) | ORAL | 3 refills | Status: AC

## 2020-08-20 NOTE — Patient Instructions (Addendum)
Pick up melatonin over the counter to hep with sleep. Should be taking pantoprazole. Schedule follow up with Dr. Benson Norway and verify you should be taking it.
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
## 2020-09-14 ENCOUNTER — Inpatient Hospital Stay (HOSPITAL_COMMUNITY): Payer: Medicare Other

## 2020-09-14 ENCOUNTER — Inpatient Hospital Stay (HOSPITAL_COMMUNITY)
Admission: EM | Admit: 2020-09-14 | Discharge: 2020-09-25 | DRG: 296 | Disposition: E | Payer: Medicare Other | Attending: Pulmonary Disease | Admitting: Pulmonary Disease

## 2020-09-14 ENCOUNTER — Emergency Department (HOSPITAL_COMMUNITY): Payer: Medicare Other

## 2020-09-14 ENCOUNTER — Other Ambulatory Visit: Payer: Self-pay

## 2020-09-14 ENCOUNTER — Encounter (HOSPITAL_COMMUNITY): Payer: Self-pay | Admitting: *Deleted

## 2020-09-14 DIAGNOSIS — D649 Anemia, unspecified: Secondary | ICD-10-CM | POA: Diagnosis present

## 2020-09-14 DIAGNOSIS — J9601 Acute respiratory failure with hypoxia: Secondary | ICD-10-CM | POA: Diagnosis present

## 2020-09-14 DIAGNOSIS — Z992 Dependence on renal dialysis: Secondary | ICD-10-CM | POA: Diagnosis not present

## 2020-09-14 DIAGNOSIS — R404 Transient alteration of awareness: Secondary | ICD-10-CM | POA: Diagnosis present

## 2020-09-14 DIAGNOSIS — Z833 Family history of diabetes mellitus: Secondary | ICD-10-CM

## 2020-09-14 DIAGNOSIS — E1122 Type 2 diabetes mellitus with diabetic chronic kidney disease: Secondary | ICD-10-CM | POA: Diagnosis present

## 2020-09-14 DIAGNOSIS — Z794 Long term (current) use of insulin: Secondary | ICD-10-CM

## 2020-09-14 DIAGNOSIS — Z8673 Personal history of transient ischemic attack (TIA), and cerebral infarction without residual deficits: Secondary | ICD-10-CM

## 2020-09-14 DIAGNOSIS — Z823 Family history of stroke: Secondary | ICD-10-CM

## 2020-09-14 DIAGNOSIS — G936 Cerebral edema: Secondary | ICD-10-CM | POA: Diagnosis present

## 2020-09-14 DIAGNOSIS — N186 End stage renal disease: Secondary | ICD-10-CM | POA: Diagnosis present

## 2020-09-14 DIAGNOSIS — G8929 Other chronic pain: Secondary | ICD-10-CM | POA: Diagnosis present

## 2020-09-14 DIAGNOSIS — M1990 Unspecified osteoarthritis, unspecified site: Secondary | ICD-10-CM | POA: Diagnosis present

## 2020-09-14 DIAGNOSIS — Z8249 Family history of ischemic heart disease and other diseases of the circulatory system: Secondary | ICD-10-CM

## 2020-09-14 DIAGNOSIS — E1165 Type 2 diabetes mellitus with hyperglycemia: Secondary | ICD-10-CM | POA: Diagnosis present

## 2020-09-14 DIAGNOSIS — Z515 Encounter for palliative care: Secondary | ICD-10-CM | POA: Diagnosis not present

## 2020-09-14 DIAGNOSIS — G935 Compression of brain: Secondary | ICD-10-CM | POA: Diagnosis present

## 2020-09-14 DIAGNOSIS — Z20822 Contact with and (suspected) exposure to covid-19: Secondary | ICD-10-CM | POA: Diagnosis present

## 2020-09-14 DIAGNOSIS — E1140 Type 2 diabetes mellitus with diabetic neuropathy, unspecified: Secondary | ICD-10-CM | POA: Diagnosis present

## 2020-09-14 DIAGNOSIS — G931 Anoxic brain damage, not elsewhere classified: Secondary | ICD-10-CM | POA: Diagnosis present

## 2020-09-14 DIAGNOSIS — I462 Cardiac arrest due to underlying cardiac condition: Secondary | ICD-10-CM

## 2020-09-14 DIAGNOSIS — Z0189 Encounter for other specified special examinations: Secondary | ICD-10-CM

## 2020-09-14 DIAGNOSIS — F32A Depression, unspecified: Secondary | ICD-10-CM | POA: Diagnosis present

## 2020-09-14 DIAGNOSIS — Z79899 Other long term (current) drug therapy: Secondary | ICD-10-CM

## 2020-09-14 DIAGNOSIS — Z83438 Family history of other disorder of lipoprotein metabolism and other lipidemia: Secondary | ICD-10-CM

## 2020-09-14 DIAGNOSIS — R578 Other shock: Secondary | ICD-10-CM | POA: Diagnosis present

## 2020-09-14 DIAGNOSIS — I469 Cardiac arrest, cause unspecified: Secondary | ICD-10-CM | POA: Diagnosis present

## 2020-09-14 DIAGNOSIS — R55 Syncope and collapse: Secondary | ICD-10-CM | POA: Diagnosis present

## 2020-09-14 DIAGNOSIS — Z66 Do not resuscitate: Secondary | ICD-10-CM | POA: Diagnosis not present

## 2020-09-14 DIAGNOSIS — E11649 Type 2 diabetes mellitus with hypoglycemia without coma: Secondary | ICD-10-CM | POA: Diagnosis not present

## 2020-09-14 DIAGNOSIS — T68XXXA Hypothermia, initial encounter: Secondary | ICD-10-CM | POA: Diagnosis present

## 2020-09-14 DIAGNOSIS — G9382 Brain death: Secondary | ICD-10-CM | POA: Diagnosis not present

## 2020-09-14 DIAGNOSIS — G928 Other toxic encephalopathy: Secondary | ICD-10-CM | POA: Diagnosis present

## 2020-09-14 DIAGNOSIS — E8770 Fluid overload, unspecified: Secondary | ICD-10-CM | POA: Diagnosis present

## 2020-09-14 DIAGNOSIS — I120 Hypertensive chronic kidney disease with stage 5 chronic kidney disease or end stage renal disease: Secondary | ICD-10-CM | POA: Diagnosis present

## 2020-09-14 DIAGNOSIS — J810 Acute pulmonary edema: Secondary | ICD-10-CM | POA: Diagnosis present

## 2020-09-14 DIAGNOSIS — E11319 Type 2 diabetes mellitus with unspecified diabetic retinopathy without macular edema: Secondary | ICD-10-CM | POA: Diagnosis present

## 2020-09-14 LAB — COMPREHENSIVE METABOLIC PANEL
ALT: 61 U/L — ABNORMAL HIGH (ref 0–44)
AST: 95 U/L — ABNORMAL HIGH (ref 15–41)
Albumin: 2.9 g/dL — ABNORMAL LOW (ref 3.5–5.0)
Alkaline Phosphatase: 148 U/L — ABNORMAL HIGH (ref 38–126)
Anion gap: 20 — ABNORMAL HIGH (ref 5–15)
BUN: 72 mg/dL — ABNORMAL HIGH (ref 6–20)
CO2: 19 mmol/L — ABNORMAL LOW (ref 22–32)
Calcium: 8.4 mg/dL — ABNORMAL LOW (ref 8.9–10.3)
Chloride: 96 mmol/L — ABNORMAL LOW (ref 98–111)
Creatinine, Ser: 10.71 mg/dL — ABNORMAL HIGH (ref 0.61–1.24)
GFR, Estimated: 5 mL/min — ABNORMAL LOW (ref >60)
Glucose, Bld: 474 mg/dL — ABNORMAL HIGH (ref 70–99)
Potassium: 3.8 mmol/L (ref 3.5–5.1)
Sodium: 135 mmol/L (ref 135–145)
Total Bilirubin: 0.9 mg/dL (ref 0.3–1.2)
Total Protein: 5.6 g/dL — ABNORMAL LOW (ref 6.5–8.1)

## 2020-09-14 LAB — I-STAT CHEM 8, ED
BUN: 74 mg/dL — ABNORMAL HIGH (ref 6–20)
Calcium, Ion: 1.07 mmol/L — ABNORMAL LOW (ref 1.15–1.40)
Chloride: 98 mmol/L (ref 98–111)
Creatinine, Ser: 10.7 mg/dL — ABNORMAL HIGH (ref 0.61–1.24)
Glucose, Bld: 462 mg/dL — ABNORMAL HIGH (ref 70–99)
HCT: 34 % — ABNORMAL LOW (ref 39.0–52.0)
Hemoglobin: 11.6 g/dL — ABNORMAL LOW (ref 13.0–17.0)
Potassium: 3.3 mmol/L — ABNORMAL LOW (ref 3.5–5.1)
Sodium: 137 mmol/L (ref 135–145)
TCO2: 24 mmol/L (ref 22–32)

## 2020-09-14 LAB — GLUCOSE, CAPILLARY
Glucose-Capillary: 346 mg/dL — ABNORMAL HIGH (ref 70–99)
Glucose-Capillary: 386 mg/dL — ABNORMAL HIGH (ref 70–99)
Glucose-Capillary: 369 mg/dL — ABNORMAL HIGH (ref 70–99)
Glucose-Capillary: 325 mg/dL — ABNORMAL HIGH (ref 70–99)
Glucose-Capillary: 274 mg/dL — ABNORMAL HIGH (ref 70–99)
Glucose-Capillary: 216 mg/dL — ABNORMAL HIGH (ref 70–99)
Glucose-Capillary: 129 mg/dL — ABNORMAL HIGH (ref 70–99)
Glucose-Capillary: 81 mg/dL (ref 70–99)
Glucose-Capillary: 123 mg/dL — ABNORMAL HIGH (ref 70–99)
Glucose-Capillary: 52 mg/dL — ABNORMAL LOW (ref 70–99)
Glucose-Capillary: 81 mg/dL (ref 70–99)
Glucose-Capillary: 148 mg/dL — ABNORMAL HIGH (ref 70–99)
Glucose-Capillary: 54 mg/dL — ABNORMAL LOW (ref 70–99)
Glucose-Capillary: 113 mg/dL — ABNORMAL HIGH (ref 70–99)
Glucose-Capillary: 101 mg/dL — ABNORMAL HIGH (ref 70–99)
Glucose-Capillary: 93 mg/dL (ref 70–99)

## 2020-09-14 LAB — I-STAT ARTERIAL BLOOD GAS, ED
Acid-base deficit: 9 mmol/L — ABNORMAL HIGH (ref 0.0–2.0)
Bicarbonate: 17.1 mmol/L — ABNORMAL LOW (ref 20.0–28.0)
Calcium, Ion: 1.04 mmol/L — ABNORMAL LOW (ref 1.15–1.40)
HCT: 28 % — ABNORMAL LOW (ref 39.0–52.0)
Hemoglobin: 9.5 g/dL — ABNORMAL LOW (ref 13.0–17.0)
O2 Saturation: 99 %
Patient temperature: 95.1
Potassium: 4.4 mmol/L (ref 3.5–5.1)
Sodium: 136 mmol/L (ref 135–145)
TCO2: 18 mmol/L — ABNORMAL LOW (ref 22–32)
pCO2 arterial: 33.8 mmHg (ref 32.0–48.0)
pH, Arterial: 7.303 — ABNORMAL LOW (ref 7.350–7.450)
pO2, Arterial: 127 mmHg — ABNORMAL HIGH (ref 83.0–108.0)

## 2020-09-14 LAB — RESP PANEL BY RT-PCR (FLU A&B, COVID) ARPGX2
Influenza A by PCR: NEGATIVE
Influenza B by PCR: NEGATIVE
SARS Coronavirus 2 by RT PCR: NEGATIVE

## 2020-09-14 LAB — ECHOCARDIOGRAM COMPLETE
Area-P 1/2: 3.42 cm2
Calc EF: 43.8 %
Height: 69 in
S' Lateral: 3.4 cm
Single Plane A2C EF: 36.8 %
Single Plane A4C EF: 46.9 %

## 2020-09-14 LAB — CBC WITH DIFFERENTIAL/PLATELET
Abs Immature Granulocytes: 0.19 10*3/uL — ABNORMAL HIGH (ref 0.00–0.07)
Basophils Absolute: 0 10*3/uL (ref 0.0–0.1)
Basophils Relative: 1 %
Eosinophils Absolute: 0.1 10*3/uL (ref 0.0–0.5)
Eosinophils Relative: 2 %
HCT: 32.8 % — ABNORMAL LOW (ref 39.0–52.0)
Hemoglobin: 9.7 g/dL — ABNORMAL LOW (ref 13.0–17.0)
Immature Granulocytes: 4 %
Lymphocytes Relative: 30 %
Lymphs Abs: 1.7 10*3/uL (ref 0.7–4.0)
MCH: 25.4 pg — ABNORMAL LOW (ref 26.0–34.0)
MCHC: 29.6 g/dL — ABNORMAL LOW (ref 30.0–36.0)
MCV: 85.9 fL (ref 80.0–100.0)
Monocytes Absolute: 0.4 10*3/uL (ref 0.1–1.0)
Monocytes Relative: 7 %
Neutro Abs: 3.1 10*3/uL (ref 1.7–7.7)
Neutrophils Relative %: 56 %
Platelets: 247 10*3/uL (ref 150–400)
RBC: 3.82 MIL/uL — ABNORMAL LOW (ref 4.22–5.81)
RDW: 20.3 % — ABNORMAL HIGH (ref 11.5–15.5)
WBC: 5.5 10*3/uL (ref 4.0–10.5)
nRBC: 7.5 % — ABNORMAL HIGH (ref 0.0–0.2)

## 2020-09-14 LAB — PROTIME-INR
INR: 1.3 — ABNORMAL HIGH (ref 0.8–1.2)
Prothrombin Time: 16.1 seconds — ABNORMAL HIGH (ref 11.4–15.2)

## 2020-09-14 LAB — TYPE AND SCREEN
ABO/RH(D): O POS
Antibody Screen: NEGATIVE

## 2020-09-14 LAB — COOXEMETRY PANEL
Carboxyhemoglobin: 1.9 % — ABNORMAL HIGH (ref 0.5–1.5)
Methemoglobin: 1.2 % (ref 0.0–1.5)
O2 Saturation: 97 %
Total hemoglobin: 8.7 g/dL — ABNORMAL LOW (ref 12.0–16.0)

## 2020-09-14 LAB — HEMOGLOBIN A1C
Hgb A1c MFr Bld: 10.2 % — ABNORMAL HIGH (ref 4.8–5.6)
Mean Plasma Glucose: 246.04 mg/dL

## 2020-09-14 LAB — MRSA PCR SCREENING: MRSA by PCR: NEGATIVE

## 2020-09-14 LAB — ETHANOL: Alcohol, Ethyl (B): <10 mg/dL (ref <10)

## 2020-09-14 LAB — PATHOLOGIST SMEAR REVIEW

## 2020-09-14 LAB — TROPONIN I (HIGH SENSITIVITY): Troponin I (High Sensitivity): 693 ng/L (ref <18)

## 2020-09-14 LAB — BRAIN NATRIURETIC PEPTIDE: B Natriuretic Peptide: 2618.5 pg/mL — ABNORMAL HIGH (ref 0.0–100.0)

## 2020-09-14 MED ORDER — FAMOTIDINE 40 MG/5ML PO SUSR
20.0000 mg | Freq: Two times a day (BID) | ORAL | Status: DC
  Administered 2020-09-14: 20 mg
  Filled 2020-09-14: qty 2.5

## 2020-09-14 MED ORDER — MIDAZOLAM HCL 2 MG/2ML IJ SOLN: 2.0000 mg | INTRAMUSCULAR | Status: DC | PRN

## 2020-09-14 MED ORDER — FENTANYL CITRATE (PF) 100 MCG/2ML IJ SOLN
100.0000 ug | INTRAMUSCULAR | Status: DC | PRN
Start: 2020-09-14 — End: 2020-09-15

## 2020-09-14 MED ORDER — CHLORHEXIDINE GLUCONATE CLOTH 2 % EX PADS
6.0000 | MEDICATED_PAD | Freq: Every day | CUTANEOUS | Status: DC
  Administered 2020-09-15: 6 via TOPICAL

## 2020-09-14 MED ORDER — POLYETHYLENE GLYCOL 3350 17 G PO PACK: 17.0000 g | PACK | Freq: Every day | ORAL | Status: DC | PRN

## 2020-09-14 MED ORDER — SODIUM CHLORIDE 0.9% FLUSH: 10.0000 mL | INTRAVENOUS | Status: DC | PRN

## 2020-09-14 MED ORDER — INSULIN ASPART 100 UNIT/ML ~~LOC~~ SOLN
1.0000 [IU] | SUBCUTANEOUS | Status: DC
  Administered 2020-09-15: 1 [IU] via SUBCUTANEOUS

## 2020-09-14 MED ORDER — ORAL CARE MOUTH RINSE
15.0000 mL | OROMUCOSAL | Status: DC
  Administered 2020-09-14 – 2020-09-15 (×13): 15 mL via OROMUCOSAL

## 2020-09-14 MED ORDER — NOREPINEPHRINE 4 MG/250ML-% IV SOLN
0.0000 ug/min | INTRAVENOUS | Status: DC
  Administered 2020-09-14: 7 ug/min via INTRAVENOUS
  Administered 2020-09-14: 5 ug/min via INTRAVENOUS
  Filled 2020-09-14: qty 250

## 2020-09-14 MED ORDER — HEPARIN SODIUM (PORCINE) 5000 UNIT/ML IJ SOLN
5000.0000 [IU] | Freq: Three times a day (TID) | INTRAMUSCULAR | Status: DC
  Administered 2020-09-14 – 2020-09-15 (×4): 5000 [IU] via SUBCUTANEOUS
  Filled 2020-09-14 (×4): qty 1

## 2020-09-14 MED ORDER — DOCUSATE SODIUM 100 MG PO CAPS: 100.0000 mg | ORAL_CAPSULE | Freq: Two times a day (BID) | ORAL | Status: DC | PRN

## 2020-09-14 MED ORDER — DEXTROSE 50 % IV SOLN
0.0000 mL | INTRAVENOUS | Status: DC | PRN
  Administered 2020-09-14 (×2): 50 mL via INTRAVENOUS
  Filled 2020-09-14 (×3): qty 50

## 2020-09-14 MED ORDER — CHLORHEXIDINE GLUCONATE 0.12% ORAL RINSE (MEDLINE KIT)
15.0000 mL | Freq: Two times a day (BID) | OROMUCOSAL | Status: DC
  Administered 2020-09-14 – 2020-09-15 (×3): 15 mL via OROMUCOSAL

## 2020-09-14 MED ORDER — EPINEPHRINE HCL 5 MG/250ML IV SOLN IN NS
0.5000 ug/min | INTRAVENOUS | Status: DC
  Administered 2020-09-14: 5 ug/min via INTRAVENOUS

## 2020-09-14 MED ORDER — INSULIN DETEMIR 100 UNIT/ML ~~LOC~~ SOLN
5.0000 [IU] | Freq: Two times a day (BID) | SUBCUTANEOUS | Status: DC
  Filled 2020-09-14 (×3): qty 0.05

## 2020-09-14 MED ORDER — NOREPINEPHRINE 4 MG/250ML-% IV SOLN
INTRAVENOUS | Status: AC
  Filled 2020-09-14: qty 250

## 2020-09-14 MED ORDER — FAMOTIDINE 40 MG/5ML PO SUSR
20.0000 mg | Freq: Every day | ORAL | Status: DC
  Administered 2020-09-15: 20 mg
  Filled 2020-09-14: qty 2.5

## 2020-09-14 MED ORDER — SODIUM CHLORIDE 0.9% FLUSH
10.0000 mL | Freq: Two times a day (BID) | INTRAVENOUS | Status: DC
  Administered 2020-09-14 – 2020-09-15 (×2): 10 mL

## 2020-09-14 MED ORDER — INSULIN REGULAR(HUMAN) IN NACL 100-0.9 UT/100ML-% IV SOLN
INTRAVENOUS | Status: DC
  Administered 2020-09-14: 10 [IU]/h via INTRAVENOUS
  Filled 2020-09-14: qty 100

## 2020-09-14 NOTE — Procedures (Signed)
Central Venous Catheter Insertion Procedure Note  MANTAJ CHAMBERLIN  [id number]  1966-05-20  Date:09/14/2020  Time:8:27 AM   Provider Performing:Zayley Arras R Ky Rumple   Procedure: Insertion of Non-tunneled Central Venous [phone]) with US guidance ([id number])   Indication(s) Medication administration  Consent Risks of the procedure as well as the alternatives and risks of each were explained to the patient and/or caregiver.  Consent for the procedure was obtained verbally.  Anesthesia none  Timeout Verified patient identification, verified procedure, site/side was marked, verified correct patient position, special equipment/implants available, medications/allergies/relevant history reviewed, required imaging and test results available.  Sterile Technique Maximal sterile technique including full sterile barrier drape, hand hygiene, sterile gown, sterile gloves, mask, hair covering, sterile ultrasound probe cover (if used).  Procedure Description Area of catheter insertion was cleaned with chlorhexidine and draped in sterile fashion.  With real-time ultrasound guidance a central venous catheter was placed into the left internal jugular vein. Nonpulsatile blood flow and easy flushing noted in all ports.  The catheter was sutured in place and sterile dressing applied.  Complications/Tolerance None; patient tolerated the procedure well. Chest X-ray is ordered to verify placement for internal jugular or subclavian cannulation.   Chest x-ray is not ordered for femoral cannulation.  EBL Minimal  Specimen(s) None
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
## 2020-09-14 NOTE — ED Notes (Signed)
levofed drip started at 5 mcg

## 2020-09-14 NOTE — ED Triage Notes (Signed)
Pt arrived by [facility] with cpr in progress

## 2020-09-14 NOTE — ED Notes (Signed)
Ep;i drip stopped per order of dr Pearletha Alfred

## 2020-09-14 NOTE — ED Notes (Signed)
Epi drip decreased to 8

## 2020-09-14 NOTE — Progress Notes (Signed)
Patient transported from ED to [place]. RN at bedside.

## 2020-09-14 NOTE — ED Notes (Signed)
Levophed decreased to 98mcg

## 2020-09-14 NOTE — ED Notes (Signed)
Levophed up to 22  Mcg iv per critical care

## 2020-09-14 NOTE — ED Notes (Signed)
Levophed down to 14 mcg

## 2020-09-14 NOTE — ED Notes (Signed)
epine drip

## 2020-09-14 NOTE — ED Notes (Signed)
Ems gave the pt at   The scene  Epi x 2 jet iv sod bicard iv x 1  Calcium chloride jet iv at  The scene and at   The house

## 2020-09-14 NOTE — ED Notes (Signed)
Epi drip stopped

## 2020-09-14 NOTE — ED Notes (Signed)
epine bristojet iv

## 2020-09-14 NOTE — ED Notes (Signed)
Levophed drip down to28mcg

## 2020-09-14 NOTE — ED Notes (Signed)
Dr Silas Flood critical care at   The bedside
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
## 2020-09-14 NOTE — ED Notes (Signed)
Epi drip increased to 12  mcg

## 2020-09-14 NOTE — ED Notes (Signed)
epie drip dowsn to 2 mcg

## 2020-09-14 NOTE — ED Notes (Signed)
Chest xray portable

## 2020-09-14 NOTE — Progress Notes (Signed)
  Echocardiogram 2D Echocardiogram has been performed.  Geoffery Lyons [place] 08/30/2020, 8:16 AM
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
## 2020-09-14 NOTE — ED Notes (Signed)
epine drip will not let me chabge the rate in rate change  Critical care at bedside giving orders

## 2020-09-14 NOTE — Progress Notes (Signed)
Seen and examined in the ED.  BP 113/66   Pulse (!) 57   Resp (!) 21   Ht 5\' 9"  (1.753 m)   SpO2 91%   BMI 27.17 kg/m   Remains off sedation. Now off epinephrine, remains on norepinephrine. Bloody secretions from ETT, Pplat 23, breathing above the vent. FiO2 60--> 50%, still saturating 100%. Going to [facility] ICU soon.   Julian Hy, DO 08/25/2020 8:13 AM [facility]  From 7AM- 7PM if no response to pager, please call [phone]. After hours, 7PM- 7AM, please call Elink  [phone].

## 2020-09-14 NOTE — ED Notes (Signed)
Fingers cold and body also   Pulse ox not picking up anywhere

## 2020-09-14 NOTE — ED Notes (Signed)
Central line lt neck

## 2020-09-14 NOTE — ED Notes (Signed)
Critical care doctor at  The bedside 

## 2020-09-14 NOTE — ED Notes (Signed)
abd xray read unable to visualize the og tube due to the equiopemnt around the pt.    70 og removed and a number 14 placed in the pts mouth  Some residual blood in the back of the pts throat asculted by [place] rn  thaT  THE 65 og was in place

## 2020-09-14 NOTE — ED Notes (Signed)
Levo phed decreased to18 mcg

## 2020-09-14 NOTE — ED Notes (Signed)
pein down to 4 mcg

## 2020-09-14 NOTE — ED Notes (Signed)
epin drip down to 10

## 2020-09-14 NOTE — ED Notes (Signed)
Epi drip down to 6

## 2020-09-15 ENCOUNTER — Inpatient Hospital Stay (HOSPITAL_COMMUNITY): Payer: Medicare Other

## 2020-09-15 DIAGNOSIS — G931 Anoxic brain damage, not elsewhere classified: Secondary | ICD-10-CM

## 2020-09-15 LAB — CBC
HCT: 25.3 % — ABNORMAL LOW (ref 39.0–52.0)
Hemoglobin: 8.3 g/dL — ABNORMAL LOW (ref 13.0–17.0)
MCH: 25.6 pg — ABNORMAL LOW (ref 26.0–34.0)
MCHC: 32.8 g/dL (ref 30.0–36.0)
MCV: 78.1 fL — ABNORMAL LOW (ref 80.0–100.0)
Platelets: 244 10*3/uL (ref 150–400)
RBC: 3.24 MIL/uL — ABNORMAL LOW (ref 4.22–5.81)
RDW: 20 % — ABNORMAL HIGH (ref 11.5–15.5)
WBC: 4.2 10*3/uL (ref 4.0–10.5)
nRBC: 1.2 % — ABNORMAL HIGH (ref 0.0–0.2)

## 2020-09-15 LAB — BETA-HYDROXYBUTYRIC ACID: Beta-Hydroxybutyric Acid: 0.05 mmol/L (ref 0.05–0.27)

## 2020-09-15 LAB — BASIC METABOLIC PANEL
Anion gap: 14 (ref 5–15)
BUN: 80 mg/dL — ABNORMAL HIGH (ref 6–20)
CO2: 23 mmol/L (ref 22–32)
Calcium: 7.9 mg/dL — ABNORMAL LOW (ref 8.9–10.3)
Chloride: 104 mmol/L (ref 98–111)
Creatinine, Ser: 11.23 mg/dL — ABNORMAL HIGH (ref 0.61–1.24)
GFR, Estimated: 5 mL/min — ABNORMAL LOW (ref >60)
Glucose, Bld: 70 mg/dL (ref 70–99)
Potassium: 3.7 mmol/L (ref 3.5–5.1)
Sodium: 141 mmol/L (ref 135–145)

## 2020-09-15 LAB — C DIFFICILE QUICK SCREEN W PCR REFLEX
C Diff antigen: NEGATIVE
C Diff interpretation: NOT DETECTED
C Diff toxin: NEGATIVE

## 2020-09-15 LAB — GLUCOSE, CAPILLARY
Glucose-Capillary: 109 mg/dL — ABNORMAL HIGH (ref 70–99)
Glucose-Capillary: 109 mg/dL — ABNORMAL HIGH (ref 70–99)
Glucose-Capillary: 127 mg/dL — ABNORMAL HIGH (ref 70–99)
Glucose-Capillary: 130 mg/dL — ABNORMAL HIGH (ref 70–99)
Glucose-Capillary: 150 mg/dL — ABNORMAL HIGH (ref 70–99)
Glucose-Capillary: 167 mg/dL — ABNORMAL HIGH (ref 70–99)
Glucose-Capillary: 63 mg/dL — ABNORMAL LOW (ref 70–99)
Glucose-Capillary: 71 mg/dL (ref 70–99)
Glucose-Capillary: 76 mg/dL (ref 70–99)
Glucose-Capillary: 78 mg/dL (ref 70–99)

## 2020-09-15 LAB — PHOSPHORUS: Phosphorus: 5.7 mg/dL — ABNORMAL HIGH (ref 2.5–4.6)

## 2020-09-15 LAB — MAGNESIUM: Magnesium: 1.6 mg/dL — ABNORMAL LOW (ref 1.7–2.4)

## 2020-09-15 LAB — AMMONIA: Ammonia: 22 umol/L (ref 9–35)

## 2020-09-15 MED ORDER — INSULIN ASPART 100 UNIT/ML ~~LOC~~ SOLN
0.0000 [IU] | SUBCUTANEOUS | Status: DC
  Administered 2020-09-15: 1 [IU] via SUBCUTANEOUS

## 2020-09-15 MED ORDER — ACETAMINOPHEN 325 MG PO TABS: 650.0000 mg | ORAL_TABLET | Freq: Four times a day (QID) | ORAL | Status: DC | PRN

## 2020-09-15 MED ORDER — DEXTROSE 5 % IV SOLN: INTRAVENOUS | Status: DC

## 2020-09-15 MED ORDER — GLYCOPYRROLATE 0.2 MG/ML IJ SOLN: 0.2000 mg | INTRAMUSCULAR | Status: DC | PRN

## 2020-09-15 MED ORDER — ACETAMINOPHEN 650 MG RE SUPP: 650.0000 mg | Freq: Four times a day (QID) | RECTAL | Status: DC | PRN

## 2020-09-15 MED ORDER — MAGNESIUM SULFATE 2 GM/50ML IV SOLN
2.0000 g | Freq: Once | INTRAVENOUS | Status: AC
  Administered 2020-09-15: 2 g via INTRAVENOUS
  Filled 2020-09-15: qty 50

## 2020-09-15 MED ORDER — MORPHINE 100MG IN NS 100ML (1MG/ML) PREMIX INFUSION
5.0000 mg/h | INTRAVENOUS | Status: DC
Start: 2020-09-15 — End: 2020-09-16
  Administered 2020-09-15: 5 mg/h via INTRAVENOUS
  Filled 2020-09-15: qty 100

## 2020-09-15 MED ORDER — POLYVINYL ALCOHOL 1.4 % OP SOLN
1.0000 [drp] | Freq: Four times a day (QID) | OPHTHALMIC | Status: DC | PRN
Start: 2020-09-15 — End: 2020-09-16
  Filled 2020-09-15: qty 15

## 2020-09-15 MED ORDER — DEXTROSE 10 % IV SOLN: INTRAVENOUS | Status: DC

## 2020-09-15 MED ORDER — DEXTROSE-NACL 10-0.45 % IV SOLN
INTRAVENOUS | Status: DC
  Filled 2020-09-15 (×2): qty 1000

## 2020-09-15 MED ORDER — DEXTROSE-NACL 5-0.45 % IV SOLN: INTRAVENOUS | Status: DC

## 2020-09-15 MED ORDER — DIPHENHYDRAMINE HCL 50 MG/ML IJ SOLN: 25.0000 mg | INTRAMUSCULAR | Status: DC | PRN

## 2020-09-15 MED ORDER — GLYCOPYRROLATE 1 MG PO TABS
1.0000 mg | ORAL_TABLET | ORAL | Status: DC | PRN
  Filled 2020-09-15: qty 1

## 2020-09-15 MED ORDER — DEXTROSE 50 % IV SOLN
12.5000 g | INTRAVENOUS | Status: AC
  Administered 2020-09-15: 12.5 g via INTRAVENOUS

## 2020-09-19 DIAGNOSIS — G935 Compression of brain: Secondary | ICD-10-CM | POA: Diagnosis present

## 2020-09-19 LAB — CULTURE, BLOOD (ROUTINE X 2)
Culture: NO GROWTH
Culture: NO GROWTH
Special Requests: ADEQUATE
Special Requests: ADEQUATE

## 2020-09-25 NOTE — Death Summary Note (Signed)
DEATH SUMMARY   Patient Details  Name: Jeremiah [name] MRN: [id number] DOB: 1966-02-14  Admission/Discharge Information   Admit Date:  10/11/20  Date of Death: Date of Death: 10/12/2020  Time of Death: Time of Death: 13-Oct-2056  Length of Stay: 1  Referring Physician: Maximiano Coss, NP   Reason(s) for Hospitalization  55 year old ESRD, type 2 diabetes admitted 10/12/22 with PEA arrest after collapse at home with approximate 15+ minutes of resuscitative efforts and ROSC, intubated in the ED.  Diagnoses  Preliminary cause of death:  Secondary Diagnoses (including complications and co-morbidities):  Active Problems:   Cardiac arrest [facility])   Brain herniation [facility])   Brief Hospital Course (including significant findings, care, treatment, and services provided and events leading to death)  Jeremiah [name] is a 55 y.o. year old male who ESRD, type 2 diabetes admitted 2022-10-12 with PEA arrest after collapse at home with approximate 15+ minutes of resuscitative efforts and ROSC, intubated in the ED.  History unobtainable from patient.  Partner discussed what happened but not very clear.  Majority of history per EMR.  Reportedly patient called out, fell after arising from bed.  EMS arrived he was agonal he breathing.  Then went into arrest, no pulse, a King airway was placed, CPR commenced.  About 15 minutes per report.  Epi was given multiple times.  As rolling into the ED patient lost pulse again.  ACLS was resumed in the emergency room.  King airway exchanged for ET tube.  ROSC was obtained.  Initial blood pressure was hypertensive.  At time of evaluation blood pressure 60s over 40s, as low as 40s over 20s.  Escalated doses of nor epi and epi to around 20 each.  Still hypotensive.  Central line placed after discussing with partner.  Lab notable for stable anemia.  proBNP elevated from baseline.  Chest x-ray with bilateral infiltrates, volume overload.  EKG without STEMI.  Creatinine elevated as  would be expected from someone who last had dialysis over 48 hours ago.  Hospital course patient was treated for the following:  Cerebellar Tonsillar Herniation  Cerebral Edema  Anoxic Brain injury  PEA arrest, present on admission  Acute hypoxemic respiratory Shock ESRD Hypomagnesemia  Hyperglycemia with baseline IDDM Toxic Metabolic Encephalopathy  Ultimately the patients neuro exam worsened. The decision was made to obtain a CT head. The CT head revealed diffuse cerebral edema with loss of gray-white differentiation likely reflecting sequelae of reported cardiac arrest. Effacement of sulci, ventricles, and basal cisterns. Tonsillar herniation with crowding at the foramen magnum.  After discussion with the patients family the decision was made to purse comofrt care measures. The patient was extubated and passed in the ICU on comfort care.   Pertinent Labs and Studies  Significant Diagnostic Studies DG Abdomen 1 View  Result Date: 10-11-20 CLINICAL DATA:  55 year old male OG tube placement. EXAM: ABDOMEN - 1 VIEW COMPARISON:  Portable chest 0455 hours today. FINDINGS: AP view at 0541 hours. No enteric tube is identified. Pacer or resuscitation pads continue to project over the central diaphragm. Endotracheal tube tip was above the level of the trachea seen here last hour. Stable visible lungs and mediastinum. Negative visible bowel gas. No acute osseous abnormality identified. IMPRESSION: No enteric tube identified in the lower chest or abdomen. It might be looped in the neck. Recommend withdrawal and repositioning or repeat portable chest x-ray. Electronically Signed   By: Genevie Ann M.D.   On: October 11, 2020 05:49   CT HEAD WO CONTRAST  Result Date: 29-Sep-2020 CLINICAL DATA:  Post cardiac arrest EXAM: CT HEAD WITHOUT CONTRAST TECHNIQUE: Contiguous axial images were obtained from the base of the skull through the vertex without intravenous contrast. COMPARISON:  05/08/2020 FINDINGS: Brain:  Diffuse loss of gray-white differentiation. Sulcal effacement and partial effacement of the ventricles. Central herniation with effacement of the basal cisterns. Cerebellar tonsillar herniation with crowding at the foramen magnum. No acute intracranial hemorrhage. Vascular: No hyperdense vessel or unexpected calcification. Skull: Calvarium is unremarkable. Sinuses/Orbits: No acute finding. Other: None. IMPRESSION: Diffuse cerebral edema with loss of gray-white differentiation likely reflecting sequelae of reported cardiac arrest. Effacement of sulci, ventricles, and basal cisterns. Tonsillar herniation with crowding at the foramen magnum. Electronically Signed   By: Macy Mis M.D.   On: Sep 29, 2020 13:58   DG Chest Portable 1 View  Result Date: 09/09/2020 CLINICAL DATA:  55 year old male central line placement. EXAM: PORTABLE CHEST 1 VIEW COMPARISON:  0455 hours today and earlier. FINDINGS: Portable AP supine view at 0626 hours. Stable tracheostomy tube tip at the level of the clavicles. New left IJ approach central line in place, tip at the cavoatrial junction level. No pneumothorax. Stable cardiac size and mediastinal contours. Continued low lung volumes. Continued indistinct bilateral pulmonary interstitial opacity. No pleural effusion or consolidation. No acute osseous abnormality identified. IMPRESSION: 1. New left IJ approach central line with tip at the cavoatrial junction level. No adverse features. 2. Otherwise stable chest with low lung volumes and indistinct pulmonary interstitial opacity. Electronically Signed   By: Genevie Ann M.D.   On: 09/03/2020 06:40   DG Chest Port 1 View  Result Date: 09/01/2020 CLINICAL DATA:  Post CPR EXAM: PORTABLE CHEST 1 VIEW COMPARISON:  08/14/2020 FINDINGS: Endotracheal tube tip terminates in the mid trachea, 5.4 cm from the carina. External support devices and pacer pads overlie the chest. Stable cardiomegaly when compared to prior exams. Diffuse hazy opacities are  present in a perihilar and mid to lower lung predominance. No pneumothorax. No visible effusion. No acute osseous or soft tissue abnormality. Degenerative changes are present in the imaged spine and shoulders. IMPRESSION: 1. Endotracheal tube tip terminates in the mid trachea, 5.4 cm from the carina. 2. Diffuse hazy opacities in a perihilar and mid to lower lung predominance, in the setting of resuscitative efforts, could reflect pulmonary edema, contusive change, atelectasis and/or ARDS. Electronically Signed   By: Lovena Le M.D.   On: 09/11/2020 05:16   DG Abd Portable 1V  Result Date: 09/06/2020 CLINICAL DATA:  Orogastric tube placement EXAM: PORTABLE ABDOMEN - 1 VIEW COMPARISON:  September 14, 2020 study obtained earlier in the day FINDINGS: Orogastric tube tip and side port are in the stomach. There is no bowel dilatation or air-fluid level to suggest bowel obstruction. No free air. Visualized lung bases are clear. IMPRESSION: Orogastric tube tip and side port in stomach. No bowel obstruction or free air evident on supine examination. Electronically Signed   By: Lowella Grip III M.D.   On: 09/17/2020 11:56   EEG adult  Result Date: 29-Sep-2020 Raenette Rover, MD     September 29, 2020 11:17 AM EEG Report Indication: Unresponsiveness after cardiopulmonary arrest Duration: 22 minutes, 24 seconds Technical: Leads were placed according to the international 10-20 system; technical quality was excellent.  Video was recorded for clinical correlation as needed. Findings: There were diffusely very low amplitudes (<5 microvolts), other than periods during which there was a clear source of artifact (leads being checked by technician).  It was unclear  if there was any cortical electrical activity--possibly some of very low amplitude. PS/HV were deferred. Impression: The above findings are most consistent with a severe diffuse encephalopathy--there was diffuse, severe suppression, with only very equivocal cortical  electrical activity. If seen in the context of hypoxic injury without clear confounders (sedation, metabolic, temperature) this pattern is often highly specific for a poor prognosis for recovery of consciousness.  ECHOCARDIOGRAM COMPLETE  Result Date: 09/18/2020    ECHOCARDIOGRAM REPORT   Patient Name:   BROADY LAFOY Date of Exam: 09/22/2020 Medical Rec #:  [id number]         Height:       69.0 in Accession #:    [id number]        Weight:       184.0 lb Date of Birth:  [birth year]        BSA:          1.994 m Patient Age:    42 years          BP:           113/66 mmHg Patient Gender: M                 HR:           55 bpm. Exam Location:  Inpatient Procedure: 2D Echo, Cardiac Doppler and Color Doppler STAT ECHO                            MODIFIED REPORT: This report was modified by Dorris Carnes MD on 09/14/2020 due to Complete.  Indications:     Cardiac arrest I46.9  History:         Patient has prior history of Echocardiogram examinations, most                  recent 08/07/2020. Signs/Symptoms:Murmur; Risk                  Factors:Hypertension, Diabetes and Non-Smoker. CKD.  Sonographer:     Vickie Epley RDCS Referring Phys:  [place] Diagnosing Phys: Dorris Carnes MD  Sonographer Comments: Echo performed with patient supine and on artificial respirator. IMPRESSIONS  1. Left ventricular ejection fraction, by estimation, is 45 to 50%. The left ventricle has mildly decreased function. The left ventricle has no regional wall motion abnormalities. There is severe left ventricular hypertrophy. Left ventricular diastolic parameters are indeterminate.  2. Right ventricular systolic function is mildly reduced. The right ventricular size is normal. There is mildly elevated pulmonary artery systolic pressure.  3. Right atrial size was mildly dilated.  4. The mitral valve is grossly normal. Trivial mitral valve regurgitation.  5. The aortic valve is tricuspid. Aortic valve regurgitation is trivial. Mild  aortic valve sclerosis is present, with no evidence of aortic valve stenosis.  6. The inferior vena cava is dilated in size with <50% respiratory variability, suggesting right atrial pressure of 15 mmHg. FINDINGS  Left Ventricle: Left ventricular ejection fraction, by estimation, is 45 to 50%. The left ventricle has mildly decreased function. The left ventricle has no regional wall motion abnormalities. The left ventricular internal cavity size was normal in size. There is severe left ventricular hypertrophy. Left ventricular diastolic parameters are indeterminate. Right Ventricle: The right ventricular size is normal. Right vetricular wall thickness was not assessed. Right ventricular systolic function is mildly reduced. There is mildly elevated pulmonary artery systolic pressure. The tricuspid regurgitant velocity is 2.39 m/s,  and with an assumed right atrial pressure of 15 mmHg, the estimated right ventricular systolic pressure is 18.2 mmHg. Left Atrium: Left atrial size was normal in size. Right Atrium: Right atrial size was mildly dilated. Pericardium: There is no evidence of pericardial effusion. Mitral Valve: The mitral valve is grossly normal. Trivial mitral valve regurgitation. Tricuspid Valve: The tricuspid valve is normal in structure. Tricuspid valve regurgitation is mild. Aortic Valve: The aortic valve is tricuspid. Aortic valve regurgitation is trivial. Mild aortic valve sclerosis is present, with no evidence of aortic valve stenosis. Pulmonic Valve: The pulmonic valve was normal in structure. Pulmonic valve regurgitation is trivial. Aorta: The aortic root and ascending aorta are structurally normal, with no evidence of dilitation. Venous: The inferior vena cava is dilated in size with less than 50% respiratory variability, suggesting right atrial pressure of 15 mmHg. IAS/Shunts: No atrial level shunt detected by color flow Doppler.  LEFT VENTRICLE PLAX 2D LVIDd:         4.70 cm      Diastology LVIDs:          3.40 cm      LV e' medial:    3.90 cm/s LV PW:         1.60 cm      LV E/e' medial:  25.0 LV IVS:        1.60 cm      LV e' lateral:   5.25 cm/s LVOT diam:     2.30 cm      LV E/e' lateral: 18.6 LV SV:         63 LV SV Index:   32 LVOT Area:     4.15 cm  LV Volumes (MOD) LV vol d, MOD A2C: 163.0 ml LV vol d, MOD A4C: 188.0 ml LV vol s, MOD A2C: 103.0 ml LV vol s, MOD A4C: 99.9 ml LV SV MOD A2C:     60.0 ml LV SV MOD A4C:     188.0 ml LV SV MOD BP:      79.6 ml RIGHT VENTRICLE RV S prime:     6.87 cm/s TAPSE (M-mode): 1.4 cm LEFT ATRIUM           Index       RIGHT ATRIUM           Index LA diam:      4.90 cm 2.46 cm/m  RA Area:     19.10 cm LA Vol (A2C): 57.1 ml 28.63 ml/m RA Volume:   61.40 ml  30.79 ml/m LA Vol (A4C): 42.8 ml 21.46 ml/m  AORTIC VALVE LVOT Vmax:   63.70 cm/s LVOT Vmean:  43.800 cm/s LVOT VTI:    0.152 m  AORTA Ao Root diam: 3.40 cm Ao Asc diam:  3.50 cm MITRAL VALVE               TRICUSPID VALVE MV Area (PHT): 3.42 cm    TR Peak grad:   22.8 mmHg MV Decel Time: 222 msec    TR Vmax:        239.00 cm/s MV E velocity: 97.50 cm/s MV A velocity: 50.60 cm/s  SHUNTS MV E/A ratio:  1.93        Systemic VTI:  0.15 m                            Systemic Diam: 2.30 cm Dorris Carnes MD Electronically signed by Dorris Carnes MD Signature Date/Time:  09/20/2020/8:26:44 AM    Final (Updated)     Microbiology Recent Results (from the past 240 hour(s))  Resp Panel by RT-PCR (Flu A&B, Covid) Nasopharyngeal Swab     Status: None   Collection Time: 09/03/2020  5:13 AM   Specimen: Nasopharyngeal Swab; Nasopharyngeal(NP) swabs in vial transport medium  Result Value Ref Range Status   SARS Coronavirus 2 by RT PCR NEGATIVE NEGATIVE Final    Comment: (NOTE) SARS-CoV-2 target nucleic acids are NOT DETECTED.  The SARS-CoV-2 RNA is generally detectable in upper respiratory specimens during the acute phase of infection. The lowest concentration of SARS-CoV-2 viral copies this assay can detect is 138  copies/mL. A negative result does not preclude SARS-Cov-2 infection and should not be used as the sole basis for treatment or other patient management decisions. A negative result may occur with  improper specimen collection/handling, submission of specimen other than nasopharyngeal swab, presence of viral mutation(s) within the areas targeted by this assay, and inadequate number of viral copies(<138 copies/mL). A negative result must be combined with clinical observations, patient history, and epidemiological information. The expected result is Negative.  Fact Sheet for Patients:  EntrepreneurPulse.com.au  Fact Sheet for Healthcare Providers:  IncredibleEmployment.be  This test is no t yet approved or cleared by the [place] FDA and  has been authorized for detection and/or diagnosis of SARS-CoV-2 by FDA under an Emergency Use Authorization (EUA). This EUA will remain  in effect (meaning this test can be used) for the duration of the COVID-19 declaration under Section 564(b)(1) of the Act, 21 U.S.C.section 360bbb-3(b)(1), unless the authorization is terminated  or revoked sooner.       Influenza A by PCR NEGATIVE NEGATIVE Final   Influenza B by PCR NEGATIVE NEGATIVE Final    Comment: (NOTE) The Xpert Xpress SARS-CoV-2/FLU/RSV plus assay is intended as an aid in the diagnosis of influenza from Nasopharyngeal swab specimens and should not be used as a sole basis for treatment. Nasal washings and aspirates are unacceptable for Xpert Xpress SARS-CoV-2/FLU/RSV testing.  Fact Sheet for Patients: EntrepreneurPulse.com.au  Fact Sheet for Healthcare Providers: IncredibleEmployment.be  This test is not yet approved or cleared by the [place] FDA and has been authorized for detection and/or diagnosis of SARS-CoV-2 by FDA under an Emergency Use Authorization (EUA). This EUA will remain in effect (meaning  this test can be used) for the duration of the COVID-19 declaration under Section 564(b)(1) of the Act, 21 U.S.C. section 360bbb-3(b)(1), unless the authorization is terminated or revoked.  Performed at [facility], [place] [address], [place], [place] [address]   MRSA PCR Screening     Status: None   Collection Time: 08/30/2020  9:26 AM   Specimen: Nasopharyngeal  Result Value Ref Range Status   MRSA by PCR NEGATIVE NEGATIVE Final    Comment:        The GeneXpert MRSA Assay (FDA approved for NASAL specimens only), is one component of a comprehensive MRSA colonization surveillance program. It is not intended to diagnose MRSA infection nor to guide or monitor treatment for MRSA infections. Performed at [facility], [place] [address], [place], [place] [address]   Culture, blood (routine x 2)     Status: None (Preliminary result)   Collection Time: 09/03/2020  9:46 AM   Specimen: BLOOD  Result Value Ref Range Status   Specimen Description BLOOD SITE NOT SPECIFIED  Final   Special Requests   Final    BOTTLES DRAWN AEROBIC AND  ANAEROBIC Blood Culture adequate volume   Culture   Final    NO GROWTH 4 DAYS Performed at [facility] [address], [place], [place] [address]    Report Status PENDING  Incomplete  Culture, blood (routine x 2)     Status: None (Preliminary result)   Collection Time: 09/21/2020  9:46 AM   Specimen: BLOOD  Result Value Ref Range Status   Specimen Description BLOOD SITE NOT SPECIFIED  Final   Special Requests   Final    BOTTLES DRAWN AEROBIC AND ANAEROBIC Blood Culture adequate volume   Culture   Final    NO GROWTH 4 DAYS Performed at [facility], [address] [place], [place] [address]    Report Status PENDING  Incomplete  C Difficile Quick Screen w PCR reflex     Status: None   Collection Time: 09-Oct-2020 10:52 AM   Specimen: STOOL  Result Value Ref Range Status   C Diff antigen NEGATIVE NEGATIVE Final   C Diff toxin NEGATIVE  NEGATIVE Final   C Diff interpretation No C. difficile detected.  Final    Comment: Performed at [facility], [place] [address], [place], [place] [address]    Lab Basic Metabolic Panel: Recent Labs  Lab 09/23/2020 [phone] 09/20/2020 0459 09/05/2020 0552 10/09/2020 0347  NA 137 135 136 141  K 3.3* 3.8 4.4 3.7  CL 98 96*  --  104  CO2  --  19*  --  23  GLUCOSE 462* 474*  --  70  BUN 74* 72*  --  80*  CREATININE 10.70* 10.71*  --  11.23*  CALCIUM  --  8.4*  --  7.9*  MG  --   --   --  1.6*  PHOS  --   --   --  5.7*   Liver Function Tests: Recent Labs  Lab 09/01/2020 0459  AST 95*  ALT 61*  ALKPHOS 148*  BILITOT 0.9  PROT 5.6*  ALBUMIN 2.9*   No results for input(s): LIPASE, AMYLASE in the last 168 hours. Recent Labs  Lab October 09, 2020 1144  AMMONIA 22   CBC: Recent Labs  Lab 09/18/2020 0458 09/20/2020 0459 08/29/2020 0552 10-09-20 0347  WBC  --  5.5  --  4.2  NEUTROABS  --  3.1  --   --   HGB 11.6* 9.7* 9.5* 8.3*  HCT 34.0* 32.8* 28.0* 25.3*  MCV  --  85.9  --  78.1*  PLT  --  247  --  244   Cardiac Enzymes: No results for input(s): CKTOTAL, CKMB, CKMBINDEX, TROPONINI in the last 168 hours. Sepsis Labs: Recent Labs  Lab 09/02/2020 0459 10/09/2020 0347  WBC 5.5 4.2    Procedures/Operations  CT head    Bradley L Icard 09/19/2020, 10:44 AM

## 2020-09-25 NOTE — Progress Notes (Signed)
EEG complete - results pending 

## 2020-09-25 NOTE — Progress Notes (Signed)
PCCM:  I called and spoke with the patient's significant other Thayer Headings.  The patient's son this morning stated that she would be making his medical decisions.  He has 2 other brothers.  And a cousin was available at the time.  EEG results show diffuse encephalopathy.  CT scan of the head still pending.  Garner Nash, DO [facility] 18-Sep-2020 12:20 PM

## 2020-09-25 NOTE — Progress Notes (Addendum)
S. Patients family to bedside with Dr. Valeta Harms to discuss results of CT head along with patients clinical findings  O. Head CT shows cerebellar tonsilar herniation, patient lacks corneal, cough, gag reflex, not breathing over on the ventilator   A. Dr. Valeta Harms notified the family that this injury was non-survivable and the patient would not return to their previous baseline functioning. The family was amenable to making the patient comfort status, liberalizing visitation, and extubating the patient when the family was ready  P. Will make the patient a DNR, start morphine gtt for end of life pain control, put in order for RT to extubate the patient when the family is ready  Redmond School., MSN, APRN, AGACNP-BC [facility]  10-13-2020 , 3:49 PM   Please see [email or web address] for pager details  From 7a-7p if no response, please call [phone] After hours, please call Elink at [phone]    PCCM Goals of Care Discussion and Advanced Care Planning:   Date: 10-13-20   Present Parties: Son, Wife, Garwin Brothers  What was discussed: Severe anoxic brain injury, tonsillar herniation, PEA cardiac arrest. Discussed his unlikely chance at recovery and development of brain death.   Outcome:  DNR  Comfort care orders placed  Comfort is the only goal  Morphine infusion  22 mins of time was spent discussing the goals of care, advanced care planning options such as code status as well as do not resuscitate orders.   I agree with documentation above by TSG, NP.   [facility] 10-13-2020 4:59 PM

## 2020-09-25 NOTE — Progress Notes (Signed)
Ventilator patient transported from 2H26 to CT and back without any complications.

## 2020-09-25 NOTE — Progress Notes (Signed)
Family members at bedside notified this RN that they were ready to proceed with extubation and patient extubated at 2010-10-08, see RT note.  Absence of pulse, absence of respirations, and pupils fixed; pronounced at 10-07-56 by Tami Lin, RN and June Smith RN.  Emotional support given to family.  Post-mortem care provided and notifications made, see post-mortem flowsheet for details.

## 2020-09-25 NOTE — Procedures (Signed)
Extubation Procedure Note  Patient Details:   Name: PEDER ALLUMS DOB: Dec 02, 1965 MRN: [id number]   Airway Documentation:    Vent end date: 09/20/2020 Vent end time: 2012   Evaluation  O2 sats: transiently fell during during procedure Complications: No apparent complications Patient did not tolerate procedure well. Bilateral Breath Sounds: Diminished   No   Terminal extubation  Marwin Primmer R Meriel Kelliher 09-20-2020, 8:35 PM

## 2020-09-25 NOTE — Procedures (Signed)
EEG Report Indication: Unresponsiveness after cardiopulmonary arrest Duration: 22 minutes, 24 seconds Technical: Leads were placed according to the international 10-20 system; technical quality was excellent.  Video was recorded for clinical correlation as needed.  Findings:  There were diffusely very low amplitudes (<5 microvolts), other than periods during which there was a clear source of artifact (leads being checked by technician).  It was unclear if there was any cortical electrical activity--possibly some of very low amplitude.  PS/HV were deferred.  Impression:  The above findings are most consistent with a severe diffuse encephalopathy--there was diffuse, severe suppression, with only very equivocal cortical electrical activity.  If seen in the context of hypoxic injury without clear confounders (sedation, metabolic, temperature) this pattern is often highly specific for a poor prognosis for recovery of consciousness.

## 2020-09-25 NOTE — Progress Notes (Signed)
eLink Physician-Brief Progress Note Patient Name: JALEN DALUZ DOB: [birth year] MRN: [id number]   Date of Service  09-29-20  HPI/Events of Note  Notified of hypoglycemic episodes With significant feeding tube and stool output ESRD  eICU Interventions  Will start D10 at 69 and titrate up as needed Have room for fluids given significant output     Intervention Category Major Interventions: Other:  Judd Lien 09/29/20, 12:36 AM

## 2020-09-25 NOTE — Progress Notes (Addendum)
NAME:  Jeremiah [name], MRN:  [id number], DOB:  1965-07-01, LOS: 1 ADMISSION DATE:  09/01/2020, CONSULTATION DATE:  2020-10-03 and notes good none REFERRING MD:  ED, CHIEF COMPLAINT:  Cardiac arrest  History of Present Illness:  55 year old ESRD, type 2 diabetes admitted 3/21 with PEA arrest after collapse at home with approximate 15+ minutes of resuscitative efforts and ROSC, intubated in the ED.  History unobtainable from patient.  Partner discussed what happened but not very clear.  Majority of history per EMR.  Reportedly patient called out, fell after arising from bed.  EMS arrived he was agonal he breathing.  Then went into arrest, no pulse, a King airway was placed, CPR commenced.  About 15 minutes per report.  Epi was given multiple times.  As rolling into the ED patient lost pulse again.  ACLS was resumed in the emergency room.  King airway exchanged for ET tube.  ROSC was obtained.  Initial blood pressure was hypertensive.  At time of evaluation blood pressure 60s over 40s, as low as 40s over 20s.  Escalated doses of nor epi and epi to around 20 each.  Still hypotensive.  Central line placed after discussing with partner.  Lab notable for stable anemia.  proBNP elevated from baseline.  Chest x-ray with bilateral infiltrates, volume overload.  EKG without STEMI.  Creatinine elevated as would be expected from someone who last had dialysis over 48 hours ago.  Pertinent  Medical History  Diabetes, ESRD, hypertension  Significant Hospital Events:  . 3/21 cardiac arrest, admitted, central line placed, blood cultures pending  . 3/21 TTE >> LVEF 45-50%, no RWMA, severe LV hypertrophy . 3/22 poor neuro exam, CT / EEG pending   Interim History / Subjective:   Afebrile / now warm (was hypothermic on admit) Vent - 40%, PEEP 5  Glucose range 109-150 > RN reports insulin gtt off, periods of hypoglycemia overnight to 63 requiring dextrose I/O 1L stool, 200 emesis - no UOP / ESRD Levophed down  to 73mcg   Objective   Blood pressure 122/76, pulse 79, temperature 98.96 F (37.2 C), resp. rate 18, height 5\' 9"  (1.753 m), weight 86 kg, SpO2 100 %.    Vent Mode: PRVC FiO2 (%):  [40 %-50 %] 40 % Set Rate:  [18 bmp] 18 bmp Vt Set:  [560 mL] 560 mL PEEP:  [5 cmH20] 5 cmH20 Plateau Pressure:  [18 cmH20-21 cmH20] 18 cmH20   Intake/Output Summary (Last 24 hours) at 10/03/20 0935 Last data filed at 10/03/2020 0700 Gross per 24 hour  Intake 730.67 ml  Output 1225 ml  Net -494.33 ml   Filed Weights   Oct 03, 2020 0500  Weight: 86 kg    Examination: General: adult male, chronically ill appearing in NAD HEENT: MM pink/moist, ETT, pupils 2-79mm minimally reactive, scleral edema Neuro: on vent, no response to voice, painful stimuli, no gag / cough, no corneals, no spontaneous movement, 1-2 spontaneous breaths on PSV CV: s1s2 , no m/r/g PULM: non-labored on vent, lungs bilaterally clear GI: soft, bsx4 hypoactive, liquid stool in drainage bag Extremities: warm/dry, 2+ pre-tibial pitting edema  Skin: no rashes or lesions VVS: right fistula with no thrill or bruit on exam          Resolved Hospital Problem list      Assessment & Plan:   PEA arrest Unclear etiology. Suspect hypoxemic event.  Most likely volume overload based on exam and chest x-ray. Profoundly hypothermic on arrival (90), suggestive of possible longer downtime  -  supportive care  -tele monitoring  -see below  -hold all sedation to allow for exam  -not a TTM candidate due to duration of arrest, comorbid conditions  Acute hypoxemic respiratory In the setting of acute pulmonary edema. -PRVC 8cc/kg -mental status barrier for weaning  -follow intermittent CXR  -volume removal per HD    Shock Post cardiac arrest.  Most concerning for cardiac etiology.  Narrow pulse pressures.  No source of infection, chest x-ray most consistent pulmonary edema.  No leukocytosis.  No fevers.  No antibiotics ordered at this  time. -wean levophed to off for MAP >65  -d/c epi from [facility]  -TTE with LVEF 45-50% -suspect SvO2 falsely elevated   ESRD Monday Wednesday Friday HD -Appreciate Nephrology  -likely will need CRRT for HD given hypotension  -concerned AVF has been compromised due to low flow during arrest given no thrill / bruit on exam   Hypomagnesemia  -2gm mag now and follow up in am  Hyperglycemia with baseline IDDM Blood sugars in the 400s on admit, suspect stress response.  Anion gap is elevated but hard to interpret in setting of cardiac arrest, ESRD.  Volume up. -off insulin gtt, hold long acting insulin given periods of hypoglycemia  -SSI, very sensitive scale  -assess ammonia, beta-hydroxy  -continue D10, reduce to 28ml/hr with hypoglycemia   Toxic Metabolic Encephalopathy Post cardiac arrest.  Initial exam is worrisome for anoxic injury.  Does not withdraw to pain, pupils not blown but not reactive either. -hold all sedation to allow for neuro exam  -assess CT head, EEG now  -discussed likely poor prognosis with family at bedside   Best practice (evaluated daily)  Diet:  NPO Pain/Anxiety/Delirium protocol (if indicated): No, follow neuro exam  VAP protocol (if indicated): Yes DVT prophylaxis: Subcutaneous Heparin GI prophylaxis: H2B Glucose control:  SSI Yes Central venous access:  Yes, and it is still needed Arterial line:  N/A Foley:  N/A Mobility:  bed rest  PT consulted: N/A Last date of multidisciplinary goals of care discussion [n/a] Code Status:  full code Disposition: ICU  Labs   CBC: Recent Labs  Lab 09/13/2020 0458 09/10/2020 0459 08/29/2020 0552 10-13-2020 0347  WBC  --  5.5  --  4.2  NEUTROABS  --  3.1  --   --   HGB 11.6* 9.7* 9.5* 8.3*  HCT 34.0* 32.8* 28.0* 25.3*  MCV  --  85.9  --  78.1*  PLT  --  247  --  527    Basic Metabolic Panel: Recent Labs  Lab 08/31/2020 0458 09/08/2020 0459 09/16/2020 0552 2020/10/13 0347  NA 137 135 136 141  K 3.3* 3.8 4.4 3.7  CL  98 96*  --  104  CO2  --  19*  --  23  GLUCOSE 462* 474*  --  70  BUN 74* 72*  --  80*  CREATININE 10.70* 10.71*  --  11.23*  CALCIUM  --  8.4*  --  7.9*  MG  --   --   --  1.6*  PHOS  --   --   --  5.7*   GFR: Estimated Creatinine Clearance: 8.1 mL/min (A) (by C-G formula based on SCr of 11.23 mg/dL (H)). Recent Labs  Lab 09/14/20 0459 2020/10/13 0347  WBC 5.5 4.2    Liver Function Tests: Recent Labs  Lab 09/14/20 0459  AST 95*  ALT 61*  ALKPHOS 148*  BILITOT 0.9  PROT 5.6*  ALBUMIN 2.9*   No results  for input(s): LIPASE, AMYLASE in the last 168 hours. No results for input(s): AMMONIA in the last 168 hours.  ABG    Component Value Date/Time   PHART 7.303 (L) 09/24/2020 0552   PCO2ART 33.8 09/23/2020 0552   PO2ART 127 (H) 09/07/2020 0552   HCO3 17.1 (L) 09/05/2020 0552   TCO2 18 (L) 08/29/2020 0552   ACIDBASEDEF 9.0 (H) 09/20/2020 0552   O2SAT 97.0 09/01/2020 1002     Coagulation Profile: Recent Labs  Lab 09/07/2020 0459  INR 1.3*    Cardiac Enzymes: No results for input(s): CKTOTAL, CKMB, CKMBINDEX, TROPONINI in the last 168 hours.  HbA1C: HbA1c, POC (controlled diabetic range)  Date/Time Value Ref Range Status  09/03/2019 09:15 AM 10.1 (A) 0.0 - 7.0 % Final  12/04/2018 10:38 AM 9.0 (A) 0.0 - 7.0 % Final   Hgb A1c MFr Bld  Date/Time Value Ref Range Status  09/13/2020 09:46 AM 10.2 (H) 4.8 - 5.6 % Final    Comment:    (NOTE) Pre diabetes:          5.7%-6.4%  Diabetes:              >6.4%  Glycemic control for   <7.0% adults with diabetes   07/04/2020 12:52 PM 10.7 (H) 4.8 - 5.6 % Final    Comment:    (NOTE) Pre diabetes:          5.7%-6.4%  Diabetes:              >6.4%  Glycemic control for   <7.0% adults with diabetes     CBG: Recent Labs  Lab 2020-10-01 0324 10/01/20 0346 2020/10/01 0504 01-Oct-2020 0639 2020-10-01 0818  GLUCAP 63* 109* 109* 127* 150*     Critical care time: 36 minutes    Jeremiah Gens, MSN, APRN, NP-C,  AGACNP-BC [facility] 2020-10-01, 9:35 AM   Please see [email or web address] for pager details.   From 7A-7P if no response, please call [phone] After hours, please call Warren Lacy [phone]   PCCM:  54 yo M, Admitted for out of hospital arrest, ESRD, type 2 diabetes, out of hospital cardiac arrest.  Not clear on the exact downtime at home.  Patient's core temp was 90 on admission.  BP 104/70   Pulse 86   Temp 98.96 F (37.2 C)   Resp 18   Ht 5\' 9"  (1.753 m)   Wt 86 kg   SpO2 99%   BMI 28.00 kg/m   Gen: Elderly male intubated on life support HEENT: Endotracheal tube in place, secretions draining from nose Heart: Regular rate rhythm S1-S2 Lungs: Bilateral mechanically ventilated breath sounds Abdomen: Soft nontender nondistended With ventilator turned off patient does not have any backup rate Neuro: No cough no gag no corneals, both pupils fixed and dilated no response to pain.  Labs reviewed   A:  Clinically the patient has developed brain death. Severe anoxic brain injury, tonsillar herniation, cerebral edema He has not been dialyzed as of yet, with his recent ESRD and loss of fistula Cardiac arrest ESRD baseline IHD Monday Wednesday Friday Hyperglycemia Acute hypoxemic respiratory failure secondary to pulmonary edema post cardiac arrest and volume overload.  P:  Discussed goals of care with patient's family. Reviewed EEG results with diffuse encephalopathy Reviewed CT head results with patient's family with bilateral sulci effacement loss of the basal cisterns and tonsillar herniation into the foramen magnum. Recommended comfort care withdrawal Patient now a DNR. We had 2 separate palliative care meetings with  patient's family.  Once this morning and once again this afternoon after EEG and CT results were back. Family is agreeable to this plan.  This patient is critically ill with multiple organ system failure; which, requires frequent high  complexity decision making, assessment, support, evaluation, and titration of therapies. This was completed through the application of advanced monitoring technologies and extensive interpretation of multiple databases. During this encounter critical care time was devoted to patient care services described in this note for 40 minutes.   [facility] 10/03/20 3:00 PM

## 2020-09-25 NOTE — Progress Notes (Addendum)
[facility] Progress Note  Subjective: pt seen in room, no purposeful activity  Vitals:   10-14-2020 1000 October 14, 2020 1030 10/14/20 1100 10/14/20 1130  BP: 106/71 121/75 113/73 97/61  Pulse: 82 83 84 84  Resp: '18 18 18 18  ' Temp: 99.5 F (37.5 C) 99.5 F (37.5 C) 99.68 F (37.6 C) 99.68 F (37.6 C)  TempSrc:      SpO2: 96% 97% 98% 98%  Weight:      Height:        Exam: on vent ,sedated, not responsive  no jvd  throat ett in place  Chest cta bilat and lat  Cor reg no RG  Abd soft ntnd no ascites   Ext diffuse 2+ edema   Neuro on vent not following commands   RUA AVF+soft bruit     Home meds:  - coreg 25 bid/ hydralazine 100 tid/ losartan 100 qd/ isordil 30 tid  - auryxia 630 tid ac  - insulin glargien 10-20 qam/ aspart 5u tid ac   - prn's/ vitamins/ supplements      OP HD: [facility]   4h  2/2.25 bath  77kg  Hep none  RUA AVF   - hect 7  - venofer 100 x 4   - mircera 200 q2 last 3/14   - 5.5 and 5.8kg gains the last 2 session    CXR 3/21 - severe bilat infiltrates c/w edema   Assessment/ Plan: 1. PEA cardiac arrest - early am on 3/21. Now in ICU on vent.  2. AMS - possibly significant anoxic brain injury.  3. Acute resp failure - bilat infiltrates on CXR, probably pulm edema 4. Volume - vol overload by CXR, weights, exam. Chronic vol overload issues have been ongoing in OP setting.   5. Shock/ hypotension - pressors mostly off this am, BP's soft 6. ESRD -HD MWF. No bruit over R arm AVF. Will likely need CRRT. Could start today or tomorrow. Will d/w CCM.    7. Anemia: gets high dose ESA, iron as outpatient. Follow trends here.last esa was 1 wk ago, another 1 week til next due.  8. MBD/CKD -Corr Ca ok. Continue Hectorol/Auryxia binder  9. Nutrition -Renal diet/fluid restrictions   Kelly Splinter 2020/10/14, 12:16 PM   Recent Labs  Lab 09/12/2020 0459 09/03/2020 0552 10-14-2020 0347  K 3.8 4.4 3.7  BUN 72*  --  80*  CREATININE  10.71*  --  11.23*  CALCIUM 8.4*  --  7.9*  PHOS  --   --  5.7*  HGB 9.7* 9.5* 8.3*   Inpatient medications: . chlorhexidine gluconate (MEDLINE KIT)  15 mL Mouth Rinse BID  . Chlorhexidine Gluconate Cloth  6 each Topical Q0600  . famotidine  20 mg Per Tube Daily  . heparin  5,000 Units Subcutaneous Q8H  . insulin aspart  0-6 Units Subcutaneous Q4H  . mouth rinse  15 mL Mouth Rinse 10 times per day  . sodium chloride flush  10-40 mL Intracatheter Q12H   . dextrose 5 % and 0.45% NaCl 50 mL/hr at October 14, 2020 1155  . magnesium sulfate bolus IVPB 2 g (2020/10/14 1149)  . norepinephrine (LEVOPHED) Adult infusion 1 mcg/min (10-14-2020 0700)   dextrose, docusate sodium, polyethylene glycol, sodium chloride flush

## 2020-09-25 DEATH — deceased

## 2021-02-18 ENCOUNTER — Ambulatory Visit: Payer: Medicare Other | Admitting: Student

## 2021-09-05 IMAGING — DX DG FOOT COMPLETE 3+V*L*
3 series · 3 of 3 positions shown · non-contrast
Comparison: June 02, 2016.

CLINICAL DATA: Left foot wound.

EXAM:
LEFT FOOT - COMPLETE 3+ VIEW

[x foot ap left]
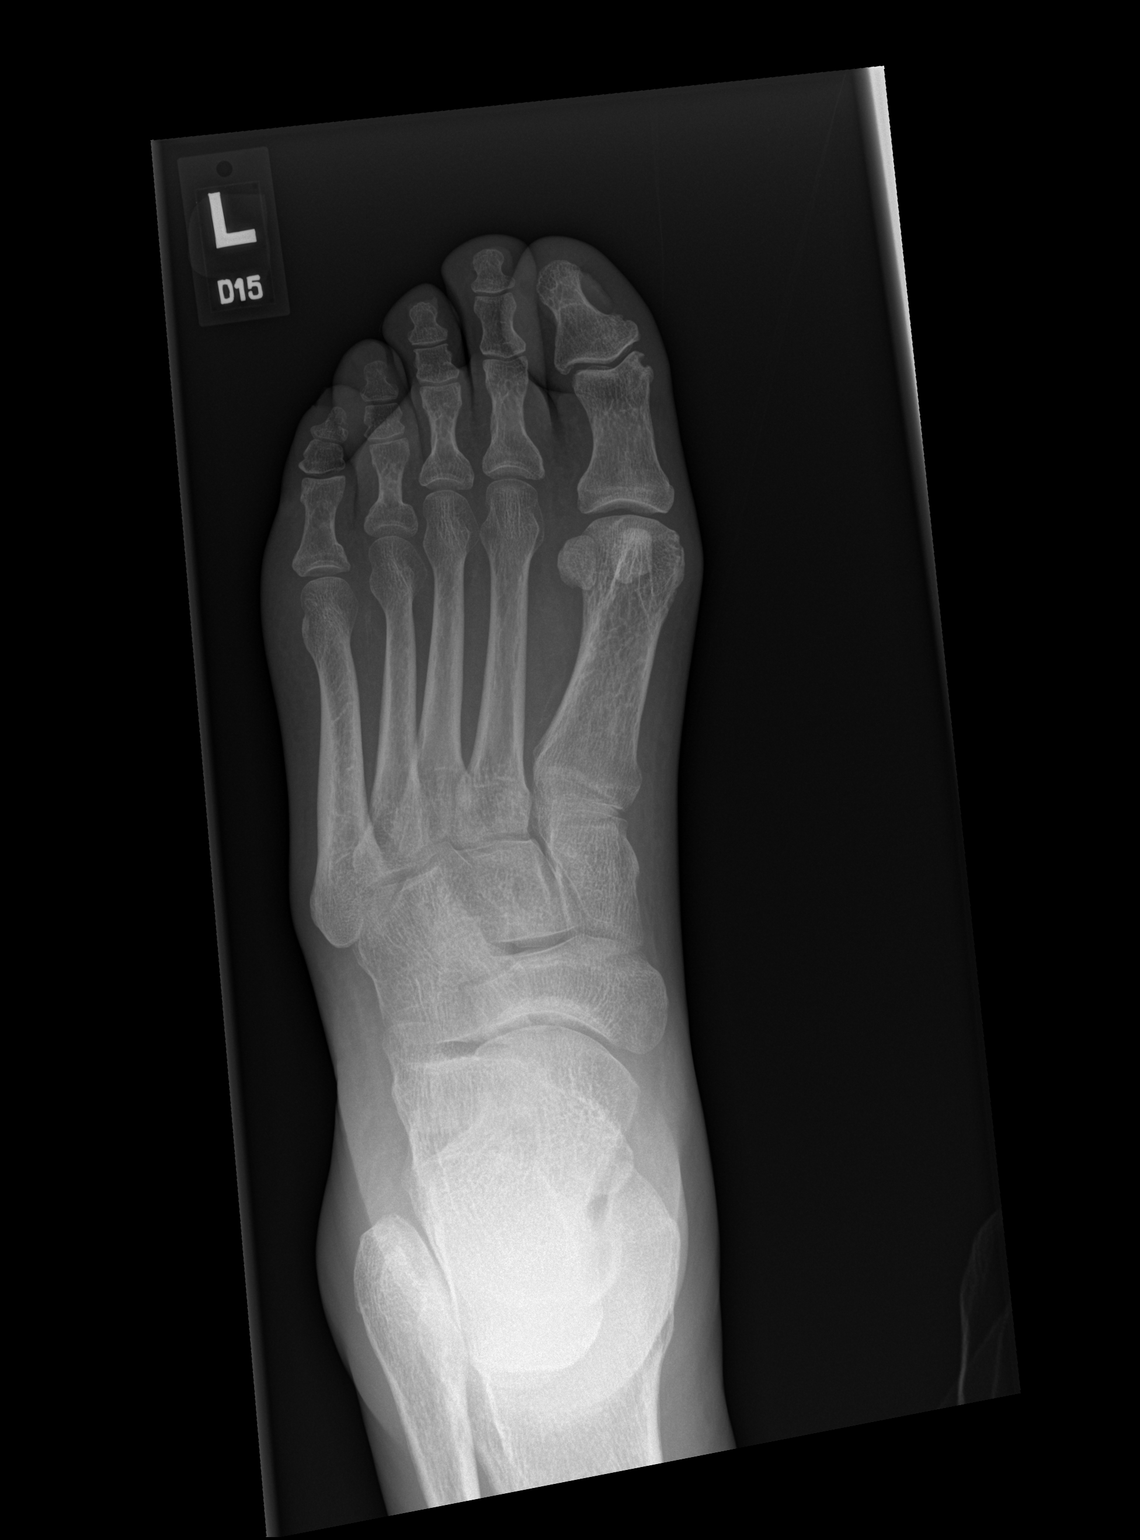

[x foot obl left]
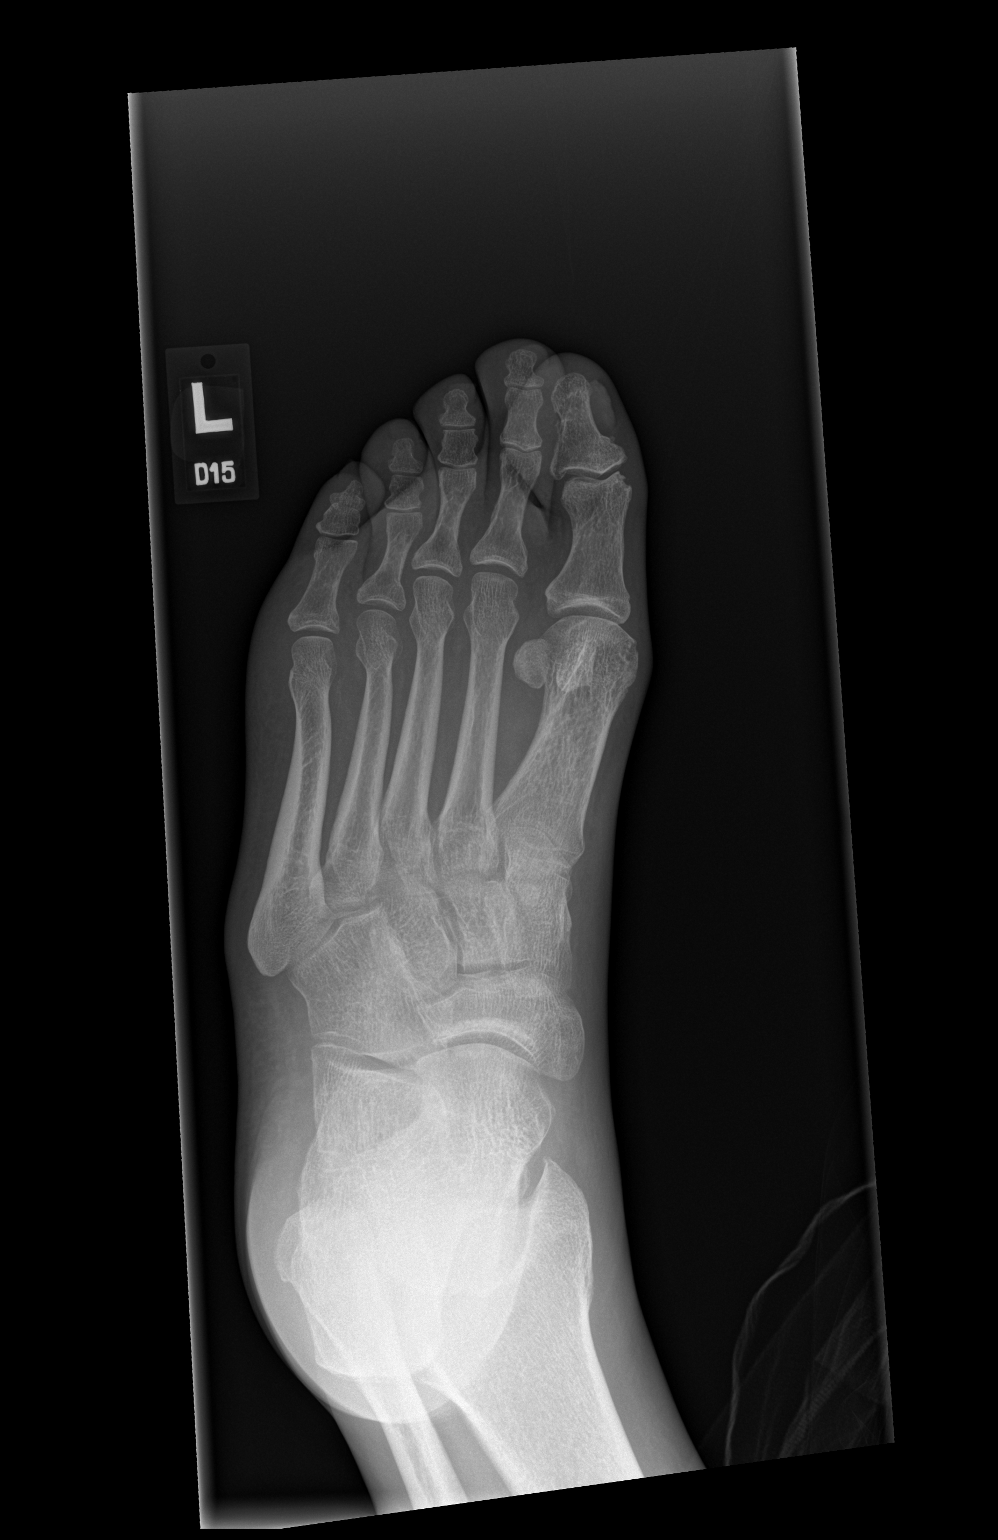

[x foot lat left]
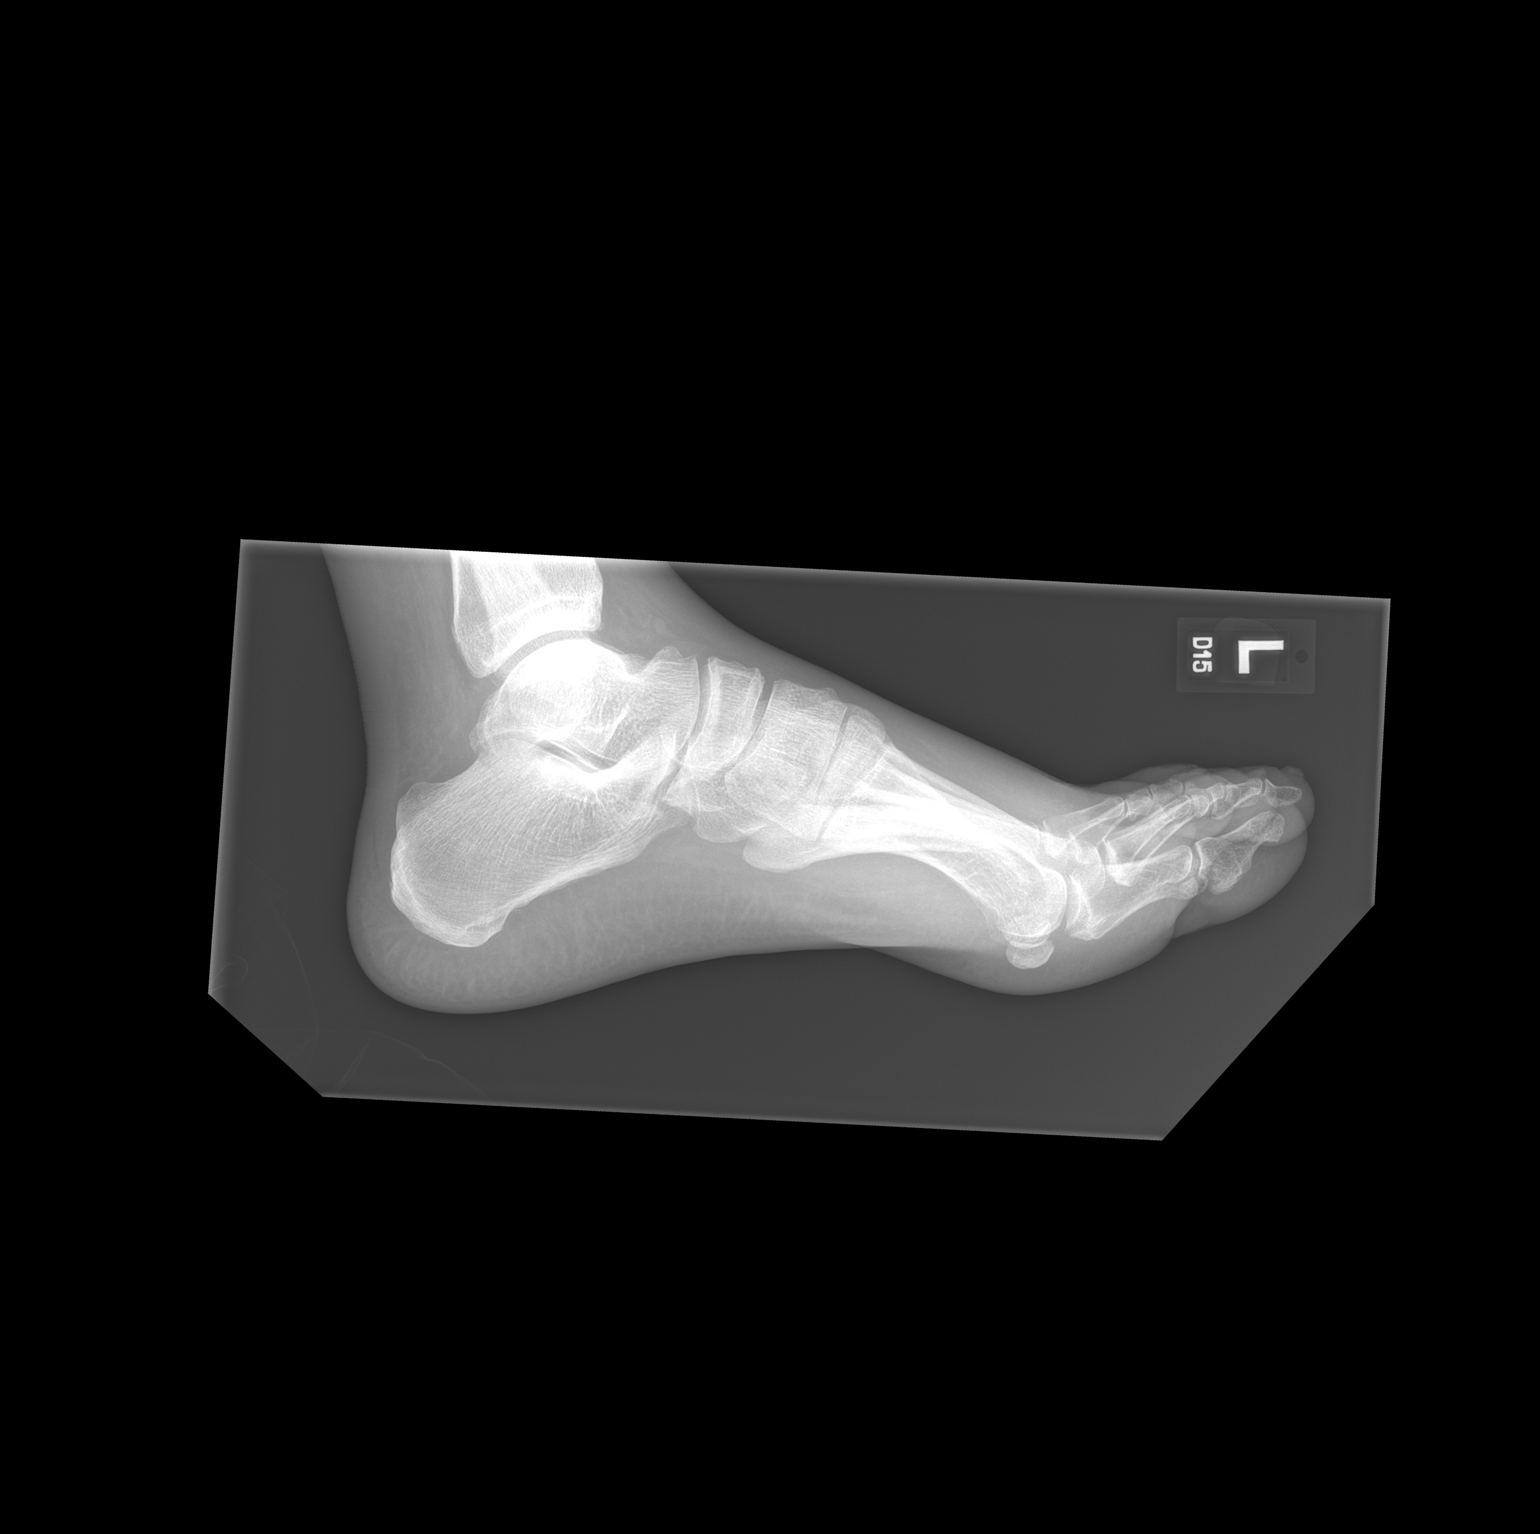

[3 of 3 positions shown; findings below may reference images not displayed]

FINDINGS: There is no evidence of fracture or dislocation. There is no
evidence of arthropathy or other focal bone abnormality. Soft
tissues are unremarkable.
IMPRESSION: Negative.

## 2022-10-25 IMAGING — DX DG CHEST 1V PORT
1 series · 1 of 1 positions shown · non-contrast
Comparison: 05/11/2020

CLINICAL DATA: 54-year-old male with altered mental status.

EXAM:
PORTABLE CHEST 1 VIEW

[chest]
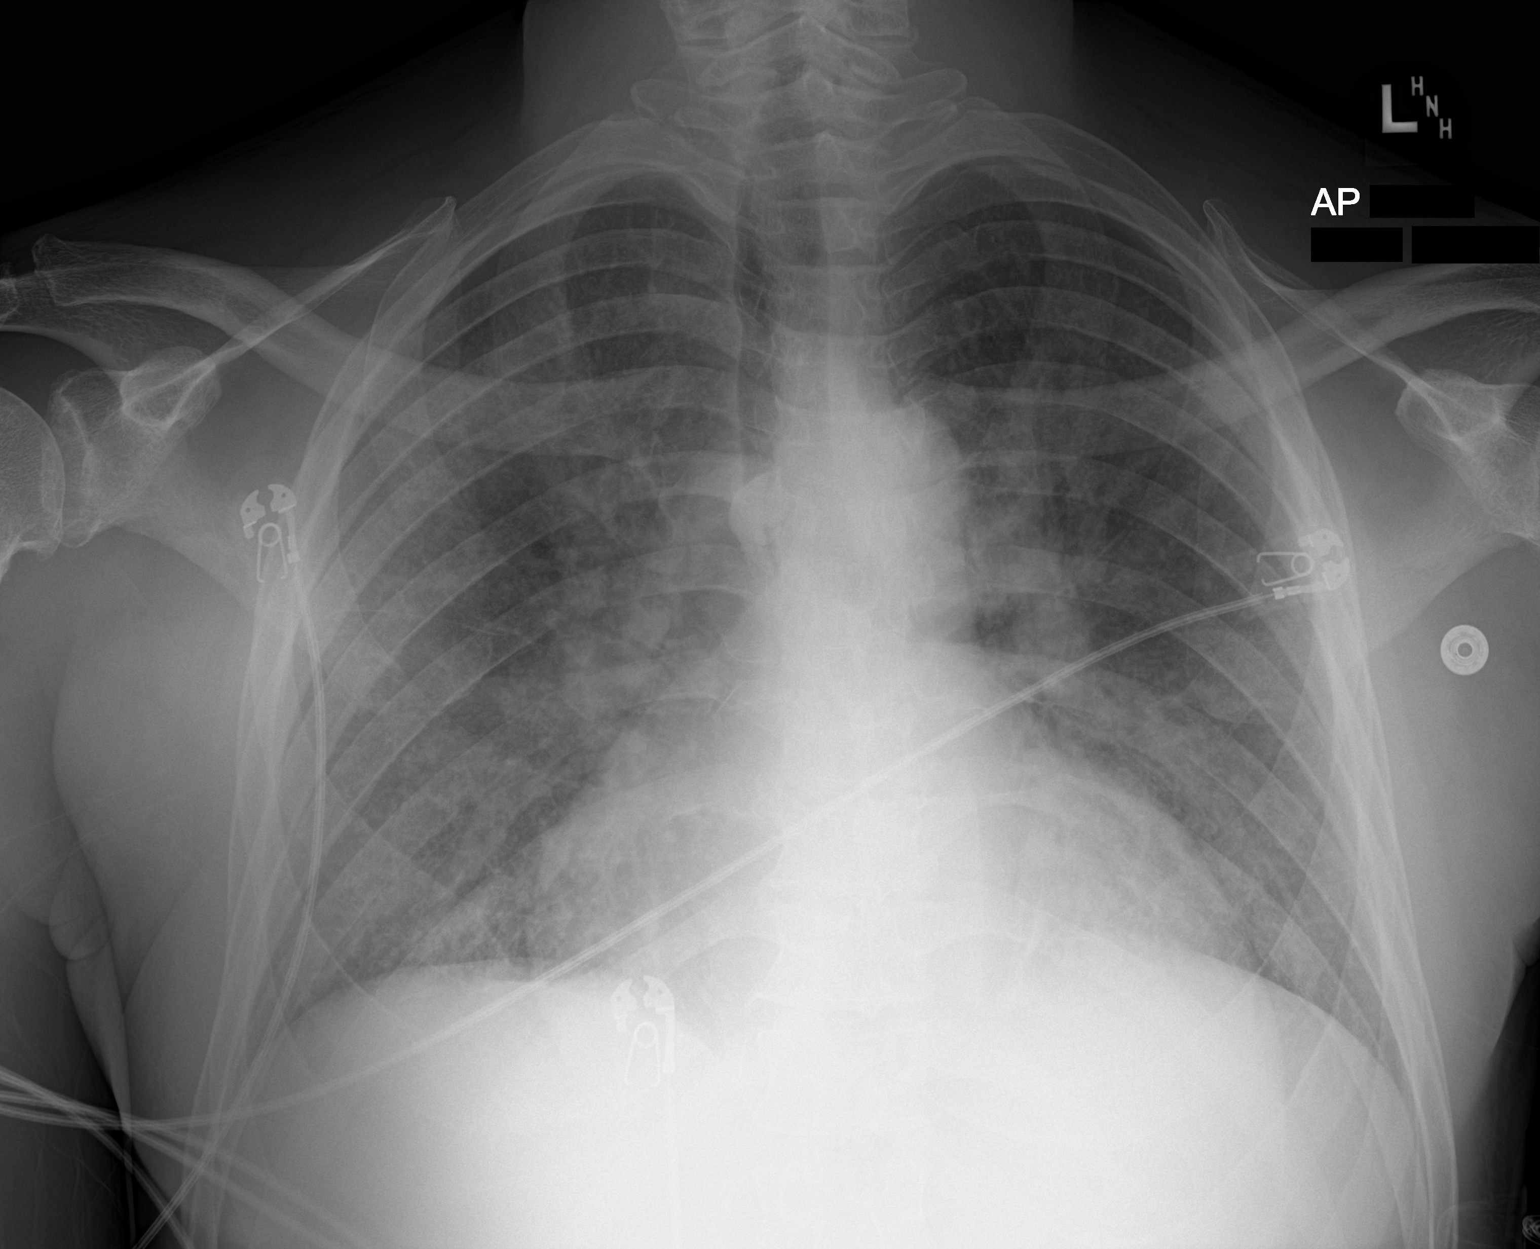

[1 of 1 positions shown; findings below may reference images not displayed]

FINDINGS: Similar appearing moderate cardiomegaly. Cephalization of pulmonary
vasculature. Scattered, basal predominant patchy pulmonary
opacities. No pleural effusion or pneumothorax. No acute osseous
abnormality.
IMPRESSION: Mild pulmonary edema, likely cardiogenic given similar appearing
moderate cardiomegaly.
